# Patient Record
Sex: Female | Born: 1950 | Race: Black or African American | Hispanic: No | Marital: Married | Smoking: Never smoker
Health system: Southern US, Community
[De-identification: ages and names within clinical notes are randomized; demographics above are authoritative.]

## PROBLEM LIST (undated history)

## (undated) DIAGNOSIS — R5383 Other fatigue: Secondary | ICD-10-CM

## (undated) DIAGNOSIS — Z634 Disappearance and death of family member: Secondary | ICD-10-CM

## (undated) DIAGNOSIS — M2550 Pain in unspecified joint: Secondary | ICD-10-CM

## (undated) DIAGNOSIS — M549 Dorsalgia, unspecified: Secondary | ICD-10-CM

## (undated) DIAGNOSIS — E785 Hyperlipidemia, unspecified: Secondary | ICD-10-CM

## (undated) DIAGNOSIS — I10 Essential (primary) hypertension: Secondary | ICD-10-CM

## (undated) DIAGNOSIS — I2510 Atherosclerotic heart disease of native coronary artery without angina pectoris: Secondary | ICD-10-CM

## (undated) DIAGNOSIS — E119 Type 2 diabetes mellitus without complications: Secondary | ICD-10-CM

## (undated) DIAGNOSIS — M1990 Unspecified osteoarthritis, unspecified site: Secondary | ICD-10-CM

## (undated) DIAGNOSIS — E079 Disorder of thyroid, unspecified: Secondary | ICD-10-CM

## (undated) DIAGNOSIS — R0602 Shortness of breath: Secondary | ICD-10-CM

## (undated) DIAGNOSIS — E039 Hypothyroidism, unspecified: Secondary | ICD-10-CM

## (undated) HISTORY — DX: Atherosclerotic heart disease of native coronary artery without angina pectoris: I25.10

## (undated) HISTORY — DX: Disorder of thyroid, unspecified: E07.9

## (undated) HISTORY — DX: Pain in unspecified joint: M25.50

## (undated) HISTORY — DX: Hyperlipidemia, unspecified: E78.5

## (undated) HISTORY — DX: Shortness of breath: R06.02

## (undated) HISTORY — DX: Other fatigue: R53.83

## (undated) HISTORY — DX: Essential (primary) hypertension: I10

## (undated) HISTORY — DX: Dorsalgia, unspecified: M54.9

## (undated) HISTORY — DX: Disappearance and death of family member: Z63.4

## (undated) HISTORY — DX: Unspecified osteoarthritis, unspecified site: M19.90

## (undated) HISTORY — DX: Hypothyroidism, unspecified: E03.9

## (undated) HISTORY — PX: CARDIAC CATHETERIZATION: SHX172

## (undated) HISTORY — PX: THYROIDECTOMY: SHX17

## (undated) HISTORY — PX: CHOLECYSTECTOMY: SHX55

---

## 1972-12-20 HISTORY — PX: APPENDECTOMY: SHX54

## 1981-12-20 DIAGNOSIS — Z634 Disappearance and death of family member: Secondary | ICD-10-CM

## 1981-12-20 HISTORY — DX: Disappearance and death of family member: Z63.4

## 1985-12-20 HISTORY — PX: LAPAROSCOPIC TUBAL LIGATION: SUR803

## 1999-11-19 ENCOUNTER — Other Ambulatory Visit: Admission: RE | Admit: 1999-11-19 | Discharge: 1999-11-19 | Payer: Self-pay | Admitting: Internal Medicine

## 2001-09-14 ENCOUNTER — Encounter: Payer: Self-pay | Admitting: Emergency Medicine

## 2001-09-14 ENCOUNTER — Encounter: Payer: Self-pay | Admitting: Cardiology

## 2001-09-14 ENCOUNTER — Inpatient Hospital Stay (HOSPITAL_COMMUNITY): Admission: EM | Admit: 2001-09-14 | Discharge: 2001-09-15 | Payer: Self-pay | Admitting: Emergency Medicine

## 2001-09-18 ENCOUNTER — Encounter: Payer: Self-pay | Admitting: Cardiology

## 2001-09-18 ENCOUNTER — Ambulatory Visit (HOSPITAL_COMMUNITY): Admission: RE | Admit: 2001-09-18 | Discharge: 2001-09-18 | Payer: Self-pay | Admitting: Cardiology

## 2002-11-10 ENCOUNTER — Encounter: Payer: Self-pay | Admitting: Emergency Medicine

## 2002-11-10 ENCOUNTER — Inpatient Hospital Stay (HOSPITAL_COMMUNITY): Admission: EM | Admit: 2002-11-10 | Discharge: 2002-11-12 | Payer: Self-pay | Admitting: Emergency Medicine

## 2002-11-11 ENCOUNTER — Encounter (INDEPENDENT_AMBULATORY_CARE_PROVIDER_SITE_OTHER): Payer: Self-pay | Admitting: *Deleted

## 2002-11-11 ENCOUNTER — Encounter: Payer: Self-pay | Admitting: General Surgery

## 2003-07-11 ENCOUNTER — Encounter: Payer: Self-pay | Admitting: Emergency Medicine

## 2003-07-11 ENCOUNTER — Emergency Department (HOSPITAL_COMMUNITY): Admission: AD | Admit: 2003-07-11 | Discharge: 2003-07-11 | Payer: Self-pay | Admitting: Emergency Medicine

## 2004-12-09 ENCOUNTER — Encounter: Admission: RE | Admit: 2004-12-09 | Discharge: 2004-12-09 | Payer: Self-pay | Admitting: Internal Medicine

## 2006-12-07 ENCOUNTER — Ambulatory Visit (HOSPITAL_COMMUNITY): Admission: RE | Admit: 2006-12-07 | Discharge: 2006-12-07 | Payer: Self-pay | Admitting: Emergency Medicine

## 2007-03-18 ENCOUNTER — Emergency Department (HOSPITAL_COMMUNITY): Admission: EM | Admit: 2007-03-18 | Discharge: 2007-03-18 | Payer: Self-pay | Admitting: Emergency Medicine

## 2007-03-18 ENCOUNTER — Ambulatory Visit (HOSPITAL_COMMUNITY): Admission: RE | Admit: 2007-03-18 | Discharge: 2007-03-18 | Payer: Self-pay | Admitting: Emergency Medicine

## 2007-06-24 ENCOUNTER — Inpatient Hospital Stay (HOSPITAL_COMMUNITY): Admission: EM | Admit: 2007-06-24 | Discharge: 2007-06-25 | Payer: Self-pay | Admitting: Emergency Medicine

## 2010-02-02 ENCOUNTER — Emergency Department (HOSPITAL_COMMUNITY): Admission: EM | Admit: 2010-02-02 | Discharge: 2010-02-02 | Payer: Self-pay | Admitting: Emergency Medicine

## 2010-03-25 ENCOUNTER — Ambulatory Visit (HOSPITAL_COMMUNITY): Admission: RE | Admit: 2010-03-25 | Discharge: 2010-03-25 | Payer: Self-pay | Admitting: Gastroenterology

## 2010-10-22 ENCOUNTER — Emergency Department (HOSPITAL_COMMUNITY): Admission: EM | Admit: 2010-10-22 | Discharge: 2010-10-22 | Payer: Self-pay | Admitting: Emergency Medicine

## 2010-12-24 LAB — COMPREHENSIVE METABOLIC PANEL
ALT: 22 U/L (ref 0–35)
AST: 19 U/L (ref 0–37)
Albumin: 4.2 g/dL (ref 3.5–5.2)
Alkaline Phosphatase: 88 U/L (ref 39–117)
BUN: 9 mg/dL (ref 6–23)
CO2: 30 mEq/L (ref 19–32)
Calcium: 9.7 mg/dL (ref 8.4–10.5)
Chloride: 99 mEq/L (ref 96–112)
Creatinine, Ser: 0.69 mg/dL (ref 0.4–1.2)
GFR calc Af Amer: >60 mL/min (ref >60)
GFR calc non Af Amer: >60 mL/min (ref >60)
Glucose, Bld: 101 mg/dL — ABNORMAL HIGH (ref 70–99)
Potassium: 3.6 mEq/L (ref 3.5–5.1)
Sodium: 137 mEq/L (ref 135–145)
Total Bilirubin: 0.8 mg/dL (ref 0.3–1.2)
Total Protein: 7.3 g/dL (ref 6.0–8.3)

## 2010-12-24 LAB — DIFFERENTIAL
Basophils Absolute: 0 10*3/uL (ref 0.0–0.1)
Basophils Relative: 0 % (ref 0–1)
Eosinophils Absolute: 0 10*3/uL (ref 0.0–0.7)
Eosinophils Relative: 1 % (ref 0–5)
Lymphocytes Relative: 54 % — ABNORMAL HIGH (ref 12–46)
Lymphs Abs: 2.7 10*3/uL (ref 0.7–4.0)
Monocytes Absolute: 0.4 10*3/uL (ref 0.1–1.0)
Monocytes Relative: 9 % (ref 3–12)
Neutro Abs: 1.8 10*3/uL (ref 1.7–7.7)
Neutrophils Relative %: 36 % — ABNORMAL LOW (ref 43–77)

## 2010-12-24 LAB — URINE MICROSCOPIC-ADD ON

## 2010-12-24 LAB — CBC
HCT: 36.8 % (ref 36.0–46.0)
Hemoglobin: 12.1 g/dL (ref 12.0–15.0)
MCH: 29.4 pg (ref 26.0–34.0)
MCHC: 32.9 g/dL (ref 30.0–36.0)
MCV: 89.5 fL (ref 78.0–100.0)
Platelets: 362 10*3/uL (ref 150–400)
RBC: 4.11 MIL/uL (ref 3.87–5.11)
RDW: 13.6 % (ref 11.5–15.5)
WBC: 4.9 10*3/uL (ref 4.0–10.5)

## 2010-12-24 LAB — PROTIME-INR
INR: 1.05 (ref 0.00–1.49)
Prothrombin Time: 13.9 seconds (ref 11.6–15.2)

## 2010-12-24 LAB — URINALYSIS, ROUTINE W REFLEX MICROSCOPIC
Bilirubin Urine: NEGATIVE
Ketones, ur: NEGATIVE mg/dL
Leukocytes, UA: NEGATIVE
Nitrite: NEGATIVE
Protein, ur: NEGATIVE mg/dL
Specific Gravity, Urine: 1.014 (ref 1.005–1.030)
Urine Glucose, Fasting: NEGATIVE mg/dL
Urobilinogen, UA: 1 mg/dL (ref 0.0–1.0)
pH: 6 (ref 5.0–8.0)

## 2010-12-28 ENCOUNTER — Ambulatory Visit (HOSPITAL_COMMUNITY)
Admission: RE | Admit: 2010-12-28 | Discharge: 2010-12-29 | Payer: Self-pay | Source: Home / Self Care | Attending: Surgery | Admitting: Surgery

## 2010-12-28 ENCOUNTER — Encounter (INDEPENDENT_AMBULATORY_CARE_PROVIDER_SITE_OTHER): Payer: Self-pay | Admitting: Surgery

## 2011-01-04 LAB — CALCIUM
Calcium: 9 mg/dL (ref 8.4–10.5)
Calcium: 9 mg/dL (ref 8.4–10.5)

## 2011-01-10 NOTE — Op Note (Signed)
NAMEMarland Kitchen  Deborah [name], Deborah [name] NO.:  [id number]  MEDICAL RECORD NO.:  [id number]          PATIENT TYPE:  OIB  LOCATION:  5121                         FACILITY:  [facility]  PHYSICIAN:  Velora Heckler, MD      DATE OF BIRTH:  1951-07-07  DATE OF PROCEDURE:  12/28/2010 DATE OF DISCHARGE:                              OPERATIVE REPORT   PREOPERATIVE DIAGNOSIS:  Substernal thyroid goiter with compressive symptoms.  POSTOPERATIVE DIAGNOSIS:  Substernal thyroid goiter with compressive symptoms.  PROCEDURE:  Total thyroidectomy.  SURGEON:  Velora Heckler, MD  ASSISTANT:  Anselm Pancoast. Zachery Dakins, MD  ANESTHESIA:  General endotracheal per Dr. Claybon Jabs.  ESTIMATED BLOOD LOSS:  Minimal.  PREPARATION:  ChloraPrep.  COMPLICATIONS:  None.  INDICATIONS:  The patient is a 60 year old black female from [place], [place].  She was diagnosed by Dr. Andi Devon with substernal thyroid goiter with compressive symptoms.  The patient has had exertional dyspnea.  CT scan of the neck and chest showed a large substernal goiter in the left thyroid lobe extending approximately 9 cm and displacing the trachea to the right.  The patient was evaluated by General Surgery and by Anesthesia preoperatively.  She now comes to surgery for thyroidectomy.  BODY OF REPORT:  Procedure was done in OR #11 at the [facility].  The patient was brought to the operating room, placed in a supine position on the operating room table.  Following administration of general anesthesia, the patient was positioned and then prepped and draped in usual strict aseptic fashion.  After ascertaining that an adequate level of anesthesia had been achieved, a Kocher incision was made with a #10 blade.  Dissection was carried through subcutaneous tissues and platysma.  Hemostasis was obtained with electrocautery.  External jugular vein was divided between hemostats and ligated with 2-0 silk  ties.  Subplatysmal flaps were elevated cephalad and caudad from the thyroid notch to the sternal notch.  A Mahorner self- retaining retractor was placed for exposure.  Strap muscles were incised in the midline and dissection was begun on the left side.  Strap muscles were reflected laterally exposing a markedly enlarged left thyroid lobe. Lobe was gently mobilized with blunt dissection.  Venous tributaries were divided between medium Ligaclips with a harmonic scalpel.  Gland was rolled anteriorly.  Superior pole was dissected out and superior pole vessels were divided individually between medium and small Ligaclips with the harmonic scalpel.  Gland is was further anteriorly and inferior venous tributaries were divided between medium Ligaclips with the harmonic scalpel.  Care was taken to identify and preserve the parathyroid glands on the left.  Branches of the inferior thyroid artery were divided between small and medium Ligaclips with the harmonic scalpel.  Recurrent laryngeal nerve was identified and preserved.  Gland was mobilized down to the trachea.  The ligament of Allyson Sabal was transected with electrocautery and the gland was mobilized up and onto the anterior trachea.  The trachea was deformed by the enlarged thyroid gland. However, the cartilaginous rings appeared to be present and structurally sound.  There was no significant pyramidal lobe.  The  isthmus was mobilized across the midline with electrocautery.  A dry pack was placed in the left neck.  Next, we turned our attention to the right side.  Right strap muscles were reflected off the right thyroid lobe, which was in the upper limits of normal size.  No dominant nor discrete nodules.  Right lobe was fully exposed.  Venous tributaries were divided between medium Ligaclips with the harmonic scalpel. Superior pole was dissected out and superior pole vessels were divided between small and medium Ligaclips with the harmonic  scalpel.  Superior parathyroid gland was identified and preserved.  Inferior venous tributaries were divided between Ligaclips with harmonic scalpel.  The inferior parathyroid gland on the right was identified and preserved. Branches of the inferior thyroid artery were divided between small Ligaclips.  Recurrent nerve was identified and preserved.  Ligament of Allyson Sabal was released with the electrocautery taking care to avoid the recurrent nerve.  Gland was then resected from the anterior trachea with the electrocautery.  Suture was used to mark the right superior pole. The entire thyroid gland was submitted to Pathology for review.  Neck was irrigated with warm saline.  Good hemostasis was achieved bilaterally.  Surgicel was placed in the operative field bilaterally. Strap muscles were reapproximated in the midline with interrupted 3-0 Vicryl sutures.  Platysma muscle was closed with interrupted 3-0 Vicryl sutures.  Skin was closed with a running 4-0 Monocryl subcuticular suture.  Wound was washed and dried and Benzoin and Steri-Strips were applied.  Sterile dressings were applied.  The patient was awakened from anesthesia and brought to the recovery room.  The patient tolerated the procedure well.     Velora Heckler, MD     TMG/MEDQ  D:  12/28/2010  T:  12/29/2010  Job:  [id number]  cc:   Merlene Laughter. Renae Gloss, M.D.  Electronically Signed by Darnell Level MD on 01/10/2011 09:57:47 AM

## 2011-01-10 NOTE — Discharge Summary (Signed)
  NAMEMarland [name]  ROYLENE, HEATON NO.:  [id number]  MEDICAL RECORD NO.:  [id number]          PATIENT TYPE:  OIB  LOCATION:  5121                         FACILITY:  [facility]  PHYSICIAN:  Velora Heckler, MD      DATE OF BIRTH:  04-08-1951  DATE OF ADMISSION:  12/28/2010 DATE OF DISCHARGE:  12/29/2010                              DISCHARGE SUMMARY   REASON FOR ADMISSION:  Thyroid goiter with compressive symptoms.  BRIEF HISTORY:  The patient is a 60 year old black female from [place], [place].  She presents at the request of Dr. Mikeal Hawthorne with substernal thyroid goiter and shortness of breath. Evaluation showed a large thyroid goiter on the left, extending into the anterior mediastinum with displacement of the trachea to the right.  The patient now comes to surgery for resection.  HOSPITAL COURSE:  The patient was admitted on the surgical service and taken directly to the operating room.  She underwent total thyroidectomy.  Postoperative course was straightforward.  Serum calcium levels remained stable.  Voice quality was good.  The patient was observed overnight.  She tolerated a diet.  She was prepared for discharge home on the first postoperative day.  DISCHARGE PLANNING:  The patient is discharged home on December 29, 2010, in good condition, tolerating a regular diet, and ambulating independently.  Discharge medications include Vicodin as needed for pain.  The patient will start on Synthroid 100 mcg daily.  She will also take two Tums three times daily for 2 weeks.  The patient will be seen back in my office at [facility] in 2 weeks.  We will check a calcium level prior to her office visit.  FINAL DIAGNOSIS:  Substernal thyroid goiter with compressive symptoms, final pathology pending at the time of discharge.  CONDITION AT DISCHARGE:  Good.     Velora Heckler, MD     TMG/MEDQ  D:  12/29/2010  T:  12/29/2010  Job:  [id number]  cc:    Merlene Laughter. Renae Gloss, M.D.  Electronically Signed by Darnell Level MD on 01/10/2011 09:57:42 AM

## 2011-01-14 LAB — SURGICAL PCR SCREEN
MRSA, PCR: NEGATIVE
Staphylococcus aureus: NEGATIVE

## 2011-03-03 LAB — POCT I-STAT, CHEM 8
BUN: 11 mg/dL (ref 6–23)
Calcium, Ion: 1.16 mmol/L (ref 1.12–1.32)
Chloride: 103 mEq/L (ref 96–112)
Creatinine, Ser: 0.8 mg/dL (ref 0.4–1.2)
Glucose, Bld: 111 mg/dL — ABNORMAL HIGH (ref 70–99)
HCT: 38 % (ref 36.0–46.0)
Hemoglobin: 12.9 g/dL (ref 12.0–15.0)
Potassium: 3.4 mEq/L — ABNORMAL LOW (ref 3.5–5.1)
Sodium: 140 mEq/L (ref 135–145)
TCO2: 28 mmol/L (ref 0–100)

## 2011-03-03 LAB — GLUCOSE, CAPILLARY: Glucose-Capillary: 115 mg/dL — ABNORMAL HIGH (ref 70–99)

## 2011-03-12 LAB — CBC
HCT: 33.4 % — ABNORMAL LOW (ref 36.0–46.0)
Hemoglobin: 11.4 g/dL — ABNORMAL LOW (ref 12.0–15.0)
RBC: 3.77 MIL/uL — ABNORMAL LOW (ref 3.87–5.11)
RDW: 14.2 % (ref 11.5–15.5)

## 2011-03-12 LAB — DIFFERENTIAL
Eosinophils Relative: 0 % (ref 0–5)
Lymphocytes Relative: 25 % (ref 12–46)
Lymphs Abs: 1.8 10*3/uL (ref 0.7–4.0)
Neutro Abs: 4.8 10*3/uL (ref 1.7–7.7)

## 2011-03-12 LAB — PROTIME-INR
INR: 1.05 (ref 0.00–1.49)
Prothrombin Time: 13.6 seconds (ref 11.6–15.2)

## 2011-03-12 LAB — CK TOTAL AND CKMB (NOT AT ARMC): Relative Index: 1.1 (ref 0.0–2.5)

## 2011-03-12 LAB — BASIC METABOLIC PANEL
CO2: 27 mEq/L (ref 19–32)
GFR calc non Af Amer: >60 mL/min (ref >60)
Glucose, Bld: 124 mg/dL — ABNORMAL HIGH (ref 70–99)
Potassium: 3.2 mEq/L — ABNORMAL LOW (ref 3.5–5.1)
Sodium: 132 mEq/L — ABNORMAL LOW (ref 135–145)

## 2011-03-12 LAB — TROPONIN I: Troponin I: 0.02 ng/mL (ref 0.00–0.06)

## 2011-03-12 LAB — GLUCOSE, CAPILLARY: Glucose-Capillary: 125 mg/dL — ABNORMAL HIGH (ref 70–99)

## 2011-03-12 LAB — APTT: aPTT: 25 seconds (ref 24–37)

## 2011-03-12 LAB — D-DIMER, QUANTITATIVE: D-Dimer, Quant: <0.22 ug/mL-FEU (ref 0.00–0.48)

## 2011-05-04 NOTE — Discharge Summary (Signed)
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  She came in to the emergency  room at about 2:30 p.m. with complaints of sharp pain in the left breast  through to her back, left trapezius and neck.  She had no numbness or  tingling in her fingers.  Most of her discomfort was in her upper arm.  She had no relief of pain after sublingual nitroglycerin.  Also gave her  morphine without any relief.  She recently was in the ER trying to help  and she was pushing several stretchers.  She has never had this  discomfort before.  She does have a family history of early coronary  disease with her mother having a MI in her early 25s, and her father  having an active MI.  She was seen by Dr. Elsie Lincoln who decided to admit  her and rule her out.  The following day, on June 25, 2007, her CK MB and  troponin were all negative.  Her EKG was unremarkable except for non-  specific T wave abnormalities.  We did order cervical spine films,  however, they had not been done as of June 25, 2007.  These are pending.  She should have them the morning of her discharge.  Her blood pressure  is 125/74, heart rate was 76.  She was sinus rhythm 83.  We had given  her some Toradol.  She states that is the medication that helped her  discomfort.  It was felt that her discomfort was musculoskeletal.  Her  discharge is pending results of the cervical spine film.  Dr. Elsie Lincoln  feels she can return to work after being pain free for at least 24 hours  and she is to do no pushing or lifting for 5 days after  she has been  pain free.   DISCHARGE MEDICATIONS:  1. Celebrex 200 mg a day for five days.  2. Diovan 320/25 a day.  3. Tekturna 150 mg a day.  4. Enteric-coated aspirin 81 mg a day.  5. Crestor 10 mg a day.   She can return to work after being pain free at least 24 hours.  No  pushing or pulling for 5 days after pain is gone.  She will need fasting  lipids and LFTs checked in 2 months after starting her Crestor.   LABORATORY DATA:  Sodium 138, potassium 3.6, BUN 10.  Her total  cholesterol is 176, LDL is 131, HDL is 33, her triglycerides are 61.  D-  dimer was 0.26.  TSH is 1.990.   DISCHARGE DIAGNOSES:  1. Musculoskeletal pain.  2. Dyslipidemia with low HDL and high LDL.  3. Premature family  history for heart disease.  4. Hypertension.   She should follow up with her primary care doctor.  Follow up with Dr.  Elsie Lincoln on a p.r.n. basis.      [name], N.P.    ______________________________  [name], M.D.    BB/MEDQ  D:  06/25/2007  T:  06/25/2007  Job:  [id number]

## 2011-05-07 NOTE — Cardiovascular Report (Signed)
[place]  Patient:    Deborah [name], Deborah [name] Visit Number: [id number] MRN: [id number]          Service Type: MED Location: [phone] Attending Physician:  Ophelia Shoulder Dictated by:   [name], M.D. Proc. Date: 09/15/01 Admit Date:  09/14/2001                          Cardiac Catheterization  PROCEDURES PERFORMED: 1. Selective coronary angiography by Judkins technique. 2. Retrograde left heart catheterization. 3. Left ventricular angiography. 4. Abdominal aortography.  COMPLICATIONS: None.  ENTRY SITE: Right femoral.  DYE USED: Omnipaque.  PATIENT PROFILE: The patient is a 60 year old, African-American, married, female, who is a Armed forces technical officer at [employer].  She has a family history of coronary artery disease and has had previous chest pain with a normal catheterization.  Recently, she had a throat and neck discomfort that was alarming to her and presented on September 14, 2001, for admission. Cardiac enzymes and ECGs have been negative, however. Actually, her ECG did show some nonspecific ST-T abnormalities.  Todays inpatient cardiac catheterization is done electively.  RESULTS:  PRESSURES: The left ventricular pressure was 120/10, central aortic pressure 120/70, mean of 90.  No aortic valve gradient by pullback technique.  ANGIOGRAPHIC RESULTS: The left main coronary artery is a short vessel that is virtually a common ostium to the circumflex and LAD.  No lesions are seen. The LAD is a normal vessel giving rise to one medium sized diagonal branch arising before the first septal perforator branch and a second tiny diagonal branch also arising near the first septal perforator branch.  No lesions are seen in the two diagonals over the LAD.  The circumflex coronary artery gives rise to a very large obtuse marginal branch.  The circumflex itself coursed though the atrial ventricular  groove and terminates as a posterior descending branch.  No lesions are seen.  There is an intermediate ramus branch, which is fairly small which is also normal.  The right coronary artery is intubated by using a No Torque right diagnostic catheter.  It has a shepherds Office manager. It is a small nondominant vessel that is normal.  LEFT VENTRICULAR ANGIOGRAM:  Left ventricular angiogram shows normal contractility of all wall segments. No LV thrombus and no evidence of prolapse or mitral regurgitation.  The abdominal aortogram shows that her renal arteries are normal (the patient has hypertension).  The abdominal aorta is smooth and normal.  FINAL DIAGNOSES: 1. Angiographically patent and normal coronary arteries. 2. Normal left ventricular systolic function. 3. No evidence of pathology in the abdominal aorta or the renal arteries.  PLAN: Reassurance. The patient should have GI work-up if the patients symptoms return. Dictated by:   [name], M.D. Attending Physician:  Ophelia Shoulder DD:  09/15/01 TD:  09/15/01 Job: [id number]

## 2011-05-07 NOTE — Op Note (Signed)
NAME:  PEDRO, OLDENBURG                      ACCOUNT NO.:  [id number]   MEDICAL RECORD NO.:  [id number]                   PATIENT TYPE:  INP   LOCATION:  5702                                 FACILITY:  [facility]   PHYSICIAN:  Ollen Gross. Vernell Morgans, M.D.              DATE OF BIRTH:  [birth year]   DATE OF PROCEDURE:  11/11/2002  DATE OF DISCHARGE:  11/12/2002                                 OPERATIVE REPORT   PREOPERATIVE DIAGNOSIS:  Cholelithiasis.   POSTOPERATIVE DIAGNOSIS:  Cholelithiasis.   PROCEDURE:  Laparoscopic cholecystectomy with intraoperative cholangiogram.   SURGEON:  Ollen Gross. Carolynne Edouard, M.D.   ASSISTANT:  Jimmye Norman, M.D.   ANESTHESIA:  General endotracheal.   DESCRIPTION OF PROCEDURE:  After informed consent was obtained, the patient  was brought to the operating room and placed in the supine position upon the  operating table.  After adequate induction of general anesthesia, the  patient's abdomen was prepped with Betadine and draped in the usual sterile  manner.  The area below the umbilicus was infiltrated with 0.25% Marcaine  and a small incision was made with a 15 blade knife.  This incision was  carried down through the subcutaneous tissue bluntly using a Kelly clamp and  Army-Navy retractors until the linea alba was identified.  The linea alba  was then incised with a 15 blade knife and each side was grasped with Kocher  clamps and elevated anteriorly.  The preperitoneal space was then probed  bluntly with a hemostat until the peritoneum was opened and access was  gained to the abdominal cavity.  A 0 Vicryl pursestring stitch was then  placed in the fascia surrounding this opening.  A Hasson cannula was placed  through this opening and anchored in place with the previously-placed Vicryl  pursestring stitch.  The abdomen was the insufflated with carbon dioxide  without difficulty.  The patient was placed in a head-up position and the  laparoscope was placed through the  Hasson cannula.  The dome of the  gallbladder and liver edge were readily identifiable.  Next, after  infiltrating the area with 0.25% Marcaine, a small upper midline transverse  incision was made with the 15 blade knife.  A 10 mm port was placed bluntly  through this incision into the abdominal cavity under direct vision.  Next  sites were chosen laterally on the right side of the abdomen for placement  of 5 mm ports.  Each of these areas was infiltrated with 0.25% Marcaine and  small stab incisions were made with the 15 blade knife.  Five millimeter  ports were then placed bluntly through these incisions into the abdominal  cavity under direct vision.  A blunt grasper was placed through the  lateralmost 5 mm port and used to grasp the dome of the gallbladder and  elevate it anteriorly and superiorly.  Another blunt grasper was placed  through the other 5 mm port  and used to retract on the body and neck of the  gallbladder.  The dissector was placed through the upper midline port and  using electrocautery, the peritoneal reflection at the gallbladder neck was  opened.  Blunt dissection was then carried out in this area until the  gallbladder neck-cystic duct junction was readily identified and a good  window was created.  A clip was placed on the gallbladder neck.  A 12-gauge  Angiocath was then placed percutaneously through the anterior abdominal  wall, and a Reddick cholangiogram catheter was placed through the Angiocath  and flushed.  A small ductotomy was made on the cystic duct just proximal to  the clip, and the catheter was placed within the cystic duct and anchored in  place with the clip.  The cholangiogram was obtained, and it showed no  filling defects and good emptying into the duodenum.  The anchoring clip and  catheters were then removed from the patient.  Three clips were placed  proximally on the cystic duct and the duct was divided between the two sets  of clips.   Posterior to this the cystic artery was identified and again  dissected in a circumferential manner bluntly until a good window was  created.  Two clips were placed proximally and one distally on the artery,  and the artery was divided between the two.  Next a hook cautery device was  used to separate the gallbladder from the liver bed.  Prior to completely  detaching the gallbladder from the liver bed, the liver bed was inspected  and several bleeding points were coagulated with the electrocautery until  the liver bed was completely hemostatic.  The gallbladder was then detached  the rest of the way from the liver bed.  The laparoscope was then moved to  the upper midline port and a gallbladder grasper was placed through the  Hasson cannula and used to grasp the neck of the gallbladder.  The  gallbladder was then removed with the Hasson cannula through the  infraumbilical port.  The fascial defect was closed with the previously-  placed Vicryl pursestring stitch.  The abdomen was then irrigated with  copious amounts of saline until the effluent was clear.  The ports were then  all removed under direct vision and were found to be hemostatic.  The gas  was allowed to escape, and the skin incisions were closed with interrupted 4-  0 Monocryl subcuticular stitches, Benzoin and Steri-Strips were applied, and  the patient tolerated the procedure well.  At the end of the case all  needle, sponge, and instrument counts were correct.  The patient was  awakened and taken to the recovery room in stable condition.                                               Ollen Gross. Vernell Morgans, M.D.    PST/MEDQ  D:  11/12/2002  T:  11/13/2002  Job:  [id number]

## 2011-05-07 NOTE — Discharge Summary (Signed)
[place]. [facility]  Patient:    Deborah [name], Deborah [name] Visit Number: [id number] MRN: [id number]          Service Type: MED Location: [id number] Attending Physician:  Ophelia Shoulder Dictated by:   [facility]. Admit Date:  09/14/2001 Discharge Date: 09/15/2001                             Discharge Summary  ADMISSION DIAGNOSES: 1. Chest pain, questionable etiology, possible unstable angina. 2. History of cardiac catheterization 1994 which was normal. 3. Hypertension. 4. Chronic bronchitis. 5. Remote history of appendectomy. 6. History of tubal ligation.  DISCHARGE DIAGNOSES: 1. Chest pain, questionable etiology, noncardiac in origin. 2. History of cardiac catheterization 1994 which was normal. 3. Hypertension. 4. Chronic bronchitis. 5. Remote history of appendectomy. 6. History of tubal ligation. 7. Status post cardiac catheterization 09/15/01, by Dr. Chanda Busing which    revealed normal coronary arteries, normal left ventricular function.  HISTORY OF PRESENT ILLNESS:  Ms. Deborah [name] is a 60 year old African-American female, who presented to [facility] ER on 09/14/01, with complaints of right-sided chest pressure, radiating through her chest to the back.  As well, she has a history of complaints of headache.  She states that she was awakened at 4:00 that morning with chest pain, shortness of breath, slight nausea, and a feeling as if her heart was racing.  She did not take any medications.  She rested and thought that it would get better but eventually decided to come to the emergency room.  In the ER, she was given 3 sublingual nitroglycerins, four injections of IV morphine which have not relieved her pain.  On exam she was afebrile with a pulse of 98 to 120, blood pressure 154/83.  There were no significant findings on exam.  EKG showed sinus rhythm, nonspecific changes, with very mild inferolateral ST changes that may  represent ischemia.  First set of cardiac enzymes negative.  LFTs were normal.  Other labs were stable.  HOSPITAL COURSE:  On 09/14/01, she underwent spiral CT which showed no pulmonary embolus.  Later on that night, she continued to complain of nausea, feeling faint and diaphoretic.  Had complaints of "feeling hot inside" and "feeling like something is stuck in my throat."  Later on the morning of 09/15/01, at the time of our interview, she stated that she had been awakened earlier that morning with increased shortness of breath, clamminess, and pressure in her chest that moved up towards her throat.  However, at the time of our rounds at 7:30 a.m., she was much improved.  She was stable and was afebrile at 98.9, pulse 72, blood pressure 115/60.  EKG today continued to show sinus rhythm and is actually improved from yesterday and shows nonspecific changes.  Her cardiac enzymes are negative x 3.  She was planned for cardiac cath later in the day.  On 09/15/01, Ms. Deborah [name] underwent cardiac catheterization by Dr. Lavonne Chick.  Her coronaries were normal.  LV function was normal. Abdominal aorta normal.  At that time, he felt that from a cardiac standpoint, she was stable for discharge home later in the day after bed rest complete. He felt that she may need GI evaluation which could be performed as an outpatient if she wished or we could keep her in the hospital for consultation.  Later in the day, Ms. Deborah [name] is improved and wishes to go home and  have outpatient GI evaluation.  We spoke with Dr. Elsie Lincoln regarding this and decided to schedule her for a follow-up abdominal ultrasound and abdominal series on this upcoming Monday.  Prior to discharge home, she is stable with blood pressure 108/64, heart rate in the 70s-80s.  Her right groin is well-healed with no bleed, no hematoma, and she has 2+ peripheral pulse.  She is now deemed stable for discharge home.  HOSPITAL CONSULTS:   None.  HOSPITAL PROCEDURES:  Cardiac catheterization on 09/15/01 by Dr. Lavonne Chick. Please see his dictated cath report for further details which were unavailable at this time.  Cardiac cath revealed normal coronary arteries, normal LV function, and normal abdominal aorta.  LABORATORY DATA:  Cardiac enzymes negative x 3 with CKs of 128, 98, and 86. MBs 0.8, 0.4, and less than .3.  Troponin .01, less than .01, and less than 0.1.  On 09/14/01. WBC 6/5, hemoglobin 12.1, hematocrit 36.5, platelets 381. Sodium 133, potassium 3.1 which was repleted, BUN 12, creatinine 0.7, glucose 158.  PT 13.0, INR 1.0, PTT 46.  LFTs were all normal.  On 09/15/01, WBC 6.3, hemoglobin 10.9, hematocrit 32.5, platelets 343, sodium 139, potassium 3.4 (she was given K-Dur 40 mEq after this value prior to going to cath), BUN 8, creatinine 0.6, glucose 145.  RADIOLOGY:  Chest CT 09/14/01 shows no evidence of acute pulmonary embolus.  EKG shows normal sinus rhythm, 70 beats per minute, nonspecific ST-T changes with very slight inferolateral ST depression on EKG 09/14/01, which was improved on EKG of 09/15/01.  DISCHARGE MEDICATIONS: 1. Hyzaar 100 mg once a day. 2. Vitamin E 400 IU a day.  ACTIVITY:  No strenuous activity, lifting greater than 5 pounds, driving, or sexual activity for three days.  DIET:  Low salt, low fat, low cholesterol diet.  WOUND CARE:  May gently wash her groin with warm water and soap.  Call the office at [phone] if you have any bleeding or increased size, or pain in the groin.  She has been made an appointment to follow up in the radiology department here at [facility] on Monday, September 30, at 10:30 a.m.  She is to be in admitting at 10:15 a.m.  She has been scheduled for gallbladder ultrasound and an abdominal series.  She has been informed of nothing to eat after midnight night before test.  She has an appointment follow-up with [name] with Dr. Elsie Lincoln in the office  on October 8, at 11:20 a.m. Dictated by:   [facility]. Attending Physician:  Ophelia Shoulder  DD:  09/15/01 TD:  09/15/01 Job: [phone] [id number]

## 2011-06-08 ENCOUNTER — Encounter (INDEPENDENT_AMBULATORY_CARE_PROVIDER_SITE_OTHER): Payer: Self-pay | Admitting: Surgery

## 2011-06-08 DIAGNOSIS — I10 Essential (primary) hypertension: Secondary | ICD-10-CM | POA: Insufficient documentation

## 2011-06-08 DIAGNOSIS — R0602 Shortness of breath: Secondary | ICD-10-CM | POA: Insufficient documentation

## 2011-06-08 DIAGNOSIS — Z780 Asymptomatic menopausal state: Secondary | ICD-10-CM | POA: Insufficient documentation

## 2011-06-08 DIAGNOSIS — R634 Abnormal weight loss: Secondary | ICD-10-CM | POA: Insufficient documentation

## 2011-06-08 DIAGNOSIS — R519 Headache, unspecified: Secondary | ICD-10-CM | POA: Insufficient documentation

## 2011-10-05 LAB — I-STAT 8, (EC8 V) (CONVERTED LAB)
Bicarbonate: 28.4 — ABNORMAL HIGH
HCT: 39
Hemoglobin: 13.3
Operator id: 151321
Sodium: 138
TCO2: 30
pCO2, Ven: 39.8 — ABNORMAL LOW

## 2011-10-05 LAB — TROPONIN I
Troponin I: 0.01
Troponin I: 0.01

## 2011-10-05 LAB — LIPID PANEL
Cholesterol: 176
HDL: 33 — ABNORMAL LOW

## 2011-10-05 LAB — APTT: aPTT: 27

## 2011-10-05 LAB — MAGNESIUM: Magnesium: 2

## 2011-10-05 LAB — POCT CARDIAC MARKERS
CKMB, poc: 1.5
CKMB, poc: 1.7
Myoglobin, poc: 53.1
Myoglobin, poc: 58.1
Operator id: 151321
Operator id: 291361
Troponin i, poc: <0.05

## 2011-10-05 LAB — URINALYSIS, ROUTINE W REFLEX MICROSCOPIC
Bilirubin Urine: NEGATIVE
Nitrite: NEGATIVE
Specific Gravity, Urine: 1.014
Urobilinogen, UA: 2 — ABNORMAL HIGH
pH: 7

## 2011-10-05 LAB — CK TOTAL AND CKMB (NOT AT ARMC)
CK, MB: 1.1
Relative Index: 1
Total CK: 106

## 2011-10-05 LAB — URINE MICROSCOPIC-ADD ON

## 2011-10-05 LAB — TSH: TSH: 1.99

## 2011-10-05 LAB — D-DIMER, QUANTITATIVE: D-Dimer, Quant: 0.26

## 2012-12-14 ENCOUNTER — Other Ambulatory Visit (HOSPITAL_COMMUNITY): Payer: Self-pay | Admitting: Internal Medicine

## 2012-12-14 DIAGNOSIS — R1011 Right upper quadrant pain: Secondary | ICD-10-CM

## 2012-12-18 ENCOUNTER — Ambulatory Visit (HOSPITAL_COMMUNITY)
Admission: RE | Admit: 2012-12-18 | Discharge: 2012-12-18 | Disposition: A | Discharge status: Home / Self Care | Payer: 59 | Source: Ambulatory Visit | Attending: Internal Medicine | Admitting: Internal Medicine

## 2012-12-18 ENCOUNTER — Encounter (HOSPITAL_COMMUNITY): Payer: Self-pay

## 2012-12-18 DIAGNOSIS — K439 Ventral hernia without obstruction or gangrene: Secondary | ICD-10-CM | POA: Insufficient documentation

## 2012-12-18 DIAGNOSIS — K5730 Diverticulosis of large intestine without perforation or abscess without bleeding: Secondary | ICD-10-CM | POA: Insufficient documentation

## 2012-12-18 DIAGNOSIS — K449 Diaphragmatic hernia without obstruction or gangrene: Secondary | ICD-10-CM | POA: Insufficient documentation

## 2012-12-18 DIAGNOSIS — R1011 Right upper quadrant pain: Secondary | ICD-10-CM | POA: Insufficient documentation

## 2012-12-18 MED ORDER — IOHEXOL 300 MG/ML  SOLN
100.0000 mL | Freq: Once | INTRAMUSCULAR | Status: AC | PRN
  Administered 2012-12-18: 100 mL via INTRAVENOUS

## 2014-09-06 ENCOUNTER — Encounter (HOSPITAL_COMMUNITY): Payer: Self-pay | Admitting: Emergency Medicine

## 2014-09-06 ENCOUNTER — Emergency Department (HOSPITAL_COMMUNITY)
Admission: EM | Admit: 2014-09-06 | Discharge: 2014-09-06 | Disposition: A | Discharge status: Home / Self Care | Payer: 59 | Source: Home / Self Care

## 2014-09-06 DIAGNOSIS — J45909 Unspecified asthma, uncomplicated: Secondary | ICD-10-CM

## 2014-09-06 DIAGNOSIS — J4520 Mild intermittent asthma, uncomplicated: Secondary | ICD-10-CM

## 2014-09-06 DIAGNOSIS — R058 Other specified cough: Secondary | ICD-10-CM

## 2014-09-06 DIAGNOSIS — J3089 Other allergic rhinitis: Secondary | ICD-10-CM

## 2014-09-06 DIAGNOSIS — R05 Cough: Secondary | ICD-10-CM

## 2014-09-06 DIAGNOSIS — R059 Cough, unspecified: Secondary | ICD-10-CM

## 2014-09-06 MED ORDER — TRIAMCINOLONE ACETONIDE 40 MG/ML IJ SUSP
60.0000 mg | Freq: Once | INTRAMUSCULAR | Status: AC
  Administered 2014-09-06: 60 mg via INTRAMUSCULAR

## 2014-09-06 MED ORDER — CHLORPHEN-PE-ACETAMINOPHEN 4-10-325 MG PO TABS: ORAL_TABLET | ORAL | Status: DC

## 2014-09-06 MED ORDER — ALBUTEROL SULFATE (2.5 MG/3ML) 0.083% IN NEBU
2.5000 mg | INHALATION_SOLUTION | Freq: Once | RESPIRATORY_TRACT | Status: AC
  Administered 2014-09-06: 2.5 mg via RESPIRATORY_TRACT

## 2014-09-06 MED ORDER — ALBUTEROL SULFATE HFA 108 (90 BASE) MCG/ACT IN AERS: 2.0000 | INHALATION_SPRAY | RESPIRATORY_TRACT | Status: DC | PRN

## 2014-09-06 MED ORDER — ALBUTEROL SULFATE (2.5 MG/3ML) 0.083% IN NEBU
INHALATION_SOLUTION | RESPIRATORY_TRACT | Status: AC
  Filled 2014-09-06: qty 3

## 2014-09-06 MED ORDER — TRIAMCINOLONE ACETONIDE 40 MG/ML IJ SUSP
INTRAMUSCULAR | Status: AC
  Filled 2014-09-06: qty 2

## 2014-09-06 MED ORDER — PREDNISONE 20 MG PO TABS: ORAL_TABLET | ORAL | Status: DC

## 2014-09-06 MED ORDER — IPRATROPIUM-ALBUTEROL 0.5-2.5 (3) MG/3ML IN SOLN
3.0000 mL | Freq: Once | RESPIRATORY_TRACT | Status: AC
  Administered 2014-09-06: 3 mL via RESPIRATORY_TRACT

## 2014-09-06 MED ORDER — IPRATROPIUM-ALBUTEROL 0.5-2.5 (3) MG/3ML IN SOLN
RESPIRATORY_TRACT | Status: AC
  Filled 2014-09-06: qty 3

## 2014-09-06 NOTE — Discharge Instructions (Signed)
Allergic Rhinitis Flonase nasal spray. Lots of saline nasal spray Ibuprofen for aches and pain Allergic rhinitis is when the mucous membranes in the nose respond to allergens. Allergens are particles in the air that cause your body to have an allergic reaction. This causes you to release allergic antibodies. Through a chain of events, these eventually cause you to release histamine into the blood stream. Although meant to protect the body, it is this release of histamine that causes your discomfort, such as frequent sneezing, congestion, and an itchy, runny nose.  CAUSES  Seasonal allergic rhinitis (hay fever) is caused by pollen allergens that may come from grasses, trees, and weeds. Year-round allergic rhinitis (perennial allergic rhinitis) is caused by allergens such as house dust mites, pet dander, and mold spores.  SYMPTOMS   Nasal stuffiness (congestion).  Itchy, runny nose with sneezing and tearing of the eyes. DIAGNOSIS  Your health care provider can help you determine the allergen or allergens that trigger your symptoms. If you and your health care provider are unable to determine the allergen, skin or blood testing may be used. TREATMENT  Allergic rhinitis does not have a cure, but it can be controlled by:  Medicines and allergy shots (immunotherapy).  Avoiding the allergen. Hay fever may often be treated with antihistamines in pill or nasal spray forms. Antihistamines block the effects of histamine. There are over-the-counter medicines that may help with nasal congestion and swelling around the eyes. Check with your health care provider before taking or giving this medicine.  If avoiding the allergen or the medicine prescribed do not work, there are many new medicines your health care provider can prescribe. Stronger medicine may be used if initial measures are ineffective. Desensitizing injections can be used if medicine and avoidance does not work. Desensitization is when a patient  is given ongoing shots until the body becomes less sensitive to the allergen. Make sure you follow up with your health care provider if problems continue. HOME CARE INSTRUCTIONS It is not possible to completely avoid allergens, but you can reduce your symptoms by taking steps to limit your exposure to them. It helps to know exactly what you are allergic to so that you can avoid your specific triggers. SEEK MEDICAL CARE IF:   You have a fever.  You develop a cough that does not stop easily (persistent).  You have shortness of breath.  You start wheezing.  Symptoms interfere with normal daily activities. Document Released: 08/31/2001 Document Revised: 12/11/2013 Document Reviewed: 08/13/2013 [facility] Patient Information 2015 [place], [place]. This information is not intended to replace advice given to you by your health care provider. Make sure you discuss any questions you have with your health care provider.  Bronchospasm A bronchospasm is when the tubes that carry air in and out of your lungs (airways) spasm or tighten. During a bronchospasm it is hard to breathe. This is because the airways get smaller. A bronchospasm can be triggered by:  Allergies. These may be to animals, pollen, food, or mold.  Infection. This is a common cause of bronchospasm.  Exercise.  Irritants. These include pollution, cigarette smoke, strong odors, aerosol sprays, and paint fumes.  Weather changes.  Stress.  Being emotional. HOME CARE   Always have a plan for getting help. Know when to call your doctor and local emergency services (911 in the [place] Know where you can get emergency care.  Only take medicines as told by your doctor.  If you were prescribed an inhaler or nebulizer  machine, ask your doctor how to use it correctly. Always use a spacer with your inhaler if you were given one.  Stay calm during an attack. Try to relax and breathe more slowly.  Control your home environment:  Change  your heating and air conditioning filter at least once a month.  Limit your use of fireplaces and wood stoves.  Do not  smoke. Do not  allow smoking in your home.  Avoid perfumes and fragrances.  Get rid of pests (such as roaches and mice) and their droppings.  Throw away plants if you see mold on them.  Keep your house clean and dust free.  Replace carpet with wood, tile, or vinyl flooring. Carpet can trap dander and dust.  Use allergy-proof pillows, mattress covers, and box spring covers.  Wash bed sheets and blankets every week in hot water. Dry them in a dryer.  Use blankets that are made of polyester or cotton.  Wash hands frequently. GET HELP IF:  You have muscle aches.  You have chest pain.  The thick spit you spit or cough up (sputum) changes from clear or white to yellow, green, gray, or bloody.  The thick spit you spit or cough up gets thicker.  There are problems that may be related to the medicine you are given such as:  A rash.  Itching.  Swelling.  Trouble breathing. GET HELP RIGHT AWAY IF:  You feel you cannot breathe or catch your breath.  You cannot stop coughing.  Your treatment is not helping you breathe better.  You have very bad chest pain. MAKE SURE YOU:   Understand these instructions.  Will watch your condition.  Will get help right away if you are not doing well or get worse. Document Released: 10/03/2009 Document Revised: 12/11/2013 Document Reviewed: 05/29/2013 [facility] Patient Information 2015 [place], [place]. This information is not intended to replace advice given to you by your health care provider. Make sure you discuss any questions you have with your health care provider.  Cough, Adult  A cough is a reflex. It helps you clear your throat and airways. A cough can help heal your body. A cough can last 2 or 3 weeks (acute) or may last more than 8 weeks (chronic). Some common causes of a cough can include an infection,  allergy, or a cold. HOME CARE  Only take medicine as told by your doctor.  If given, take your medicines (antibiotics) as told. Finish them even if you start to feel better.  Use a cold steam vaporizer or humidifier in your home. This can help loosen thick spit (secretions).  Sleep so you are almost sitting up (semi-upright). Use pillows to do this. This helps reduce coughing.  Rest as needed.  Stop smoking if you smoke. GET HELP RIGHT AWAY IF:  You have yellowish-white fluid (pus) in your thick spit.  Your cough gets worse.  Your medicine does not reduce coughing, and you are losing sleep.  You cough up blood.  You have trouble breathing.  Your pain gets worse and medicine does not help.  You have a fever. MAKE SURE YOU:   Understand these instructions.  Will watch your condition.  Will get help right away if you are not doing well or get worse. Document Released: 08/19/2011 Document Revised: 04/22/2014 Document Reviewed: 08/19/2011 [facility] Patient Information 2015 [place], [place]. This information is not intended to replace advice given to you by your health care provider. Make sure you discuss any questions you have  with your health care provider.  How to Use an Inhaler Using your inhaler correctly is very important. Good technique will make sure that the medicine reaches your lungs.  HOW TO USE AN INHALER: 1. Take the cap off the inhaler. 2. If this is the first time using your inhaler, you need to prime it. Shake the inhaler for 5 seconds. Release four puffs into the air, away from your face. Ask your doctor for help if you have questions. 3. Shake the inhaler for 5 seconds. 4. Turn the inhaler so the bottle is above the mouthpiece. 5. Put your pointer finger on top of the bottle. Your thumb holds the bottom of the inhaler. 6. Open your mouth. 7. Either hold the inhaler away from your mouth (the width of 2 fingers) or place your lips tightly around the  mouthpiece. Ask your doctor which way to use your inhaler. 8. Breathe out as much air as possible. 9. Breathe in and push down on the bottle 1 time to release the medicine. You will feel the medicine go in your mouth and throat. 10. Continue to take a deep breath in very slowly. Try to fill your lungs. 11. After you have breathed in completely, hold your breath for 10 seconds. This will help the medicine to settle in your lungs. If you cannot hold your breath for 10 seconds, hold it for as long as you can before you breathe out. 12. Breathe out slowly, through pursed lips. Whistling is an example of pursed lips. 13. If your doctor has told you to take more than 1 puff, wait at least 15-30 seconds between puffs. This will help you get the best results from your medicine. Do not use the inhaler more than your doctor tells you to. 14. Put the cap back on the inhaler. 15. Follow the directions from your doctor or from the inhaler package about cleaning the inhaler. If you use more than one inhaler, ask your doctor which inhalers to use and what order to use them in. Ask your doctor to help you figure out when you will need to refill your inhaler.  If you use a steroid inhaler, always rinse your mouth with water after your last puff, gargle and spit out the water. Do not swallow the water. GET HELP IF:  The inhaler medicine only partially helps to stop wheezing or shortness of breath.  You are having trouble using your inhaler.  You have some increase in thick spit (phlegm). GET HELP RIGHT AWAY IF:  The inhaler medicine does not help your wheezing or shortness of breath or you have tightness in your chest.  You have dizziness, headaches, or fast heart rate.  You have chills, fever, or night sweats.  You have a large increase of thick spit, or your thick spit is bloody. MAKE SURE YOU:   Understand these instructions.  Will watch your condition.  Will get help right away if you are not  doing well or get worse. Document Released: 09/14/2008 Document Revised: 09/26/2013 Document Reviewed: 07/05/2013 [facility] Patient Information 2015 [place], [place]. This information is not intended to replace advice given to you by your health care provider. Make sure you discuss any questions you have with your health care provider.

## 2014-09-06 NOTE — ED Notes (Signed)
C/o cold sx onset 1 week Sx include: chest tightness, SOB, wheezing, vomiting due to cough, decreased appetite/sleep Denies fevers, diarrhea Taking OTC cold meds w/no relief Alert, no signs of acute distress.

## 2014-09-06 NOTE — ED Provider Notes (Signed)
CSN: [id number]     Arrival date & time 09/06/14  9833 History   First MD Initiated Contact with Patient 09/06/14 [phone]     Chief Complaint  Patient presents with  . URI   (Consider location/radiation/quality/duration/timing/severity/associated sxs/prior Treatment) HPI Comments: 63 y o f with cough, loose and hacky, Upper resp congestion, PND, post tussive emesis, right earache, chest pain heaviness, esp right lateral chest. This is worse with deep breathing and movement. Taking Zyrtec   Past Medical History  Diagnosis Date  . Hypertension   . Death of child 64    Due to cord strangulation.   Past Surgical History  Procedure Laterality Date  . Appendectomy  1974  . Cardiac catheterization      two, dates not provided.  . Laparoscopic tubal ligation  1987  . Cholecystectomy      confirm date with patient, was it 87?   Family History  Problem Relation Age of Onset  . COPD Mother   . Diabetes Mother   . Hypertension Mother   . Heart disease Father   . Hypertension Father    History  Substance Use Topics  . Smoking status: Never Smoker   . Smokeless tobacco: Not on file  . Alcohol Use: Yes     Comment: glass of wine or mixed drink on occasion.   OB History   Grav Para Term Preterm Abortions TAB SAB Ect Mult Living                 Review of Systems  Constitutional: Positive for activity change. Negative for fever, chills, appetite change and fatigue.  HENT: Positive for congestion, ear pain, postnasal drip, rhinorrhea, sore throat and voice change. Negative for facial swelling and trouble swallowing.   Eyes: Negative.   Respiratory: Positive for cough, chest tightness, shortness of breath and wheezing.   Cardiovascular: Positive for chest pain. Negative for palpitations and leg swelling.  Gastrointestinal:       Post tussive emesis  Genitourinary: Negative.   Musculoskeletal: Negative for neck pain and neck stiffness.  Skin: Negative for pallor and rash.   Neurological: Negative.     Allergies  Erythromycin and Ibuprofen  Home Medications   Prior to Admission medications   Medication Sig Start Date End Date Taking? Authorizing Provider  albuterol (PROVENTIL HFA;VENTOLIN HFA) 108 (90 BASE) MCG/ACT inhaler Inhale 2 puffs into the lungs every 4 (four) hours as needed for wheezing or shortness of breath. 09/06/14   Janne Napoleon, NP  aliskiren (TEKTURNA) 150 MG tablet Take 150 mg by mouth daily.      Historical Provider, MD  Chlorphen-PE-Acetaminophen 4-10-325 MG TABS 1 tab po q 6 h prn drainage and congestion 09/06/14   Janne Napoleon, NP  predniSONE (DELTASONE) 20 MG tablet Take 3 tabs po on first day, 2 tabs second day, 2 tabs third day, 1 tab fourth day, 1 tab 5th day. Take with food. 09/06/14   Janne Napoleon, NP  valsartan-hydrochlorothiazide (DIOVAN-HCT) 320-25 MG per tablet Take 1 tablet by mouth daily.      Historical Provider, MD   BP 136/62  Pulse 83  Temp(Src) 97.9 F (36.6 C) (Oral)  Resp 16  SpO2 96% Physical Exam  Nursing note and vitals reviewed. Constitutional: She is oriented to person, place, and time. She appears well-developed and well-nourished. No distress.  HENT:  Mouth/Throat: No oropharyngeal exudate.  Bilat TM's retracted. No erythema or effusion. OP with minor erythema and cobblestoning.  Eyes: Conjunctivae and EOM are normal.  Neck: Normal range of motion. Neck supple.  Cardiovascular: Normal rate, regular rhythm and normal heart sounds.   Pulmonary/Chest: Effort normal. No respiratory distress. She has wheezes.  Coarseness upper chest bilat. End expiratory wheeze.  Generalized chest wall tenderness worse with bilateral palpation as well as anterior chest wall palpation.  Musculoskeletal: Normal range of motion. She exhibits no edema.  Lymphadenopathy:    She has no cervical adenopathy.  Neurological: She is alert and oriented to person, place, and time. She exhibits normal muscle tone.  Skin: Skin is warm and dry.  No rash noted.  Psychiatric: She has a normal mood and affect.    ED Course  Procedures (including critical care time) Labs Review Labs Reviewed - No data to display  Imaging Review No results found.   MDM   1. Other allergic rhinitis   2. RAD (reactive airway disease) with wheezing, mild intermittent, uncomplicated   3. Allergic cough     Duoneb. Post duoneb st she continues to feel bad. Lungs much clearer, no wheezes, diminished coarseness, improved air movement and decreased observed coughing.  Kenalog 6o mg IM Prednisone taper Norel AD Lots of fluids' Albuterol HFA for cough and wheeze. For worsening, trouble breathing, fever or other may return or go to your doctor or the ED  Allergic Rhinitis Flonase nasal spray. Lots of saline nasal spray Ibuprofen for aches and pain   Janne Napoleon, NP 09/06/14 [phone]

## 2014-09-06 NOTE — ED Provider Notes (Signed)
Medical screening examination/treatment/procedure(s) were performed by resident physician or non-physician practitioner and as supervising physician I was immediately available for consultation/collaboration.   Pauline Good MD.   Billy Fischer, MD 09/06/14 [phone]

## 2014-10-04 ENCOUNTER — Ambulatory Visit
Admission: RE | Admit: 2014-10-04 | Discharge: 2014-10-04 | Disposition: A | Discharge status: Home / Self Care | Payer: 59 | Source: Ambulatory Visit | Attending: Nurse Practitioner | Admitting: Nurse Practitioner

## 2014-10-04 ENCOUNTER — Other Ambulatory Visit: Payer: Self-pay | Admitting: Nurse Practitioner

## 2014-10-04 DIAGNOSIS — R053 Chronic cough: Secondary | ICD-10-CM

## 2014-10-04 DIAGNOSIS — R05 Cough: Secondary | ICD-10-CM

## 2015-07-17 ENCOUNTER — Encounter (HOSPITAL_COMMUNITY): Payer: Self-pay | Admitting: *Deleted

## 2015-07-17 ENCOUNTER — Emergency Department (HOSPITAL_COMMUNITY): Payer: 59

## 2015-07-17 ENCOUNTER — Emergency Department (HOSPITAL_COMMUNITY)
Admission: EM | Admit: 2015-07-17 | Discharge: 2015-07-17 | Disposition: A | Discharge status: Home / Self Care | Payer: 59 | Attending: Emergency Medicine | Admitting: Emergency Medicine

## 2015-07-17 DIAGNOSIS — Z7952 Long term (current) use of systemic steroids: Secondary | ICD-10-CM | POA: Insufficient documentation

## 2015-07-17 DIAGNOSIS — H538 Other visual disturbances: Secondary | ICD-10-CM | POA: Diagnosis not present

## 2015-07-17 DIAGNOSIS — R05 Cough: Secondary | ICD-10-CM | POA: Insufficient documentation

## 2015-07-17 DIAGNOSIS — R55 Syncope and collapse: Secondary | ICD-10-CM | POA: Insufficient documentation

## 2015-07-17 DIAGNOSIS — R51 Headache: Secondary | ICD-10-CM | POA: Diagnosis not present

## 2015-07-17 DIAGNOSIS — R112 Nausea with vomiting, unspecified: Secondary | ICD-10-CM | POA: Diagnosis not present

## 2015-07-17 DIAGNOSIS — R531 Weakness: Secondary | ICD-10-CM | POA: Insufficient documentation

## 2015-07-17 DIAGNOSIS — I10 Essential (primary) hypertension: Secondary | ICD-10-CM | POA: Insufficient documentation

## 2015-07-17 DIAGNOSIS — Z79899 Other long term (current) drug therapy: Secondary | ICD-10-CM | POA: Insufficient documentation

## 2015-07-17 LAB — CBC WITH DIFFERENTIAL/PLATELET
Basophils Absolute: 0 10*3/uL (ref 0.0–0.1)
Basophils Relative: 0 % (ref 0–1)
EOS ABS: 0 10*3/uL (ref 0.0–0.7)
Eosinophils Relative: 0 % (ref 0–5)
HCT: 41.8 % (ref 36.0–46.0)
HEMOGLOBIN: 13.6 g/dL (ref 12.0–15.0)
LYMPHS PCT: 17 % (ref 12–46)
Lymphs Abs: 1.4 10*3/uL (ref 0.7–4.0)
MCH: 29 pg (ref 26.0–34.0)
MCHC: 32.5 g/dL (ref 30.0–36.0)
MCV: 89.1 fL (ref 78.0–100.0)
MONO ABS: 0.5 10*3/uL (ref 0.1–1.0)
Monocytes Relative: 6 % (ref 3–12)
NEUTROS ABS: 6.2 10*3/uL (ref 1.7–7.7)
Neutrophils Relative %: 77 % (ref 43–77)
Platelets: 370 10*3/uL (ref 150–400)
RBC: 4.69 MIL/uL (ref 3.87–5.11)
RDW: 14.7 % (ref 11.5–15.5)
WBC: 8.2 10*3/uL (ref 4.0–10.5)

## 2015-07-17 LAB — COMPREHENSIVE METABOLIC PANEL
ALBUMIN: 3.7 g/dL (ref 3.5–5.0)
ALK PHOS: 95 U/L (ref 38–126)
ALT: 21 U/L (ref 14–54)
ANION GAP: 12 (ref 5–15)
AST: 22 U/L (ref 15–41)
BUN: 13 mg/dL (ref 6–20)
CALCIUM: 9 mg/dL (ref 8.9–10.3)
CHLORIDE: 98 mmol/L — AB (ref 101–111)
CO2: 26 mmol/L (ref 22–32)
CREATININE: 0.64 mg/dL (ref 0.44–1.00)
GFR calc non Af Amer: >60 mL/min (ref >60)
GLUCOSE: 163 mg/dL — AB (ref 65–99)
Potassium: 3.4 mmol/L — ABNORMAL LOW (ref 3.5–5.1)
Sodium: 136 mmol/L (ref 135–145)
Total Bilirubin: 0.8 mg/dL (ref 0.3–1.2)
Total Protein: 7.4 g/dL (ref 6.5–8.1)

## 2015-07-17 LAB — I-STAT TROPONIN, ED
Troponin i, poc: 0 ng/mL (ref 0.00–0.08)
Troponin i, poc: 0 ng/mL (ref 0.00–0.08)

## 2015-07-17 MED ORDER — ONDANSETRON HCL 4 MG/2ML IJ SOLN
4.0000 mg | Freq: Once | INTRAMUSCULAR | Status: AC
  Administered 2015-07-17: 4 mg via INTRAVENOUS

## 2015-07-17 MED ORDER — DIPHENHYDRAMINE HCL 50 MG/ML IJ SOLN: 25.0000 mg | Freq: Once | INTRAMUSCULAR | Status: DC

## 2015-07-17 MED ORDER — ONDANSETRON HCL 4 MG/2ML IJ SOLN
INTRAMUSCULAR | Status: AC
  Administered 2015-07-17: 4 mg via INTRAVENOUS
  Filled 2015-07-17: qty 2

## 2015-07-17 MED ORDER — ACETAMINOPHEN 325 MG PO TABS
650.0000 mg | ORAL_TABLET | Freq: Once | ORAL | Status: AC
  Administered 2015-07-17: 650 mg via ORAL
  Filled 2015-07-17: qty 2

## 2015-07-17 MED ORDER — SODIUM CHLORIDE 0.9 % IV BOLUS (SEPSIS)
1000.0000 mL | Freq: Once | INTRAVENOUS | Status: AC
  Administered 2015-07-17: 1000 mL via INTRAVENOUS

## 2015-07-17 MED ORDER — MAGNESIUM SULFATE 2 GM/50ML IV SOLN
2.0000 g | Freq: Once | INTRAVENOUS | Status: AC
  Administered 2015-07-17: 2 g via INTRAVENOUS
  Filled 2015-07-17: qty 50

## 2015-07-17 MED ORDER — PROCHLORPERAZINE EDISYLATE 5 MG/ML IJ SOLN: 10.0000 mg | Freq: Once | INTRAMUSCULAR | Status: DC

## 2015-07-17 MED ORDER — DEXAMETHASONE SODIUM PHOSPHATE 10 MG/ML IJ SOLN
10.0000 mg | Freq: Once | INTRAMUSCULAR | Status: AC
  Administered 2015-07-17: 10 mg via INTRAVENOUS
  Filled 2015-07-17: qty 1

## 2015-07-17 NOTE — ED Notes (Signed)
Pt is eating and drinking well, no c/o nausea.

## 2015-07-17 NOTE — Discharge Instructions (Signed)
Please refer to hand out for medications to avoid

## 2015-07-17 NOTE — ED Notes (Signed)
Pt arrives from [facility], pt states she has had a chronic cough for "a while now" and has been taking cough medicine with codeine. Pt states she felt like she had a hangover when she woke up, but had some coffee and came into work. Pt states she was sitting in her office and became diaphoretic, nauseated and dizzy. Pt began throwing up and was very weak. Nursing staff from [facility] states the pt was weak and it took 2 of them to get the pt up and into a wheelchair in order to bring her downstairs. Pt was easily aroused once in the ED.

## 2015-07-17 NOTE — ED Notes (Signed)
Pt ambulates with no assistance.

## 2015-07-17 NOTE — ED Provider Notes (Signed)
CSN: [id number]     Arrival date & time 07/17/15  4270 History   First MD Initiated Contact with Patient 07/17/15 [phone]     Chief Complaint  Patient presents with  . Near Syncope  . Emesis     (Consider location/radiation/quality/duration/timing/severity/associated sxs/prior Treatment) HPI  This is a 64 year old female without any significant past medical history who presents to the emergency department with acute onset of vomiting and near syncope. She was doing well until she went to work today at which time she had acute onset of vision and feeling she didn't pass out. Afterwards she had a headache as well. Checked her blood pressure which was normal for her at 628 systolic. Was transferred here for further evaluation. The way here she did have 1 episode of nonbloody nonbilious vomiting. No history of the same. She has had headaches in the past. But no near-syncopal episodes and not this constellation. No suspicious food intake and no ill contacts, however she does work in the hospital.  Past Medical History  Diagnosis Date  . Hypertension   . Death of child 77    Due to cord strangulation.   Past Surgical History  Procedure Laterality Date  . Appendectomy  1974  . Cardiac catheterization      two, dates not provided.  . Laparoscopic tubal ligation  1987  . Cholecystectomy      confirm date with patient, was it 26?   Family History  Problem Relation Age of Onset  . COPD Mother   . Diabetes Mother   . Hypertension Mother   . Heart disease Father   . Hypertension Father    History  Substance Use Topics  . Smoking status: Never Smoker   . Smokeless tobacco: Not on file  . Alcohol Use: Yes     Comment: glass of wine or mixed drink on occasion.   OB History    No data available     Review of Systems  Constitutional: Negative for fever and chills.  HENT: Negative for congestion and ear pain.   Eyes: Negative for photophobia and pain.  Respiratory: Positive for  cough (constant). Negative for shortness of breath.   Cardiovascular: Negative for chest pain and palpitations.  Gastrointestinal: Positive for nausea and vomiting. Negative for abdominal pain.  Endocrine: Negative for polydipsia.  Genitourinary: Negative for dysuria and pelvic pain.  Neurological: Positive for weakness and headaches.      Allergies  Erythromycin and Ibuprofen  Home Medications   Prior to Admission medications   Medication Sig Start Date End Date Taking? Authorizing Provider  albuterol (PROVENTIL HFA;VENTOLIN HFA) 108 (90 BASE) MCG/ACT inhaler Inhale 2 puffs into the lungs every 4 (four) hours as needed for wheezing or shortness of breath. 09/06/14   Janne Napoleon, NP  aliskiren (TEKTURNA) 150 MG tablet Take 150 mg by mouth daily.      Historical Provider, MD  Chlorphen-PE-Acetaminophen 4-10-325 MG TABS 1 tab po q 6 h prn drainage and congestion 09/06/14   Janne Napoleon, NP  predniSONE (DELTASONE) 20 MG tablet Take 3 tabs po on first day, 2 tabs second day, 2 tabs third day, 1 tab fourth day, 1 tab 5th day. Take with food. 09/06/14   Janne Napoleon, NP  valsartan-hydrochlorothiazide (DIOVAN-HCT) 320-25 MG per tablet Take 1 tablet by mouth daily.      Historical Provider, MD   BP 153/93 mmHg  Pulse 94  Temp(Src) 98.1 F (36.7 C) (Oral)  Resp 18  SpO2 96% Physical Exam  Constitutional: She is oriented to person, place, and time. She appears well-developed and well-nourished.  HENT:  Head: Normocephalic and atraumatic.  Eyes: Pupils are equal, round, and reactive to light.  Cardiovascular: Normal rate and regular rhythm.   Slightly hypertensive  Abdominal: Soft. She exhibits no distension. There is no tenderness.  Musculoskeletal: Normal range of motion. She exhibits no edema or tenderness.  Neurological: She is alert and oriented to person, place, and time. No cranial nerve deficit.  Moves all extremities, sensation intact to soft touch, slightly hypereflexic in LE's however  is symmetric.  Nursing note and vitals reviewed.   ED Course  Procedures (including critical care time) Labs Review Labs Reviewed  COMPREHENSIVE METABOLIC PANEL - Abnormal; Notable for the following:    Potassium 3.4 (*)    Chloride 98 (*)    Glucose, Bld 163 (*)    All other components within normal limits  CBC WITH DIFFERENTIAL/PLATELET  CBC WITH DIFFERENTIAL/PLATELET  I-STAT TROPOININ, ED  Randolm Idol, ED    Imaging Review Dg Chest 2 View  07/17/2015   CLINICAL DATA:  Diaphoresis and near syncope with nausea and vomiting  EXAM: CHEST  2 VIEW  COMPARISON:  October 04, 2014 and October 22, 2010  FINDINGS: There is a 5 mm nodular opacity in the left lower lobe region. Lungs elsewhere clear. Heart size and pulmonary vascularity are normal. No adenopathy. There is mild degenerative change in the thoracic spine. There is postoperative change in the neck consistent with previous thyroidectomy.  IMPRESSION: 5 mm nodular opacity left lower lobe, not seen previously. Noncontrast enhanced chest CT to further assess advised given apparent change.  No edema or consolidation. Postoperative change in the lower neck region.   Electronically Signed   By: Lowella Grip III M.D.   On: 07/17/2015 10:15   Ct Head Wo Contrast  07/17/2015   CLINICAL DATA:  Acute onset frontal headache with dizziness and nausea  EXAM: CT HEAD WITHOUT CONTRAST  TECHNIQUE: Contiguous axial images were obtained from the base of the skull through the vertex without intravenous contrast.  COMPARISON:  None.  FINDINGS: The ventricles are normal in size and configuration. There is no intracranial mass, hemorrhage, extra-axial fluid collection, or midline shift. The gray-white compartments are normal. There is no demonstrable acute infarct. The bony calvarium appears intact. The mastoid air cells are clear. There is a retention cyst in the sphenoid sinus region. Other paranasal sinuses are clear. Orbits appear symmetric  bilaterally.  IMPRESSION: Retention cyst in sphenoid sinus region. No intracranial mass, hemorrhage, or focal gray -white compartment lesions/acute appearing infarct.   Electronically Signed   By: Lowella Grip III M.D.   On: 07/17/2015 10:18   Ct Chest Wo Contrast  07/17/2015   CLINICAL DATA:  Chronic cough with diaphoresis and dizziness  EXAM: CT CHEST WITHOUT CONTRAST  TECHNIQUE: Multidetector CT imaging of the chest was performed following the standard protocol without IV contrast.  COMPARISON:  Chest CT October 22, 2010; chest radiograph July 17, 2015  FINDINGS: There is no appreciable pulmonary nodular lesion in the left lower lobe to correspond with the small nodular appearing area seen on chest radiograph earlier in the day. There is mild scarring in the medial segment of the right lower lobe as well as in each each posterior segment lower lobe. There is no edema or consolidation. There is slight dependent atelectasis in the left base.  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 There is no  appreciable thoracic adenopathy. There is no thoracic aortic aneurysm. There is a small amount of coronary artery calcification. Pericardium is not thickened.  In the visualized upper abdomen, gallbladder is surgically absent. Visualized upper abdominal structures otherwise appear unremarkable.  There are no blastic or lytic bone lesions. There is evidence of minimal posterior left ninth rib fracture, healed.  IMPRESSION: Slight bibasilar scarring. No edema or consolidation. No nodular opacity is seen by CT to correspond with the nodular appearing area in the left lower lobe on chest radiography obtained earlier in the day. This nodular appearing area on the radiographic examination probably represents a relatively peripheral vessel on end.  Small focus of coronary artery calcification.  No adenopathy.  Thyroid absent.  Gallbladder absent.   Electronically Signed   By: Lowella Grip III  M.D.   On: 07/17/2015 12:44     EKG Interpretation   Date/Time:  Thursday July 17 2015 10:33:33 EDT Ventricular Rate:  78 PR Interval:  154 QRS Duration: 85 QT Interval:  494 QTC Calculation: 563 R Axis:   33 Text Interpretation:  Sinus rhythm Minimal ST depression, inferior leads  Prolonged QT interval Confirmed by [facility] MD, Corene Cornea [phone]) on 07/17/2015  10:53:00 AM      MDM   Final diagnoses:  Near syncope  Prolonged QT syndrome   Near-syncopal episode associated with vomiting headache blurry vision. Acute onset approximately 2 hours ago has not improved as I got worse during that time. Exam as above. With head CT to evaluate for possible head bleed, EKG to evaluate for arrhythmias, but the vomiting associated with it will check a troponin to make sure this isn't related to her heart, electrolytes and CBC as well as she has had a history of having a low hemoglobin.  CXR with concern for possible nodule so CT scan done as requested by radiology read and negative. Head CT negative for bleed. Started approx two hours prior to arrival, so I am confident that it wouldve shown up if present. Was found to have prolonged QT on ECG and on further reading dextromethorphan (which she took late last night for cough) can cause worsening. Magnesium given to help with headache and also to help with QT. Fluids given and patient reevaluted, found to have decreased headache, normal neuro exam, able to ambulate without difficulty. Tolerating PO. Repeat ECG with improved QT. Doubt prolonged QT syndrome, not on any other meds that can cause prolonged QT, hand out given that lists meds associated with it. Will follow up with PCP in a few days for repeat ECG to evaluate for continued prolonged QT.   I have personally and contemperaneously reviewed labs and imaging and used in my decision making as above.   A medical screening exam was performed and I feel the patient has had an appropriate workup for their  chief complaint at this time and likelihood of emergent condition existing is low. They have been counseled on decision, discharge, follow up and which symptoms necessitate immediate return to the emergency department. They or their family verbally stated understanding and agreement with plan and discharged in stable condition.      Merrily Pew, MD 07/17/15 2034

## 2015-07-17 NOTE — ED Notes (Signed)
Pt is in stable condition upon d/c and is escorted via wheelchair from ED.

## 2015-07-17 NOTE — ED Notes (Signed)
Phlebotomy at bedside.

## 2015-07-17 NOTE — ED Notes (Signed)
Patient transported to X-ray 

## 2015-07-17 NOTE — ED Notes (Signed)
Phlebotomy called to draw cbc rt 1st specimen clotted.

## 2015-12-23 MED FILL — ONGLYZA 2.5 MG TABLET: 2.5 | 30 days supply | Qty: 30 | Fill #1

## 2015-12-23 MED FILL — SYNTHROID 100 MCG TABLET: 100 | 30 days supply | Qty: 30 | Fill #3

## 2015-12-24 DIAGNOSIS — E1165 Type 2 diabetes mellitus with hyperglycemia: Secondary | ICD-10-CM | POA: Diagnosis not present

## 2015-12-24 DIAGNOSIS — E039 Hypothyroidism, unspecified: Secondary | ICD-10-CM | POA: Diagnosis not present

## 2015-12-24 DIAGNOSIS — I10 Essential (primary) hypertension: Secondary | ICD-10-CM | POA: Diagnosis not present

## 2015-12-24 MED FILL — hydrALAZINE HCL 25 MG TABS: 25 | 30 days supply | Qty: 60 | Fill #0

## 2016-01-26 MED FILL — GLIMEPIRIDE 1 MG TABLET: 1 | 30 days supply | Qty: 30 | Fill #0

## 2016-01-26 MED FILL — SYNTHROID 100 MCG TABLET: 100 | 30 days supply | Qty: 30 | Fill #0

## 2016-02-09 MED FILL — hydrALAZINE HCL 25 MG TABS: 25 | 30 days supply | Qty: 60 | Fill #1

## 2016-02-25 MED FILL — SYNTHROID 100 MCG TABLET: 100 | 30 days supply | Qty: 30 | Fill #1

## 2016-02-25 MED FILL — GLIMEPIRIDE 1 MG TABLET: 1 | 30 days supply | Qty: 30 | Fill #1

## 2016-03-03 MED FILL — hydrALAZINE HCL 50 MG TABS: 50 | 30 days supply | Qty: 30 | Fill #0

## 2016-03-03 MED FILL — GLIMEPIRIDE 2 MG TABLET: 2 | 30 days supply | Qty: 30 | Fill #0

## 2016-03-03 MED FILL — HYDROCHLOROTHIAZIDE 25 MG T: 25 | 30 days supply | Qty: 30 | Fill #0

## 2016-03-20 DIAGNOSIS — H52223 Regular astigmatism, bilateral: Secondary | ICD-10-CM | POA: Diagnosis not present

## 2016-03-20 DIAGNOSIS — H524 Presbyopia: Secondary | ICD-10-CM | POA: Diagnosis not present

## 2016-03-20 DIAGNOSIS — H2513 Age-related nuclear cataract, bilateral: Secondary | ICD-10-CM | POA: Diagnosis not present

## 2016-03-20 DIAGNOSIS — H5203 Hypermetropia, bilateral: Secondary | ICD-10-CM | POA: Diagnosis not present

## 2016-03-20 DIAGNOSIS — H1045 Other chronic allergic conjunctivitis: Secondary | ICD-10-CM | POA: Diagnosis not present

## 2016-03-20 DIAGNOSIS — H401134 Primary open-angle glaucoma, bilateral, indeterminate stage: Secondary | ICD-10-CM | POA: Diagnosis not present

## 2016-03-20 DIAGNOSIS — H25013 Cortical age-related cataract, bilateral: Secondary | ICD-10-CM | POA: Diagnosis not present

## 2016-03-22 DIAGNOSIS — E039 Hypothyroidism, unspecified: Secondary | ICD-10-CM | POA: Diagnosis not present

## 2016-03-22 DIAGNOSIS — I10 Essential (primary) hypertension: Secondary | ICD-10-CM | POA: Diagnosis not present

## 2016-03-22 DIAGNOSIS — R7309 Other abnormal glucose: Secondary | ICD-10-CM | POA: Diagnosis not present

## 2016-03-22 MED FILL — SYNTHROID 100 MCG TABLET: 100 | 30 days supply | Qty: 30 | Fill #2

## 2016-03-23 MED FILL — ONETOUCH ULTRAMINI METER: W/DEVICE | 30 days supply | Qty: 1 | Fill #0

## 2016-03-25 DIAGNOSIS — S0500XA Injury of conjunctiva and corneal abrasion without foreign body, unspecified eye, initial encounter: Secondary | ICD-10-CM | POA: Diagnosis not present

## 2016-03-25 DIAGNOSIS — H18413 Arcus senilis, bilateral: Secondary | ICD-10-CM | POA: Diagnosis not present

## 2016-03-25 DIAGNOSIS — H401134 Primary open-angle glaucoma, bilateral, indeterminate stage: Secondary | ICD-10-CM | POA: Diagnosis not present

## 2016-03-25 DIAGNOSIS — H25013 Cortical age-related cataract, bilateral: Secondary | ICD-10-CM | POA: Diagnosis not present

## 2016-03-25 DIAGNOSIS — H2513 Age-related nuclear cataract, bilateral: Secondary | ICD-10-CM | POA: Diagnosis not present

## 2016-03-25 DIAGNOSIS — H11153 Pinguecula, bilateral: Secondary | ICD-10-CM | POA: Diagnosis not present

## 2016-03-25 DIAGNOSIS — H04123 Dry eye syndrome of bilateral lacrimal glands: Secondary | ICD-10-CM | POA: Diagnosis not present

## 2016-03-30 DIAGNOSIS — H2513 Age-related nuclear cataract, bilateral: Secondary | ICD-10-CM | POA: Diagnosis not present

## 2016-03-30 DIAGNOSIS — H53423 Scotoma of blind spot area, bilateral: Secondary | ICD-10-CM | POA: Diagnosis not present

## 2016-03-30 DIAGNOSIS — H43813 Vitreous degeneration, bilateral: Secondary | ICD-10-CM | POA: Diagnosis not present

## 2016-03-30 DIAGNOSIS — H40053 Ocular hypertension, bilateral: Secondary | ICD-10-CM | POA: Diagnosis not present

## 2016-03-30 MED FILL — DORZOLAMIDE-TIMOLOL EYE DRP: 22.3-6.8 | 90 days supply | Qty: 20 | Fill #0

## 2016-03-30 MED FILL — HYDROCHLOROTHIAZIDE 25 MG T: 25 | 30 days supply | Qty: 30 | Fill #1

## 2016-03-31 MED FILL — LUMIGAN 0.01% EYE DROPS: 0.01 | 25 days supply | Qty: 3 | Fill #0

## 2016-04-01 ENCOUNTER — Other Ambulatory Visit (HOSPITAL_COMMUNITY): Payer: Self-pay | Admitting: Optometry

## 2016-04-01 DIAGNOSIS — H53423 Scotoma of blind spot area, bilateral: Secondary | ICD-10-CM

## 2016-04-09 ENCOUNTER — Ambulatory Visit (HOSPITAL_COMMUNITY): Admission: RE | Admit: 2016-04-09 | Payer: 59 | Source: Ambulatory Visit

## 2016-04-12 MED FILL — GLIMEPIRIDE 2 MG TABLET: 2 | 30 days supply | Qty: 30 | Fill #1

## 2016-04-12 MED FILL — hydrALAZINE HCL 50 MG TABS: 50 | 30 days supply | Qty: 30 | Fill #1

## 2016-04-26 MED FILL — SYNTHROID 100 MCG TABLET: 100 | 30 days supply | Qty: 30 | Fill #0

## 2016-05-03 MED FILL — HYDROCHLOROTHIAZIDE 25 MG T: 25 | 30 days supply | Qty: 30 | Fill #2

## 2016-05-11 DIAGNOSIS — H43813 Vitreous degeneration, bilateral: Secondary | ICD-10-CM | POA: Diagnosis not present

## 2016-05-11 DIAGNOSIS — H53423 Scotoma of blind spot area, bilateral: Secondary | ICD-10-CM | POA: Diagnosis not present

## 2016-05-11 DIAGNOSIS — H2513 Age-related nuclear cataract, bilateral: Secondary | ICD-10-CM | POA: Diagnosis not present

## 2016-05-11 DIAGNOSIS — H40053 Ocular hypertension, bilateral: Secondary | ICD-10-CM | POA: Diagnosis not present

## 2016-05-11 MED FILL — hydrALAZINE HCL 50 MG TABS: 50 | 30 days supply | Qty: 30 | Fill #2

## 2016-05-11 MED FILL — GLIMEPIRIDE 2 MG TABLET: 2 | 30 days supply | Qty: 30 | Fill #2

## 2016-05-24 MED FILL — SYNTHROID 100 MCG TABLET: 100 | 30 days supply | Qty: 30 | Fill #1

## 2016-05-31 MED FILL — HYDROCHLOROTHIAZIDE 25 MG T: 25 | 30 days supply | Qty: 30 | Fill #0

## 2016-06-08 MED FILL — hydrALAZINE HCL 50 MG TABS: 50 | 30 days supply | Qty: 30 | Fill #3

## 2016-06-15 MED FILL — GLIMEPIRIDE 2 MG TABLET: 2 | 30 days supply | Qty: 30 | Fill #3

## 2016-06-24 MED FILL — SYNTHROID 100 MCG TABLET: 100 | 30 days supply | Qty: 30 | Fill #2

## 2016-06-25 DIAGNOSIS — E1165 Type 2 diabetes mellitus with hyperglycemia: Secondary | ICD-10-CM | POA: Diagnosis not present

## 2016-06-25 DIAGNOSIS — I10 Essential (primary) hypertension: Secondary | ICD-10-CM | POA: Diagnosis not present

## 2016-06-25 MED FILL — FLUCONAZOLE 150 MG TABLET: 150 | 5 days supply | Qty: 2 | Fill #0

## 2016-06-30 MED FILL — HYDROCHLOROTHIAZIDE 25 MG T: 25 | 30 days supply | Qty: 30 | Fill #1

## 2016-07-13 MED FILL — GLIMEPIRIDE 2 MG TABLET: 2 | 30 days supply | Qty: 30 | Fill #0

## 2016-07-13 MED FILL — hydrALAZINE HCL 50 MG TABS: 50 | 30 days supply | Qty: 30 | Fill #0

## 2016-07-26 MED FILL — SYNTHROID 100 MCG TABLET: 100 | 30 days supply | Qty: 30 | Fill #0

## 2016-08-03 MED FILL — HYDROCHLOROTHIAZIDE 25 MG T: 25 | 30 days supply | Qty: 30 | Fill #2

## 2016-08-11 MED FILL — hydrALAZINE HCL 50 MG TABS: 50 | 30 days supply | Qty: 30 | Fill #1

## 2016-08-17 MED FILL — GLIMEPIRIDE 2 MG TABLET: 2 | 30 days supply | Qty: 30 | Fill #1

## 2016-08-24 MED FILL — SYNTHROID 100 MCG TABLET: 100 | 30 days supply | Qty: 30 | Fill #1

## 2016-09-01 MED FILL — HYDROCHLOROTHIAZIDE 25 MG T: 25 | 30 days supply | Qty: 30 | Fill #0

## 2016-09-08 MED FILL — hydrALAZINE HCL 50 MG TABS: 50 | 30 days supply | Qty: 30 | Fill #2

## 2016-09-16 MED FILL — GLIMEPIRIDE 2 MG TABLET: 2 | 30 days supply | Qty: 30 | Fill #2

## 2016-09-29 MED FILL — SYNTHROID 100 MCG TABLET: 100 | 30 days supply | Qty: 30 | Fill #2

## 2016-10-01 DIAGNOSIS — Z1389 Encounter for screening for other disorder: Secondary | ICD-10-CM | POA: Diagnosis not present

## 2016-10-01 DIAGNOSIS — Z0000 Encounter for general adult medical examination without abnormal findings: Secondary | ICD-10-CM | POA: Diagnosis not present

## 2016-10-01 DIAGNOSIS — R7309 Other abnormal glucose: Secondary | ICD-10-CM | POA: Diagnosis not present

## 2016-10-01 DIAGNOSIS — I10 Essential (primary) hypertension: Secondary | ICD-10-CM | POA: Diagnosis not present

## 2016-10-01 DIAGNOSIS — Z1211 Encounter for screening for malignant neoplasm of colon: Secondary | ICD-10-CM | POA: Diagnosis not present

## 2016-10-01 DIAGNOSIS — Z1231 Encounter for screening mammogram for malignant neoplasm of breast: Secondary | ICD-10-CM | POA: Diagnosis not present

## 2016-10-06 MED FILL — HYDROCHLOROTHIAZIDE 25 MG T: 25 | 30 days supply | Qty: 30 | Fill #1

## 2016-10-12 MED FILL — hydrALAZINE HCL 50 MG TABS: 50 | 30 days supply | Qty: 30 | Fill #3

## 2016-10-19 MED FILL — GLIMEPIRIDE 2 MG TABLET: 2 | 30 days supply | Qty: 30 | Fill #3

## 2016-11-03 MED FILL — HYDROCHLOROTHIAZIDE 25 MG T: 25 | 30 days supply | Qty: 30 | Fill #2

## 2016-11-09 MED FILL — SYNTHROID 100 MCG TABLET: 100 | 30 days supply | Qty: 30 | Fill #0

## 2016-11-09 MED FILL — CYCLOBENZAPRINE 10 MG TAB: 10 | 30 days supply | Qty: 90 | Fill #0

## 2016-11-09 MED FILL — hydrALAZINE HCL 50 MG TABS: 50 | 30 days supply | Qty: 30 | Fill #0

## 2016-11-17 MED FILL — GLIMEPIRIDE 2 MG TABLET: 2 | 30 days supply | Qty: 30 | Fill #0

## 2016-12-08 MED FILL — SYNTHROID 100 MCG TABLET: 100 | 30 days supply | Qty: 30 | Fill #1

## 2016-12-09 MED FILL — HYDROCHLOROTHIAZIDE 25 MG T: 25 | 30 days supply | Qty: 30 | Fill #0

## 2016-12-14 MED FILL — GLIMEPIRIDE 2 MG TABLET: 2 | 30 days supply | Qty: 30 | Fill #1

## 2016-12-14 MED FILL — hydrALAZINE HCL 50 MG TABS: 50 | 30 days supply | Qty: 30 | Fill #1

## 2016-12-16 DIAGNOSIS — J00 Acute nasopharyngitis [common cold]: Secondary | ICD-10-CM | POA: Diagnosis not present

## 2016-12-16 DIAGNOSIS — J209 Acute bronchitis, unspecified: Secondary | ICD-10-CM | POA: Diagnosis not present

## 2016-12-16 MED FILL — HYDROCODONE-CHLORPHENIRAM S: 10-8 | 18 days supply | Qty: 180 | Fill #0

## 2017-01-10 MED FILL — SYNTHROID 100 MCG TABLET: 100 | 30 days supply | Qty: 30 | Fill #2

## 2017-01-10 MED FILL — hydrALAZINE HCL 50 MG TABS: 50 | 30 days supply | Qty: 30 | Fill #2

## 2017-01-10 MED FILL — HYDROCHLOROTHIAZIDE 25 MG T: 25 | 30 days supply | Qty: 30 | Fill #1

## 2017-01-14 MED FILL — HYDROCODONE-CHLORPHENIRAM S: 10-8 | 18 days supply | Qty: 180 | Fill #0

## 2017-01-19 MED FILL — GLIMEPIRIDE 2 MG TABLET: 2 | 30 days supply | Qty: 30 | Fill #2

## 2017-02-10 MED FILL — SYNTHROID 100 MCG TABLET: 100 | 30 days supply | Qty: 30 | Fill #0

## 2017-02-10 MED FILL — HYDROCHLOROTHIAZIDE 25 MG T: 25 | 30 days supply | Qty: 30 | Fill #2

## 2017-02-14 ENCOUNTER — Ambulatory Visit
Admission: RE | Admit: 2017-02-14 | Discharge: 2017-02-14 | Disposition: A | Discharge status: Home / Self Care | Payer: 59 | Source: Ambulatory Visit | Attending: Nurse Practitioner | Admitting: Nurse Practitioner

## 2017-02-14 ENCOUNTER — Other Ambulatory Visit: Payer: Self-pay | Admitting: Nurse Practitioner

## 2017-02-14 DIAGNOSIS — R05 Cough: Secondary | ICD-10-CM

## 2017-02-14 DIAGNOSIS — R053 Chronic cough: Secondary | ICD-10-CM

## 2017-02-14 DIAGNOSIS — I10 Essential (primary) hypertension: Secondary | ICD-10-CM | POA: Diagnosis not present

## 2017-02-14 DIAGNOSIS — E1165 Type 2 diabetes mellitus with hyperglycemia: Secondary | ICD-10-CM | POA: Diagnosis not present

## 2017-02-14 DIAGNOSIS — R079 Chest pain, unspecified: Secondary | ICD-10-CM | POA: Diagnosis not present

## 2017-02-15 MED FILL — GLIMEPIRIDE 2 MG TABLET: 2 | 30 days supply | Qty: 30 | Fill #3

## 2017-02-15 MED FILL — hydrALAZINE HCL 50 MG TABS: 50 | 30 days supply | Qty: 30 | Fill #3

## 2017-03-15 MED FILL — SYNTHROID 100 MCG TABLET: 100 | 30 days supply | Qty: 30 | Fill #1

## 2017-03-15 MED FILL — HYDROCHLOROTHIAZIDE 25 MG T: 25 | 30 days supply | Qty: 30 | Fill #0

## 2017-03-21 MED FILL — hydrALAZINE HCL 50 MG TABS: 50 | 30 days supply | Qty: 30 | Fill #0

## 2017-03-22 MED FILL — GLIMEPIRIDE 2 MG TABLET: 2 | 90 days supply | Qty: 90 | Fill #0

## 2017-04-12 MED FILL — SYNTHROID 100 MCG TABLET: 100 | 30 days supply | Qty: 30 | Fill #2

## 2017-04-12 MED FILL — HYDROCHLOROTHIAZIDE 25 MG T: 25 | 30 days supply | Qty: 30 | Fill #1

## 2017-04-14 MED FILL — hydrALAZINE HCL 50 MG TABS: 50 | 30 days supply | Qty: 30 | Fill #1

## 2017-05-12 MED FILL — HYDROCHLOROTHIAZIDE 25 MG T: 25 | 30 days supply | Qty: 30 | Fill #2

## 2017-05-17 MED FILL — hydrALAZINE HCL 50 MG TABS: 50 | 30 days supply | Qty: 30 | Fill #2

## 2017-05-17 MED FILL — SYNTHROID 100 MCG TABLET: 100 | 30 days supply | Qty: 30 | Fill #0

## 2017-06-15 MED FILL — hydrALAZINE HCL 50 MG TABS: 50 | 30 days supply | Qty: 30 | Fill #3

## 2017-06-15 MED FILL — SYNTHROID 100 MCG TABLET: 100 | 30 days supply | Qty: 30 | Fill #1

## 2017-06-21 MED FILL — HYDROCHLOROTHIAZIDE 25 MG T: 25 | 30 days supply | Qty: 30 | Fill #0

## 2017-06-23 MED FILL — GLIMEPIRIDE 2 MG TABLET: 2 | 30 days supply | Qty: 30 | Fill #0

## 2017-07-05 ENCOUNTER — Encounter (HOSPITAL_COMMUNITY): Payer: Self-pay

## 2017-07-05 ENCOUNTER — Emergency Department (HOSPITAL_COMMUNITY): Payer: 59

## 2017-07-05 ENCOUNTER — Emergency Department (HOSPITAL_COMMUNITY)
Admission: EM | Admit: 2017-07-05 | Discharge: 2017-07-05 | Disposition: A | Discharge status: Home / Self Care | Payer: 59 | Attending: Emergency Medicine | Admitting: Emergency Medicine

## 2017-07-05 DIAGNOSIS — Z9049 Acquired absence of other specified parts of digestive tract: Secondary | ICD-10-CM | POA: Diagnosis not present

## 2017-07-05 DIAGNOSIS — I10 Essential (primary) hypertension: Secondary | ICD-10-CM | POA: Diagnosis not present

## 2017-07-05 DIAGNOSIS — M19071 Primary osteoarthritis, right ankle and foot: Secondary | ICD-10-CM | POA: Insufficient documentation

## 2017-07-05 DIAGNOSIS — Z79899 Other long term (current) drug therapy: Secondary | ICD-10-CM | POA: Diagnosis not present

## 2017-07-05 DIAGNOSIS — E119 Type 2 diabetes mellitus without complications: Secondary | ICD-10-CM | POA: Insufficient documentation

## 2017-07-05 DIAGNOSIS — M25571 Pain in right ankle and joints of right foot: Secondary | ICD-10-CM | POA: Diagnosis not present

## 2017-07-05 HISTORY — DX: Type 2 diabetes mellitus without complications: E11.9

## 2017-07-05 MED ORDER — CAPSAICIN 0.025 % EX CREA: TOPICAL_CREAM | Freq: Two times a day (BID) | CUTANEOUS | 0 refills | Status: DC

## 2017-07-05 MED ORDER — TRAMADOL HCL 50 MG PO TABS
50.0000 mg | ORAL_TABLET | Freq: Once | ORAL | Status: AC
  Administered 2017-07-05: 50 mg via ORAL
  Filled 2017-07-05: qty 1

## 2017-07-05 MED ORDER — NAPROXEN 500 MG PO TABS
500.0000 mg | ORAL_TABLET | Freq: Two times a day (BID) | ORAL | 0 refills | Status: DC
Start: 2017-07-05 — End: 2019-04-12

## 2017-07-05 NOTE — ED Triage Notes (Signed)
Per Pt, Pt is coming from working upstairs where she reports worsening right ankle and foot pain. Pt reports change in ambulation amount with right foot hurting worse with ambulation. Taking home medications with no relief.

## 2017-07-05 NOTE — ED Provider Notes (Signed)
[facility] DEPT Provider Note   CSN: [id number] Arrival date & time: 07/05/17  0945   By signing my name below, I, Soijett Blue, attest that this documentation has been prepared under the direction and in the presence of Pearlie Oyster, PA-C Electronically Signed: [place], ED Scribe. 07/05/17. 11:24 AM.  History   Chief Complaint Chief Complaint  Patient presents with  . Ankle Pain    HPI Deborah [name] is a 66 y.o. female with a PMHx of HTN, DM, who presents to the Emergency Department complaining of throbbing, constant, right ankle/foot pain onset 1 week ago. Pt reports associated numbness/tingling isolated to right foot. Pt has tried aleve and tylenol with relief of her symptoms. Pt right ankle pain is worse with standing after prolonged sitting. 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 She denies color change, wound, swelling, and any other symptoms. Denies history of prior injuries or known diagnosis.    The history is provided by the patient. No language interpreter was used.    Past Medical History:  Diagnosis Date  . Death of child 40   Due to cord strangulation.  . Diabetes mellitus without complication ([place])   . Hypertension     Patient Active Problem List   Diagnosis Date Noted  . High blood pressure 06/08/2011  . SOB (shortness of breath) 06/08/2011  . Generalized headaches 06/08/2011  . Weight loss 06/08/2011  . Menopause 06/08/2011    Past Surgical History:  Procedure Laterality Date  . APPENDECTOMY  1974  . CARDIAC CATHETERIZATION     two, dates not provided.  . CHOLECYSTECTOMY     confirm date with patient, was it 14?  Marland Kitchen LAPAROSCOPIC TUBAL LIGATION  1987    OB History    No data available       Home Medications    Prior to Admission medications   Medication Sig Start Date End Date Taking? Authorizing Provider  albuterol (PROVENTIL HFA;VENTOLIN HFA) 108 (90 BASE) MCG/ACT inhaler Inhale 2 puffs into the lungs  every 4 (four) hours as needed for wheezing or shortness of breath. 09/06/14   Janne Napoleon, NP  aliskiren (TEKTURNA) 150 MG tablet Take 150 mg by mouth daily.      [provider]  capsaicin (ZOSTRIX) 0.025 % cream Apply topically 2 (two) times daily. 07/05/17   Tarina Volk, Ozella Almond, PA-C  Chlorphen-PE-Acetaminophen 4-10-325 MG TABS 1 tab po q 6 h prn drainage and congestion 09/06/14   Janne Napoleon, NP  naproxen (NAPROSYN) 500 MG tablet Take 1 tablet (500 mg total) by mouth 2 (two) times daily. 07/05/17   Damyiah Moxley, Ozella Almond, PA-C  predniSONE (DELTASONE) 20 MG tablet Take 3 tabs po on first day, 2 tabs second day, 2 tabs third day, 1 tab fourth day, 1 tab 5th day. Take with food. 09/06/14   Janne Napoleon, NP  valsartan-hydrochlorothiazide (DIOVAN-HCT) 320-25 MG per tablet Take 1 tablet by mouth daily.      [provider]    Family History Family History  Problem Relation Age of Onset  . COPD Mother   . Diabetes Mother   . Hypertension Mother   . Heart disease Father   . Hypertension Father     Social History Social History  Substance Use Topics  . Smoking status: Never Smoker  . Smokeless tobacco: Never Used  . Alcohol use Yes     Comment: glass of wine or mixed drink on occasion.     Allergies  Erythromycin; Ibuprofen; and Zantac [ranitidine hcl]   Review of Systems Review of Systems  Musculoskeletal: Positive for arthralgias (right ankle and right foot). Negative for joint swelling.  Skin: Negative for color change and wound.  Neurological: Positive for numbness (right foot).       +tingling to right foot     Physical Exam Updated Vital Signs BP (!) 161/99 (BP Location: Left Arm)   Pulse 88   Temp 97.8 F (36.6 C) (Oral)   Resp 14   Ht 5\' 6"  (1.676 m)   Wt 220 lb (99.8 kg)   SpO2 96%   BMI 35.51 kg/m   Physical Exam  Constitutional: She appears well-developed and well-nourished. No distress.  HENT:  Head: Normocephalic and atraumatic.  Neck:  Neck supple.  Cardiovascular: Normal rate, regular rhythm and normal heart sounds.   No murmur heard. Pulmonary/Chest: Effort normal and breath sounds normal. No respiratory distress. She has no wheezes. She has no rales.  Musculoskeletal: Normal range of motion.  Right foot with tenderness to palpation of the dorsum of the foot. No swelling. Full ROM. No erythema, ecchymosis, or deformity appreciated. No break in skin. Achilles intact. Good pedal pulse and cap refill of toes.Sensation grossly intact.  Neurological: She is alert.  Skin: Skin is warm and dry.  Nursing note and vitals reviewed.    ED Treatments / Results  DIAGNOSTIC STUDIES: Oxygen Saturation is 96% on RA, nl by my interpretation.    COORDINATION OF CARE: 11:22 AM Discussed treatment plan with pt at bedside and pt agreed to plan.   Labs (all labs ordered are listed, but only abnormal results are displayed) Labs Reviewed - No data to display  EKG  EKG Interpretation None       Radiology Dg Ankle Complete Right  Result Date: 07/05/2017 CLINICAL DATA:  Right foot and ankle pain for 1 week. No known injury. Initial encounter. EXAM: RIGHT ANKLE - COMPLETE 3+ VIEW COMPARISON:  None. FINDINGS: There is no evidence of fracture, dislocation, or joint effusion. There is no evidence of arthropathy or other focal bone abnormality. Soft tissues are unremarkable. IMPRESSION: Negative exam. Electronically Signed   By: Inge Rise M.D.   On: 07/05/2017 10:30   Dg Foot Complete Right  Result Date: 07/05/2017 CLINICAL DATA:  Right foot and ankle pain for 1 week. No known injury. Initial encounter. EXAM: RIGHT FOOT COMPLETE - 3+ VIEW COMPARISON:  None. FINDINGS: The patient has a severe hallux valgus. Advanced degenerative disease is seen at the first second and third tarsometatarsal joints. Moderate first MTP osteoarthritis is also seen. No acute bony or joint abnormality is seen. Soft tissues are unremarkable. IMPRESSION: No  acute abnormality. Severe hallux valgus. Advanced osteoarthritis second and third tarsometatarsal joints. Moderate first MTP osteoarthritis also seen. Electronically Signed   By: Inge Rise M.D.   On: 07/05/2017 10:30    Procedures Procedures (including critical care time)  Medications Ordered in ED Medications  traMADol (ULTRAM) tablet 50 mg (50 mg Oral Given 07/05/17 1044)     Initial Impression / Assessment and Plan / ED Course  I have reviewed the triage vital signs and the nursing notes.  Pertinent labs & imaging results that were available during my care of the patient were reviewed by me and considered in my medical decision making (see chart for details).     Deborah [name] is a 66 y.o. female who presents to ED for persistent right foot pain. No injury. X-ray c/w arthritis. History  and exam c/w arthritis as well. Home care instructions and conservative measures discussed. Patient will follow up with PCP for further discussion of diagnosis. Patient given ankle sleeve for comfort. Returns precautions discussed. Pt appears safe for discharge.  Patient discussed with Dr. Billy Fischer who agrees with treatment plan.    Final Clinical Impressions(s) / ED Diagnoses   Final diagnoses:  Primary osteoarthritis of right ankle    New Prescriptions Discharge Medication List as of 07/05/2017 11:34 AM    START taking these medications   Details  capsaicin (ZOSTRIX) 0.025 % cream Apply topically 2 (two) times daily., Starting Tue 07/05/2017, Print    naproxen (NAPROSYN) 500 MG tablet Take 1 tablet (500 mg total) by mouth 2 (two) times daily., Starting Tue 07/05/2017, Print       I personally performed the services described in this documentation, which was scribed in my presence. The recorded information has been reviewed and is accurate.     Gram Siedlecki, Ozella Almond, PA-C 07/05/17 1250    Gareth Morgan, MD 07/06/17 1355

## 2017-07-05 NOTE — Discharge Instructions (Signed)
Naproxen twice daily. Tylenol every 8 hours as needed. Ice or heat for additional pain relief. Capsaicin cream to affected area for additional pain relief.   Please follow up with your primary care provider for further discussion of today's visit. They may alter your medication regimen to make it work better for you.   Return to ER for new or worsening symptoms, any additional concerns.

## 2017-07-05 NOTE — ED Notes (Signed)
Pt will be brought to room by xray.

## 2017-07-05 NOTE — ED Notes (Signed)
Patient currently in xray ?

## 2017-07-06 LAB — CBG MONITORING, ED: GLUCOSE-CAPILLARY: 125 mg/dL — AB (ref 65–99)

## 2017-07-06 MED FILL — NAPROXEN 500 MG TABLET: 500 | 15 days supply | Qty: 30 | Fill #0

## 2017-07-20 MED FILL — HYDROCHLOROTHIAZIDE 25 MG T: 25 | 30 days supply | Qty: 30 | Fill #1

## 2017-07-20 MED FILL — GLIMEPIRIDE 2 MG TABLET: 2 | 30 days supply | Qty: 30 | Fill #1

## 2017-07-20 MED FILL — SYNTHROID 100 MCG TABLET: 100 | 30 days supply | Qty: 30 | Fill #2

## 2017-07-21 MED FILL — hydrALAZINE HCL 50 MG TABS: 50 | 30 days supply | Qty: 30 | Fill #0

## 2017-08-17 MED FILL — hydrALAZINE HCL 50 MG TABS: 50 | 30 days supply | Qty: 30 | Fill #1

## 2017-08-17 MED FILL — GLIMEPIRIDE 2 MG TABLET: 2 | 30 days supply | Qty: 30 | Fill #2

## 2017-08-17 MED FILL — HYDROCHLOROTHIAZIDE 25 MG T: 25 | 30 days supply | Qty: 30 | Fill #2

## 2017-08-25 MED FILL — SYNTHROID 100 MCG TABLET: 100 | 30 days supply | Qty: 30 | Fill #0

## 2017-09-21 DIAGNOSIS — J209 Acute bronchitis, unspecified: Secondary | ICD-10-CM | POA: Diagnosis not present

## 2017-09-21 MED FILL — HYDROCODONE-CHLORPHEN ER SU: 10-8 | 18 days supply | Qty: 180 | Fill #0

## 2017-09-21 MED FILL — AZITHROMYCIN 250 MG TABLET: 250 | 5 days supply | Qty: 6 | Fill #0

## 2017-09-21 MED FILL — GLIMEPIRIDE 2 MG TABLET: 2 | 30 days supply | Qty: 30 | Fill #0

## 2017-09-21 MED FILL — IPRAT-ALBUT 0.5-3(2.5) MG/3: 0.5-2.5 (3) | 15 days supply | Qty: 270 | Fill #0

## 2017-09-21 MED FILL — HYDROCHLOROTHIAZIDE 25 MG T: 25 | 30 days supply | Qty: 30 | Fill #0

## 2017-09-22 DIAGNOSIS — J209 Acute bronchitis, unspecified: Secondary | ICD-10-CM | POA: Diagnosis not present

## 2017-09-22 MED FILL — hydrALAZINE HCL 50 MG TABS: 50 | 30 days supply | Qty: 30 | Fill #2

## 2017-09-26 MED FILL — SYNTHROID 100 MCG TABLET: 100 | 30 days supply | Qty: 30 | Fill #1

## 2017-10-05 ENCOUNTER — Other Ambulatory Visit: Payer: Self-pay | Admitting: Nurse Practitioner

## 2017-10-05 DIAGNOSIS — R05 Cough: Secondary | ICD-10-CM | POA: Diagnosis not present

## 2017-10-05 DIAGNOSIS — E1165 Type 2 diabetes mellitus with hyperglycemia: Secondary | ICD-10-CM | POA: Diagnosis not present

## 2017-10-05 DIAGNOSIS — Z1231 Encounter for screening mammogram for malignant neoplasm of breast: Secondary | ICD-10-CM | POA: Diagnosis not present

## 2017-10-05 DIAGNOSIS — E890 Postprocedural hypothyroidism: Secondary | ICD-10-CM | POA: Diagnosis not present

## 2017-10-05 DIAGNOSIS — Z0000 Encounter for general adult medical examination without abnormal findings: Secondary | ICD-10-CM | POA: Diagnosis not present

## 2017-10-05 MED FILL — OXYBUTYNIN 5 MG TABLET: 5 | 30 days supply | Qty: 30 | Fill #0

## 2017-10-20 MED FILL — HYDROCHLOROTHIAZIDE 25 MG T: 25 | 30 days supply | Qty: 30 | Fill #1

## 2017-10-20 MED FILL — GLIMEPIRIDE 2 MG TABLET: 2 | 30 days supply | Qty: 30 | Fill #1

## 2017-10-20 MED FILL — hydrALAZINE HCL 50 MG TABS: 50 | 30 days supply | Qty: 30 | Fill #0

## 2017-10-21 MED FILL — VIT D2 1.25 MG (50,000 UNIT: 1.25 MG | 28 days supply | Qty: 8 | Fill #0

## 2017-10-22 DIAGNOSIS — J209 Acute bronchitis, unspecified: Secondary | ICD-10-CM | POA: Diagnosis not present

## 2017-10-28 ENCOUNTER — Ambulatory Visit
Admission: RE | Admit: 2017-10-28 | Discharge: 2017-10-28 | Disposition: A | Discharge status: Home / Self Care | Payer: Medicare Other | Source: Ambulatory Visit | Attending: Nurse Practitioner | Admitting: Nurse Practitioner

## 2017-10-28 DIAGNOSIS — Z1231 Encounter for screening mammogram for malignant neoplasm of breast: Secondary | ICD-10-CM | POA: Diagnosis not present

## 2017-11-16 MED FILL — VIT D2 1.25 MG (50,000 UNIT: 1.25 MG | 28 days supply | Qty: 8 | Fill #1

## 2017-11-21 DIAGNOSIS — J209 Acute bronchitis, unspecified: Secondary | ICD-10-CM | POA: Diagnosis not present

## 2017-11-21 MED FILL — hydrALAZINE HCL 50 MG TABS: 50 | 30 days supply | Qty: 30 | Fill #1

## 2017-11-21 MED FILL — HYDROCHLOROTHIAZIDE 25 MG T: 25 | 30 days supply | Qty: 30 | Fill #2

## 2017-11-21 MED FILL — GLIMEPIRIDE 2 MG TABLET: 2 | 30 days supply | Qty: 30 | Fill #2

## 2017-12-07 MED FILL — SYNTHROID 100 MCG TABLET: 100 | 30 days supply | Qty: 30 | Fill #2

## 2017-12-15 MED FILL — VIT D2 1.25 MG (50,000 UNIT: 1.25 MG | 28 days supply | Qty: 8 | Fill #2

## 2017-12-22 DIAGNOSIS — J40 Bronchitis, not specified as acute or chronic: Secondary | ICD-10-CM | POA: Diagnosis not present

## 2017-12-22 DIAGNOSIS — J209 Acute bronchitis, unspecified: Secondary | ICD-10-CM | POA: Diagnosis not present

## 2017-12-22 MED FILL — hydrALAZINE HCL 50 MG TABS: 50 | 30 days supply | Qty: 30 | Fill #2

## 2017-12-22 MED FILL — HYDROCHLOROTHIAZIDE 25 MG T: 25 | 30 days supply | Qty: 30 | Fill #0

## 2017-12-22 MED FILL — GLIMEPIRIDE 2 MG TABLET: 2 | 30 days supply | Qty: 30 | Fill #0

## 2018-01-09 MED FILL — SYNTHROID 100 MCG TABLET: 100 | 30 days supply | Qty: 30 | Fill #3

## 2018-01-19 MED FILL — hydrALAZINE HCL 50 MG TABS: 50 | 30 days supply | Qty: 30 | Fill #0

## 2018-01-22 DIAGNOSIS — J40 Bronchitis, not specified as acute or chronic: Secondary | ICD-10-CM | POA: Diagnosis not present

## 2018-01-22 DIAGNOSIS — J209 Acute bronchitis, unspecified: Secondary | ICD-10-CM | POA: Diagnosis not present

## 2018-01-23 MED FILL — GLIMEPIRIDE 2 MG TABLET: 2 | 30 days supply | Qty: 30 | Fill #1

## 2018-01-30 MED FILL — HYDROCHLOROTHIAZIDE 25 MG T: 25 | 30 days supply | Qty: 30 | Fill #1

## 2018-02-09 MED FILL — SYNTHROID 100 MCG TABLET: 100 | 30 days supply | Qty: 30 | Fill #4

## 2018-02-09 MED FILL — VIT D2 1.25 MG (50,000 UNIT: 1.25 MG | 28 days supply | Qty: 8 | Fill #3

## 2018-02-19 DIAGNOSIS — J40 Bronchitis, not specified as acute or chronic: Secondary | ICD-10-CM | POA: Diagnosis not present

## 2018-02-19 DIAGNOSIS — J209 Acute bronchitis, unspecified: Secondary | ICD-10-CM | POA: Diagnosis not present

## 2018-02-20 MED FILL — hydrALAZINE HCL 50 MG TABS: 50 | 30 days supply | Qty: 30 | Fill #1

## 2018-02-20 MED FILL — GLIMEPIRIDE 2 MG TABLET: 2 | 30 days supply | Qty: 30 | Fill #2

## 2018-03-01 MED FILL — HYDROCHLOROTHIAZIDE 25 MG T: 25 | 30 days supply | Qty: 30 | Fill #2

## 2018-03-07 MED FILL — VIT D2 1.25 MG (50,000 UNIT: 1.25 MG | 28 days supply | Qty: 8 | Fill #0 | Status: TO

## 2018-03-22 DIAGNOSIS — J40 Bronchitis, not specified as acute or chronic: Secondary | ICD-10-CM | POA: Diagnosis not present

## 2018-03-22 DIAGNOSIS — J209 Acute bronchitis, unspecified: Secondary | ICD-10-CM | POA: Diagnosis not present

## 2018-03-22 MED FILL — hydrALAZINE HCL 50 MG TABS: 50 | 30 days supply | Qty: 30 | Fill #2

## 2018-03-22 MED FILL — SYNTHROID 100 MCG TABLET: 100 | 30 days supply | Qty: 30 | Fill #5

## 2018-03-22 MED FILL — GLIMEPIRIDE 2 MG TABLET: 2 | 30 days supply | Qty: 30 | Fill #0 | Status: TO

## 2018-04-04 MED FILL — HYDROCHLOROTHIAZIDE 25 MG T: 25 | 30 days supply | Qty: 30 | Fill #0 | Status: TO

## 2018-04-21 DIAGNOSIS — J40 Bronchitis, not specified as acute or chronic: Secondary | ICD-10-CM | POA: Diagnosis not present

## 2018-04-21 DIAGNOSIS — J209 Acute bronchitis, unspecified: Secondary | ICD-10-CM | POA: Diagnosis not present

## 2018-05-16 ENCOUNTER — Ambulatory Visit (HOSPITAL_COMMUNITY)
Admission: RE | Admit: 2018-05-16 | Discharge: 2018-05-16 | Disposition: A | Discharge status: Home / Self Care | Payer: Medicare Other | Source: Ambulatory Visit | Attending: Family | Admitting: Family

## 2018-05-16 ENCOUNTER — Ambulatory Visit (HOSPITAL_COMMUNITY)
Admission: RE | Admit: 2018-05-16 | Discharge: 2018-05-16 | Disposition: A | Discharge status: Home / Self Care | Payer: Medicare Other | Source: Ambulatory Visit | Attending: Vascular Surgery | Admitting: Vascular Surgery

## 2018-05-16 ENCOUNTER — Encounter (HOSPITAL_COMMUNITY): Payer: Self-pay

## 2018-05-16 ENCOUNTER — Other Ambulatory Visit (HOSPITAL_COMMUNITY): Payer: Self-pay | Admitting: Nurse Practitioner

## 2018-05-16 DIAGNOSIS — R252 Cramp and spasm: Secondary | ICD-10-CM

## 2018-05-17 DIAGNOSIS — R7309 Other abnormal glucose: Secondary | ICD-10-CM | POA: Diagnosis not present

## 2018-05-17 DIAGNOSIS — I10 Essential (primary) hypertension: Secondary | ICD-10-CM | POA: Diagnosis not present

## 2018-05-17 DIAGNOSIS — R252 Cramp and spasm: Secondary | ICD-10-CM | POA: Diagnosis not present

## 2018-05-17 DIAGNOSIS — E1165 Type 2 diabetes mellitus with hyperglycemia: Secondary | ICD-10-CM | POA: Diagnosis not present

## 2018-05-17 DIAGNOSIS — R05 Cough: Secondary | ICD-10-CM | POA: Diagnosis not present

## 2018-05-17 LAB — LIPID PANEL
Cholesterol: 226 — AB (ref 0–200)
HDL: 52 (ref 35–70)
LDL Cholesterol: 158
LDl/HDL Ratio: 3
Triglycerides: 78 (ref 40–160)

## 2018-05-17 LAB — HEMOGLOBIN A1C: HEMOGLOBIN A1C: 6.2

## 2018-05-17 LAB — VITAMIN D 25 HYDROXY (VIT D DEFICIENCY, FRACTURES): Vit D, 25-Hydroxy: 46.1

## 2018-05-17 LAB — BASIC METABOLIC PANEL WITH GFR
BUN: 14 (ref 4–21)
Creatinine: 0.7 (ref 0.5–1.1)
Glucose: 90
Potassium: 4 (ref 3.4–5.3)
Sodium: 140 (ref 137–147)

## 2018-05-17 LAB — HEPATIC FUNCTION PANEL
ALT: 23 (ref 7–35)
AST: 18 (ref 13–35)

## 2018-05-22 DIAGNOSIS — J40 Bronchitis, not specified as acute or chronic: Secondary | ICD-10-CM | POA: Diagnosis not present

## 2018-05-22 DIAGNOSIS — J209 Acute bronchitis, unspecified: Secondary | ICD-10-CM | POA: Diagnosis not present

## 2018-07-27 ENCOUNTER — Institutional Professional Consult (permissible substitution): Payer: Self-pay | Admitting: Neurology

## 2018-09-16 ENCOUNTER — Encounter: Payer: Self-pay | Admitting: Internal Medicine

## 2018-09-16 DIAGNOSIS — E119 Type 2 diabetes mellitus without complications: Secondary | ICD-10-CM | POA: Insufficient documentation

## 2018-09-20 ENCOUNTER — Other Ambulatory Visit: Payer: Self-pay | Admitting: Nurse Practitioner

## 2018-09-21 ENCOUNTER — Other Ambulatory Visit: Payer: Self-pay | Admitting: Nurse Practitioner

## 2018-09-28 ENCOUNTER — Other Ambulatory Visit: Payer: Self-pay | Admitting: Nurse Practitioner

## 2018-09-29 ENCOUNTER — Other Ambulatory Visit: Payer: Self-pay | Admitting: Nurse Practitioner

## 2018-09-29 DIAGNOSIS — E119 Type 2 diabetes mellitus without complications: Secondary | ICD-10-CM

## 2018-09-29 MED ORDER — PITAVASTATIN CALCIUM 2 MG PO TABS: 2.0000 mg | ORAL_TABLET | Freq: Every day | ORAL | 1 refills | Status: DC

## 2018-09-29 MED ORDER — ATORVASTATIN CALCIUM 10 MG PO TABS: 10.0000 mg | ORAL_TABLET | Freq: Every day | ORAL | 1 refills | Status: DC

## 2018-10-03 ENCOUNTER — Other Ambulatory Visit: Payer: Self-pay | Admitting: Nurse Practitioner

## 2018-10-05 ENCOUNTER — Other Ambulatory Visit: Payer: Self-pay

## 2018-10-05 DIAGNOSIS — E039 Hypothyroidism, unspecified: Secondary | ICD-10-CM

## 2018-10-05 MED ORDER — LEVOTHYROXINE SODIUM 100 MCG PO TABS: 100.0000 ug | ORAL_TABLET | Freq: Every day | ORAL | 1 refills | Status: DC

## 2018-10-06 ENCOUNTER — Other Ambulatory Visit: Payer: Self-pay

## 2018-10-06 ENCOUNTER — Telehealth: Payer: Self-pay

## 2018-10-06 DIAGNOSIS — E039 Hypothyroidism, unspecified: Secondary | ICD-10-CM

## 2018-10-06 NOTE — Telephone Encounter (Signed)
Returned pt call regarding the levothyroxine and advised her that I spoke with the pharmacy and they stated they switched her medication back to synthroid. YRL,RMA

## 2018-10-11 ENCOUNTER — Ambulatory Visit
Admission: RE | Admit: 2018-10-11 | Discharge: 2018-10-11 | Disposition: A | Discharge status: Home / Self Care | Payer: Medicare Other | Source: Ambulatory Visit | Attending: Internal Medicine | Admitting: Internal Medicine

## 2018-10-11 ENCOUNTER — Ambulatory Visit (INDEPENDENT_AMBULATORY_CARE_PROVIDER_SITE_OTHER): Payer: Medicare Other

## 2018-10-11 ENCOUNTER — Ambulatory Visit (INDEPENDENT_AMBULATORY_CARE_PROVIDER_SITE_OTHER): Payer: Medicare Other | Admitting: Internal Medicine

## 2018-10-11 VITALS — BP 142/82 | HR 86 | Temp 97.6°F | Ht 67.0 in | Wt 217.0 lb

## 2018-10-11 DIAGNOSIS — Z23 Encounter for immunization: Secondary | ICD-10-CM | POA: Diagnosis not present

## 2018-10-11 DIAGNOSIS — E039 Hypothyroidism, unspecified: Secondary | ICD-10-CM

## 2018-10-11 DIAGNOSIS — E1165 Type 2 diabetes mellitus with hyperglycemia: Secondary | ICD-10-CM | POA: Diagnosis not present

## 2018-10-11 DIAGNOSIS — M89311 Hypertrophy of bone, right shoulder: Secondary | ICD-10-CM

## 2018-10-11 DIAGNOSIS — E559 Vitamin D deficiency, unspecified: Secondary | ICD-10-CM | POA: Diagnosis not present

## 2018-10-11 DIAGNOSIS — E119 Type 2 diabetes mellitus without complications: Secondary | ICD-10-CM

## 2018-10-11 DIAGNOSIS — Z1159 Encounter for screening for other viral diseases: Secondary | ICD-10-CM

## 2018-10-11 DIAGNOSIS — M89319 Hypertrophy of bone, unspecified shoulder: Secondary | ICD-10-CM

## 2018-10-11 DIAGNOSIS — Z0000 Encounter for general adult medical examination without abnormal findings: Secondary | ICD-10-CM | POA: Diagnosis not present

## 2018-10-11 LAB — POCT URINALYSIS DIPSTICK
BILIRUBIN UA: NEGATIVE
GLUCOSE UA: NEGATIVE
Ketones, UA: NEGATIVE
LEUKOCYTES UA: NEGATIVE
NITRITE UA: NEGATIVE
PH UA: 7.5 (ref 5.0–8.0)
Protein, UA: NEGATIVE
RBC UA: NEGATIVE
Spec Grav, UA: 1.02 (ref 1.010–1.025)
UROBILINOGEN UA: 0.2 U/dL

## 2018-10-11 LAB — POCT UA - MICROALBUMIN
CREATININE, POC: 100 mg/dL
MICROALBUMIN (UR) POC: 10 mg/L

## 2018-10-11 NOTE — Progress Notes (Signed)
Subjective:   Deborah [name] is a 67 y.o. female who presents for an Initial Medicare Annual Wellness Visit.  Review of Systems    n/a  Cardiac Risk Factors include: advanced age (>31men, >46 women);diabetes mellitus;hypertension;obesity (BMI >30kg/m2)     Objective:    Today's Vitals   10/11/18 0959  BP: (!) 142/82  Pulse: 86  Temp: 97.6 F (36.4 C)  SpO2: 97%  Weight: 217 lb (98.4 kg)  Height: 5\' 7"  (1.702 m)  PainSc: 0-No pain   Body mass index is 33.99 kg/m.  Advanced Directives 10/11/2018 07/05/2017 07/17/2015  Does Patient Have a Medical Advance Directive? No No No  Would patient like information on creating a medical advance directive? Yes (MAU/Ambulatory/Procedural Areas - Information given) - No - patient declined information    Current Medications (verified) Outpatient Encounter Medications as of 10/11/2018  Medication Sig  . cholecalciferol (VITAMIN D) 1000 units tablet Take 2,000 Units by mouth daily.  Marland Kitchen docusate sodium (COLACE) 100 MG capsule Take 100 mg by mouth daily.  Marland Kitchen glimepiride (AMARYL) 2 MG tablet TAKE ONE TABLET (2MG ) BY MOUTH EVERY DAY  . hydrALAZINE (APRESOLINE) 50 MG tablet TAKE ONE TABLET (50MG ) BY MOUTH EVERY DAY WITH FOOD  . hydrochlorothiazide (HYDRODIURIL) 25 MG tablet TAKE ONE TABLET (25MG ) BY MOUTH EVERY DAY  . levothyroxine (SYNTHROID, LEVOTHROID) 100 MCG tablet Take 1 tablet (100 mcg total) by mouth daily before breakfast.  . magnesium 30 MG tablet Take 30 mg by mouth once.  Marland Kitchen omega-3 acid ethyl esters (LOVAZA) 1 g capsule Take by mouth 3 (three) times a week.  . vitamin B-12 (CYANOCOBALAMIN) 100 MCG tablet Take 100 mcg by mouth daily.  Marland Kitchen albuterol (PROVENTIL HFA;VENTOLIN HFA) 108 (90 BASE) MCG/ACT inhaler Inhale 2 puffs into the lungs every 4 (four) hours as needed for wheezing or shortness of breath. (Patient not taking: Reported on 10/11/2018)  . aliskiren (TEKTURNA) 150 MG tablet Take 150 mg by mouth daily.    Marland Kitchen atorvastatin  (LIPITOR) 10 MG tablet Take 1 tablet (10 mg total) by mouth daily. (Patient not taking: Reported on 10/11/2018)  . capsaicin (ZOSTRIX) 0.025 % cream Apply topically 2 (two) times daily. (Patient not taking: Reported on 10/11/2018)  . Chlorphen-PE-Acetaminophen 4-10-325 MG TABS 1 tab po q 6 h prn drainage and congestion (Patient not taking: Reported on 10/11/2018)  . cyclobenzaprine (FLEXERIL) 10 MG tablet Take 10 mg by mouth 3 (three) times daily as needed for muscle spasms.  . naproxen (NAPROSYN) 500 MG tablet Take 1 tablet (500 mg total) by mouth 2 (two) times daily. (Patient not taking: Reported on 10/11/2018)  . predniSONE (DELTASONE) 20 MG tablet Take 3 tabs po on first day, 2 tabs second day, 2 tabs third day, 1 tab fourth day, 1 tab 5th day. Take with food. (Patient not taking: Reported on 10/11/2018)  . valsartan-hydrochlorothiazide (DIOVAN-HCT) 320-25 MG per tablet Take 1 tablet by mouth daily.     No facility-administered encounter medications on file as of 10/11/2018.     Allergies (verified) Aspartame and phenylalanine; Erythromycin; Ibuprofen; Phenylalanine; Prilosec [omeprazole]; Tramadol; and Zantac [ranitidine hcl]   History: Past Medical History:  Diagnosis Date  . Death of child 60   Due to cord strangulation.  . Diabetes mellitus without complication ([place])   . Hypertension    Past Surgical History:  Procedure Laterality Date  . APPENDECTOMY  1974  . CARDIAC CATHETERIZATION     two, dates not provided.  . CHOLECYSTECTOMY  confirm date with patient, was it 32?  Marland Kitchen LAPAROSCOPIC TUBAL LIGATION  1987   Family History  Problem Relation Age of Onset  . COPD Mother   . Diabetes Mother   . Hypertension Mother   . Heart disease Father   . Hypertension Father    Social History   Socioeconomic History  . Marital status: Married    Spouse name: Not on file  . Number of children: Not on file  . Years of education: Not on file  . Highest education level: Not on  file  Occupational History  . Occupation: retired  Scientific laboratory technician  . Financial resource strain: Not hard at all  . Food insecurity:    Worry: Never true    Inability: Never true  . Transportation needs:    Medical: No    Non-medical: No  Tobacco Use  . Smoking status: Never Smoker  . Smokeless tobacco: Never Used  Substance and Sexual Activity  . Alcohol use: Yes    Comment: glass of wine or mixed drink on occasion.  . Drug use: No  . Sexual activity: Not Currently  Lifestyle  . Physical activity:    Days per week: 3 days    Minutes per session: 60 min  . Stress: Not at all  Relationships  . Social connections:    Talks on phone: Not on file    Gets together: Not on file    Attends religious service: Not on file    Active member of club or organization: Not on file    Attends meetings of clubs or organizations: Not on file    Relationship status: Not on file  Other Topics Concern  . Not on file  Social History Narrative  . Not on file    Tobacco Counseling Counseling given: Not Answered   Clinical Intake:  Pre-visit preparation completed: Yes  Pain : No/denies pain Pain Score: 0-No pain     Nutritional Status: BMI > 30  Obese Diabetes: Yes CBG done?: No Did pt. bring in CBG monitor from home?: No  How often do you need to have someone help you when you read instructions, pamphlets, or other written materials from your doctor or pharmacy?: 1 - Never What is the last grade level you completed in school?: BSN  Interpreter Needed?: No  Information entered by :: NAllen LPN   Activities of Daily Living In your present state of health, do you have any difficulty performing the following activities: 10/11/2018  Hearing? N  Vision? N  Difficulty concentrating or making decisions? N  Walking or climbing stairs? N  Dressing or bathing? N  Doing errands, shopping? N  Preparing Food and eating ? N  Using the Toilet? N  In the past six months, have you  accidently leaked urine? Y  Comment has leakage all the time  Do you have problems with loss of bowel control? N  Managing your Medications? N  Managing your Finances? N  Housekeeping or managing your Housekeeping? N  Some recent data might be hidden     Immunizations and Health Maintenance Immunization History  Administered Date(s) Administered  . Influenza, High Dose Seasonal PF 10/11/2018   Health Maintenance Due  Topic Date Due  . Hepatitis C Screening  1951-06-15  . FOOT EXAM  05/23/1962  . OPHTHALMOLOGY EXAM  05/23/1962  . TETANUS/TDAP  05/24/1971  . COLONOSCOPY  05/23/2002  . DEXA SCAN  05/23/2017  . PNA vac Low Risk Adult (1 of 2 -  PCV13) 05/23/2017    Patient Care Team: Glendale Chard, MD as PCP - General (Internal Medicine)  Indicate any recent Medical Services you may have received from other than [facility] providers in the past year (date may be approximate).     Assessment:   This is a routine wellness examination for Deborah [name].  Hearing/Vision screen  Visual Acuity Screening   Right eye Left eye Both eyes  Without correction: 20/30 20/30 20/30   With correction:     Comments: Does not have annual eye exams   Dietary issues and exercise activities discussed: Current Exercise Habits: Structured exercise class, Type of exercise: calisthenics, Time (Minutes): 60, Frequency (Times/Week): 3, Weekly Exercise (Minutes/Week): 180, Intensity: Intense, Exercise limited by: None identified  Goals    . Weight (lb) < 200 lb (90.7 kg) (pt-stated)     Going to silver sneakers 3 times a week      Depression Screen PHQ 2/9 Scores 10/11/2018  PHQ - 2 Score 0  PHQ- 9 Score 3    Fall Risk Fall Risk  10/11/2018  Risk for fall due to : Medication side effect    Is the patient's home free of loose throw rugs in walkways, pet beds, electrical cords, etc?   yes      Grab bars in the bathroom? yes      Handrails on the stairs?   n/a      Adequate lighting?   yes  Timed  Get Up and Go Performed n/a  Cognitive Function:     6CIT Screen 10/11/2018  What Year? 0 points  What month? 0 points  What time? 0 points  Count back from 20 0 points  Months in reverse 0 points  Repeat phrase 0 points  Total Score 0    Screening Tests Health Maintenance  Topic Date Due  . Hepatitis C Screening  September 27, 1951  . FOOT EXAM  05/23/1962  . OPHTHALMOLOGY EXAM  05/23/1962  . TETANUS/TDAP  05/24/1971  . COLONOSCOPY  05/23/2002  . DEXA SCAN  05/23/2017  . PNA vac Low Risk Adult (1 of 2 - PCV13) 05/23/2017  . HEMOGLOBIN A1C  11/17/2018  . MAMMOGRAM  10/29/2019  . INFLUENZA VACCINE  Completed    Qualifies for Shingles Vaccine? yes  Cancer Screenings: Lung: Low Dose CT Chest recommended if Age 29-80 years, 30 pack-year currently smoking OR have quit w/in 15years. Patient does not qualify. Breast: Up to date on Mammogram? Yes   Up to date of Bone Density/Dexa? Yes Colorectal: due  Additional Screenings:  Hepatitis C Screening:      Plan:    Patient goal is to lose weight. She has been going to silver sneakers 3 times a week.  I have personally reviewed and noted the following in the patient's chart:   . Medical and social history . Use of alcohol, tobacco or illicit drugs  . Current medications and supplements . Functional ability and status . Nutritional status . Physical activity . Advanced directives . List of other physicians . Hospitalizations, surgeries, and ER visits in previous 12 months . Vitals . Screenings to include cognitive, depression, and falls . Referrals and appointments  In addition, I have reviewed and discussed with patient certain preventive protocols, quality metrics, and best practice recommendations. A written personalized care plan for preventive services as well as general preventive health recommendations were provided to patient.     Kellie Simmering, LPN   [id number]

## 2018-10-11 NOTE — Patient Instructions (Addendum)
Ms. Deborah [name] , Thank you for taking time to come for your Medicare Wellness Visit. I appreciate your ongoing commitment to your health goals. Please review the following plan we discussed and let me know if I can assist you in the future.   Screening recommendations/referrals: Colonoscopy: due Mammogram: up to date Bone Density: to be ordered Recommended yearly ophthalmology/optometry visit for glaucoma screening and checkup Recommended yearly dental visit for hygiene and checkup  Vaccinations: Influenza vaccine: today Pneumococcal vaccine: prevnar 13 to be ordered Tdap vaccine: to be ordered Shingles vaccine:     Advanced directives: Advance directive discussed with you today. I have provided a copy for you to complete at home and have notarized. Once this is complete please bring a copy in to our office so we can scan it into your chart.   Conditions/risks identified: Obesity: patient goes to silver sneakers 3 times a week  Next appointment: 01/11/2019 at 8:30a   Preventive Care 65 Years and Older, Female Preventive care refers to lifestyle choices and visits with your health care provider that can promote health and wellness. What does preventive care include?  A yearly physical exam. This is also called an annual well check.  Dental exams once or twice a year.  Routine eye exams. Ask your health care provider how often you should have your eyes checked.  Personal lifestyle choices, including:  Daily care of your teeth and gums.  Regular physical activity.  Eating a healthy diet.  Avoiding tobacco and drug use.  Limiting alcohol use.  Practicing safe sex.  Taking low-dose aspirin every day.  Taking vitamin and mineral supplements as recommended by your health care provider. What happens during an annual well check? The services and screenings done by your health care provider during your annual well check will depend on your age, overall health, lifestyle risk  factors, and family history of disease. Counseling  Your health care provider may ask you questions about your:  Alcohol use.  Tobacco use.  Drug use.  Emotional well-being.  Home and relationship well-being.  Sexual activity.  Eating habits.  History of falls.  Memory and ability to understand (cognition).  Work and work Statistician.  Reproductive health. Screening  You may have the following tests or measurements:  Height, weight, and BMI.  Blood pressure.  Lipid and cholesterol levels. These may be checked every 5 years, or more frequently if you are over 4 years old.  Skin check.  Lung cancer screening. You may have this screening every year starting at age 51 if you have a 30-pack-year history of smoking and currently smoke or have quit within the past 15 years.  Fecal occult blood test (FOBT) of the stool. You may have this test every year starting at age 43.  Flexible sigmoidoscopy or colonoscopy. You may have a sigmoidoscopy every 5 years or a colonoscopy every 10 years starting at age 52.  Hepatitis C blood test.  Hepatitis B blood test.  Sexually transmitted disease (STD) testing.  Diabetes screening. This is done by checking your blood sugar (glucose) after you have not eaten for a while (fasting). You may have this done every 1-3 years.  Bone density scan. This is done to screen for osteoporosis. You may have this done starting at age 62.  Mammogram. This may be done every 1-2 years. Talk to your health care provider about how often you should have regular mammograms. Talk with your health care provider about your test results, treatment options, and if  necessary, the need for more tests. Vaccines  Your health care provider may recommend certain vaccines, such as:  Influenza vaccine. This is recommended every year.  Tetanus, diphtheria, and acellular pertussis (Tdap, Td) vaccine. You may need a Td booster every 10 years.  Zoster vaccine. You  may need this after age 77.  Pneumococcal 13-valent conjugate (PCV13) vaccine. One dose is recommended after age 6.  Pneumococcal polysaccharide (PPSV23) vaccine. One dose is recommended after age 58. Talk to your health care provider about which screenings and vaccines you need and how often you need them. This information is not intended to replace advice given to you by your health care provider. Make sure you discuss any questions you have with your health care provider. Document Released: 01/02/2016 Document Revised: 08/25/2016 Document Reviewed: 10/07/2015 Elsevier Interactive Patient Education  2017 [place] Prevention in the Home Falls can cause injuries. They can happen to people of all ages. There are many things you can do to make your home safe and to help prevent falls. What can I do on the outside of my home?  Regularly fix the edges of walkways and driveways and fix any cracks.  Remove anything that might make you trip as you walk through a door, such as a raised step or threshold.  Trim any bushes or trees on the path to your home.  Use bright outdoor lighting.  Clear any walking paths of anything that might make someone trip, such as rocks or tools.  Regularly check to see if handrails are loose or broken. Make sure that both sides of any steps have handrails.  Any raised decks and porches should have guardrails on the edges.  Have any leaves, snow, or ice cleared regularly.  Use sand or salt on walking paths during winter.  Clean up any spills in your garage right away. This includes oil or grease spills. What can I do in the bathroom?  Use night lights.  Install grab bars by the toilet and in the tub and shower. Do not use towel bars as grab bars.  Use non-skid mats or decals in the tub or shower.  If you need to sit down in the shower, use a plastic, non-slip stool.  Keep the floor dry. Clean up any water that spills on the floor as soon as  it happens.  Remove soap buildup in the tub or shower regularly.  Attach bath mats securely with double-sided non-slip rug tape.  Do not have throw rugs and other things on the floor that can make you trip. What can I do in the bedroom?  Use night lights.  Make sure that you have a light by your bed that is easy to reach.  Do not use any sheets or blankets that are too big for your bed. They should not hang down onto the floor.  Have a firm chair that has side arms. You can use this for support while you get dressed.  Do not have throw rugs and other things on the floor that can make you trip. What can I do in the kitchen?  Clean up any spills right away.  Avoid walking on wet floors.  Keep items that you use a lot in easy-to-reach places.  If you need to reach something above you, use a strong step stool that has a grab bar.  Keep electrical cords out of the way.  Do not use floor polish or wax that makes floors slippery. If you must  use wax, use non-skid floor wax.  Do not have throw rugs and other things on the floor that can make you trip. What can I do with my stairs?  Do not leave any items on the stairs.  Make sure that there are handrails on both sides of the stairs and use them. Fix handrails that are broken or loose. Make sure that handrails are as long as the stairways.  Check any carpeting to make sure that it is firmly attached to the stairs. Fix any carpet that is loose or worn.  Avoid having throw rugs at the top or bottom of the stairs. If you do have throw rugs, attach them to the floor with carpet tape.  Make sure that you have a light switch at the top of the stairs and the bottom of the stairs. If you do not have them, ask someone to add them for you. What else can I do to help prevent falls?  Wear shoes that:  Do not have high heels.  Have rubber bottoms.  Are comfortable and fit you well.  Are closed at the toe. Do not wear sandals.  If  you use a stepladder:  Make sure that it is fully opened. Do not climb a closed stepladder.  Make sure that both sides of the stepladder are locked into place.  Ask someone to hold it for you, if possible.  Clearly mark and make sure that you can see:  Any grab bars or handrails.  First and last steps.  Where the edge of each step is.  Use tools that help you move around (mobility aids) if they are needed. These include:  Canes.  Walkers.  Scooters.  Crutches.  Turn on the lights when you go into a dark area. Replace any light bulbs as soon as they burn out.  Set up your furniture so you have a clear path. Avoid moving your furniture around.  If any of your floors are uneven, fix them.  If there are any pets around you, be aware of where they are.  Review your medicines with your doctor. Some medicines can make you feel dizzy. This can increase your chance of falling. Ask your doctor what other things that you can do to help prevent falls. This information is not intended to replace advice given to you by your health care provider. Make sure you discuss any questions you have with your health care provider. Document Released: 10/02/2009 Document Revised: 05/13/2016 Document Reviewed: 01/10/2015 Elsevier Interactive Patient Education  2017 [employer].

## 2018-10-11 NOTE — Progress Notes (Addendum)
Subjective:     Patient ID: Deborah [name], female   DOB: 11/05/1951, 67 y.o.   MRN: [id number]  HPI Pt is here for FU DM Average glucose 115-129, unless she has eaten too much carbs the night before.  No vision changes. Denies trouble with slow healing of wounds. Denies hypoglycemia episodes.  Past Medical History:  Diagnosis Date  . Death of child 64   Due to cord strangulation.  . Diabetes mellitus without complication ([place])   . Hypertension     Aspartame and phenylalanine; Erythromycin; Ibuprofen; Phenylalanine; Prilosec [omeprazole]; Tramadol; and Zantac [ranitidine hcl]  Outpatient Medications Prior to Visit  Medication Sig Dispense Refill  . albuterol (PROVENTIL HFA;VENTOLIN HFA) 108 (90 BASE) MCG/ACT inhaler Inhale 2 puffs into the lungs every 4 (four) hours as needed for wheezing or shortness of breath. (Patient not taking: Reported on 10/11/2018) 1 Inhaler 0  . aliskiren (TEKTURNA) 150 MG tablet Take 150 mg by mouth daily.      Marland Kitchen atorvastatin (LIPITOR) 10 MG tablet Take 1 tablet (10 mg total) by mouth daily. (Patient not taking: Reported on 10/11/2018) 90 tablet 1  . capsaicin (ZOSTRIX) 0.025 % cream Apply topically 2 (two) times daily. (Patient not taking: Reported on 10/11/2018) 60 g 0  . Chlorphen-PE-Acetaminophen 4-10-325 MG TABS 1 tab po q 6 h prn drainage and congestion (Patient not taking: Reported on 10/11/2018) 20 tablet 0  . cyclobenzaprine (FLEXERIL) 10 MG tablet Take 10 mg by mouth 3 (three) times daily as needed for muscle spasms.    Marland Kitchen glimepiride (AMARYL) 2 MG tablet TAKE ONE TABLET (2MG ) BY MOUTH EVERY DAY 90 tablet 1  . hydrALAZINE (APRESOLINE) 50 MG tablet TAKE ONE TABLET (50MG ) BY MOUTH EVERY DAY WITH FOOD 90 tablet 1  . hydrochlorothiazide (HYDRODIURIL) 25 MG tablet TAKE ONE TABLET (25MG ) BY MOUTH EVERY DAY 90 tablet 1  . levothyroxine (SYNTHROID, LEVOTHROID) 100 MCG tablet Take 1 tablet (100 mcg total) by mouth daily before breakfast. 90 tablet 1  .  naproxen (NAPROSYN) 500 MG tablet Take 1 tablet (500 mg total) by mouth 2 (two) times daily. (Patient not taking: Reported on 10/11/2018) 30 tablet 0  . omega-3 acid ethyl esters (LOVAZA) 1 g capsule Take by mouth 3 (three) times a week.    . predniSONE (DELTASONE) 20 MG tablet Take 3 tabs po on first day, 2 tabs second day, 2 tabs third day, 1 tab fourth day, 1 tab 5th day. Take with food. (Patient not taking: Reported on 10/11/2018) 9 tablet 0  . valsartan-hydrochlorothiazide (DIOVAN-HCT) 320-25 MG per tablet Take 1 tablet by mouth daily.       No facility-administered medications prior to visit.      Review of Systems  Constitutional: Negative for appetite change, chills and fever.  Eyes: Negative for visual disturbance.  Respiratory: Negative for chest tightness and shortness of breath.   Cardiovascular: Negative for chest pain, palpitations and leg swelling.  Gastrointestinal: Negative for constipation, diarrhea, nausea and vomiting.  Endocrine: Negative for polydipsia, polyphagia and polyuria.  Genitourinary: Negative for dysuria.       Has mild stress incontinence and wears a pad  Skin: Negative for wound.  Neurological: Negative for numbness.       Objective:   Physical Exam BP: (!) 142/82  Pulse: 86  Temp: 97.6 F (36.4 C)  SpO2: 97%  Weight: 217 lb (98.4 kg)  Height: 5\' 7"  (1.702 m)  PainSc: 0-No pain    Constitutional: She is oriented to person,  place, and time. She appears well-developed and well-nourished. No distress.  HENT:  Head: Normocephalic and atraumatic.  Right Ear: External ear normal.  Left Ear: External ear normal.  Nose: Nose normal.  Eyes: Conjunctivae are normal. Right eye exhibits no discharge. Left eye exhibits no discharge. No scleral icterus.  Neck: Neck supple. No thyromegaly present.  No carotid bruits bilaterally  Cardiovascular: Normal rate and regular rhythm.  No murmur heard. Pulmonary/Chest: Effort normal and breath sounds normal. No  respiratory distress.  Musculoskeletal: Normal range of motion. She exhibits no edema. Has a firm prominence on central R clavicle.  Lymphadenopathy:    She has no cervical adenopathy.  Neurological: She is alert and oriented to person, place, and time.  Skin: Skin is warm and dry. Capillary refill takes less than 2 seconds. No rash noted. She is not diaphoretic. Feet with no wounds or ulcers.  Psychiatric: She has a normal mood and affect. Her behavior is normal. Judgment and thought content normal.  Nursing note reviewed.       Assessment:      1- DM type 2- HGBA1C ordered. Ref to eye MD since it has been a while since she saw one. Will continue same meds. Ref to eye MD made. Was seen for Medicare Wellness by LPN today  2- Vitamin D deficiency- Vit D levels ordered.  3- R clavicle enlargement- sent out for R clavicle xray. We will call her with results.  4- Hep C screen- Hep C antibody ordered.  5- hypothyroidism- thyroid studies ordered. We will call her with results.  6- Immunization due- High dose flu shot given       Plan:     FU in 3 months for DM

## 2018-10-12 ENCOUNTER — Encounter: Payer: Self-pay | Admitting: Internal Medicine

## 2018-10-12 LAB — CMP14 + ANION GAP
ALBUMIN: 4.6 g/dL (ref 3.6–4.8)
ALT: 18 IU/L (ref 0–32)
AST: 16 IU/L (ref 0–40)
Albumin/Globulin Ratio: 1.8 (ref 1.2–2.2)
Alkaline Phosphatase: 98 IU/L (ref 39–117)
Anion Gap: 16 mmol/L (ref 10.0–18.0)
BUN / CREAT RATIO: 18 (ref 12–28)
BUN: 11 mg/dL (ref 8–27)
Bilirubin Total: 0.6 mg/dL (ref 0.0–1.2)
CO2: 27 mmol/L (ref 20–29)
Calcium: 9.5 mg/dL (ref 8.7–10.3)
Chloride: 96 mmol/L (ref 96–106)
Creatinine, Ser: 0.62 mg/dL (ref 0.57–1.00)
GFR calc non Af Amer: 94 mL/min/{1.73_m2} (ref >59)
GFR, EST AFRICAN AMERICAN: 109 mL/min/{1.73_m2} (ref >59)
Globulin, Total: 2.6 g/dL (ref 1.5–4.5)
Glucose: 78 mg/dL (ref 65–99)
Potassium: 4 mmol/L (ref 3.5–5.2)
Sodium: 139 mmol/L (ref 134–144)
TOTAL PROTEIN: 7.2 g/dL (ref 6.0–8.5)

## 2018-10-12 LAB — VITAMIN D 25 HYDROXY (VIT D DEFICIENCY, FRACTURES): Vit D, 25-Hydroxy: 44.2 ng/mL (ref 30.0–100.0)

## 2018-10-12 LAB — TSH: TSH: 1.8 u[IU]/mL (ref 0.450–4.500)

## 2018-10-12 LAB — T4, FREE: FREE T4: 1.48 ng/dL (ref 0.82–1.77)

## 2018-10-12 LAB — HEMOGLOBIN A1C
Est. average glucose Bld gHb Est-mCnc: 126 mg/dL
HEMOGLOBIN A1C: 6 % — AB (ref 4.8–5.6)

## 2018-10-12 LAB — T3, FREE: T3, Free: 2.9 pg/mL (ref 2.0–4.4)

## 2018-10-12 LAB — HEPATITIS C ANTIBODY: Hep C Virus Ab: <0.1 s/co ratio (ref 0.0–0.9)

## 2018-10-25 ENCOUNTER — Encounter: Payer: Self-pay | Admitting: Internal Medicine

## 2018-10-25 DIAGNOSIS — E1165 Type 2 diabetes mellitus with hyperglycemia: Secondary | ICD-10-CM | POA: Insufficient documentation

## 2018-11-08 NOTE — Progress Notes (Addendum)
[facility] Note  11/09/2018     CHIEF COMPLAINT Patient presents for Diabetic Eye Exam   HISTORY OF PRESENT ILLNESS: Deborah [name] is a 67 y.o. female who presents to the clinic today for:   HPI    Diabetic Eye Exam    Vision is stable.  Associated Symptoms Negative for Flashes, Blind Spot, Photophobia, Scalp Tenderness, Fever, Floaters, Pain, Glare, Jaw Claudication, Weight Loss, Distortion, Redness, Shoulder/Hip pain, Fatigue and Trauma.  Diabetes characteristics include Type 2 and taking oral medications.  This started 2 years ago.  Blood sugar level is controlled.  Last Blood Glucose 121.  Last A1C 6.  I, the attending physician,  performed the HPI with the patient and updated documentation appropriately.          Comments    Patient here for diabetic retinal evaluation. Patient has been on meds for 2 years. Blood sugar is well controlled with an A1c of 6.0 on 10-11-2018. Patient feels vision is stable OU, uses glasses mostly for computer and reading. OD itches all the time. Tearing makes itching worse. History of pencil stuck in OD as a kid. Doesn't remember any effect on vision.        Last edited by Bernarda Caffey, MD on 11/09/2018  9:51 AM. (History)    pt states her PCP sent her here for a diabetic eye exam, pt states she is a previous pt of [facility], pt states she has not seen them in about 3 years, pt states last year she received an exam at home from [facility], pt states they do "mini exams" in home, pt states her A1C was much better at this check, pt states she saw Dr. Shirley Muscat a few years ago and he sent her to see Dr. Katy Fitch for glaucoma and said she may be able to get a laser procedure to help with the pressure in her eyes, pt states she went to see Dr. Katy Fitch and he said she might need an MRI bc there may be something else going on other than glaucoma, pt states she checked into how much an MRI would cost and she said it  would be $500 so she refused to get it, pt went back to [facility] and they said if she can't follow their recommendations then why is she being treated, so she did not go back  Referring physician: Shelby Mattocks, PA-C [address] [place], [place] [address]  HISTORICAL INFORMATION:   Selected notes from the MEDICAL RECORD NUMBER Referred by Audery Amel, PA-C for DM exam LEE:  Ocular Hx- PMH-DM (takes glimepiride), HTN    CURRENT MEDICATIONS: No current outpatient medications on file. (Ophthalmic Drugs)   No current facility-administered medications for this visit.  (Ophthalmic Drugs)   Current Outpatient Medications (Other)  Medication Sig  . Chlorphen-PE-Acetaminophen 4-10-325 MG TABS 1 tab po q 6 h prn drainage and congestion  . cholecalciferol (VITAMIN D) 1000 units tablet Take 2,000 Units by mouth daily.  . cyclobenzaprine (FLEXERIL) 10 MG tablet Take 10 mg by mouth 3 (three) times daily as needed for muscle spasms.  Marland Kitchen docusate sodium (COLACE) 100 MG capsule Take 100 mg by mouth daily.  . hydrALAZINE (APRESOLINE) 50 MG tablet TAKE ONE TABLET (50MG ) BY MOUTH EVERY DAY WITH FOOD  . hydrochlorothiazide (HYDRODIURIL) 25 MG tablet TAKE ONE TABLET (25MG ) BY MOUTH EVERY DAY  . levothyroxine (SYNTHROID, LEVOTHROID) 100 MCG tablet Take 1 tablet (100 mcg total)  by mouth daily before breakfast.  . magnesium 30 MG tablet Take 30 mg by mouth once.  . naproxen (NAPROSYN) 500 MG tablet Take 1 tablet (500 mg total) by mouth 2 (two) times daily.  Marland Kitchen omega-3 acid ethyl esters (LOVAZA) 1 g capsule Take by mouth 3 (three) times a week.  . vitamin B-12 (CYANOCOBALAMIN) 100 MCG tablet Take 100 mcg by mouth daily.  Marland Kitchen albuterol (PROVENTIL HFA;VENTOLIN HFA) 108 (90 BASE) MCG/ACT inhaler Inhale 2 puffs into the lungs every 4 (four) hours as needed for wheezing or shortness of breath. (Patient not taking: Reported on 10/11/2018)  . aliskiren (TEKTURNA) 150 MG tablet Take 150 mg by  mouth daily.    Marland Kitchen atorvastatin (LIPITOR) 10 MG tablet Take 1 tablet (10 mg total) by mouth daily. (Patient not taking: Reported on 10/11/2018)  . capsaicin (ZOSTRIX) 0.025 % cream Apply topically 2 (two) times daily. (Patient not taking: Reported on 10/11/2018)  . glimepiride (AMARYL) 2 MG tablet TAKE ONE TABLET (2MG ) BY MOUTH EVERY DAY (Patient taking differently: 2 mg daily. )  . predniSONE (DELTASONE) 20 MG tablet Take 3 tabs po on first day, 2 tabs second day, 2 tabs third day, 1 tab fourth day, 1 tab 5th day. Take with food. (Patient not taking: Reported on 10/11/2018)  . valsartan-hydrochlorothiazide (DIOVAN-HCT) 320-25 MG per tablet Take 1 tablet by mouth daily.     No current facility-administered medications for this visit.  (Other)      REVIEW OF SYSTEMS: ROS    Positive for: HENT, Endocrine, Eyes   Negative for: Constitutional, Gastrointestinal, Neurological, Skin, Genitourinary, Musculoskeletal, Cardiovascular, Respiratory, Psychiatric, Allergic/Imm, Heme/Lymph   Last edited by Roselee Nova D on 11/09/2018  8:58 AM. (History)       ALLERGIES Allergies  Allergen Reactions  . Aspartame And Phenylalanine Other (See Comments)    Causes migraines  . Erythromycin     Ask patient to specify type/severity/reaction. Not indicated on medical history form dated 11/04/10.  . Ibuprofen     Ask patient to specify type/severity/reaction. Not noted on medical history form dated 11/04/10.  Marland Kitchen Phenylalanine Other (See Comments)    Causes migraine  . Prilosec [Omeprazole] Nausea And Vomiting  . Tramadol Nausea And Vomiting  . Zantac [Ranitidine Hcl]     PAST MEDICAL HISTORY Past Medical History:  Diagnosis Date  . Death of child 36   Due to cord strangulation.  . Diabetes mellitus without complication ([place])   . Hypertension    Past Surgical History:  Procedure Laterality Date  . APPENDECTOMY  1974  . CARDIAC CATHETERIZATION     two, dates not provided.  . CHOLECYSTECTOMY      confirm date with patient, was it 2?  Marland Kitchen LAPAROSCOPIC TUBAL LIGATION  1987    FAMILY HISTORY Family History  Problem Relation Age of Onset  . COPD Mother   . Diabetes Mother   . Hypertension Mother   . Heart disease Father   . Hypertension Father   . Glaucoma Father     SOCIAL HISTORY Social History   Tobacco Use  . Smoking status: Never Smoker  . Smokeless tobacco: Never Used  Substance Use Topics  . Alcohol use: Yes    Comment: glass of wine or mixed drink on occasion.  . Drug use: No         OPHTHALMIC EXAM:  Base Eye Exam    Visual Acuity (Snellen - Linear)      Right Left   Dist cc 20/20 -1  20/25 -2   Dist ph cc  NI   Correction:  Glasses       Tonometry (Applanation, 9:17 AM)      Right Left   Pressure 52 59       Tonometry #2 (Applanation, 10:09 AM)      Right Left   Pressure 47 50       Gonioscopy ([place] four mirror)      Right Left   Temporal Grade 2 Grade 2   Nasal Grade 2 Grade 2   Superior Grade 2 Grade 2   Inferior Grade 2 Grade 2  OU: Scattered PAS, but TM visible in all 4 quads, 1+pigment       Pupils      Dark Light Shape React APD   Right 3 2 Round Slow None   Left 3 2 Round Slow None       Visual Fields (Counting fingers)      Left Right    Full Full       Extraocular Movement      Right Left    Full, Ortho Full, Ortho       Neuro/Psych    Oriented x3:  Yes   Mood/Affect:  Normal       Dilation    Both eyes:  1.0% Mydriacyl, 2.5% Phenylephrine @ 9:24 AM        Slit Lamp and Fundus Exam    Slit Lamp Exam      Right Left   Lids/Lashes Dermatochalasis - upper lid Dermatochalasis - upper lid   Conjunctiva/Sclera mild Melanosis, mild Conjunctivochalasis mild Melanosis, mild inferiorConjunctivochalasis   Cornea mild Arcus, 1+ Punctate epithelial erosions mild Arcus, 1+ Punctate epithelial erosions   Anterior Chamber Deep and quiet Deep and quiet   Iris Round and dilated to 55mm, No NVI Round and dilated to  51mm, No NVI   Lens 2+ Nuclear sclerosis, 2-3+ Cortical cataract 2+ Nuclear sclerosis, 2-3+ Cortical cataract   Vitreous Vitreous syneresis, Posterior vitreous detachment Vitreous syneresis       Fundus Exam      Right Left   Disc Cupped, mild, temporal Peripapillary atrophy +cupping, temporal PPA   C/D Ratio 0.8 0.8   Macula Flat, Good foveal reflex, mild Retinal pigment epithelial mottling, no edema Flat, Good foveal reflex, Retinal pigment epithelial mottling, No heme or edema   Vessels mild Copper wiring, Vascular attenuation, mild AV crossing changes Vascular attenuation, mild Tortuousity, AV crossing changes   Periphery Attached, No heme  Attached , No heme         Refraction    Wearing Rx      Sphere Cylinder Axis Add   Right -0.50 +0.50 177 +2.50   Left -0.50 +1.25 175 +2.50   Age:  6 yr   Type:  PAL       Manifest Refraction      Sphere Cylinder Axis Dist VA   Right -0.50 +0.50 177 20/20-2   Left -0.50 +1.25 175 20/25-1          IMAGING AND PROCEDURES  Imaging and Procedures for @TODAY @  OCT, Retina - OU - Both Eyes       Right Eye Quality was good. Central Foveal Thickness: 272. Progression has no prior data. Findings include normal foveal contour, no SRF, no IRF, myopic contour.   Left Eye Quality was good. Central Foveal Thickness: 260. Progression has no prior data. Findings include normal foveal contour, no SRF, no IRF, myopic contour.  Notes *Images captured and stored on drive  Diagnosis / Impression:  NFP, No IRF/SRF OU No DME OU Myopic Contour OU  Clinical management:  See below  Abbreviations: NFP - Normal foveal profile. CME - cystoid macular edema. PED - pigment epithelial detachment. IRF - intraretinal fluid. SRF - subretinal fluid. EZ - ellipsoid zone. ERM - epiretinal membrane. ORA - outer retinal atrophy. ORT - outer retinal tubulation. SRHM - subretinal hyper-reflective material                 ASSESSMENT/PLAN:     ICD-10-CM   1. Diabetes mellitus type 2 without retinopathy ([place]) E11.9   2. Retinal edema H35.81 OCT, Retina - OU - Both Eyes  3. Essential hypertension I10   4. Hypertensive retinopathy of both eyes H35.033   5. Combined forms of age-related cataract of both eyes H25.813   6. 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 Primary open angle glaucoma of both eyes, unspecified glaucoma stage H40.1130     1. Diabetes mellitus, type 2 without retinopathy - The incidence, risk factors for progression, natural history and treatment options for diabetic retinopathy  were discussed with patient.   - The need for close monitoring of blood glucose, blood pressure, and serum lipids, avoiding cigarette or any type of tobacco, and the need for long term follow up was also discussed with patient. - f/u in 1 year, sooner prn  2. No retinal edema on exam or OCT  3,4. Hypertensive retinopathy OU - discussed importance of tight BP control - monitor  5. Mixed cataract OU - The symptoms of cataract, surgical options, and treatments and risks were discussed with patient. - discussed diagnosis and progression - not yet visually significant - monitor for now  6,7. Ocular hypertension, likely POAG - IOP 50s OU - c/d 0.8 OU - Gonio shows TM visible 360 OU, Grade 2 - recommend urgent glaucoma eval w/ Dr. Wyatt Portela -- Dr. Katy Fitch to see pt today following appt here - pt reports previous visit at [facility] ~3 yrs ago following referral from Dr. Shirley Muscat   Ophthalmic Meds Ordered this visit:  No orders of the defined types were placed in this encounter.      Return in about 1 year (around 11/10/2019) for F/U DM exam.  There are no Patient Instructions on file for this visit.   Explained the diagnoses, plan, and follow up with the patient and they expressed understanding.  Patient expressed understanding of the importance of proper follow up care.   This document serves as a record of services  personally performed by Gardiner Sleeper, MD, PhD. It was created on their behalf by Ernest Mallick, OA, an ophthalmic assistant. The creation of this record is the provider's dictation and/or activities during the visit.    Electronically signed by: Ernest Mallick, OA  11.20.19 10:52 AM    Gardiner Sleeper, M.D., Ph.D. Diseases & Surgery of the Retina and Vitreous [facility]  I have reviewed the above documentation for accuracy and completeness, and I agree with the above. Gardiner Sleeper, M.D., Ph.D. 11/09/18 10:52 AM     Abbreviations: M myopia (nearsighted); A astigmatism; H hyperopia (farsighted); P presbyopia; Mrx spectacle prescription;  CTL contact lenses; OD right eye; OS left eye; OU both eyes  XT exotropia; ET esotropia; PEK punctate epithelial keratitis; PEE punctate epithelial erosions; DES dry eye syndrome; MGD meibomian gland dysfunction; ATs artificial tears; PFAT's preservative free artificial tears; [place]  nuclear sclerotic cataract; PSC posterior subcapsular cataract; ERM epi-retinal membrane; PVD posterior vitreous detachment; RD retinal detachment; DM diabetes mellitus; DR diabetic retinopathy; NPDR non-proliferative diabetic retinopathy; PDR proliferative diabetic retinopathy; CSME clinically significant macular edema; DME diabetic macular edema; dbh dot blot hemorrhages; CWS cotton wool spot; POAG primary open angle glaucoma; C/D cup-to-disc ratio; HVF humphrey visual field; GVF goldmann visual field; OCT optical coherence tomography; IOP intraocular pressure; BRVO Branch retinal vein occlusion; CRVO central retinal vein occlusion; CRAO central retinal artery occlusion; BRAO branch retinal artery occlusion; RT retinal tear; SB scleral buckle; PPV pars plana vitrectomy; VH Vitreous hemorrhage; PRP panretinal laser photocoagulation; IVK intravitreal kenalog; VMT vitreomacular traction; MH Macular hole;  NVD neovascularization of the disc; NVE neovascularization  elsewhere; AREDS age related eye disease study; ARMD age related macular degeneration; POAG primary open angle glaucoma; EBMD epithelial/anterior basement membrane dystrophy; ACIOL anterior chamber intraocular lens; IOL intraocular lens; PCIOL posterior chamber intraocular lens; Phaco/IOL phacoemulsification with intraocular lens placement; [place] photorefractive keratectomy; LASIK laser assisted in situ keratomileusis; HTN hypertension; DM diabetes mellitus; COPD chronic obstructive pulmonary disease

## 2018-11-09 ENCOUNTER — Encounter (INDEPENDENT_AMBULATORY_CARE_PROVIDER_SITE_OTHER): Payer: Self-pay | Admitting: Ophthalmology

## 2018-11-09 ENCOUNTER — Ambulatory Visit (INDEPENDENT_AMBULATORY_CARE_PROVIDER_SITE_OTHER): Payer: Medicare Other | Admitting: Ophthalmology

## 2018-11-09 DIAGNOSIS — H35033 Hypertensive retinopathy, bilateral: Secondary | ICD-10-CM

## 2018-11-09 DIAGNOSIS — I10 Essential (primary) hypertension: Secondary | ICD-10-CM

## 2018-11-09 DIAGNOSIS — E119 Type 2 diabetes mellitus without complications: Secondary | ICD-10-CM | POA: Diagnosis not present

## 2018-11-09 DIAGNOSIS — H3581 Retinal edema: Secondary | ICD-10-CM | POA: Diagnosis not present

## 2018-11-09 DIAGNOSIS — H25813 Combined forms of age-related cataract, bilateral: Secondary | ICD-10-CM

## 2018-11-09 DIAGNOSIS — H401130 Primary open-angle glaucoma, bilateral, stage unspecified: Secondary | ICD-10-CM

## 2018-11-09 DIAGNOSIS — H40053 Ocular hypertension, bilateral: Secondary | ICD-10-CM

## 2018-11-09 LAB — HM DIABETES EYE EXAM

## 2018-11-27 DIAGNOSIS — H43813 Vitreous degeneration, bilateral: Secondary | ICD-10-CM | POA: Diagnosis not present

## 2018-11-27 DIAGNOSIS — H2513 Age-related nuclear cataract, bilateral: Secondary | ICD-10-CM | POA: Diagnosis not present

## 2018-11-27 DIAGNOSIS — H53423 Scotoma of blind spot area, bilateral: Secondary | ICD-10-CM | POA: Diagnosis not present

## 2018-11-27 DIAGNOSIS — H40053 Ocular hypertension, bilateral: Secondary | ICD-10-CM | POA: Diagnosis not present

## 2018-11-27 NOTE — Progress Notes (Signed)
Done

## 2019-01-01 ENCOUNTER — Ambulatory Visit (INDEPENDENT_AMBULATORY_CARE_PROVIDER_SITE_OTHER): Payer: Medicare Other | Admitting: Nurse Practitioner

## 2019-01-01 ENCOUNTER — Encounter: Payer: Self-pay | Admitting: Nurse Practitioner

## 2019-01-01 VITALS — BP 142/90 | HR 81 | Temp 98.3°F | Ht 62.4 in | Wt 210.4 lb

## 2019-01-01 DIAGNOSIS — J0100 Acute maxillary sinusitis, unspecified: Secondary | ICD-10-CM | POA: Diagnosis not present

## 2019-01-01 DIAGNOSIS — R059 Cough, unspecified: Secondary | ICD-10-CM

## 2019-01-01 DIAGNOSIS — I10 Essential (primary) hypertension: Secondary | ICD-10-CM

## 2019-01-01 DIAGNOSIS — R05 Cough: Secondary | ICD-10-CM

## 2019-01-01 MED ORDER — TRIAMCINOLONE ACETONIDE 40 MG/ML IJ SUSP
60.0000 mg | Freq: Once | INTRAMUSCULAR | Status: AC
  Administered 2019-01-01: 60 mg via INTRAMUSCULAR

## 2019-01-01 MED ORDER — HYDROCODONE-HOMATROPINE 5-1.5 MG/5ML PO SYRP: 5.0000 mL | ORAL_SOLUTION | Freq: Four times a day (QID) | ORAL | 0 refills | Status: DC | PRN

## 2019-01-01 MED ORDER — CEFTRIAXONE SODIUM 1 G IJ SOLR
1.0000 g | Freq: Once | INTRAMUSCULAR | Status: AC
  Administered 2019-01-01: 1 g via INTRAMUSCULAR

## 2019-01-01 NOTE — Progress Notes (Signed)
Subjective:     Patient ID: Deborah [name] , female    DOB: 1951-03-04 , 68 y.o.   MRN: [id number]   Chief Complaint  Patient presents with  . URI    Patient states she has been feeling sick over the past week and has gotten worse. patient reports ear pain,productive cough,sinus pressure, headache     HPI  URI   This is a chronic problem. The current episode started in the past 7 days. There has been no fever. Associated symptoms include coughing (yellow phlegm), ear pain (unable to hear out of left ear) and sinus pain. Pertinent negatives include no abdominal pain, chest pain, headaches, sneezing or sore throat. Associated symptoms comments: Cough syrup ineffective. She has tried decongestant, acetaminophen and antihistamine for the symptoms. The treatment provided no relief.     Past Medical History:  Diagnosis Date  . Death of child 83   Due to cord strangulation.  . Diabetes mellitus without complication ([place])   . Hypertension      Family History  Problem Relation Age of Onset  . COPD Mother   . Diabetes Mother   . Hypertension Mother   . Heart disease Father   . Hypertension Father   . Glaucoma Father      Current Outpatient Medications:  .  cholecalciferol (VITAMIN D) 1000 units tablet, Take 2,000 Units by mouth daily., Disp: , Rfl:  .  cyclobenzaprine (FLEXERIL) 10 MG tablet, Take 10 mg by mouth 3 (three) times daily as needed for muscle spasms., Disp: , Rfl:  .  docusate sodium (COLACE) 100 MG capsule, Take 100 mg by mouth daily., Disp: , Rfl:  .  glimepiride (AMARYL) 2 MG tablet, TAKE ONE TABLET (2MG ) BY MOUTH EVERY DAY (Patient taking differently: 2 mg daily. ), Disp: 90 tablet, Rfl: 1 .  hydrALAZINE (APRESOLINE) 50 MG tablet, TAKE ONE TABLET (50MG ) BY MOUTH EVERY DAY WITH FOOD, Disp: 90 tablet, Rfl: 1 .  hydrochlorothiazide (HYDRODIURIL) 25 MG tablet, TAKE ONE TABLET (25MG ) BY MOUTH EVERY DAY, Disp: 90 tablet, Rfl: 1 .  levothyroxine (SYNTHROID, LEVOTHROID)  100 MCG tablet, Take 1 tablet (100 mcg total) by mouth daily before breakfast., Disp: 90 tablet, Rfl: 1 .  magnesium 30 MG tablet, Take 30 mg by mouth once., Disp: , Rfl:  .  naproxen (NAPROSYN) 500 MG tablet, Take 1 tablet (500 mg total) by mouth 2 (two) times daily., Disp: 30 tablet, Rfl: 0 .  vitamin B-12 (CYANOCOBALAMIN) 100 MCG tablet, Take 100 mcg by mouth daily., Disp: , Rfl:    Allergies  Allergen Reactions  . Aspartame And Phenylalanine Other (See Comments)    Causes migraines  . Erythromycin     Ask patient to specify type/severity/reaction. Not indicated on medical history form dated 11/04/10.  . Ibuprofen     Ask patient to specify type/severity/reaction. Not noted on medical history form dated 11/04/10.  Marland Kitchen Phenylalanine Other (See Comments)    Causes migraine  . Prilosec [Omeprazole] Nausea And Vomiting  . Tramadol Nausea And Vomiting  . Zantac [Ranitidine Hcl]      Review of Systems  Constitutional: Positive for fatigue.  HENT: Positive for ear pain (unable to hear out of left ear) and sinus pain. Negative for sneezing and sore throat.   Eyes: Negative.   Respiratory: Positive for cough (yellow phlegm).   Cardiovascular: Negative for chest pain, palpitations (after taking pseudoephedrine) and leg swelling.  Gastrointestinal: Negative for abdominal pain.  Neurological: Negative for dizziness and headaches.  Today's Vitals   01/01/19 1527  BP: (!) 142/90  Pulse: 81  Temp: 98.3 F (36.8 C)  TempSrc: Oral  SpO2: 97%  Weight: 210 lb 6.4 oz (95.4 kg)  Height: 5' 2.4" (1.585 m)  PainSc: 8   PainLoc: Ear   Body mass index is 37.99 kg/m.   Objective:  Physical Exam Vitals signs reviewed.  Constitutional:      Appearance: Normal appearance.  HENT:     Head: Normocephalic and atraumatic.     Nose: Congestion present.     Mouth/Throat:     Mouth: Mucous membranes are moist.  Eyes:     Extraocular Movements: Extraocular movements intact.      Conjunctiva/sclera: Conjunctivae normal.     Pupils: Pupils are equal, round, and reactive to light.  Neck:     Musculoskeletal: Normal range of motion and neck supple.  Cardiovascular:     Rate and Rhythm: Normal rate.         Assessment And Plan:    1. Acute non-recurrent maxillary sinusitis  She appears fatigued and does not feel well, tearful  Maxillary sinus pain on palpation  Will treat with kenalog and rocephin for quicker and broader coverage - triamcinolone acetonide (KENALOG-40) injection 60 mg - cefTRIAXone (ROCEPHIN) injection 1 g      Minette Brine, FNP

## 2019-01-02 ENCOUNTER — Other Ambulatory Visit: Payer: Self-pay | Admitting: Nurse Practitioner

## 2019-01-03 ENCOUNTER — Other Ambulatory Visit: Payer: Self-pay | Admitting: Nurse Practitioner

## 2019-01-10 ENCOUNTER — Other Ambulatory Visit: Payer: Self-pay | Admitting: Nurse Practitioner

## 2019-01-11 ENCOUNTER — Ambulatory Visit (INDEPENDENT_AMBULATORY_CARE_PROVIDER_SITE_OTHER): Payer: Medicare Other | Admitting: Nurse Practitioner

## 2019-01-11 ENCOUNTER — Encounter: Payer: Self-pay | Admitting: Nurse Practitioner

## 2019-01-11 VITALS — BP 142/90 | HR 78 | Temp 98.0°F | Ht 63.2 in | Wt 209.8 lb

## 2019-01-11 DIAGNOSIS — R7303 Prediabetes: Secondary | ICD-10-CM

## 2019-01-11 DIAGNOSIS — E039 Hypothyroidism, unspecified: Secondary | ICD-10-CM

## 2019-01-11 DIAGNOSIS — R058 Other specified cough: Secondary | ICD-10-CM | POA: Insufficient documentation

## 2019-01-11 DIAGNOSIS — R5383 Other fatigue: Secondary | ICD-10-CM | POA: Diagnosis not present

## 2019-01-11 DIAGNOSIS — I10 Essential (primary) hypertension: Secondary | ICD-10-CM

## 2019-01-11 DIAGNOSIS — E559 Vitamin D deficiency, unspecified: Secondary | ICD-10-CM | POA: Diagnosis not present

## 2019-01-11 DIAGNOSIS — R05 Cough: Secondary | ICD-10-CM

## 2019-01-11 DIAGNOSIS — R059 Cough, unspecified: Secondary | ICD-10-CM

## 2019-01-11 NOTE — Progress Notes (Signed)
Subjective:     Patient ID: Deborah [name] , female    DOB: 1951/08/30 , 68 y.o.   MRN: [id number]   Chief Complaint  Patient presents with  . Diabetes    HPI  Diabetes  She presents for her follow-up diabetic visit. Diabetes type: prediabetes currently. No MedicAlert identification noted. Her disease course has been stable. Pertinent negatives for hypoglycemia include no confusion, dizziness, headaches or nervousness/anxiousness. Pertinent negatives for diabetes include no blurred vision, no chest pain, no fatigue, no polydipsia, no polyphagia and no polyuria. There are no known risk factors for coronary artery disease. Current diabetic treatment includes oral agent (monotherapy). She is compliant with treatment most of the time. Her weight is stable. She is following a generally unhealthy diet. When asked about meal planning, she reported none. She has not had a previous visit with a dietitian. She participates in exercise intermittently. Her overall blood glucose range is 140-180 mg/dl. An ACE inhibitor/angiotensin II receptor blocker is being taken. She does not see a podiatrist.Eye exam is not current.  Hypertension  This is a chronic problem. The current episode started today. The problem has been gradually worsening since onset. Condition status: slightly elevated today. Pertinent negatives include no blurred vision, chest pain, headaches or palpitations. There are no associated agents to hypertension. There are no known risk factors for coronary artery disease. Past treatments include alpha 1 blockers. The current treatment provides no improvement. There are no compliance problems.  There is no history of angina. There is no history of chronic renal disease.     Past Medical History:  Diagnosis Date  . Death of child 71   Due to cord strangulation.  . Diabetes mellitus without complication ([place])   . Hypertension      Family History  Problem Relation Age of Onset  . COPD  Mother   . Diabetes Mother   . Hypertension Mother   . Heart disease Father   . Hypertension Father   . Glaucoma Father      Current Outpatient Medications:  .  cholecalciferol (VITAMIN D) 1000 units tablet, Take 2,000 Units by mouth daily., Disp: , Rfl:  .  cyclobenzaprine (FLEXERIL) 10 MG tablet, Take 10 mg by mouth 3 (three) times daily as needed for muscle spasms., Disp: , Rfl:  .  docusate sodium (COLACE) 100 MG capsule, Take 100 mg by mouth daily., Disp: , Rfl:  .  hydrALAZINE (APRESOLINE) 50 MG tablet, TAKE ONE TABLET (50MG ) BY MOUTH EVERY DAY WITH FOOD, Disp: 90 tablet, Rfl: 1 .  hydrochlorothiazide (HYDRODIURIL) 25 MG tablet, TAKE 1 TABLET BY MOUTH EVERY DAY, Disp: 90 tablet, Rfl: 1 .  HYDROcodone-homatropine (HYDROMET) 5-1.5 MG/5ML syrup, Take 5 mLs by mouth every 6 (six) hours as needed for cough., Disp: 120 mL, Rfl: 0 .  levothyroxine (SYNTHROID, LEVOTHROID) 100 MCG tablet, Take 1 tablet (100 mcg total) by mouth daily before breakfast., Disp: 90 tablet, Rfl: 1 .  magnesium 30 MG tablet, Take 30 mg by mouth once., Disp: , Rfl:  .  naproxen (NAPROSYN) 500 MG tablet, Take 1 tablet (500 mg total) by mouth 2 (two) times daily., Disp: 30 tablet, Rfl: 0 .  vitamin B-12 (CYANOCOBALAMIN) 100 MCG tablet, Take 100 mcg by mouth daily., Disp: , Rfl:  .  glimepiride (AMARYL) 2 MG tablet, TAKE ONE TABLET (2MG ) BY MOUTH EVERY DAY (Patient taking differently: 2 mg daily. ), Disp: 90 tablet, Rfl: 1   Allergies  Allergen Reactions  . Aspartame And Phenylalanine  Other (See Comments)    Causes migraines  . Erythromycin     Ask patient to specify type/severity/reaction. Not indicated on medical history form dated 11/04/10.  . Ibuprofen     Ask patient to specify type/severity/reaction. Not noted on medical history form dated 11/04/10.  Marland Kitchen Phenylalanine Other (See Comments)    Causes migraine  . Prilosec [Omeprazole] Nausea And Vomiting  . Tramadol Nausea And Vomiting  . Zantac [Ranitidine Hcl]       Review of Systems  Constitutional: Negative for chills and fatigue.  Eyes: Negative for blurred vision and photophobia.  Respiratory: Positive for cough. Negative for wheezing.   Cardiovascular: Negative.  Negative for chest pain, palpitations and leg swelling.  Endocrine: Negative for polydipsia, polyphagia and polyuria.  Neurological: Negative for dizziness and headaches.  Psychiatric/Behavioral: Negative for confusion. The patient is not nervous/anxious.      Today's Vitals   01/11/19 0843  BP: (!) 142/90  Pulse: 78  Temp: 98 F (36.7 C)  TempSrc: Oral  SpO2: 96%  Weight: 209 lb 12.8 oz (95.2 kg)  Height: 5' 3.2" (1.605 m)  PainSc: 0-No pain   Body mass index is 36.93 kg/m.   Objective:  Physical Exam Vitals signs reviewed.  Constitutional:      Appearance: She is well-developed.  HENT:     Head: Normocephalic and atraumatic.  Eyes:     Pupils: Pupils are equal, round, and reactive to light.  Neck:     Musculoskeletal: Normal range of motion and neck supple.  Cardiovascular:     Rate and Rhythm: Normal rate and regular rhythm.     Pulses: Normal pulses.     Heart sounds: Normal heart sounds. No murmur.  Pulmonary:     Effort: Pulmonary effort is normal. No respiratory distress.     Breath sounds: Normal breath sounds.  Chest:     Chest wall: No tenderness.  Musculoskeletal: Normal range of motion.        General: Tenderness: cervical and low back.  Skin:    General: Skin is warm and dry.     Capillary Refill: Capillary refill takes less than 2 seconds.  Neurological:     General: No focal deficit present.     Mental Status: She is alert and oriented to person, place, and time.     Cranial Nerves: No cranial nerve deficit.  Psychiatric:        Mood and Affect: Mood normal.        Behavior: Behavior normal.         Assessment And Plan:     1. Prediabetes  Chronic, controlled  Continue with current medications  Encouraged to limit intake of  sugary foods and drinks  Encouraged to increase physical activity to 150 minutes per week - Lipid panel - CMP14 + Anion Gap - Hemoglobin A1c  2. Hypothyroidism, unspecified type  Chronic, controlled  Continue with current medications - TSH - CBC no Diff  3. Vitamin D deficiency  Will check vitamin D level and supplement as needed.     Also encouraged to spend 15 minutes in the sun daily.   4. Cough  Improving since her last visit however she is not using her Symbicort she feels was not effective however is willing to try again  5. Fatigue, unspecified type  She has not been for her sleep study due to not having someone to care for her husband  I recommended respite to help with this she is going  to think about it.  I will also check vitamin B12, and TSH - Vitamin B12  6. Essential hypertension B/P is slightly elevated today however has been coughing alot CMP ordered to check renal function.  The importance of regular exercise and dietary modification was stressed to the patient.  Stressed importance of losing ten percent of her body weight to help with B/P control.  The weight loss would help with decreasing cardiac and cancer risk as well.     Minette Brine, FNP

## 2019-01-12 LAB — CMP14 + ANION GAP
A/G RATIO: 1.5 (ref 1.2–2.2)
ALT: 18 IU/L (ref 0–32)
AST: 10 IU/L (ref 0–40)
Albumin: 4.3 g/dL (ref 3.8–4.8)
Alkaline Phosphatase: 86 IU/L (ref 39–117)
Anion Gap: 15 mmol/L (ref 10.0–18.0)
BILIRUBIN TOTAL: 0.4 mg/dL (ref 0.0–1.2)
BUN/Creatinine Ratio: 31 — ABNORMAL HIGH (ref 12–28)
BUN: 20 mg/dL (ref 8–27)
CO2: 25 mmol/L (ref 20–29)
Calcium: 9.6 mg/dL (ref 8.7–10.3)
Chloride: 98 mmol/L (ref 96–106)
Creatinine, Ser: 0.64 mg/dL (ref 0.57–1.00)
GFR calc Af Amer: 108 mL/min/{1.73_m2} (ref >59)
GFR calc non Af Amer: 93 mL/min/{1.73_m2} (ref >59)
Globulin, Total: 2.8 g/dL (ref 1.5–4.5)
Glucose: 93 mg/dL (ref 65–99)
POTASSIUM: 4.4 mmol/L (ref 3.5–5.2)
Sodium: 138 mmol/L (ref 134–144)
Total Protein: 7.1 g/dL (ref 6.0–8.5)

## 2019-01-12 LAB — VITAMIN B12: Vitamin B-12: >2000 pg/mL — ABNORMAL HIGH (ref 232–1245)

## 2019-01-12 LAB — CBC
Hematocrit: 35.8 % (ref 34.0–46.6)
Hemoglobin: 12 g/dL (ref 11.1–15.9)
MCH: 28.6 pg (ref 26.6–33.0)
MCHC: 33.5 g/dL (ref 31.5–35.7)
MCV: 85 fL (ref 79–97)
Platelets: 457 10*3/uL — ABNORMAL HIGH (ref 150–450)
RBC: 4.2 x10E6/uL (ref 3.77–5.28)
RDW: 13.8 % (ref 11.7–15.4)
WBC: 5.6 10*3/uL (ref 3.4–10.8)

## 2019-01-12 LAB — LIPID PANEL
Chol/HDL Ratio: 3.7 ratio (ref 0.0–4.4)
Cholesterol, Total: 206 mg/dL — ABNORMAL HIGH (ref 100–199)
HDL: 55 mg/dL (ref >39)
LDL CALC: 138 mg/dL — AB (ref 0–99)
Triglycerides: 63 mg/dL (ref 0–149)
VLDL CHOLESTEROL CAL: 13 mg/dL (ref 5–40)

## 2019-01-12 LAB — HEMOGLOBIN A1C
Est. average glucose Bld gHb Est-mCnc: 123 mg/dL
Hgb A1c MFr Bld: 5.9 % — ABNORMAL HIGH (ref 4.8–5.6)

## 2019-01-12 LAB — TSH: TSH: 1.52 u[IU]/mL (ref 0.450–4.500)

## 2019-01-12 LAB — VITAMIN D 25 HYDROXY (VIT D DEFICIENCY, FRACTURES): Vit D, 25-Hydroxy: 42.5 ng/mL (ref 30.0–100.0)

## 2019-01-16 ENCOUNTER — Encounter: Payer: Self-pay | Admitting: Nurse Practitioner

## 2019-01-18 ENCOUNTER — Other Ambulatory Visit: Payer: Self-pay

## 2019-01-18 MED ORDER — ACURA BLOOD GLUCOSE METER W/DEVICE KIT: PACK | 0 refills | Status: DC

## 2019-01-26 ENCOUNTER — Encounter: Payer: Self-pay | Admitting: Nurse Practitioner

## 2019-03-21 ENCOUNTER — Encounter: Payer: Self-pay | Admitting: Nurse Practitioner

## 2019-03-26 ENCOUNTER — Encounter: Payer: Self-pay | Admitting: Nurse Practitioner

## 2019-03-26 ENCOUNTER — Other Ambulatory Visit: Payer: Self-pay | Admitting: Nurse Practitioner

## 2019-03-26 DIAGNOSIS — E039 Hypothyroidism, unspecified: Secondary | ICD-10-CM

## 2019-03-27 ENCOUNTER — Ambulatory Visit (INDEPENDENT_AMBULATORY_CARE_PROVIDER_SITE_OTHER): Payer: Medicare Other | Admitting: Nurse Practitioner

## 2019-03-27 ENCOUNTER — Other Ambulatory Visit: Payer: Self-pay

## 2019-03-27 ENCOUNTER — Encounter: Payer: Self-pay | Admitting: Nurse Practitioner

## 2019-03-27 DIAGNOSIS — R05 Cough: Secondary | ICD-10-CM | POA: Diagnosis not present

## 2019-03-27 DIAGNOSIS — J302 Other seasonal allergic rhinitis: Secondary | ICD-10-CM | POA: Diagnosis not present

## 2019-03-27 DIAGNOSIS — R059 Cough, unspecified: Secondary | ICD-10-CM

## 2019-03-27 DIAGNOSIS — Z7189 Other specified counseling: Secondary | ICD-10-CM | POA: Diagnosis not present

## 2019-03-27 MED ORDER — HYDROCODONE-HOMATROPINE 5-1.5 MG/5ML PO SYRP: 5.0000 mL | ORAL_SOLUTION | Freq: Four times a day (QID) | ORAL | 0 refills | Status: DC | PRN

## 2019-03-27 NOTE — Progress Notes (Addendum)
This visit type was conducted due to national recommendations for restrictions regarding the COVID-19 Pandemic (e.g. social distancing).  This format is felt to be most appropriate for this patient at this time.  All issues noted in this document were discussed and addressed.  No physical exam was performed (except for noted visual exam findings with Video Visits).  Please refer to the patient's chart (MyChart message for video visits and phone note for telephone visits) for the patient's consent to telehealth for [facility].   Subjective:     Patient ID: Deborah [name] , female    DOB: [birth year] , 68 y.o.   MRN: [id number]  Virtual Visit via Telephone Note  I connected with Deborah [name] at home on 03/27/19 at  3:45 PM EDT by telephone and verified that I am speaking with the correct person using two identifiers.   I discussed the limitations, risks, security and privacy concerns of performing an evaluation and management service by telephone and the availability of in person appointments. I also discussed with the patient that there may be a patient responsible charge related to this service. The patient expressed understanding and agreed to proceed.  Chief Complaint  Patient presents with  . Cough    History of Present Illness:   Worse in the winter and summer.  And when she is going outside.  She is not taking anything else normally with the cough being so bad. She does go to the grocery store twice a week.    Cough  This is a recurrent problem. The current episode started in the past 7 days (Saturday worsening). The problem has been gradually worsening. The problem occurs every few hours. The cough is productive of sputum (clear phlegm). Associated symptoms include headaches. Pertinent negatives include no chest pain, ear congestion, fever, rash, sore throat, shortness of breath or wheezing. Nothing aggravates the symptoms. She has tried nothing for the symptoms. There is no history  of asthma. chronic cough     Past Medical History:  Diagnosis Date  . Death of child 40   Due to cord strangulation.  . Diabetes mellitus without complication ([place])   . Hypertension      Family History  Problem Relation Age of Onset  . COPD Mother   . Diabetes Mother   . Hypertension Mother   . Heart disease Father   . Hypertension Father   . Glaucoma Father      Current Outpatient Medications:  .  Blood Glucose Monitoring Suppl (ACURA BLOOD GLUCOSE METER) w/Device KIT, Check blood sugars twice daily. Please provide patient with meter and supplies that is covered by pt ins., Disp: 1 kit, Rfl: 0 .  cholecalciferol (VITAMIN D) 1000 units tablet, Take 2,000 Units by mouth daily., Disp: , Rfl:  .  cyclobenzaprine (FLEXERIL) 10 MG tablet, Take 10 mg by mouth 3 (three) times daily as needed for muscle spasms., Disp: , Rfl:  .  docusate sodium (COLACE) 100 MG capsule, Take 100 mg by mouth daily., Disp: , Rfl:  .  glimepiride (AMARYL) 2 MG tablet, TAKE ONE TABLET (2MG) BY MOUTH EVERY DAY (Patient taking differently: 2 mg daily. ), Disp: 90 tablet, Rfl: 1 .  hydrALAZINE (APRESOLINE) 50 MG tablet, TAKE ONE TABLET (50MG) BY MOUTH EVERY DAY WITH FOOD, Disp: 90 tablet, Rfl: 1 .  hydrochlorothiazide (HYDRODIURIL) 25 MG tablet, TAKE 1 TABLET BY MOUTH EVERY DAY, Disp: 90 tablet, Rfl: 1 .  HYDROcodone-homatropine (HYDROMET) 5-1.5 MG/5ML syrup, Take 5 mLs by mouth  every 6 (six) hours as needed for cough., Disp: 120 mL, Rfl: 0 .  naproxen (NAPROSYN) 500 MG tablet, Take 1 tablet (500 mg total) by mouth 2 (two) times daily., Disp: 30 tablet, Rfl: 0 .  SYNTHROID 100 MCG tablet, TAKE 1 TABLET BY MOUTH DAILY BEFORE BREAKFAST., Disp: 90 tablet, Rfl: 1 .  magnesium 30 MG tablet, Take 30 mg by mouth once., Disp: , Rfl:  .  vitamin B-12 (CYANOCOBALAMIN) 100 MCG tablet, Take 100 mcg by mouth daily., Disp: , Rfl:    Allergies  Allergen Reactions  . Aspartame And Phenylalanine Other (See Comments)     Causes migraines  . Erythromycin     Ask patient to specify type/severity/reaction. Not indicated on medical history form dated 11/04/10.  . Ibuprofen     Ask patient to specify type/severity/reaction. Not noted on medical history form dated 11/04/10.  Marland Kitchen Phenylalanine Other (See Comments)    Causes migraine  . Prilosec [Omeprazole] Nausea And Vomiting  . Tramadol Nausea And Vomiting  . Zantac [Ranitidine Hcl]      Review of Systems  Constitutional: Negative for fatigue and fever.  HENT: Negative for congestion, sinus pressure and sore throat.   Respiratory: Positive for cough. Negative for choking, shortness of breath and wheezing.   Cardiovascular: Negative for chest pain, palpitations and leg swelling.  Skin: Negative for rash.  Neurological: Positive for headaches. Negative for dizziness.     There were no vitals filed for this visit.  Observations/Objective:        Assessment and Plan: 1. Cough  She has noticed an increase in her cough with the increase in pollen count  She has tried all other cough medications to include symbicort and albuterol inhalers. The only medication that works for her is hydromet, last given in January  - HYDROcodone-homatropine (HYDROMET) 5-1.5 MG/5ML syrup; Take 5 mLs by mouth every 6 (six) hours as needed for cough.  Dispense: 120 mL; Refill: 0  2. Seasonal allergies  Increasing in sneezing and coughing, notices her coughing is worse because of allergies.   Follow Up Instructions:  Return call to office if symptoms   I have stressed with her the importance to wear a mask when out and encouraged to go grocery shopping once per week to decrease her frequency in risk for exposure  I discussed the assessment and treatment plan with the patient. The patient was provided an opportunity to ask questions and all were answered. The patient agreed with the plan and demonstrated an understanding of the instructions.   The patient was advised to  call back or seek an in-person evaluation if the symptoms worsen or if the condition fails to improve as anticipated.  COVID-19 Education: The signs and symptoms of COVID-19 were discussed with the patient and how to seek care for testing (follow up with PCP or arrange E-visit).  The importance of social distancing was discussed today.   Patient Risk:   After full review of this patients clinical status, I feel that they are at least moderate risk at this time.   I provided 12 minutes of non-face-to-face time during this encounter.   Minette Brine, FNP

## 2019-04-09 ENCOUNTER — Encounter: Payer: Self-pay | Admitting: Nurse Practitioner

## 2019-04-11 ENCOUNTER — Other Ambulatory Visit: Payer: Self-pay | Admitting: Nurse Practitioner

## 2019-04-12 ENCOUNTER — Other Ambulatory Visit: Payer: Self-pay

## 2019-04-12 ENCOUNTER — Ambulatory Visit (INDEPENDENT_AMBULATORY_CARE_PROVIDER_SITE_OTHER): Payer: Medicare Other | Admitting: Nurse Practitioner

## 2019-04-12 ENCOUNTER — Encounter: Payer: Self-pay | Admitting: Nurse Practitioner

## 2019-04-12 VITALS — BP 160/100 | HR 85 | Temp 98.3°F | Ht 63.25 in | Wt 208.0 lb

## 2019-04-12 DIAGNOSIS — E039 Hypothyroidism, unspecified: Secondary | ICD-10-CM | POA: Diagnosis not present

## 2019-04-12 DIAGNOSIS — I10 Essential (primary) hypertension: Secondary | ICD-10-CM

## 2019-04-12 DIAGNOSIS — E119 Type 2 diabetes mellitus without complications: Secondary | ICD-10-CM | POA: Diagnosis not present

## 2019-04-12 DIAGNOSIS — E559 Vitamin D deficiency, unspecified: Secondary | ICD-10-CM | POA: Diagnosis not present

## 2019-04-12 MED ORDER — HYDRALAZINE HCL 50 MG PO TABS: 50.0000 mg | ORAL_TABLET | Freq: Two times a day (BID) | ORAL | 1 refills | Status: DC

## 2019-04-12 MED ORDER — HYDROCHLOROTHIAZIDE 50 MG PO TABS: 25.0000 mg | ORAL_TABLET | Freq: Every day | ORAL | 1 refills | Status: DC

## 2019-04-12 NOTE — Progress Notes (Signed)
Subjective:     Patient ID: Deborah [name] , female    DOB: 03-26-1951 , 68 y.o.   MRN: [id number]   Chief Complaint  Patient presents with  . Diabetes  . Hypertension    HPI  She did not tolerate Ozempic well.  No longer taking.    She is planning to go back to work part time (temporary) went to [facility] [employer] and her blood pressure was elevated at 175/116 - was as high as 205/105.  She is taking Hydralazine 80m and 243mHCTZ.  Blood pressure ranging 159-205/91-116.  She reports the first blood pressure she had drank 2 cups of coffee.    Needs a copy of her influenza vaccine on letterhead.    Sleeps poorly and did not go for her sleep study due not having anyone at home with her husband.    Diabetes  She presents for her follow-up diabetic visit. She has type 2 diabetes mellitus. No MedicAlert identification noted. Her disease course has been stable. There are no hypoglycemic associated symptoms. Pertinent negatives for hypoglycemia include no confusion, dizziness, headaches or nervousness/anxiousness. There are no diabetic associated symptoms. Pertinent negatives for diabetes include no chest pain, no fatigue, no polydipsia, no polyphagia and no polyuria. There are no hypoglycemic complications. Symptoms are stable. There are no diabetic complications. Risk factors for coronary artery disease include sedentary lifestyle and obesity. Current diabetic treatment includes oral agent (monotherapy). She is compliant with treatment all of the time. Diabetic current diet: due to Pandemic has been eating more.  When asked about meal planning, she reported none. (99-125) Eye exam is current.  Hypertension  This is a chronic problem. The current episode started more than 1 year ago. The problem has been gradually worsening since onset. The problem is uncontrolled. Pertinent negatives include no anxiety, chest pain, headaches or palpitations. There are no associated agents to hypertension. There  are no known risk factors for coronary artery disease. Past treatments include diuretics. The current treatment provides no improvement. There are no compliance problems.  There is no history of chronic renal disease, sleep apnea or a thyroid problem.     Past Medical History:  Diagnosis Date  . Death of child [age] Due to cord strangulation.  . [place]  . Hypertension      Family History  Problem Relation Age of Onset  . COPD Mother   . Diabetes Mother   . Hypertension Mother   . Heart disease Father   . Hypertension Father   . Glaucoma Father      Current Outpatient Medications:  .  Blood Glucose Monitoring Suppl (ACURA BLOOD GLUCOSE METER) w/Device KIT, Check blood sugars twice daily. Please provide patient with meter and supplies that is covered by pt ins., Disp: 1 kit, Rfl: 0 .  cholecalciferol (VITAMIN D) 1000 units tablet, Take 2,000 Units by mouth daily., Disp: , Rfl:  .  cyclobenzaprine (FLEXERIL) 10 MG tablet, Take 10 mg by mouth 3 (three) times daily as needed for muscle spasms., Disp: , Rfl:  .  docusate sodium (COLACE) 100 MG capsule, Take 100 mg by mouth daily., Disp: , Rfl:  .  glimepiride (AMARYL) 2 MG tablet, TAKE ONE TABLET (2MG) BY MOUTH EVERY DAY (Patient taking differently: 2 mg daily. ), Disp: 90 tablet, Rfl: 1 .  hydrALAZINE (APRESOLINE) 50 MG tablet, TAKE ONE TABLET (50MG) BY MOUTH EVERY DAY WITH FOOD, Disp: 90 tablet, Rfl: 1 .  hydrochlorothiazide (  HYDRODIURIL) 25 MG tablet, TAKE 1 TABLET BY MOUTH EVERY DAY, Disp: 90 tablet, Rfl: 1 .  HYDROcodone-homatropine (HYDROMET) 5-1.5 MG/5ML syrup, Take 5 mLs by mouth every 6 (six) hours as needed for cough., Disp: 120 mL, Rfl: 0 .  Magnesium 250 MG TABS, Take 1 tablet by mouth daily., Disp: , Rfl:  .  SYNTHROID 100 MCG tablet, TAKE 1 TABLET BY MOUTH DAILY BEFORE BREAKFAST., Disp: 90 tablet, Rfl: 1   Allergies  Allergen Reactions  . Aspartame And Phenylalanine Other (See  Comments)    Causes migraines  . Erythromycin     Ask patient to specify type/severity/reaction. Not indicated on medical history form dated 11/04/10.  . Ibuprofen     Ask patient to specify type/severity/reaction. Not noted on medical history form dated 11/04/10.  Marland Kitchen Phenylalanine Other (See Comments)    Causes migraine  . Prilosec [Omeprazole] Nausea And Vomiting  . Tramadol Nausea And Vomiting  . Zantac [Ranitidine Hcl]      Review of Systems  Constitutional: Negative for chills and fatigue.  Eyes: Negative for photophobia.  Respiratory: Negative for cough and wheezing.   Cardiovascular: Negative.  Negative for chest pain, palpitations and leg swelling.  Endocrine: Negative for polydipsia, polyphagia and polyuria.  Musculoskeletal: Negative for arthralgias.  Neurological: Negative for dizziness and headaches.  Psychiatric/Behavioral: Negative for confusion. The patient is not nervous/anxious.      Today's Vitals   04/12/19 0837  BP: (!) 160/100  Pulse: 85  Temp: 98.3 F (36.8 C)  TempSrc: Oral  Weight: 208 lb (94.3 kg)  Height: 5' 3.25" (1.607 m)  PainSc: 0-No pain   Body mass index is 36.55 kg/m.   Objective:  Physical Exam Vitals signs reviewed.  Constitutional:      Appearance: Normal appearance.  HENT:     Nose: No congestion.     Mouth/Throat:     Mouth: Mucous membranes are moist.  Neck:     Musculoskeletal: Normal range of motion and neck supple.  Cardiovascular:     Rate and Rhythm: Normal rate and regular rhythm.     Pulses: Normal pulses.     Heart sounds: Normal heart sounds. No murmur.  Pulmonary:     Effort: Pulmonary effort is normal.     Breath sounds: Normal breath sounds.  Skin:    General: Skin is warm and dry.     Capillary Refill: Capillary refill takes less than 2 seconds.  Neurological:     General: No focal deficit present.  Psychiatric:        Mood and Affect: Mood normal.        Behavior: Behavior normal.        Thought  Content: Thought content normal.        Judgment: Judgment normal.         Assessment And Plan:     1. Essential hypertension  Elevated blood pressure today   Will increase her hydralazine and HCTZ  She is following up at [employer] for employment and given an action plan  She will return to office in 2 weeks for follow up - BMP8+eGFR - hydrALAZINE (APRESOLINE) 50 MG tablet; Take 1 tablet (50 mg total) by mouth 2 (two) times a day.  Dispense: 180 tablet; Refill: 1 - hydrochlorothiazide (HYDRODIURIL) 50 MG tablet; Take 0.5 tablets (25 mg total) by mouth daily.  Dispense: 90 tablet; Refill: 1  2. Type 2 diabetes mellitus without complication, without long-term current use of insulin ([facility])  Chronic,  stable,  She did not tolerate the Ozempic  Advised to continue with the metformin  Encouraged to limit intake of sugary foods and drinks  Encouraged to increase physical activity to 150 minutes per week  - Hemoglobin A1c  3. Hypothyroidism, unspecified type  Chronic, stable  Continue with current medications - TSH - T3 - T4, Free  4. Vitamin D deficiency  Will check vitamin D level and supplement as needed.     Also encouraged to spend 15 minutes in the sun daily.   Will check vitamin d level    Minette Brine, FNP    THE PATIENT IS ENCOURAGED TO PRACTICE SOCIAL DISTANCING DUE TO THE COVID-19 PANDEMIC.

## 2019-04-12 NOTE — Patient Instructions (Signed)

## 2019-04-15 LAB — HEMOGLOBIN A1C
Est. average glucose Bld gHb Est-mCnc: 123 mg/dL
Hgb A1c MFr Bld: 5.9 % — ABNORMAL HIGH (ref 4.8–5.6)

## 2019-04-15 LAB — TSH: TSH: 2.52 u[IU]/mL (ref 0.450–4.500)

## 2019-04-15 LAB — BMP8+EGFR
BUN/Creatinine Ratio: 24 (ref 12–28)
BUN: 15 mg/dL (ref 8–27)
CO2: 26 mmol/L (ref 20–29)
Calcium: 9.5 mg/dL (ref 8.7–10.3)
Chloride: 99 mmol/L (ref 96–106)
Creatinine, Ser: 0.63 mg/dL (ref 0.57–1.00)
GFR calc Af Amer: 108 mL/min/{1.73_m2} (ref >59)
GFR calc non Af Amer: 94 mL/min/{1.73_m2} (ref >59)
Glucose: 86 mg/dL (ref 65–99)
Potassium: 4.3 mmol/L (ref 3.5–5.2)
Sodium: 139 mmol/L (ref 134–144)

## 2019-04-15 LAB — VITAMIN D 1,25 DIHYDROXY
Vitamin D 1, 25 (OH)2 Total: 55 pg/mL
Vitamin D2 1, 25 (OH)2: 12 pg/mL
Vitamin D3 1, 25 (OH)2: 43 pg/mL

## 2019-04-15 LAB — T3: T3, Total: 111 ng/dL (ref 71–180)

## 2019-04-15 LAB — T4, FREE: Free T4: 1.5 ng/dL (ref 0.82–1.77)

## 2019-04-16 ENCOUNTER — Encounter: Payer: Self-pay | Admitting: Nurse Practitioner

## 2019-04-16 ENCOUNTER — Other Ambulatory Visit: Payer: Self-pay | Admitting: Nurse Practitioner

## 2019-04-30 ENCOUNTER — Encounter: Payer: Self-pay | Admitting: Nurse Practitioner

## 2019-04-30 ENCOUNTER — Other Ambulatory Visit: Payer: Self-pay

## 2019-04-30 ENCOUNTER — Ambulatory Visit (INDEPENDENT_AMBULATORY_CARE_PROVIDER_SITE_OTHER): Payer: Medicare Other | Admitting: Nurse Practitioner

## 2019-04-30 VITALS — BP 160/94 | HR 75 | Temp 98.6°F | Ht 62.8 in | Wt 212.8 lb

## 2019-04-30 DIAGNOSIS — I10 Essential (primary) hypertension: Secondary | ICD-10-CM

## 2019-04-30 DIAGNOSIS — G4700 Insomnia, unspecified: Secondary | ICD-10-CM

## 2019-04-30 MED ORDER — HYDROCHLOROTHIAZIDE 50 MG PO TABS: ORAL_TABLET | ORAL | 1 refills | Status: DC

## 2019-04-30 MED ORDER — TRAZODONE HCL 50 MG PO TABS: ORAL_TABLET | ORAL | 2 refills | Status: DC

## 2019-04-30 NOTE — Progress Notes (Signed)
Subjective:     Patient ID: Deborah [name] , female    DOB: 11/04/1951 , 68 y.o.   MRN: [id number]   Chief Complaint  Patient presents with  . Hypertension    HPI  Blood pressure is ranging 148-187/80-100.  She was going to an exercise class 4 times a week.  She was seen at [facility] on April 27th.  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  She has taken lopressor and metoprolol not effective.  She also took amlodipine which she says has caused her more coughing.    Hypertension  This is a chronic problem. The current episode started more than 1 year ago. The problem is uncontrolled. Pertinent negatives include no anxiety, blurred vision, chest pain, headaches or palpitations. There are no associated agents to hypertension. Risk factors for coronary artery disease include obesity (she is walking about 1-2 times a week.  ). Past treatments include diuretics and angiotensin blockers. There are no compliance problems.  There is no history of angina or kidney disease. There is no history of chronic renal disease.     Past Medical History:  Diagnosis Date  . 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  . Diabetes mellitus without complication ([place])   . Hypertension      Family History  Problem Relation Age of Onset  . COPD Mother   . Diabetes Mother   . Hypertension Mother   . Heart disease Father   . Hypertension Father   . Glaucoma Father      Current Outpatient Medications:  .  Blood Glucose Monitoring Suppl (ACURA BLOOD GLUCOSE METER) w/Device KIT, Check blood sugars twice daily. Please provide patient with meter and supplies that is covered by pt ins., Disp: 1 kit, Rfl: 0 .  cholecalciferol (VITAMIN D) 1000 units tablet, Take 2,000 Units by mouth daily., Disp: , Rfl:  .  cyclobenzaprine (FLEXERIL) 10 MG tablet, Take 10 mg by mouth 3 (three) times daily as needed for muscle spasms., Disp: , Rfl:  .  docusate sodium (COLACE) 100 MG capsule, Take 100  mg by mouth daily., Disp: , Rfl:  .  glimepiride (AMARYL) 2 MG tablet, TAKE ONE TABLET ('2MG'$ ) BY MOUTH EVERY DAY, Disp: 90 tablet, Rfl: 1 .  hydrALAZINE (APRESOLINE) 50 MG tablet, Take 1 tablet (50 mg total) by mouth 2 (two) times a day., Disp: 180 tablet, Rfl: 1 .  hydrochlorothiazide (HYDRODIURIL) 50 MG tablet, Take 0.5 tablets (25 mg total) by mouth daily., Disp: 90 tablet, Rfl: 1 .  HYDROcodone-homatropine (HYDROMET) 5-1.5 MG/5ML syrup, Take 5 mLs by mouth every 6 (six) hours as needed for cough., Disp: 120 mL, Rfl: 0 .  Magnesium 250 MG TABS, Take 1 tablet by mouth daily., Disp: , Rfl:  .  SYNTHROID 100 MCG tablet, TAKE 1 TABLET BY MOUTH DAILY BEFORE BREAKFAST., Disp: 90 tablet, Rfl: 1   Allergies  Allergen Reactions  . Aspartame And Phenylalanine Other (See Comments)    Causes migraines  . Erythromycin     Ask patient to specify type/severity/reaction. Not indicated on medical history form dated 11/04/10.  . Ibuprofen     Ask patient to specify type/severity/reaction. Not noted on medical history form dated 11/04/10.  Marland Kitchen Phenylalanine Other (See Comments)    Causes migraine  . Prilosec [Omeprazole] Nausea And Vomiting  . Tramadol Nausea And Vomiting  . Zantac [Ranitidine Hcl]      Review of Systems  Constitutional: Negative.  Eyes: Negative for blurred vision.  Respiratory: Negative.   Cardiovascular: Negative.  Negative for chest pain, palpitations and leg swelling.  Neurological: Negative for dizziness and headaches.     Today's Vitals   04/30/19 0843  BP: (!) 160/94  Pulse: 75  Temp: 98.6 F (37 C)  TempSrc: Oral  Weight: 212 lb 12.8 oz (96.5 kg)  Height: 5' 2.8" (1.595 m)  PainSc: 0-No pain   Body mass index is 37.94 kg/m.   Objective:  Physical Exam Vitals signs reviewed.  Constitutional:      Appearance: Normal appearance. She is obese. She is not ill-appearing.  Cardiovascular:     Rate and Rhythm: Normal rate and regular rhythm.     Pulses: Normal  pulses.     Heart sounds: Normal heart sounds. No murmur.  Pulmonary:     Effort: Pulmonary effort is normal. No respiratory distress.     Breath sounds: Normal breath sounds.  Skin:    General: Skin is warm and dry.     Capillary Refill: Capillary refill takes less than 2 seconds.  Neurological:     General: No focal deficit present.     Mental Status: She is alert and oriented to person, place, and time.  Psychiatric:        Mood and Affect: Mood normal.        Behavior: Behavior normal.        Thought Content: Thought content normal.        Judgment: Judgment normal.         Assessment And Plan:     1. Essential hypertension . B/P is improving since her last visit.   . Encouraged to continue taking her blood pressure and to send me the readings next Monday to see if we need to make any additional changes.  . She has tried several antihypertensive medications in the past unsure of all the names. She is really sensitive to medications so I am cautious with adding another drug at this time since the blood pressure is improving.  . The importance of regular exercise and dietary modification was stressed to the patient.  - hydrochlorothiazide (HYDRODIURIL) 50 MG tablet; Take 1 tablet by mouth daily  Dispense: 90 tablet; Refill: 1  2. Insomnia, unspecified type  I have discussed with her to try ear plugs and continue wearing eye covers to help with distractions  We will try a low dose trazodone as needed this may in turn improve her blood pressure since she does not get good sleep at night.   Minette Brine, FNP    THE PATIENT IS ENCOURAGED TO PRACTICE SOCIAL DISTANCING DUE TO THE COVID-19 PANDEMIC.

## 2019-05-07 ENCOUNTER — Encounter: Payer: Self-pay | Admitting: Nurse Practitioner

## 2019-05-08 ENCOUNTER — Other Ambulatory Visit: Payer: Self-pay | Admitting: Nurse Practitioner

## 2019-05-15 ENCOUNTER — Emergency Department (HOSPITAL_COMMUNITY)
Admission: EM | Admit: 2019-05-15 | Discharge: 2019-05-15 | Disposition: A | Discharge status: Home / Self Care | Payer: Medicare Other | Attending: Emergency Medicine | Admitting: Emergency Medicine

## 2019-05-15 ENCOUNTER — Other Ambulatory Visit: Payer: Self-pay

## 2019-05-15 ENCOUNTER — Emergency Department (HOSPITAL_COMMUNITY): Payer: Medicare Other

## 2019-05-15 ENCOUNTER — Encounter (HOSPITAL_COMMUNITY): Payer: Self-pay

## 2019-05-15 DIAGNOSIS — E876 Hypokalemia: Secondary | ICD-10-CM | POA: Diagnosis not present

## 2019-05-15 DIAGNOSIS — E039 Hypothyroidism, unspecified: Secondary | ICD-10-CM | POA: Insufficient documentation

## 2019-05-15 DIAGNOSIS — I10 Essential (primary) hypertension: Secondary | ICD-10-CM | POA: Insufficient documentation

## 2019-05-15 DIAGNOSIS — R112 Nausea with vomiting, unspecified: Secondary | ICD-10-CM | POA: Diagnosis not present

## 2019-05-15 DIAGNOSIS — E119 Type 2 diabetes mellitus without complications: Secondary | ICD-10-CM | POA: Diagnosis not present

## 2019-05-15 DIAGNOSIS — R748 Abnormal levels of other serum enzymes: Secondary | ICD-10-CM | POA: Diagnosis not present

## 2019-05-15 DIAGNOSIS — R42 Dizziness and giddiness: Secondary | ICD-10-CM | POA: Diagnosis not present

## 2019-05-15 DIAGNOSIS — Z79899 Other long term (current) drug therapy: Secondary | ICD-10-CM | POA: Diagnosis not present

## 2019-05-15 DIAGNOSIS — R110 Nausea: Secondary | ICD-10-CM | POA: Diagnosis not present

## 2019-05-15 DIAGNOSIS — R109 Unspecified abdominal pain: Secondary | ICD-10-CM

## 2019-05-15 DIAGNOSIS — R1030 Lower abdominal pain, unspecified: Secondary | ICD-10-CM | POA: Diagnosis not present

## 2019-05-15 DIAGNOSIS — R197 Diarrhea, unspecified: Secondary | ICD-10-CM | POA: Diagnosis not present

## 2019-05-15 DIAGNOSIS — R1110 Vomiting, unspecified: Secondary | ICD-10-CM | POA: Diagnosis not present

## 2019-05-15 DIAGNOSIS — R52 Pain, unspecified: Secondary | ICD-10-CM | POA: Diagnosis not present

## 2019-05-15 LAB — COMPREHENSIVE METABOLIC PANEL
ALT: 23 U/L (ref 0–44)
AST: 21 U/L (ref 15–41)
Albumin: 4.2 g/dL (ref 3.5–5.0)
Alkaline Phosphatase: 89 U/L (ref 38–126)
Anion gap: 12 (ref 5–15)
BUN: 25 mg/dL — ABNORMAL HIGH (ref 8–23)
CO2: 24 mmol/L (ref 22–32)
Calcium: 8.6 mg/dL — ABNORMAL LOW (ref 8.9–10.3)
Chloride: 100 mmol/L (ref 98–111)
Creatinine, Ser: 0.68 mg/dL (ref 0.44–1.00)
GFR calc Af Amer: >60 mL/min (ref >60)
GFR calc non Af Amer: >60 mL/min (ref >60)
Glucose, Bld: 151 mg/dL — ABNORMAL HIGH (ref 70–99)
Potassium: 2.9 mmol/L — ABNORMAL LOW (ref 3.5–5.1)
Sodium: 136 mmol/L (ref 135–145)
Total Bilirubin: 0.5 mg/dL (ref 0.3–1.2)
Total Protein: 7.9 g/dL (ref 6.5–8.1)

## 2019-05-15 LAB — CBC WITH DIFFERENTIAL/PLATELET
Abs Immature Granulocytes: 0.06 10*3/uL (ref 0.00–0.07)
Basophils Absolute: 0 10*3/uL (ref 0.0–0.1)
Basophils Relative: 0 %
Eosinophils Absolute: 0 10*3/uL (ref 0.0–0.5)
Eosinophils Relative: 0 %
HCT: 40.4 % (ref 36.0–46.0)
Hemoglobin: 12.6 g/dL (ref 12.0–15.0)
Immature Granulocytes: 0 %
Lymphocytes Relative: 3 %
Lymphs Abs: 0.5 10*3/uL — ABNORMAL LOW (ref 0.7–4.0)
MCH: 28.6 pg (ref 26.0–34.0)
MCHC: 31.2 g/dL (ref 30.0–36.0)
MCV: 91.6 fL (ref 80.0–100.0)
Monocytes Absolute: 1 10*3/uL (ref 0.1–1.0)
Monocytes Relative: 6 %
Neutro Abs: 14.2 10*3/uL — ABNORMAL HIGH (ref 1.7–7.7)
Neutrophils Relative %: 91 %
Platelets: 398 10*3/uL (ref 150–400)
RBC: 4.41 MIL/uL (ref 3.87–5.11)
RDW: 14.7 % (ref 11.5–15.5)
WBC: 15.8 10*3/uL — ABNORMAL HIGH (ref 4.0–10.5)
nRBC: 0 % (ref 0.0–0.2)

## 2019-05-15 LAB — LIPASE, BLOOD: Lipase: 178 U/L — ABNORMAL HIGH (ref 11–51)

## 2019-05-15 MED ORDER — ONDANSETRON HCL 4 MG PO TABS: 4.0000 mg | ORAL_TABLET | Freq: Three times a day (TID) | ORAL | 0 refills | Status: DC | PRN

## 2019-05-15 MED ORDER — SODIUM CHLORIDE 0.9 % IV SOLN
1000.0000 mL | Freq: Once | INTRAVENOUS | Status: AC
  Administered 2019-05-15: 1000 mL via INTRAVENOUS

## 2019-05-15 MED ORDER — IOHEXOL 300 MG/ML  SOLN
100.0000 mL | Freq: Once | INTRAMUSCULAR | Status: AC | PRN
  Administered 2019-05-15: 100 mL via INTRAVENOUS

## 2019-05-15 MED ORDER — DICYCLOMINE HCL 10 MG/ML IM SOLN
10.0000 mg | Freq: Once | INTRAMUSCULAR | Status: AC
  Administered 2019-05-15: 10 mg via INTRAMUSCULAR
  Filled 2019-05-15: qty 2

## 2019-05-15 MED ORDER — DIPHENOXYLATE-ATROPINE 2.5-0.025 MG PO TABS: 1.0000 | ORAL_TABLET | Freq: Four times a day (QID) | ORAL | 0 refills | Status: DC | PRN

## 2019-05-15 MED ORDER — HYDROMORPHONE HCL 1 MG/ML IJ SOLN
0.5000 mg | Freq: Once | INTRAMUSCULAR | Status: DC
  Filled 2019-05-15: qty 1

## 2019-05-15 MED ORDER — DICYCLOMINE HCL 20 MG PO TABS: 20.0000 mg | ORAL_TABLET | Freq: Two times a day (BID) | ORAL | 0 refills | Status: DC

## 2019-05-15 MED ORDER — POTASSIUM CHLORIDE CRYS ER 20 MEQ PO TBCR: 20.0000 meq | EXTENDED_RELEASE_TABLET | Freq: Every day | ORAL | 0 refills | Status: DC

## 2019-05-15 MED ORDER — SODIUM CHLORIDE (PF) 0.9 % IJ SOLN
INTRAMUSCULAR | Status: AC
  Filled 2019-05-15: qty 50

## 2019-05-15 MED ORDER — ONDANSETRON HCL 4 MG/2ML IJ SOLN
4.0000 mg | Freq: Once | INTRAMUSCULAR | Status: AC
  Administered 2019-05-15: 21:00:00 4 mg via INTRAVENOUS
  Filled 2019-05-15: qty 2

## 2019-05-15 NOTE — ED Triage Notes (Signed)
Pt states that she ate [place] food and then started to have abdominal pain with vomiting and now diarrhea in triage

## 2019-05-15 NOTE — Discharge Instructions (Addendum)
Get help right away if: You have chest pain. You feel extremely weak or you faint. You have bloody or black stools or stools that look like tar. You have severe pain, cramping, or bloating in your abdomen. You have trouble breathing or you are breathing very quickly. Your heart is beating very quickly. Your skin feels cold and clammy. You feel confused. You have signs of dehydration, such as: Dark urine, very little urine, or no urine. Cracked lips. Dry mouth. Sunken eyes. Sleepiness. Weakness. 

## 2019-05-15 NOTE — ED Notes (Signed)
Patient transported to CT 

## 2019-05-15 NOTE — ED Notes (Signed)
ED Provider at bedside. 

## 2019-05-15 NOTE — ED Triage Notes (Addendum)
Per EMS- patient c/o bilateral lower abdominal pain and emesis x 1 hour. Patient given Zofran 4 mg IV prior to arrival to the ED

## 2019-05-15 NOTE — ED Provider Notes (Signed)
[facility] DEPT Provider Note   CSN: [id number] Arrival date & time: 05/15/19  [place]    History   Chief Complaint Chief Complaint  Patient presents with  . Abdominal Pain  . Emesis    HPI Deborah [name] is a 68 y.o. female who presents emergency department with chief complaint of abdominal cramping and diarrhea.  Patient states that around 5:30 PM she was up eating dinner when she suddenly became diaphoretic, weak and had severe lower abdominal cramping followed by brown watery stool.  Patient thinks that this is related to the [place] food she ate yesterday.  She denies vomiting but has had some associated nausea.  She has had several episodes of watery stool.  She denies fevers or chills.  She has a history of previous appendectomy and cholecystectomy.  She denies urinary symptoms, vaginal symptoms.  He denies any recent antibiotic use and denies history of C. difficile colitis.  No recent foreign travel     HPI  Past Medical History:  Diagnosis Date  . Death of child 1   Due to cord strangulation.  . Diabetes mellitus without complication ([place])   . Hypertension     Patient Active Problem List   Diagnosis Date Noted  . Insomnia 04/30/2019  . Seasonal allergies 03/27/2019  . Prediabetes 01/11/2019  . Hypothyroidism 01/11/2019  . Vitamin D deficiency 01/11/2019  . Cough 01/11/2019  . Fatigue 01/11/2019  . Uncontrolled type 2 diabetes mellitus with hyperglycemia ([place]) 10/25/2018  . Type 2 diabetes mellitus ([place]) 09/16/2018  . High blood pressure 06/08/2011  . SOB (shortness of breath) 06/08/2011  . Generalized headaches 06/08/2011  . Weight loss 06/08/2011  . Menopause 06/08/2011    Past Surgical History:  Procedure Laterality Date  . APPENDECTOMY  1974  . CARDIAC CATHETERIZATION     two, dates not provided.  . CHOLECYSTECTOMY     confirm date with patient, was it 29?  Marland Kitchen LAPAROSCOPIC TUBAL LIGATION  1987     OB History    No obstetric history on file.      Home Medications    Prior to Admission medications   Medication Sig Start Date End Date Taking? Authorizing Provider  cholecalciferol (VITAMIN D) 1000 units tablet Take 2,000 Units by mouth daily.   Yes [provider]  cyclobenzaprine (FLEXERIL) 10 MG tablet Take 10 mg by mouth 3 (three) times daily as needed for muscle spasms.   Yes [provider]  docusate sodium (COLACE) 100 MG capsule Take 100 mg by mouth daily.   Yes [provider]  dorzolamide-timolol (COSOPT) 22.3-6.8 MG/ML ophthalmic solution Place 1 drop into both eyes 2 (two) times daily. 12/30/18  Yes [provider]  glimepiride (AMARYL) 2 MG tablet TAKE ONE TABLET (2MG) BY MOUTH EVERY DAY Patient taking differently: Take 2 mg by mouth daily.  04/17/19 05/17/19 Yes Minette Brine, FNP  hydrALAZINE (APRESOLINE) 50 MG tablet Take 1 tablet (50 mg total) by mouth 2 (two) times a day. 04/12/19  Yes Minette Brine, FNP  hydrochlorothiazide (HYDRODIURIL) 50 MG tablet Take 1 tablet by mouth daily 04/30/19  Yes Minette Brine, FNP  HYDROcodone-homatropine (HYDROMET) 5-1.5 MG/5ML syrup Take 5 mLs by mouth every 6 (six) hours as needed for cough. 03/27/19  Yes Minette Brine, FNP  Magnesium 250 MG TABS Take 1 tablet by mouth daily.   Yes [provider]  SYNTHROID 100 MCG tablet TAKE 1 TABLET BY MOUTH DAILY BEFORE BREAKFAST. Patient taking differently: Take 100 mcg  by mouth daily before breakfast.  03/26/19  Yes Minette Brine, FNP  traZODone (DESYREL) 50 MG tablet Take 1/2 tablet by mouth at bedtime 04/30/19  Yes Minette Brine, FNP  Blood Glucose Monitoring Suppl [facility] BLOOD GLUCOSE METER) w/Device KIT Check blood sugars twice daily. Please provide patient with meter and supplies that is covered by pt ins. 01/18/19   Minette Brine, FNP  dicyclomine (BENTYL) 20 MG tablet Take 1 tablet (20 mg total) by mouth 2 (two) times daily. 05/15/19   Margarita Mail, PA-C   diphenoxylate-atropine (LOMOTIL) 2.5-0.025 MG tablet Take 1 tablet by mouth 4 (four) times daily as needed for diarrhea or loose stools. 05/15/19   Desaray Marschner, Vernie Shanks, PA-C  ondansetron (ZOFRAN) 4 MG tablet Take 1 tablet (4 mg total) by mouth every 8 (eight) hours as needed for nausea or vomiting. 05/15/19   Margarita Mail, PA-C  ONE TOUCH ULTRA TEST test strip USE WITH METER TO CHECK  BLOOD SUGAR TWO TIMES DAILY , BEFORE BREAKFAST AND  BEFORE DINNER 05/09/19   Minette Brine, FNP  potassium chloride SA (K-DUR) 20 MEQ tablet Take 1 tablet (20 mEq total) by mouth daily. 05/15/19   Margarita Mail, PA-C    Family History Family History  Problem Relation Age of Onset  . COPD Mother   . Diabetes Mother   . Hypertension Mother   . Heart disease Father   . Hypertension Father   . Glaucoma Father     Social History Social History   Tobacco Use  . Smoking status: Never Smoker  . Smokeless tobacco: Never Used  Substance Use Topics  . Alcohol use: Yes    Comment: glass of wine or mixed drink on occasion.  . Drug use: No     Allergies   Aspartame and phenylalanine; Erythromycin; Ibuprofen; Phenylalanine; Prilosec [omeprazole]; Tramadol; and Zantac [ranitidine hcl]   Review of Systems Review of Systems  Ten systems reviewed and are negative for acute change, except as noted in the HPI.   Physical Exam Updated Vital Signs BP (!) 148/86 (BP Location: Left Arm)   Pulse 100   Temp 97.8 F (36.6 C) (Oral)   Resp 18   SpO2 100%   Physical Exam Vitals signs and nursing note reviewed.  Constitutional:      General: She is not in acute distress.    Appearance: She is well-developed. She is not diaphoretic.  HENT:     Head: Normocephalic and atraumatic.  Eyes:     General: No scleral icterus.    Conjunctiva/sclera: Conjunctivae normal.  Neck:     Musculoskeletal: Normal range of motion.  Cardiovascular:     Rate and Rhythm: Normal rate and regular rhythm.     Heart sounds: Normal  heart sounds. No murmur. No friction rub. No gallop.   Pulmonary:     Effort: Pulmonary effort is normal. No respiratory distress.     Breath sounds: Normal breath sounds.  Abdominal:     General: Bowel sounds are increased. There is no distension.     Palpations: Abdomen is soft. There is no mass.     Tenderness: There is no abdominal tenderness. There is no guarding.  Skin:    General: Skin is warm and dry.  Neurological:     Mental Status: She is alert and oriented to person, place, and time.  Psychiatric:        Behavior: Behavior normal.      ED Treatments / Results  Labs (all labs ordered are listed, but  only abnormal results are displayed) Labs Reviewed  CBC WITH DIFFERENTIAL/PLATELET - Abnormal; Notable for the following components:      Result Value   WBC 15.8 (*)    Neutro Abs 14.2 (*)    Lymphs Abs 0.5 (*)    All other components within normal limits  COMPREHENSIVE METABOLIC PANEL - Abnormal; Notable for the following components:   Potassium 2.9 (*)    Glucose, Bld 151 (*)    BUN 25 (*)    Calcium 8.6 (*)    All other components within normal limits  LIPASE, BLOOD - Abnormal; Notable for the following components:   Lipase 178 (*)    All other components within normal limits  GASTROINTESTINAL PANEL BY PCR, STOOL (REPLACES STOOL CULTURE)  URINALYSIS, ROUTINE W REFLEX MICROSCOPIC  URINALYSIS, COMPLETE (UACMP) WITH MICROSCOPIC    EKG None  Radiology Ct Abdomen Pelvis W Contrast  Result Date: 05/15/2019 CLINICAL DATA:  Postprandial abdominal pain with vomiting and diarrhea EXAM: CT ABDOMEN AND PELVIS WITH CONTRAST TECHNIQUE: Multidetector CT imaging of the abdomen and pelvis was performed using the standard protocol following bolus administration of intravenous contrast. CONTRAST:  137m OMNIPAQUE IOHEXOL 300 MG/ML  SOLN COMPARISON:  12/18/2012 FINDINGS: Lower chest: No acute abnormality. Previously seen right lung base nodule is not visualized on today's exam.  Hepatobiliary: No focal liver abnormality is seen. Status post cholecystectomy. No biliary dilatation. Pancreas: Unremarkable. No pancreatic ductal dilatation or surrounding inflammatory changes. Spleen: Normal in size without focal abnormality. Adrenals/Urinary Tract: Adrenal glands are within normal limits. Kidneys demonstrate no renal calculi or obstructive changes. Normal excretion of contrast is noted bilaterally. The bladder is decompressed. Stomach/Bowel: Fluid-filled loops of colon and small bowel are noted without obstructive changes. The appendix is not visualized consistent with a prior surgical history. Stomach is within normal limits. Vascular/Lymphatic: Aortic atherosclerosis. No enlarged abdominal or pelvic lymph nodes. Reproductive: Uterus and bilateral adnexa are unremarkable. Other: Fat containing ventral hernia slightly larger than that seen on the prior exam. No free fluid is noted. Musculoskeletal: Mild degenerative changes of lumbar spine are seen. No acute bony abnormality is noted. IMPRESSION: No acute abnormality noted. Slight enlargement of fat containing ventral hernia. Electronically Signed   By: MInez CatalinaM.D.   On: 05/15/2019 22:20    Procedures Procedures (including critical care time)  Medications Ordered in ED Medications  HYDROmorphone (DILAUDID) injection 0.5 mg (0.5 mg Intravenous Not Given 05/15/19 2037)  sodium chloride (PF) 0.9 % injection (has no administration in time range)  0.9 %  sodium chloride infusion (0 mLs Intravenous Stopped 05/15/19 2128)  ondansetron (ZOFRAN) injection 4 mg (4 mg Intravenous Given 05/15/19 2037)  dicyclomine (BENTYL) injection 10 mg (10 mg Intramuscular Given 05/15/19 2125)  iohexol (OMNIPAQUE) 300 MG/ML solution 100 mL (100 mLs Intravenous Contrast Given 05/15/19 2152)     Initial Impression / Assessment and Plan / ED Course  I have reviewed the triage vital signs and the nursing notes.  Pertinent labs & imaging results that were  available during my care of the patient were reviewed by me and considered in my medical decision making (see chart for details).       CMW:[id number]: HUU:VOZDGUYis gathered by patient  and review of EMR. DDX:.The differential diagnosis of diarrhea includes but is not limited to Viral- norovirus/rotavirus; Bacterial-Campylobacter,Shigella, Salmonella, Escherichia coli, E. coli 0157:H7, Yersinia enterocolitica, Vibrio cholerae, Clostridium difficile. Parasitic- Giardia lamblia, Cryptosporidium,Entamoeba histolytica,Cyclospora, Microsporidium. Toxin- Staphylococcus aureus, Bacillus cereus. Noninfectious causes include GI Bleed, Appendicitis,  Mesenteric Ischemia, Diverticulitis, Adrenal Crisis, Thyroid Storm, Toxicologic exposures, Antibiotic or drug-associated, inflammatory bowel disease. Labs: I reviewed the labs which show Hypokalemia, leukocytosis, elevated blood glucose, elevated lipase. Imaging: I per dated blood glucose sonally reviewed the images (CT abdomen and pelvis) which show(s) no acute abnormalities EKG:n/a MDM: With diarrhea.  She does not want to stay for CT scan however given her abnormal labs I was able to convince the patient that she should be evaluated.  Patient has sudden onset of diarrhea this evening at 5:30 PM.  She does not currently have risk factors for C. difficile.  I did order a stool culture which is pending.  Patient although her lipase is elevated CT scan shows no abnormalities.  She is nontender on her abdominal exam.  Patient given oral potassium repletion, Lomotil, Zofran.  Feel she is appropriate for discharge at this time.  She should return for worsening in her symptoms, inability to hold down foods or fluid. Patient disposition: Discharge Patient condition: Stable. The patient appears reasonably screened and/or stabilized for discharge and I doubt any other medical condition or other [facility] requiring further screening, evaluation, or treatment in the ED at this  time prior to discharge. I have discussed lab and/or imaging findings with the patient and answered all questions/concerns to the best of my ability. I have discussed return precautions and OP follow up.       Final Clinical Impressions(s) / ED Diagnoses   Final diagnoses:  Abdominal cramping  Diarrhea, unspecified type  Nausea  Hypokalemia  Elevated lipase    ED Discharge Orders         Ordered    diphenoxylate-atropine (LOMOTIL) 2.5-0.025 MG tablet  4 times daily PRN     05/15/19 2312    potassium chloride SA (K-DUR) 20 MEQ tablet  Daily     05/15/19 2312    ondansetron (ZOFRAN) 4 MG tablet  Every 8 hours PRN     05/15/19 2312    dicyclomine (BENTYL) 20 MG tablet  2 times daily     05/15/19 2312           Margarita Mail, PA-C 05/15/19 [place], [place], DO 05/18/19 1503

## 2019-05-16 ENCOUNTER — Encounter: Payer: Self-pay | Admitting: Nurse Practitioner

## 2019-05-16 LAB — GASTROINTESTINAL PANEL BY PCR, STOOL (REPLACES STOOL CULTURE)

## 2019-06-11 ENCOUNTER — Ambulatory Visit: Payer: Medicare Other | Admitting: Nurse Practitioner

## 2019-06-12 ENCOUNTER — Other Ambulatory Visit: Payer: Self-pay | Admitting: Nurse Practitioner

## 2019-06-12 DIAGNOSIS — E876 Hypokalemia: Secondary | ICD-10-CM

## 2019-06-13 ENCOUNTER — Other Ambulatory Visit: Payer: Self-pay

## 2019-06-13 ENCOUNTER — Other Ambulatory Visit: Payer: Medicare Other

## 2019-06-13 ENCOUNTER — Ambulatory Visit: Payer: Medicare Other | Admitting: Nurse Practitioner

## 2019-06-13 DIAGNOSIS — E876 Hypokalemia: Secondary | ICD-10-CM

## 2019-06-13 LAB — BMP8+EGFR
BUN/Creatinine Ratio: 25 (ref 12–28)
BUN: 16 mg/dL (ref 8–27)
CO2: 27 mmol/L (ref 20–29)
Calcium: 9.6 mg/dL (ref 8.7–10.3)
Chloride: 98 mmol/L (ref 96–106)
Creatinine, Ser: 0.64 mg/dL (ref 0.57–1.00)
GFR calc Af Amer: 107 mL/min/{1.73_m2} (ref >59)
GFR calc non Af Amer: 93 mL/min/{1.73_m2} (ref >59)
Glucose: 80 mg/dL (ref 65–99)
Potassium: 4.1 mmol/L (ref 3.5–5.2)
Sodium: 140 mmol/L (ref 134–144)

## 2019-06-19 ENCOUNTER — Encounter: Payer: Self-pay | Admitting: Nurse Practitioner

## 2019-06-28 ENCOUNTER — Telehealth: Payer: Self-pay

## 2019-06-28 NOTE — Telephone Encounter (Signed)
Called patient to see if she was interested in changing AWV to an earlier date. Stated she will keep previously scheduled appointment.

## 2019-07-01 ENCOUNTER — Other Ambulatory Visit: Payer: Self-pay | Admitting: Nurse Practitioner

## 2019-07-01 DIAGNOSIS — G4700 Insomnia, unspecified: Secondary | ICD-10-CM

## 2019-07-02 NOTE — Telephone Encounter (Signed)
Patient requesting a 90 day supply

## 2019-07-13 ENCOUNTER — Telehealth: Payer: Self-pay

## 2019-07-13 NOTE — Telephone Encounter (Signed)
I called pt to see if she would be willing to start a statin medication to decrease her risk for cardiac disease she stated she has an appointment next week and would like to wait until then to talk about it. YRL,RMA

## 2019-07-18 ENCOUNTER — Encounter: Payer: Self-pay | Admitting: Nurse Practitioner

## 2019-07-18 ENCOUNTER — Other Ambulatory Visit: Payer: Self-pay

## 2019-07-18 ENCOUNTER — Ambulatory Visit
Admission: RE | Admit: 2019-07-18 | Discharge: 2019-07-18 | Disposition: A | Discharge status: Home / Self Care | Payer: Medicare Other | Source: Ambulatory Visit | Attending: Nurse Practitioner | Admitting: Nurse Practitioner

## 2019-07-18 ENCOUNTER — Ambulatory Visit (INDEPENDENT_AMBULATORY_CARE_PROVIDER_SITE_OTHER): Payer: Medicare Other | Admitting: Nurse Practitioner

## 2019-07-18 VITALS — BP 128/80 | HR 79 | Temp 98.7°F | Ht 62.8 in | Wt 207.0 lb

## 2019-07-18 DIAGNOSIS — M2550 Pain in unspecified joint: Secondary | ICD-10-CM | POA: Diagnosis not present

## 2019-07-18 DIAGNOSIS — G8929 Other chronic pain: Secondary | ICD-10-CM

## 2019-07-18 DIAGNOSIS — M11261 Other chondrocalcinosis, right knee: Secondary | ICD-10-CM | POA: Diagnosis not present

## 2019-07-18 DIAGNOSIS — R05 Cough: Secondary | ICD-10-CM

## 2019-07-18 DIAGNOSIS — R7303 Prediabetes: Secondary | ICD-10-CM

## 2019-07-18 DIAGNOSIS — E039 Hypothyroidism, unspecified: Secondary | ICD-10-CM | POA: Diagnosis not present

## 2019-07-18 DIAGNOSIS — I10 Essential (primary) hypertension: Secondary | ICD-10-CM

## 2019-07-18 DIAGNOSIS — M1711 Unilateral primary osteoarthritis, right knee: Secondary | ICD-10-CM | POA: Diagnosis not present

## 2019-07-18 DIAGNOSIS — M1712 Unilateral primary osteoarthritis, left knee: Secondary | ICD-10-CM | POA: Diagnosis not present

## 2019-07-18 DIAGNOSIS — M25562 Pain in left knee: Secondary | ICD-10-CM

## 2019-07-18 DIAGNOSIS — M25462 Effusion, left knee: Secondary | ICD-10-CM | POA: Diagnosis not present

## 2019-07-18 DIAGNOSIS — M25561 Pain in right knee: Secondary | ICD-10-CM | POA: Diagnosis not present

## 2019-07-18 DIAGNOSIS — R059 Cough, unspecified: Secondary | ICD-10-CM

## 2019-07-18 MED ORDER — HYDROCODONE-HOMATROPINE 5-1.5 MG/5ML PO SYRP: 5.0000 mL | ORAL_SOLUTION | Freq: Four times a day (QID) | ORAL | 0 refills | Status: DC | PRN

## 2019-07-18 MED ORDER — ROSUVASTATIN CALCIUM 5 MG PO TABS: 5.0000 mg | ORAL_TABLET | Freq: Every day | ORAL | 1 refills | Status: DC

## 2019-07-18 MED ORDER — LEVOTHYROXINE SODIUM 100 MCG PO TABS: ORAL_TABLET | ORAL | 1 refills | Status: DC

## 2019-07-18 NOTE — Progress Notes (Signed)
Subjective:     Patient ID: Deborah [name] , female    DOB: [birth year] , 68 y.o.   MRN: [id number]   Chief Complaint  Patient presents with  . Insomnia    patient presents today for a 6 week med check    HPI  She is no longer taking the Trazadone stopped after having a vomiting and diarrhea episode requiring a visit to the ER.  She had a low potassium of 2.9, repeat was done one month later was up to 4.1.    Insomnia Primary symptoms: no sleep disturbance.  The current episode started more than one month. The onset quality is gradual. The problem is unchanged. The symptoms are relieved by activity. PMH includes: associated symptoms present.  Diabetes She presents for her follow-up diabetic visit. She has type 2 diabetes mellitus. Her disease course has been stable. There are no hypoglycemic associated symptoms. There are no diabetic associated symptoms. Pertinent negatives for diabetes include no blurred vision and no chest pain. There are no hypoglycemic complications. Symptoms are stable. There are no diabetic complications. Risk factors for coronary artery disease include diabetes mellitus, obesity and sedentary lifestyle. She is compliant with treatment most of the time. She is following a generally unhealthy diet. When asked about meal planning, she reported none. There is no change in her home blood glucose trend. (Blood sugars are 130's since being back to work and eating more possibly.) An ACE inhibitor/angiotensin II receptor blocker is being taken. She does not see a podiatrist.Eye exam is not current.     Past Medical History:  Diagnosis Date  . Death of child 89   Due to cord strangulation.  . Diabetes mellitus without complication ([place])   . Hypertension      Family History  Problem Relation Age of Onset  . COPD Mother   . Diabetes Mother   . Hypertension Mother   . Heart disease Father   . Hypertension Father   . Glaucoma Father      Current Outpatient  Medications:  .  Blood Glucose Monitoring Suppl (ACURA BLOOD GLUCOSE METER) w/Device KIT, Check blood sugars twice daily. Please provide patient with meter and supplies that is covered by pt ins., Disp: 1 kit, Rfl: 0 .  cholecalciferol (VITAMIN D) 1000 units tablet, Take 2,000 Units by mouth daily., Disp: , Rfl:  .  cyclobenzaprine (FLEXERIL) 10 MG tablet, Take 10 mg by mouth 3 (three) times daily as needed for muscle spasms., Disp: , Rfl:  .  docusate sodium (COLACE) 100 MG capsule, Take 100 mg by mouth daily., Disp: , Rfl:  .  glimepiride (AMARYL) 2 MG tablet, TAKE ONE TABLET (2MG) BY MOUTH EVERY DAY (Patient taking differently: Take 2 mg by mouth daily. ), Disp: 90 tablet, Rfl: 1 .  hydrALAZINE (APRESOLINE) 50 MG tablet, Take 1 tablet (50 mg total) by mouth 2 (two) times a day., Disp: 180 tablet, Rfl: 1 .  hydrochlorothiazide (HYDRODIURIL) 50 MG tablet, Take 1 tablet by mouth daily, Disp: 90 tablet, Rfl: 1 .  HYDROcodone-homatropine (HYDROMET) 5-1.5 MG/5ML syrup, Take 5 mLs by mouth every 6 (six) hours as needed for cough., Disp: 120 mL, Rfl: 0 .  Magnesium 250 MG TABS, Take 1 tablet by mouth daily., Disp: , Rfl:  .  ONE TOUCH ULTRA TEST test strip, USE WITH METER TO CHECK  BLOOD SUGAR TWO TIMES DAILY , BEFORE BREAKFAST AND  BEFORE DINNER, Disp: 100 each, Rfl: 1 .  SYNTHROID 100 MCG tablet, TAKE  1 TABLET BY MOUTH DAILY BEFORE BREAKFAST. (Patient taking differently: Take 100 mcg by mouth daily before breakfast. ), Disp: 90 tablet, Rfl: 1 .  traZODone (DESYREL) 50 MG tablet, TAKE 1/2 TABLET BY MOUTH EVERY DAY AT BEDTIME (Patient not taking: Reported on 07/18/2019), Disp: 45 tablet, Rfl: 2   Allergies  Allergen Reactions  . Aspartame And Phenylalanine Other (See Comments)    Causes migraines  . Erythromycin     Ask patient to specify type/severity/reaction. Not indicated on medical history form dated 11/04/10.  . Ibuprofen     Ask patient to specify type/severity/reaction. Not noted on medical  history form dated 11/04/10.  Marland Kitchen Phenylalanine Other (See Comments)    Causes migraine  . Prilosec [Omeprazole] Nausea And Vomiting  . Tramadol Nausea And Vomiting  . Zantac [Ranitidine Hcl]      Review of Systems  Constitutional: Negative.   Eyes: Negative for blurred vision.  Respiratory: Positive for cough (chronic).   Cardiovascular: Negative.  Negative for chest pain, palpitations and leg swelling.  Musculoskeletal: Positive for arthralgias and back pain (upper back thoracic area).  Psychiatric/Behavioral: Negative for sleep disturbance. The patient has insomnia.      Today's Vitals   07/18/19 0846  BP: 128/80  Pulse: 79  Temp: 98.7 F (37.1 C)  TempSrc: Oral  Weight: 207 lb (93.9 kg)  Height: 5' 2.8" (1.595 m)  PainSc: 8   PainLoc: Back   Body mass index is 36.9 kg/m.   Objective:  Physical Exam Constitutional:      Appearance: Normal appearance.  Cardiovascular:     Rate and Rhythm: Normal rate and regular rhythm.     Pulses: Normal pulses.     Heart sounds: Normal heart sounds. No murmur.  Pulmonary:     Effort: Pulmonary effort is normal. No respiratory distress.     Breath sounds: Normal breath sounds.  Musculoskeletal:        General: Tenderness (bilateral posterior thoracic back area) present. No swelling.     Right lower leg: No edema.     Left lower leg: No edema.  Skin:    General: Skin is warm and dry.     Capillary Refill: Capillary refill takes less than 2 seconds.     Coloration: Skin is not jaundiced.  Neurological:     General: No focal deficit present.     Mental Status: She is alert and oriented to person, place, and time.  Psychiatric:        Mood and Affect: Mood normal.        Thought Content: Thought content normal.        Judgment: Judgment normal.         Assessment And Plan:     1. Essential hypertension B/P is controlled.  CMP ordered to check renal function.  The importance of regular exercise and dietary modification  was stressed to the patient.  Stressed importance of losing ten percent of her body weight to help with B/P control.  The weight loss would help with decreasing cardiac and cancer risk as well.   2. Hypothyroidism, unspecified type  Chronic, controlled  Continue with current medications  3. Prediabetes  Chronic, she is now in prediabetic range  She has had slightly higher blood sugars possibly related to being back at work and eating more bad foods  Continue with current medications  Encouraged to limit intake of sugary foods and drinks  Encouraged to increase physical activity to 150 minutes per week -  rosuvastatin (CRESTOR) 5 MG tablet; Take 1 tablet (5 mg total) by mouth at bedtime.  Dispense: 30 tablet; Refill: 1  4. Cough  Chronic, hydromet works the best for her  Check PDMP no refill since April for controlled substances - rosuvastatin (CRESTOR) 5 MG tablet; Take 1 tablet (5 mg total) by mouth at bedtime.  Dispense: 30 tablet; Refill: 1 - HYDROcodone-homatropine (HYDROMET) 5-1.5 MG/5ML syrup; Take 5 mLs by mouth every 6 (six) hours as needed for cough.  Dispense: 120 mL; Refill: 0  5. Chronic pain of both knees  She does not want to go to the orthopedic yet, would like to have xrays done, pending results will refer to orthopedic if necessary.  She is not interested in a pain cream or a toradol injection (due to medication sensitivies)  Negative drawer test - DG Knee Complete 4 Views Left; Future - DG Knee Complete 4 Views Right; Future - Rheumatoid (RA) Factor - ACE (Angiotensin 1 Converting Enzyme)  6. Arthralgia, unspecified joint Since pain all over will check autoimmune panel Encouraged to decrease intake of gluten - Autoimmune Profile   Minette Brine, FNP    THE PATIENT IS ENCOURAGED TO PRACTICE SOCIAL DISTANCING DUE TO THE COVID-19 PANDEMIC.

## 2019-07-19 LAB — AUTOIMMUNE PROFILE
Anti Nuclear Antibody (ANA): NEGATIVE
Complement C3, Serum: 164 mg/dL (ref 82–167)
dsDNA Ab: <1 IU/mL (ref 0–9)

## 2019-07-19 LAB — ANGIOTENSIN CONVERTING ENZYME: Angio Convert Enzyme: 39 U/L (ref 14–82)

## 2019-07-19 LAB — RHEUMATOID FACTOR: Rheumatoid fact SerPl-aCnc: <10 IU/mL (ref 0.0–13.9)

## 2019-07-24 ENCOUNTER — Encounter: Payer: Self-pay | Admitting: Nurse Practitioner

## 2019-07-24 DIAGNOSIS — H43813 Vitreous degeneration, bilateral: Secondary | ICD-10-CM | POA: Diagnosis not present

## 2019-07-24 DIAGNOSIS — H2513 Age-related nuclear cataract, bilateral: Secondary | ICD-10-CM | POA: Diagnosis not present

## 2019-07-24 DIAGNOSIS — H40053 Ocular hypertension, bilateral: Secondary | ICD-10-CM | POA: Diagnosis not present

## 2019-08-07 ENCOUNTER — Encounter: Payer: Self-pay | Admitting: Nurse Practitioner

## 2019-08-07 ENCOUNTER — Other Ambulatory Visit: Payer: Self-pay | Admitting: Nurse Practitioner

## 2019-08-09 ENCOUNTER — Other Ambulatory Visit: Payer: Self-pay | Admitting: Nurse Practitioner

## 2019-08-09 DIAGNOSIS — R05 Cough: Secondary | ICD-10-CM

## 2019-08-09 DIAGNOSIS — R7303 Prediabetes: Secondary | ICD-10-CM

## 2019-08-09 DIAGNOSIS — R059 Cough, unspecified: Secondary | ICD-10-CM

## 2019-08-15 ENCOUNTER — Other Ambulatory Visit: Payer: Self-pay | Admitting: Nurse Practitioner

## 2019-08-15 ENCOUNTER — Encounter: Payer: Self-pay | Admitting: Nurse Practitioner

## 2019-08-15 DIAGNOSIS — G8929 Other chronic pain: Secondary | ICD-10-CM

## 2019-08-15 DIAGNOSIS — M2550 Pain in unspecified joint: Secondary | ICD-10-CM

## 2019-08-15 DIAGNOSIS — M25562 Pain in left knee: Secondary | ICD-10-CM

## 2019-08-16 ENCOUNTER — Telehealth: Payer: Self-pay

## 2019-08-16 NOTE — Telephone Encounter (Signed)
I have left pt a v/m and Janece has also wrote her back on mychart. We just wanted to notify her that the referral is being worked on and they will contact her with an appointment date and time soon. YRL,RMA

## 2019-08-23 DIAGNOSIS — H40053 Ocular hypertension, bilateral: Secondary | ICD-10-CM | POA: Diagnosis not present

## 2019-08-23 DIAGNOSIS — H43813 Vitreous degeneration, bilateral: Secondary | ICD-10-CM | POA: Diagnosis not present

## 2019-08-23 DIAGNOSIS — H53423 Scotoma of blind spot area, bilateral: Secondary | ICD-10-CM | POA: Diagnosis not present

## 2019-08-23 DIAGNOSIS — H2513 Age-related nuclear cataract, bilateral: Secondary | ICD-10-CM | POA: Diagnosis not present

## 2019-08-23 LAB — HM DIABETES EYE EXAM

## 2019-08-24 ENCOUNTER — Telehealth: Payer: Self-pay

## 2019-08-24 ENCOUNTER — Encounter: Payer: Self-pay | Admitting: Orthopedic Surgery

## 2019-08-24 ENCOUNTER — Ambulatory Visit (INDEPENDENT_AMBULATORY_CARE_PROVIDER_SITE_OTHER): Payer: Medicare Other | Admitting: Orthopedic Surgery

## 2019-08-24 VITALS — Ht 62.8 in | Wt 207.0 lb

## 2019-08-24 DIAGNOSIS — M170 Bilateral primary osteoarthritis of knee: Secondary | ICD-10-CM

## 2019-08-24 MED ORDER — LIDOCAINE HCL 1 % IJ SOLN
5.0000 mL | INTRAMUSCULAR | Status: AC | PRN
  Administered 2019-08-24: 10:00:00 5 mL

## 2019-08-24 MED ORDER — LIDOCAINE HCL 1 % IJ SOLN
5.0000 mL | INTRAMUSCULAR | Status: AC | PRN
  Administered 2019-08-24: 5 mL

## 2019-08-24 MED ORDER — METHYLPREDNISOLONE ACETATE 40 MG/ML IJ SUSP
40.0000 mg | INTRAMUSCULAR | Status: AC | PRN
  Administered 2019-08-24: 40 mg via INTRA_ARTICULAR

## 2019-08-24 MED ORDER — BUPIVACAINE HCL 0.25 % IJ SOLN
4.0000 mL | INTRAMUSCULAR | Status: AC | PRN
  Administered 2019-08-24: 4 mL via INTRA_ARTICULAR

## 2019-08-24 NOTE — Telephone Encounter (Signed)
Bilateral knee gel injections 

## 2019-08-24 NOTE — Progress Notes (Signed)
Office Visit Note   Patient: Deborah [name]           Date of Birth: 1951/07/08           MRN: [id number] Visit Date: 08/24/2019 Requested by: Minette Brine, [address] [place],  [place] [address] PCP: Minette Brine, FNP  Subjective: Chief Complaint  Patient presents with  . Right Knee - Pain  . Left Knee - Pain    HPI: Barnetta Chapel is a patient with long history of bilateral knee pain.  She recently retired from her work on the floor as a Engineer, civil (consulting).  She reports pain which does limit her ability to ambulate.  Denies much in the way of mechanical symptoms.  Outside radiographs do demonstrate end-stage arthritis in both knees with spurring present worse in the medial compartment.  She wants to avoid any type of surgical intervention.  She does take over-the-counter medications which help some.  Does not have any pain that keeps her awake at night.  She does have some pain which limits her walking endurance              ROS: All systems reviewed are negative as they relate to the chief complaint within the history of present illness.  Patient denies  fevers or chills.   Assessment & Plan: Visit Diagnoses:  1. Primary osteoarthritis of both knees     Plan: Impression is bilateral knee arthritis in a patient who has end-stage arthritis in both knees.  Plan is non-loadbearing quad strengthening exercises along with bilateral cortisone injection today.  We will pre-approve her for Synvisc and gel injections also.  Will start that once some once her symptoms come back.  Follow-up with me has needed in about 3 to 4 weeks once those cortisone shots wear off.  Follow-Up Instructions: Return if symptoms worsen or fail to improve.   Orders:  No orders of the defined types were placed in this encounter.  No orders of the defined types were placed in this encounter.     Procedures: Large Joint Inj: bilateral knee on 08/24/2019 10:15 AM Indications: diagnostic  evaluation, joint swelling and pain Details: 18 G 1.5 in needle, superolateral approach  Arthrogram: No  Medications (Right): 5 mL lidocaine 1 %; 4 mL bupivacaine 0.25 %; 40 mg methylPREDNISolone acetate 40 MG/ML Medications (Left): 5 mL lidocaine 1 %; 4 mL bupivacaine 0.25 %; 40 mg methylPREDNISolone acetate 40 MG/ML Outcome: tolerated well, no immediate complications Procedure, treatment alternatives, risks and benefits explained, specific risks discussed. Consent was given by the patient. Immediately prior to procedure a time out was called to verify the correct patient, procedure, equipment, support staff and site/side marked as required. Patient was prepped and draped in the usual sterile fashion.       Clinical Data: No additional findings.  Objective: Vital Signs: Ht 5' 2.8" (1.595 m)   Wt 207 lb (93.9 kg)   BMI 36.90 kg/m   Physical Exam:   Constitutional: Patient appears well-developed HEENT:  Head: Normocephalic Eyes:EOM are normal Neck: Normal range of motion Cardiovascular: Normal rate Pulmonary/chest: Effort normal Neurologic: Patient is alert Skin: Skin is warm Psychiatric: Patient has normal mood and affect    Ortho Exam: Ortho exam demonstrates close to full extension bilaterally with mild effusion left knee no effusion right knee.  Pedal pulses palpable.  Extensor mechanism is intact.  Collateral cruciate ligaments are stable.  No groin pain with internal X rotation of the leg.  Gait  close to normal.  Specialty Comments:  No specialty comments available.  Imaging: No results found.   PMFS History: Patient Active Problem List   Diagnosis Date Noted  . Insomnia 04/30/2019  . Seasonal allergies 03/27/2019  . Prediabetes 01/11/2019  . Hypothyroidism 01/11/2019  . Vitamin D deficiency 01/11/2019  . Cough 01/11/2019  . Fatigue 01/11/2019  . Uncontrolled type 2 diabetes mellitus with hyperglycemia ([place]) 10/25/2018  . Type 2 diabetes mellitus ([place])  09/16/2018  . High blood pressure 06/08/2011  . SOB (shortness of breath) 06/08/2011  . Generalized headaches 06/08/2011  . Weight loss 06/08/2011  . Menopause 06/08/2011   Past Medical History:  Diagnosis Date  . Death of child 86   Due to cord strangulation.  . Diabetes mellitus without complication ([place])   . Hypertension     Family History  Problem Relation Age of Onset  . COPD Mother   . Diabetes Mother   . Hypertension Mother   . Heart disease Father   . Hypertension Father   . Glaucoma Father     Past Surgical History:  Procedure Laterality Date  . APPENDECTOMY  1974  . CARDIAC CATHETERIZATION     two, dates not provided.  . CHOLECYSTECTOMY     confirm date with patient, was it 39?  Marland Kitchen LAPAROSCOPIC TUBAL LIGATION  1987   Social History   Occupational History  . Occupation: retired  Tobacco Use  . Smoking status: Never Smoker  . Smokeless tobacco: Never Used  Substance and Sexual Activity  . Alcohol use: Yes    Comment: glass of wine or mixed drink on occasion.  . Drug use: No  . Sexual activity: Not Currently

## 2019-08-31 NOTE — Telephone Encounter (Signed)
Noted  

## 2019-09-03 ENCOUNTER — Telehealth: Payer: Self-pay

## 2019-09-03 NOTE — Telephone Encounter (Signed)
Submitted VOB for SynviscOne, bilateral knee. 

## 2019-09-04 ENCOUNTER — Telehealth: Payer: Self-pay

## 2019-09-04 NOTE — Telephone Encounter (Signed)
Called and left a VM for patient to CB to schedule appointment for gel injection with Dr. Marlou Sa.  Approved for SynviscOne, bilateral knee. [place] Patient will be responsible for 20% OOP. No Co-pay No PA required

## 2019-09-14 ENCOUNTER — Other Ambulatory Visit: Payer: Self-pay | Admitting: Nurse Practitioner

## 2019-09-14 DIAGNOSIS — R05 Cough: Secondary | ICD-10-CM

## 2019-09-14 DIAGNOSIS — R059 Cough, unspecified: Secondary | ICD-10-CM

## 2019-09-14 DIAGNOSIS — R7303 Prediabetes: Secondary | ICD-10-CM

## 2019-09-20 ENCOUNTER — Other Ambulatory Visit: Payer: Self-pay | Admitting: Nurse Practitioner

## 2019-09-20 DIAGNOSIS — E039 Hypothyroidism, unspecified: Secondary | ICD-10-CM

## 2019-10-03 ENCOUNTER — Other Ambulatory Visit: Payer: Self-pay | Admitting: Nurse Practitioner

## 2019-10-03 DIAGNOSIS — R05 Cough: Secondary | ICD-10-CM

## 2019-10-03 DIAGNOSIS — R7303 Prediabetes: Secondary | ICD-10-CM

## 2019-10-03 DIAGNOSIS — R059 Cough, unspecified: Secondary | ICD-10-CM

## 2019-10-09 ENCOUNTER — Other Ambulatory Visit: Payer: Self-pay | Admitting: Nurse Practitioner

## 2019-10-10 ENCOUNTER — Other Ambulatory Visit: Payer: Self-pay | Admitting: Nurse Practitioner

## 2019-10-10 DIAGNOSIS — I10 Essential (primary) hypertension: Secondary | ICD-10-CM

## 2019-10-15 ENCOUNTER — Other Ambulatory Visit: Payer: Self-pay

## 2019-10-15 NOTE — Patient Outreach (Signed)
[facility]) Care Management  10/15/2019  Deborah [name] Apr 30, 1951 [id number]   Medication Adherence call to Deborah [name] [facility] Identifiers Verify spoke with patient she is past due on Rosuvastatin 5 mg she explain she takes 1 tablet once a week not 1 tablet daily,patient explain she  takes it this way per doctors instructions.Deborah [name] is showing past due under [place].   [facility] Direct Dial [phone]  Fax [phone] [email or web address]

## 2019-10-17 ENCOUNTER — Ambulatory Visit: Payer: Medicare Other

## 2019-10-17 ENCOUNTER — Encounter: Payer: Self-pay | Admitting: Nurse Practitioner

## 2019-10-17 ENCOUNTER — Ambulatory Visit (INDEPENDENT_AMBULATORY_CARE_PROVIDER_SITE_OTHER): Payer: Medicare Other

## 2019-10-17 ENCOUNTER — Other Ambulatory Visit: Payer: Self-pay

## 2019-10-17 ENCOUNTER — Ambulatory Visit (INDEPENDENT_AMBULATORY_CARE_PROVIDER_SITE_OTHER): Payer: Medicare Other | Admitting: Nurse Practitioner

## 2019-10-17 ENCOUNTER — Other Ambulatory Visit (HOSPITAL_COMMUNITY)
Admission: RE | Admit: 2019-10-17 | Discharge: 2019-10-17 | Disposition: A | Discharge status: Home / Self Care | Payer: Medicare Other | Source: Ambulatory Visit | Attending: Nurse Practitioner | Admitting: Nurse Practitioner

## 2019-10-17 VITALS — BP 170/98 | HR 68 | Temp 98.3°F | Ht 63.4 in | Wt 206.0 lb

## 2019-10-17 VITALS — BP 160/90 | HR 68 | Temp 98.3°F | Ht 63.4 in | Wt 206.4 lb

## 2019-10-17 DIAGNOSIS — Z1231 Encounter for screening mammogram for malignant neoplasm of breast: Secondary | ICD-10-CM | POA: Diagnosis not present

## 2019-10-17 DIAGNOSIS — Z0000 Encounter for general adult medical examination without abnormal findings: Secondary | ICD-10-CM

## 2019-10-17 DIAGNOSIS — E559 Vitamin D deficiency, unspecified: Secondary | ICD-10-CM

## 2019-10-17 DIAGNOSIS — R7303 Prediabetes: Secondary | ICD-10-CM

## 2019-10-17 DIAGNOSIS — E039 Hypothyroidism, unspecified: Secondary | ICD-10-CM | POA: Diagnosis not present

## 2019-10-17 DIAGNOSIS — Z124 Encounter for screening for malignant neoplasm of cervix: Secondary | ICD-10-CM | POA: Diagnosis not present

## 2019-10-17 DIAGNOSIS — I10 Essential (primary) hypertension: Secondary | ICD-10-CM | POA: Diagnosis not present

## 2019-10-17 DIAGNOSIS — Z139 Encounter for screening, unspecified: Secondary | ICD-10-CM

## 2019-10-17 LAB — POCT URINALYSIS DIPSTICK
Bilirubin, UA: NEGATIVE
Blood, UA: NEGATIVE
Glucose, UA: NEGATIVE
Ketones, UA: NEGATIVE
Leukocytes, UA: NEGATIVE
Nitrite, UA: NEGATIVE
Protein, UA: NEGATIVE
Spec Grav, UA: 1.02 (ref 1.010–1.025)
Urobilinogen, UA: 0.2 E.U./dL
pH, UA: 7 (ref 5.0–8.0)

## 2019-10-17 LAB — POCT UA - MICROALBUMIN
Albumin/Creatinine Ratio, Urine, POC: <30
Creatinine, POC: 100 mg/dL
Microalbumin Ur, POC: 10 mg/L

## 2019-10-17 LAB — HM PAP SMEAR: HM Pap smear: NORMAL

## 2019-10-17 MED ORDER — TETANUS-DIPHTH-ACELL PERTUSSIS 5-2.5-18.5 LF-MCG/0.5 IM SUSP: 0.5000 mL | Freq: Once | INTRAMUSCULAR | 0 refills | Status: AC

## 2019-10-17 NOTE — Patient Instructions (Signed)
Health Maintenance After Age 67 After age 67, you are at a higher risk for certain long-term diseases and infections as well as injuries from falls. Falls are a major cause of broken bones and head injuries in people who are older than age 67. Getting regular preventive care can help to keep you healthy and well. Preventive care includes getting regular testing and making lifestyle changes as recommended by your health care provider. Talk with your health care provider about:  Which screenings and tests you should have. A screening is a test that checks for a disease when you have no symptoms.  A diet and exercise plan that is right for you. What should I know about screenings and tests to prevent falls? Screening and testing are the best ways to find a health problem early. Early diagnosis and treatment give you the best chance of managing medical conditions that are common after age 67. Certain conditions and lifestyle choices may make you more likely to have a fall. Your health care provider may recommend:  Regular vision checks. Poor vision and conditions such as cataracts can make you more likely to have a fall. If you wear glasses, make sure to get your prescription updated if your vision changes.  Medicine review. Work with your health care provider to regularly review all of the medicines you are taking, including over-the-counter medicines. Ask your health care provider about any side effects that may make you more likely to have a fall. Tell your health care provider if any medicines that you take make you feel dizzy or sleepy.  Osteoporosis screening. Osteoporosis is a condition that causes the bones to get weaker. This can make the bones weak and cause them to break more easily.  Blood pressure screening. Blood pressure changes and medicines to control blood pressure can make you feel dizzy.  Strength and balance checks. Your health care provider may recommend certain tests to check your  strength and balance while standing, walking, or changing positions.  Foot health exam. Foot pain and numbness, as well as not wearing proper footwear, can make you more likely to have a fall.  Depression screening. You may be more likely to have a fall if you have a fear of falling, feel emotionally low, or feel unable to do activities that you used to do.  Alcohol use screening. Using too much alcohol can affect your balance and may make you more likely to have a fall. What actions can I take to lower my risk of falls? General instructions  Talk with your health care provider about your risks for falling. Tell your health care provider if: ? You fall. Be sure to tell your health care provider about all falls, even ones that seem minor. ? You feel dizzy, sleepy, or off-balance.  Take over-the-counter and prescription medicines only as told by your health care provider. These include any supplements.  Eat a healthy diet and maintain a healthy weight. A healthy diet includes low-fat dairy products, low-fat (lean) meats, and fiber from whole grains, beans, and lots of fruits and vegetables. Home safety  Remove any tripping hazards, such as rugs, cords, and clutter.  Install safety equipment such as grab bars in bathrooms and safety rails on stairs.  Keep rooms and walkways well-lit. Activity   Follow a regular exercise program to stay fit. This will help you maintain your balance. Ask your health care provider what types of exercise are appropriate for you.  If you need a cane or   walker, use it as recommended by your health care provider.  Wear supportive shoes that have nonskid soles. Lifestyle  Do not drink alcohol if your health care provider tells you not to drink.  If you drink alcohol, limit how much you have: ? 0-1 drink a day for women. ? 0-2 drinks a day for men.  Be aware of how much alcohol is in your drink. In the [place] one drink equals one typical bottle of beer (12  oz), one-half glass of wine (5 oz), or one shot of hard liquor (1 oz).  Do not use any products that contain nicotine or tobacco, such as cigarettes and e-cigarettes. If you need help quitting, ask your health care provider. Summary  Having a healthy lifestyle and getting preventive care can help to protect your health and wellness after age 67.  Screening and testing are the best way to find a health problem early and help you avoid having a fall. Early diagnosis and treatment give you the best chance for managing medical conditions that are more common for people who are older than age 67.  Falls are a major cause of broken bones and head injuries in people who are older than age 67. Take precautions to prevent a fall at home.  Work with your health care provider to learn what changes you can make to improve your health and wellness and to prevent falls. This information is not intended to replace advice given to you by your health care provider. Make sure you discuss any questions you have with your health care provider. Document Released: 10/19/2017 Document Revised: 03/29/2019 Document Reviewed: 10/19/2017 Elsevier Patient Education  2020 [employer]  

## 2019-10-17 NOTE — Progress Notes (Signed)
Subjective:   Deborah [name] is a 68 y.o. female who presents for Medicare Annual (Subsequent) preventive examination.  Review of Systems:  n/a Cardiac Risk Factors include: advanced age (>55mn, >>69women);hypertension;obesity (BMI >30kg/m2);sedentary lifestyle     Objective:     Vitals: BP (!) 160/90 (BP Location: Left Arm, Patient Position: Sitting, Cuff Size: Large)   Pulse 68   Temp 98.3 F (36.8 C) (Oral)   Ht 5' 3.4" (1.61 m)   Wt 206 lb 6.4 oz (93.6 kg)   SpO2 97%   BMI 36.10 kg/m   Body mass index is 36.1 kg/m.  Advanced Directives 10/17/2019 10/11/2018 07/05/2017 07/17/2015  Does Patient Have a Medical Advance Directive? Yes No No No  Type of [place] - - -  Copy of [place] [employer] Chart? No - copy requested - - -  Would patient like information on creating a medical advance directive? - Yes (MAU/Ambulatory/Procedural Areas - Information given) - No - patient declined information    Tobacco Social History   Tobacco Use  Smoking Status Never Smoker  Smokeless Tobacco Never Used     Counseling given: Not Answered   Clinical Intake:  Pre-visit preparation completed: Yes  Pain : No/denies pain     Nutritional Status: BMI > 30  Obese Nutritional Risks: None Diabetes: Yes CBG done?: No Did pt. bring in CBG monitor from home?: No  How often do you need to have someone help you when you read instructions, pamphlets, or other written materials from your doctor or pharmacy?: 1 - Never What is the last grade level you completed in school?: BSN  Interpreter Needed?: No  Information entered by :: NAllen LPN  Past Medical History:  Diagnosis Date  . Death of child 89  Due to cord strangulation.  . Diabetes mellitus without complication ([place]   . Hypertension    Past Surgical History:  Procedure Laterality Date  . APPENDECTOMY  1974  . CARDIAC CATHETERIZATION     two, dates not  provided.  . CHOLECYSTECTOMY     confirm date with patient, was it 89  .Marland KitchenLAPAROSCOPIC TUBAL LIGATION  1987   Family History  Problem Relation Age of Onset  . COPD Mother   . Diabetes Mother   . Hypertension Mother   . Heart disease Father   . Hypertension Father   . Glaucoma Father    Social History   Socioeconomic History  . Marital status: Married    Spouse name: Not on file  . Number of children: Not on file  . Years of education: Not on file  . Highest education level: Not on file  Occupational History  . Occupation: retired  SScientific laboratory technician . Financial resource strain: Not hard at all  . Food insecurity    Worry: Never true    Inability: Never true  . Transportation needs    Medical: No    Non-medical: No  Tobacco Use  . Smoking status: Never Smoker  . Smokeless tobacco: Never Used  Substance and Sexual Activity  . Alcohol use: Not Currently  . Drug use: No  . Sexual activity: Not Currently  Lifestyle  . Physical activity    Days per week: 0 days    Minutes per session: 0 min  . Stress: Not at all  Relationships  . Social [place] phone: Not on file    Gets together: Not on file  Attends religious service: Not on file    Active member of club or organization: Not on file    Attends meetings of clubs or organizations: Not on file    Relationship status: Not on file  Other Topics Concern  . Not on file  Social History Narrative  . Not on file    Outpatient Encounter Medications as of 10/17/2019  Medication Sig  . Blood Glucose Monitoring Suppl (ACURA BLOOD GLUCOSE METER) w/Device KIT Check blood sugars twice daily. Please provide patient with meter and supplies that is covered by pt ins.  . cholecalciferol (VITAMIN D) 1000 units tablet Take 2,000 Units by mouth daily.  . cyclobenzaprine (FLEXERIL) 10 MG tablet Take 10 mg by mouth 3 (three) times daily as needed for muscle spasms.  Marland Kitchen docusate sodium (COLACE) 100 MG capsule Take 100 mg  by mouth daily.  Marland Kitchen glimepiride (AMARYL) 2 MG tablet TAKE ONE TABLET (2MG) BY MOUTH EVERY DAY  . hydrALAZINE (APRESOLINE) 50 MG tablet TAKE 1 TABLET BY MOUTH TWICE A DAY  . hydrochlorothiazide (HYDRODIURIL) 50 MG tablet Take 1 tablet by mouth daily  . HYDROcodone-homatropine (HYDROMET) 5-1.5 MG/5ML syrup Take 5 mLs by mouth every 6 (six) hours as needed for cough.  . levothyroxine (SYNTHROID) 100 MCG tablet TAKE 1 TABLET BY MOUTH DAILY BEFORE BREAKFAST.  . Magnesium 250 MG TABS Take 1 tablet by mouth daily.  Glory Rosebush ULTRA test strip USE WITH METER TO CHECK  BLOOD SUGAR TWO TIMES  DAILY, BEFORE BREAKFAST AND BEFORE DINNER  . rosuvastatin (CRESTOR) 5 MG tablet TAKE 1 TABLET BY MOUTH EVERYDAY AT BEDTIME   No facility-administered encounter medications on file as of 10/17/2019.     Activities of Daily Living In your present state of health, do you have any difficulty performing the following activities: 10/17/2019  Hearing? N  Vision? N  Difficulty concentrating or making decisions? N  Walking or climbing stairs? Y  Comment sometimes, knee problems  Dressing or bathing? N  Doing errands, shopping? N  Preparing Food and eating ? N  Using the Toilet? N  In the past six months, have you accidently leaked urine? Y  Comment all the time  Do you have problems with loss of bowel control? N  Managing your Medications? N  Managing your Finances? N  Housekeeping or managing your Housekeeping? N  Some recent data might be hidden    Patient Care Team: Minette Brine, FNP as PCP - General (General Practice)    Assessment:   This is a routine wellness examination for [name].  Exercise Activities and Dietary recommendations Current Exercise Habits: The patient does not participate in regular exercise at present  Goals    . Patient Stated     10/17/2019, no goals set at this time    . Weight (lb) < 200 lb (90.7 kg) (pt-stated)     Going to silver sneakers 3 times a week       Fall  Risk Fall Risk  10/17/2019 07/18/2019 04/30/2019 04/12/2019 03/27/2019  Falls in the past year? 0 0 0 0 0  Number falls in past yr: 0 - - - -  Risk for fall due to : Medication side effect - - - -  Follow up Falls evaluation completed;Education provided;Falls prevention discussed - - - -   Is the patient's home free of loose throw rugs in walkways, pet beds, electrical cords, etc?   yes      Grab bars in the bathroom? yes  Handrails on the stairs?   n/a      Adequate lighting?   yes  Timed Get Up and Go performed: n/a  Depression Screen PHQ 2/9 Scores 10/17/2019 07/18/2019 04/30/2019 04/12/2019  PHQ - 2 Score 0 0 0 0  PHQ- 9 Score 2 - - -     Cognitive Function     6CIT Screen 10/17/2019 10/11/2018  What Year? 0 points 0 points  What month? 0 points 0 points  What time? 0 points 0 points  Count back from 20 0 points 0 points  Months in reverse 0 points 0 points  Repeat phrase 2 points 0 points  Total Score 2 0    Immunization History  Administered Date(s) Administered  . Influenza, High Dose Seasonal PF 10/11/2018  . Pneumococcal Conjugate-13 10/17/2018    Qualifies for Shingles Vaccine? yes  Screening Tests Health Maintenance  Topic Date Due  . FOOT EXAM  05/23/1962  . TETANUS/TDAP  05/24/1971  . COLONOSCOPY  05/23/2002  . HEMOGLOBIN A1C  10/12/2019  . DEXA SCAN  10/16/2020 (Originally 05/23/2017)  . MAMMOGRAM  10/29/2019  . OPHTHALMOLOGY EXAM  11/10/2019  . INFLUENZA VACCINE  Completed  . Hepatitis C Screening  Completed  . PNA vac Low Risk Adult  Completed    Cancer Screenings: Lung: Low Dose CT Chest recommended if Age 72-80 years, 30 pack-year currently smoking OR have quit w/in 15years. Patient does not qualify. Breast:  Up to date on Mammogram? No   Up to date of Bone Density/Dexa? No Colorectal: will do mail in  Additional Screenings: : Hepatitis C Screening: 09/2018     Plan:    Patient does not have any goals set at this time.   I have  personally reviewed and noted the following in the patient's chart:   . Medical and social history . Use of alcohol, tobacco or illicit drugs  . Current medications and supplements . Functional ability and status . Nutritional status . Physical activity . Advanced directives . List of other physicians . Hospitalizations, surgeries, and ER visits in previous 12 months . Vitals . Screenings to include cognitive, depression, and falls . Referrals and appointments  In addition, I have reviewed and discussed with patient certain preventive protocols, quality metrics, and best practice recommendations. A written personalized care plan for preventive services as well as general preventive health recommendations were provided to patient.     Kellie Simmering, LPN  70/14/1030

## 2019-10-17 NOTE — Patient Instructions (Signed)
Deborah [name] , Thank you for taking time to come for your Medicare Wellness Visit. I appreciate your ongoing commitment to your health goals. Please review the following plan we discussed and let me know if I can assist you in the future.   Screening recommendations/referrals: Colonoscopy: going to do a mail in screening Mammogram: 10/2017 Bone Density: decline Recommended yearly ophthalmology/optometry visit for glaucoma screening and checkup Recommended yearly dental visit for hygiene and checkup  Vaccinations: Influenza vaccine: 09/2019 Pneumococcal vaccine: 09/2018 Tdap vaccine: believes had within 10 years Shingles vaccine: discussed    Advanced directives: Advance directive discussed with you today. I have provided a copy for you to complete at home and have notarized. Once this is complete please bring a copy in to our office so we can scan it into your chart.   Conditions/risks identified: obesity  Next appointment: 10/17/2019   Preventive Care 32 Years and Older, Female Preventive care refers to lifestyle choices and visits with your health care provider that can promote health and wellness. What does preventive care include?  A yearly physical exam. This is also called an annual well check.  Dental exams once or twice a year.  Routine eye exams. Ask your health care provider how often you should have your eyes checked.  Personal lifestyle choices, including:  Daily care of your teeth and gums.  Regular physical activity.  Eating a healthy diet.  Avoiding tobacco and drug use.  Limiting alcohol use.  Practicing safe sex.  Taking low-dose aspirin every day.  Taking vitamin and mineral supplements as recommended by your health care provider. What happens during an annual well check? The services and screenings done by your health care provider during your annual well check will depend on your age, overall health, lifestyle risk factors, and family history of  disease. Counseling  Your health care provider may ask you questions about your:  Alcohol use.  Tobacco use.  Drug use.  Emotional well-being.  Home and relationship well-being.  Sexual activity.  Eating habits.  History of falls.  Memory and ability to understand (cognition).  Work and work Statistician.  Reproductive health. Screening  You may have the following tests or measurements:  Height, weight, and BMI.  Blood pressure.  Lipid and cholesterol levels. These may be checked every 5 years, or more frequently if you are over 22 years old.  Skin check.  Lung cancer screening. You may have this screening every year starting at age 76 if you have a 30-pack-year history of smoking and currently smoke or have quit within the past 15 years.  Fecal occult blood test (FOBT) of the stool. You may have this test every year starting at age 66.  Flexible sigmoidoscopy or colonoscopy. You may have a sigmoidoscopy every 5 years or a colonoscopy every 10 years starting at age 66.  Hepatitis C blood test.  Hepatitis B blood test.  Sexually transmitted disease (STD) testing.  Diabetes screening. This is done by checking your blood sugar (glucose) after you have not eaten for a while (fasting). You may have this done every 1-3 years.  Bone density scan. This is done to screen for osteoporosis. You may have this done starting at age 82.  Mammogram. This may be done every 1-2 years. Talk to your health care provider about how often you should have regular mammograms. Talk with your health care provider about your test results, treatment options, and if necessary, the need for more tests. Vaccines  Your health care  provider may recommend certain vaccines, such as:  Influenza vaccine. This is recommended every year.  Tetanus, diphtheria, and acellular pertussis (Tdap, Td) vaccine. You may need a Td booster every 10 years.  Zoster vaccine. You may need this after age 14.   Pneumococcal 13-valent conjugate (PCV13) vaccine. One dose is recommended after age 59.  Pneumococcal polysaccharide (PPSV23) vaccine. One dose is recommended after age 63. Talk to your health care provider about which screenings and vaccines you need and how often you need them. This information is not intended to replace advice given to you by your health care provider. Make sure you discuss any questions you have with your health care provider. Document Released: 01/02/2016 Document Revised: 08/25/2016 Document Reviewed: 10/07/2015 Elsevier Interactive Patient Education  2017 [place] Prevention in the Home Falls can cause injuries. They can happen to people of all ages. There are many things you can do to make your home safe and to help prevent falls. What can I do on the outside of my home?  Regularly fix the edges of walkways and driveways and fix any cracks.  Remove anything that might make you trip as you walk through a door, such as a raised step or threshold.  Trim any bushes or trees on the path to your home.  Use bright outdoor lighting.  Clear any walking paths of anything that might make someone trip, such as rocks or tools.  Regularly check to see if handrails are loose or broken. Make sure that both sides of any steps have handrails.  Any raised decks and porches should have guardrails on the edges.  Have any leaves, snow, or ice cleared regularly.  Use sand or salt on walking paths during winter.  Clean up any spills in your garage right away. This includes oil or grease spills. What can I do in the bathroom?  Use night lights.  Install grab bars by the toilet and in the tub and shower. Do not use towel bars as grab bars.  Use non-skid mats or decals in the tub or shower.  If you need to sit down in the shower, use a plastic, non-slip stool.  Keep the floor dry. Clean up any water that spills on the floor as soon as it happens.  Remove soap  buildup in the tub or shower regularly.  Attach bath mats securely with double-sided non-slip rug tape.  Do not have throw rugs and other things on the floor that can make you trip. What can I do in the bedroom?  Use night lights.  Make sure that you have a light by your bed that is easy to reach.  Do not use any sheets or blankets that are too big for your bed. They should not hang down onto the floor.  Have a firm chair that has side arms. You can use this for support while you get dressed.  Do not have throw rugs and other things on the floor that can make you trip. What can I do in the kitchen?  Clean up any spills right away.  Avoid walking on wet floors.  Keep items that you use a lot in easy-to-reach places.  If you need to reach something above you, use a strong step stool that has a grab bar.  Keep electrical cords out of the way.  Do not use floor polish or wax that makes floors slippery. If you must use wax, use non-skid floor wax.  Do not have throw  rugs and other things on the floor that can make you trip. What can I do with my stairs?  Do not leave any items on the stairs.  Make sure that there are handrails on both sides of the stairs and use them. Fix handrails that are broken or loose. Make sure that handrails are as long as the stairways.  Check any carpeting to make sure that it is firmly attached to the stairs. Fix any carpet that is loose or worn.  Avoid having throw rugs at the top or bottom of the stairs. If you do have throw rugs, attach them to the floor with carpet tape.  Make sure that you have a light switch at the top of the stairs and the bottom of the stairs. If you do not have them, ask someone to add them for you. What else can I do to help prevent falls?  Wear shoes that:  Do not have high heels.  Have rubber bottoms.  Are comfortable and fit you well.  Are closed at the toe. Do not wear sandals.  If you use a stepladder:  Make  sure that it is fully opened. Do not climb a closed stepladder.  Make sure that both sides of the stepladder are locked into place.  Ask someone to hold it for you, if possible.  Clearly mark and make sure that you can see:  Any grab bars or handrails.  First and last steps.  Where the edge of each step is.  Use tools that help you move around (mobility aids) if they are needed. These include:  Canes.  Walkers.  Scooters.  Crutches.  Turn on the lights when you go into a dark area. Replace any light bulbs as soon as they burn out.  Set up your furniture so you have a clear path. Avoid moving your furniture around.  If any of your floors are uneven, fix them.  If there are any pets around you, be aware of where they are.  Review your medicines with your doctor. Some medicines can make you feel dizzy. This can increase your chance of falling. Ask your doctor what other things that you can do to help prevent falls. This information is not intended to replace advice given to you by your health care provider. Make sure you discuss any questions you have with your health care provider. Document Released: 10/02/2009 Document Revised: 05/13/2016 Document Reviewed: 01/10/2015 Elsevier Interactive Patient Education  2017 [employer].

## 2019-10-17 NOTE — Progress Notes (Addendum)
Subjective:     Patient ID: Deborah [name] , female    DOB: 05/01/1951 , 68 y.o.   MRN: [id number]   Chief Complaint  Patient presents with  . Annual Exam    HPI  Here for HM   The patient states she uses post menopausal status for birth control. Last LMP was No LMP recorded. Patient is postmenopausal..  Mammogram last done 10/31/2017.  Negative for: breast discharge, breast lump(s), breast pain and breast self exam.  Pertinent negatives include abnormal bleeding (hematology), anxiety, decreased libido, depression, difficulty falling sleep, dyspareunia, history of infertility, nocturia, sexual dysfunction, sleep disturbances, urinary incontinence, urinary urgency, vaginal discharge and vaginal itching. Diet regular, she does try to limit the amount of sweets. The patient states her exercise level is  none since the gym closed due to the coronovirus pandemic.    The patient's tobacco use is:  Social History   Tobacco Use  Smoking Status Never Smoker  Smokeless Tobacco Never Used   She has been exposed to passive smoke. The patient's alcohol use is:  Social History   Substance and Sexual Activity  Alcohol Use Not Currently   Additional information: Last pap 2015, next one scheduled for.   Past Medical History:  Diagnosis Date  . Death of child 80   Due to cord strangulation.  . Diabetes mellitus without complication ([place])   . Hypertension      Family History  Problem Relation Age of Onset  . COPD Mother   . Diabetes Mother   . Hypertension Mother   . Heart disease Father   . Hypertension Father   . Glaucoma Father      Current Outpatient Medications:  .  Blood Glucose Monitoring Suppl (ACURA BLOOD GLUCOSE METER) w/Device KIT, Check blood sugars twice daily. Please provide patient with meter and supplies that is covered by pt ins., Disp: 1 kit, Rfl: 0 .  cholecalciferol (VITAMIN D) 1000 units tablet, Take 2,000 Units by mouth daily., Disp: , Rfl:  .   cyclobenzaprine (FLEXERIL) 10 MG tablet, Take 10 mg by mouth 3 (three) times daily as needed for muscle spasms., Disp: , Rfl:  .  docusate sodium (COLACE) 100 MG capsule, Take 100 mg by mouth daily., Disp: , Rfl:  .  glimepiride (AMARYL) 2 MG tablet, TAKE ONE TABLET (2MG) BY MOUTH EVERY DAY, Disp: 90 tablet, Rfl: 1 .  hydrALAZINE (APRESOLINE) 50 MG tablet, TAKE 1 TABLET BY MOUTH TWICE A DAY, Disp: 180 tablet, Rfl: 1 .  hydrochlorothiazide (HYDRODIURIL) 50 MG tablet, Take 1 tablet by mouth daily, Disp: 90 tablet, Rfl: 1 .  HYDROcodone-homatropine (HYDROMET) 5-1.5 MG/5ML syrup, Take 5 mLs by mouth every 6 (six) hours as needed for cough., Disp: 120 mL, Rfl: 0 .  levothyroxine (SYNTHROID) 100 MCG tablet, TAKE 1 TABLET BY MOUTH DAILY BEFORE BREAKFAST., Disp: 90 tablet, Rfl: 1 .  Magnesium 250 MG TABS, Take 1 tablet by mouth daily., Disp: , Rfl:  .  ONETOUCH ULTRA test strip, USE WITH METER TO CHECK  BLOOD SUGAR TWO TIMES  DAILY, BEFORE BREAKFAST AND BEFORE DINNER, Disp: 200 strip, Rfl: 3 .  rosuvastatin (CRESTOR) 5 MG tablet, TAKE 1 TABLET BY MOUTH EVERYDAY AT BEDTIME, Disp: 90 tablet, Rfl: 1   Allergies  Allergen Reactions  . Aspartame And Phenylalanine Other (See Comments)    Causes migraines  . Erythromycin     Ask patient to specify type/severity/reaction. Not indicated on medical history form dated 11/04/10.  . Ibuprofen  Ask patient to specify type/severity/reaction. Not noted on medical history form dated 11/04/10.  Marland Kitchen Phenylalanine Other (See Comments)    Causes migraine  . Prilosec [Omeprazole] Nausea And Vomiting  . Tramadol Nausea And Vomiting  . Zantac [Ranitidine Hcl]      Review of Systems  Constitutional: Negative.   HENT: Negative.   Eyes: Negative.   Respiratory: Negative.   Cardiovascular: Negative.   Gastrointestinal: Negative.   Endocrine: Negative.   Genitourinary: Negative.   Musculoskeletal: Negative.   Skin: Negative.   Allergic/Immunologic: Negative.    Neurological: Negative.   Hematological: Negative.   Psychiatric/Behavioral: Negative.      Today's Vitals   10/17/19 0846  BP: (!) 170/98  Pulse: 68  Temp: 98.3 F (36.8 C)  TempSrc: Oral  Weight: 206 lb (93.4 kg)  Height: 5' 3.4" (1.61 m)   Body mass index is 36.03 kg/m.   Objective:  Physical Exam Vitals signs reviewed.  Constitutional:      Appearance: Normal appearance. She is well-developed.  HENT:     Head: Normocephalic and atraumatic.     Right Ear: Hearing, tympanic membrane, ear canal and external ear normal.     Left Ear: Hearing, tympanic membrane, ear canal and external ear normal.  Eyes:     General: Lids are normal.     Conjunctiva/sclera: Conjunctivae normal.     Pupils: Pupils are equal, round, and reactive to light.     Funduscopic exam:    Right eye: No papilledema.        Left eye: No papilledema.  Neck:     Musculoskeletal: Full passive range of motion without pain, normal range of motion and neck supple.     Thyroid: No thyroid mass.     Vascular: No carotid bruit.  Cardiovascular:     Rate and Rhythm: Normal rate and regular rhythm.     Pulses: Normal pulses.     Heart sounds: Normal heart sounds. No murmur.  Pulmonary:     Effort: Pulmonary effort is normal.     Breath sounds: Normal breath sounds.  Abdominal:     General: Abdomen is flat. Bowel sounds are normal.     Palpations: Abdomen is soft.  Musculoskeletal: Normal range of motion.        General: No swelling.     Right lower leg: No edema.     Left lower leg: No edema.  Skin:    General: Skin is warm and dry.     Capillary Refill: Capillary refill takes less than 2 seconds.  Neurological:     General: No focal deficit present.     Mental Status: She is alert and oriented to person, place, and time.     Cranial Nerves: No cranial nerve deficit.     Sensory: No sensory deficit.  Psychiatric:        Mood and Affect: Mood normal.        Behavior: Behavior normal.         Thought Content: Thought content normal.        Judgment: Judgment normal.         Assessment And Plan:     1. Encounter for health maintenance examination . Behavior modifications discussed and diet history reviewed.   . Pt will continue to exercise regularly and modify diet with low GI, plant based foods and decrease intake of processed foods.  . Recommend intake of daily multivitamin, Vitamin D, and calcium.  . Recommend mammogram and cologuard  for preventive screenings, as well as recommend immunizations that include influenza, TDAP (she will get a copy of her immunizations from work)  2. Screening mammogram, encounter for  Pt instructed on Self Breast Exam.According to ACOG guidelines Women aged 29 and older are recommended to get an annual mammogram. Form completed and given to patient contact the [facility] for appointment scheduing.   Pt encouraged to get annual mammogram  3. Essential hypertension  Chronic, elevated today  I would like to add another medication however she is really sensitive to medications so I will have to see what other medications she has taken for her blood pressure  EKG NSR with HR 66 - POCT Urinalysis Dipstick (81002) - POCT UA - Microalbumin - EKG 12-Lead - MM DIGITAL SCREENING BILATERAL; Future - CMP14+EGFR - CBC  4. Encounter for Papanicolaou smear of cervix No abnormal findings on physical exam - Cytology -Pap Smear  5. Encounter for screening  - Cervicovaginal ancillary only  6. Prediabetes  Chronic, controlled  Continue with current medications  Encouraged to limit intake of sugary foods and drinks  Encouraged to increase physical activity to 150 minutes per week - Hemoglobin A1c - CMP14+EGFR - CBC  7. Hypothyroidism, unspecified type  Chronic, controlled  Continue with current medications - CBC - TSH - T3, free - T4  8. Vitamin D deficiency  Will check vitamin D level and supplement as needed.     Also  encouraged to spend 15 minutes in the sun daily.  - VITAMIN D 25 Hydroxy (Vit-D Deficiency, Fractures)   Minette Brine, FNP    THE PATIENT IS ENCOURAGED TO PRACTICE SOCIAL DISTANCING DUE TO THE COVID-19 PANDEMIC.

## 2019-10-18 LAB — HEMOGLOBIN A1C
Est. average glucose Bld gHb Est-mCnc: 117 mg/dL
Hgb A1c MFr Bld: 5.7 % — ABNORMAL HIGH (ref 4.8–5.6)

## 2019-10-18 LAB — CERVICOVAGINAL ANCILLARY ONLY
Bacterial Vaginitis (gardnerella): NEGATIVE
Candida Glabrata: NEGATIVE
Candida Vaginitis: NEGATIVE
Chlamydia: NEGATIVE
Comment: NEGATIVE
Comment: NEGATIVE
Comment: NEGATIVE
Comment: NEGATIVE
Comment: NEGATIVE
Comment: NORMAL
Neisseria Gonorrhea: NEGATIVE
Trichomonas: NEGATIVE

## 2019-10-18 LAB — CMP14+EGFR
ALT: 16 IU/L (ref 0–32)
AST: 16 IU/L (ref 0–40)
Albumin/Globulin Ratio: 1.6 (ref 1.2–2.2)
Albumin: 4.4 g/dL (ref 3.8–4.8)
Alkaline Phosphatase: 91 IU/L (ref 39–117)
BUN/Creatinine Ratio: 25 (ref 12–28)
BUN: 13 mg/dL (ref 8–27)
Bilirubin Total: 0.4 mg/dL (ref 0.0–1.2)
CO2: 26 mmol/L (ref 20–29)
Calcium: 9.4 mg/dL (ref 8.7–10.3)
Chloride: 97 mmol/L (ref 96–106)
Creatinine, Ser: 0.53 mg/dL — ABNORMAL LOW (ref 0.57–1.00)
GFR calc Af Amer: 114 mL/min/{1.73_m2} (ref >59)
GFR calc non Af Amer: 99 mL/min/{1.73_m2} (ref >59)
Globulin, Total: 2.8 g/dL (ref 1.5–4.5)
Glucose: 63 mg/dL — ABNORMAL LOW (ref 65–99)
Potassium: 4.1 mmol/L (ref 3.5–5.2)
Sodium: 139 mmol/L (ref 134–144)
Total Protein: 7.2 g/dL (ref 6.0–8.5)

## 2019-10-18 LAB — CBC
Hematocrit: 35.9 % (ref 34.0–46.6)
Hemoglobin: 11.9 g/dL (ref 11.1–15.9)
MCH: 28.3 pg (ref 26.6–33.0)
MCHC: 33.1 g/dL (ref 31.5–35.7)
MCV: 86 fL (ref 79–97)
Platelets: 395 10*3/uL (ref 150–450)
RBC: 4.2 x10E6/uL (ref 3.77–5.28)
RDW: 13.2 % (ref 11.7–15.4)
WBC: 5.1 10*3/uL (ref 3.4–10.8)

## 2019-10-18 LAB — VITAMIN D 25 HYDROXY (VIT D DEFICIENCY, FRACTURES): Vit D, 25-Hydroxy: 43.1 ng/mL (ref 30.0–100.0)

## 2019-10-18 LAB — CYTOLOGY - PAP: Diagnosis: NEGATIVE

## 2019-10-18 LAB — T4: T4, Total: 11 ug/dL (ref 4.5–12.0)

## 2019-10-18 LAB — TSH: TSH: 1.42 u[IU]/mL (ref 0.450–4.500)

## 2019-10-18 LAB — T3, FREE: T3, Free: 3.1 pg/mL (ref 2.0–4.4)

## 2019-10-22 ENCOUNTER — Other Ambulatory Visit: Payer: Self-pay | Admitting: Nurse Practitioner

## 2019-10-22 ENCOUNTER — Encounter: Payer: Self-pay | Admitting: Nurse Practitioner

## 2019-10-23 ENCOUNTER — Encounter: Payer: Self-pay | Admitting: Nurse Practitioner

## 2019-10-26 LAB — FECAL OCCULT BLOOD, IMMUNOCHEMICAL: IFOBT: POSITIVE

## 2019-10-29 ENCOUNTER — Encounter: Payer: Self-pay | Admitting: Nurse Practitioner

## 2019-10-29 ENCOUNTER — Other Ambulatory Visit: Payer: Self-pay | Admitting: Nurse Practitioner

## 2019-10-29 DIAGNOSIS — R05 Cough: Secondary | ICD-10-CM

## 2019-10-29 DIAGNOSIS — R059 Cough, unspecified: Secondary | ICD-10-CM

## 2019-10-30 ENCOUNTER — Encounter: Payer: Self-pay | Admitting: Nurse Practitioner

## 2019-10-30 MED ORDER — HYDROCODONE-HOMATROPINE 5-1.5 MG/5ML PO SYRP: 5.0000 mL | ORAL_SOLUTION | Freq: Four times a day (QID) | ORAL | 0 refills | Status: DC | PRN

## 2019-10-30 NOTE — Telephone Encounter (Signed)
hydromet refill

## 2019-10-31 ENCOUNTER — Encounter: Payer: Self-pay | Admitting: Nurse Practitioner

## 2019-10-31 ENCOUNTER — Other Ambulatory Visit: Payer: Self-pay | Admitting: Nurse Practitioner

## 2019-10-31 DIAGNOSIS — I10 Essential (primary) hypertension: Secondary | ICD-10-CM

## 2019-11-01 ENCOUNTER — Other Ambulatory Visit: Payer: Self-pay | Admitting: Nurse Practitioner

## 2019-11-02 ENCOUNTER — Other Ambulatory Visit: Payer: Self-pay

## 2019-11-02 ENCOUNTER — Encounter: Payer: Self-pay | Admitting: Orthopedic Surgery

## 2019-11-02 ENCOUNTER — Ambulatory Visit: Payer: Medicare Other | Admitting: Orthopedic Surgery

## 2019-11-02 DIAGNOSIS — M170 Bilateral primary osteoarthritis of knee: Secondary | ICD-10-CM

## 2019-11-02 MED ORDER — HYLAN G-F 20 48 MG/6ML IX SOSY
48.0000 mg | PREFILLED_SYRINGE | INTRA_ARTICULAR | Status: AC | PRN
  Administered 2019-11-02: 48 mg via INTRA_ARTICULAR

## 2019-11-02 MED ORDER — LIDOCAINE HCL 1 % IJ SOLN
5.0000 mL | INTRAMUSCULAR | Status: AC | PRN
  Administered 2019-11-02: 5 mL

## 2019-11-02 NOTE — Progress Notes (Signed)
   Procedure Note  Patient: Deborah [name]             Date of Birth: 1951/02/13           MRN: [id number]             Visit Date: 11/02/2019  Procedures: Visit Diagnoses:  1. Primary osteoarthritis of both knees     Large Joint Inj: bilateral knee on 11/02/2019 11:32 PM Indications: pain, joint swelling and diagnostic evaluation Details: 18 G 1.5 in needle, superolateral approach  Arthrogram: No  Medications (Right): 5 mL lidocaine 1 %; 48 mg Hylan 48 MG/6ML Medications (Left): 5 mL lidocaine 1 %; 48 mg Hylan 48 MG/6ML Outcome: tolerated well, no immediate complications Procedure, treatment alternatives, risks and benefits explained, specific risks discussed. Consent was given by the patient. Immediately prior to procedure a time out was called to verify the correct patient, procedure, equipment, support staff and site/side marked as required. Patient was prepped and draped in the usual sterile fashion.

## 2019-11-12 ENCOUNTER — Telehealth: Payer: Self-pay

## 2019-11-12 ENCOUNTER — Encounter: Payer: Self-pay | Admitting: Nurse Practitioner

## 2019-11-12 NOTE — Telephone Encounter (Signed)
Patient notified that her FIT test was positive and we are referring her to GI. YRL,RMA

## 2019-12-11 ENCOUNTER — Other Ambulatory Visit: Payer: Self-pay | Admitting: Nurse Practitioner

## 2019-12-11 ENCOUNTER — Other Ambulatory Visit: Payer: Self-pay

## 2019-12-11 ENCOUNTER — Ambulatory Visit
Admission: RE | Admit: 2019-12-11 | Discharge: 2019-12-11 | Disposition: A | Discharge status: Home / Self Care | Payer: Medicare Other | Source: Ambulatory Visit | Attending: Nurse Practitioner | Admitting: Nurse Practitioner

## 2019-12-11 DIAGNOSIS — I10 Essential (primary) hypertension: Secondary | ICD-10-CM

## 2019-12-11 DIAGNOSIS — Z1231 Encounter for screening mammogram for malignant neoplasm of breast: Secondary | ICD-10-CM | POA: Diagnosis not present

## 2019-12-17 ENCOUNTER — Encounter: Payer: Self-pay | Admitting: Nurse Practitioner

## 2019-12-18 ENCOUNTER — Other Ambulatory Visit: Payer: Self-pay

## 2019-12-18 ENCOUNTER — Other Ambulatory Visit: Payer: Self-pay | Admitting: Nurse Practitioner

## 2019-12-18 MED ORDER — CYCLOBENZAPRINE HCL 10 MG PO TABS: 10.0000 mg | ORAL_TABLET | Freq: Three times a day (TID) | ORAL | 0 refills | Status: DC | PRN

## 2019-12-24 IMAGING — CT CT ABDOMEN AND PELVIS WITH CONTRAST
2 of 5 series · 16 of 46 positions shown, 18 images · IV contrast (ISOVUE)
Comparison: 12/18/2012

CLINICAL DATA: Postprandial abdominal pain with vomiting and
diarrhea

EXAM:
CT ABDOMEN AND PELVIS WITH CONTRAST
TECHNIQUE: Multidetector CT imaging of the abdomen and pelvis was performed
using the standard protocol following bolus administration of
intravenous contrast.
CONTRAST:  100mL OMNIPAQUE IOHEXOL 300 MG/ML  SOLN

[Series 2: axial st · axial · 0.80mm/px · z∈[+1012,+1387]mm · 13 of 87 slices shown, 15 images]
[im 6/87  soft-tissue]
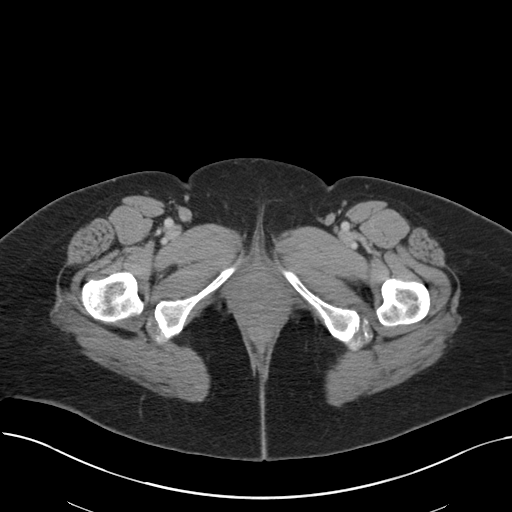
[im 6/87  bone]
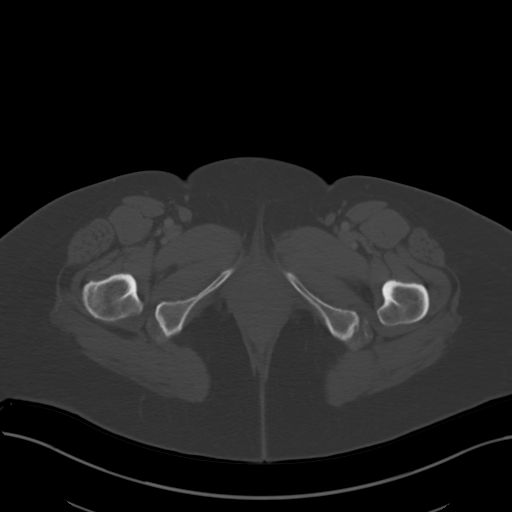
[im 12/87  soft-tissue]
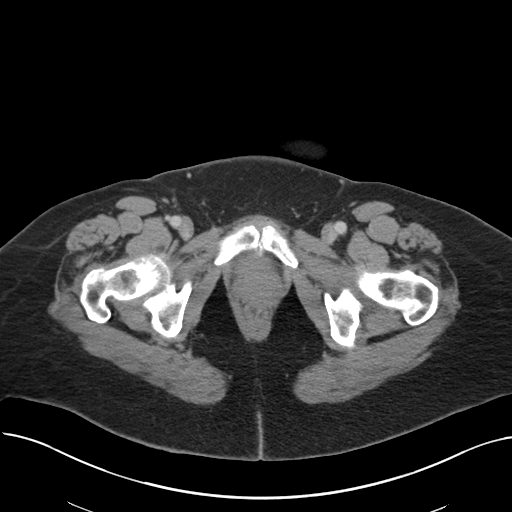
[im 18/87  soft-tissue]
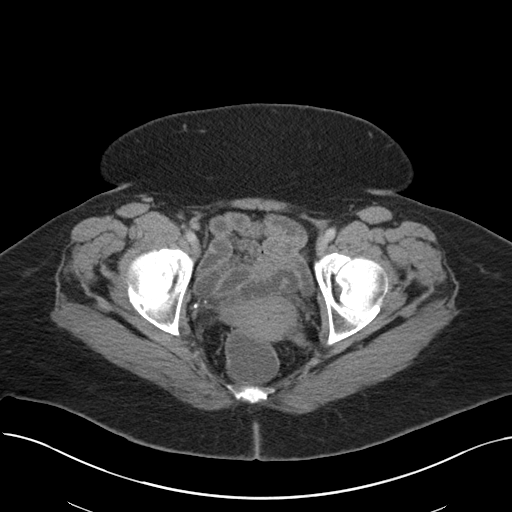
[im 23/87  soft-tissue]
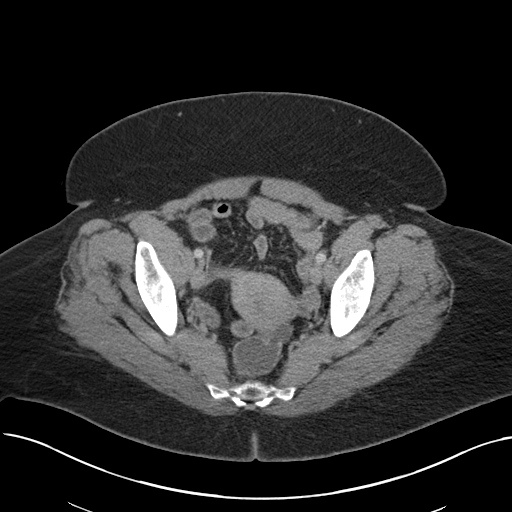
[im 29/87  soft-tissue]
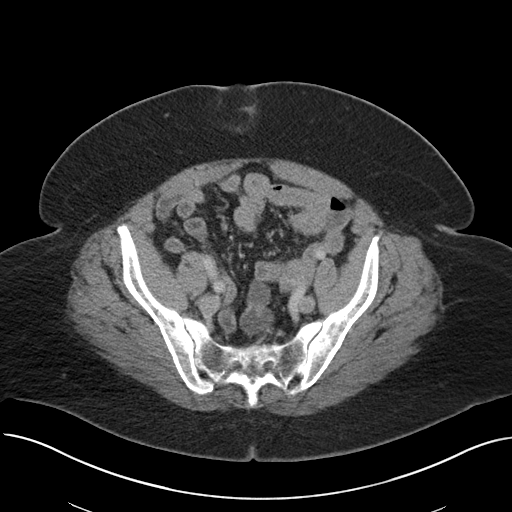
[im 35/87  soft-tissue]
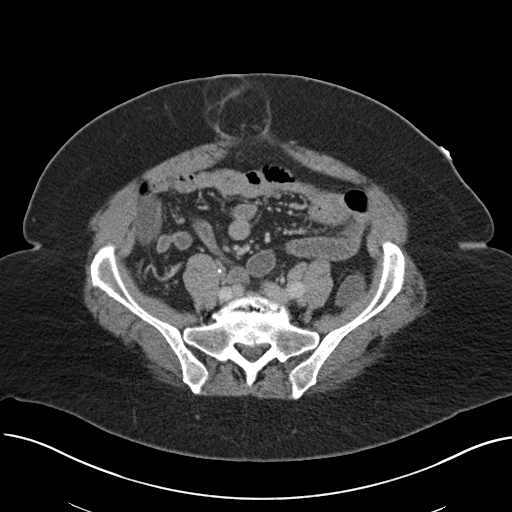
[im 46/87  soft-tissue]
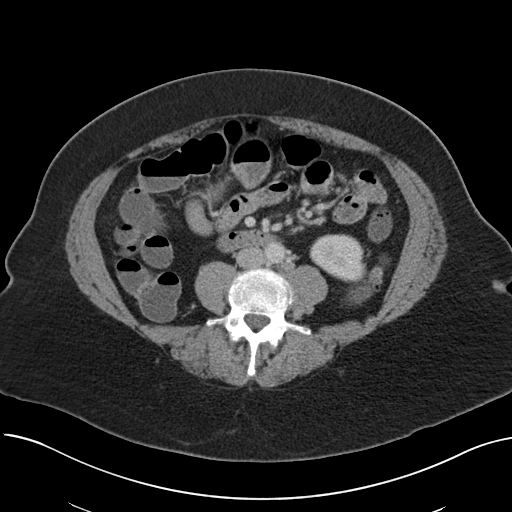
[im 52/87  soft-tissue]
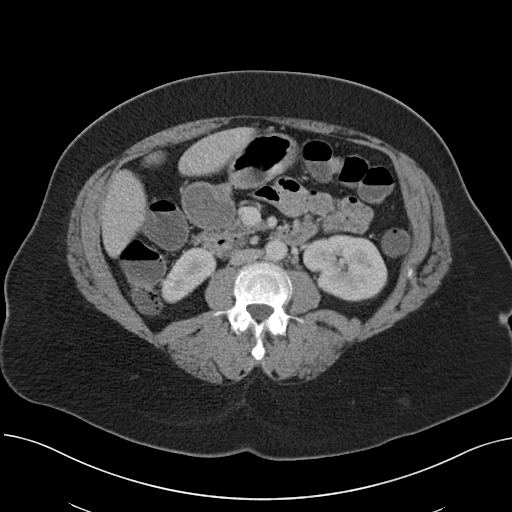
[im 58/87  soft-tissue]
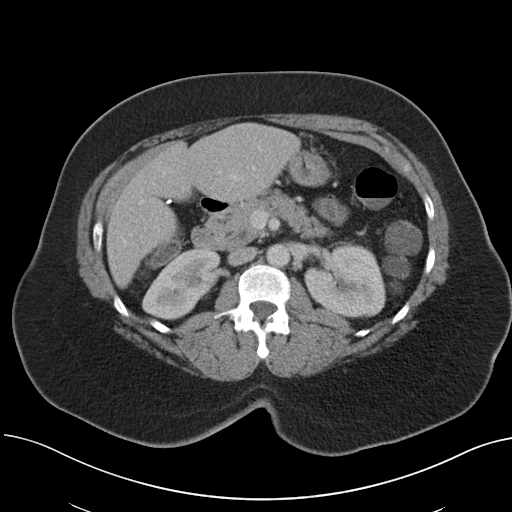
[im 58/87  bone]
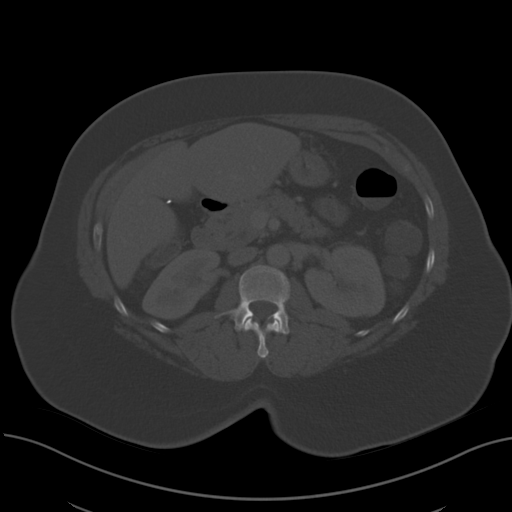
[im 64/87  soft-tissue]
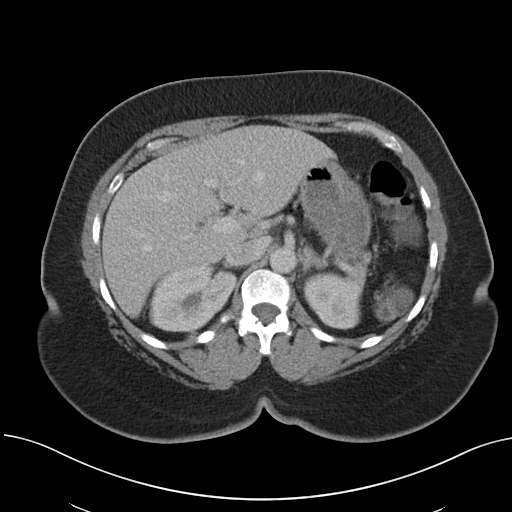
[im 69/87  soft-tissue]
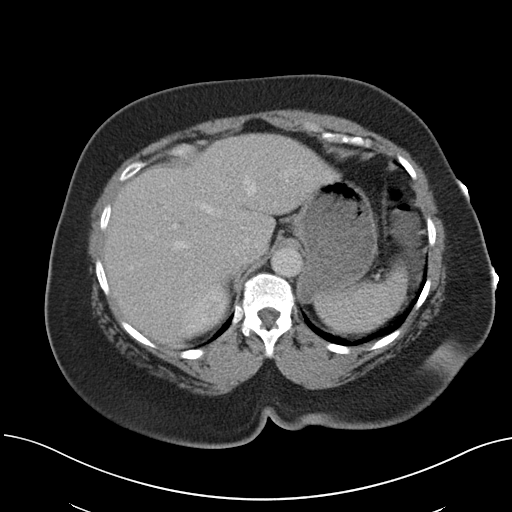
[im 75/87  soft-tissue]
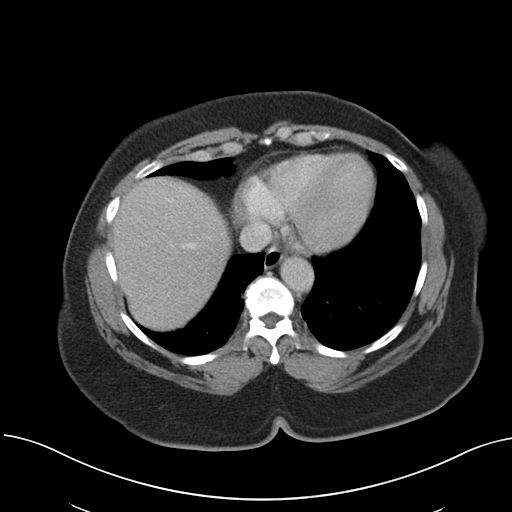
[im 81/87  soft-tissue]
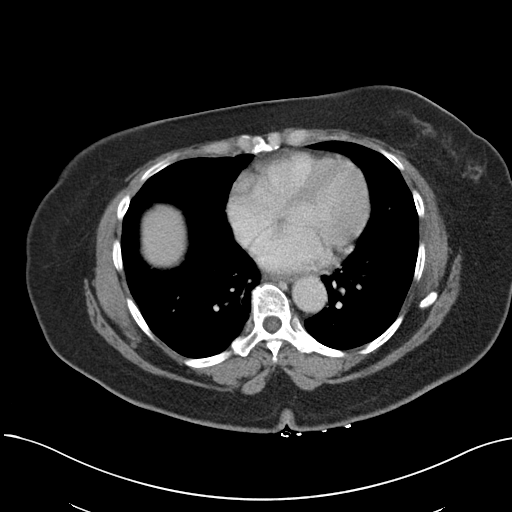

[Series 5: coronal st · coronal · 0.73mm/px · 3 of 151 slices shown]
[im 51/151  soft-tissue]
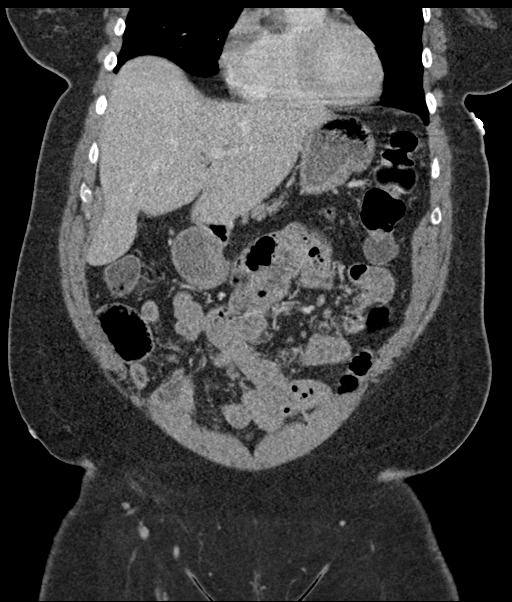
[im 67/151  soft-tissue]
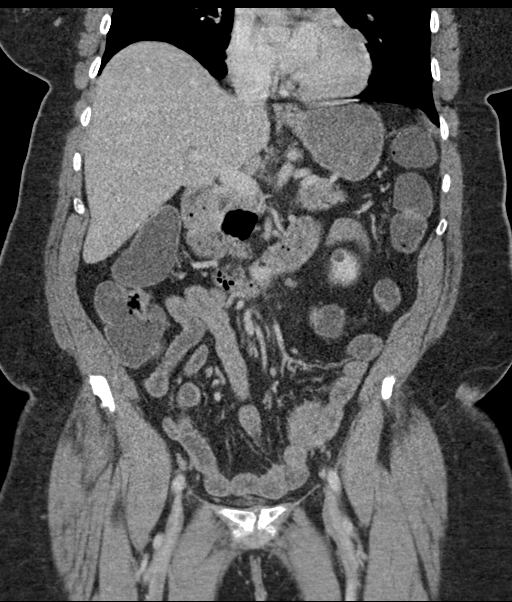
[im 84/151  soft-tissue]
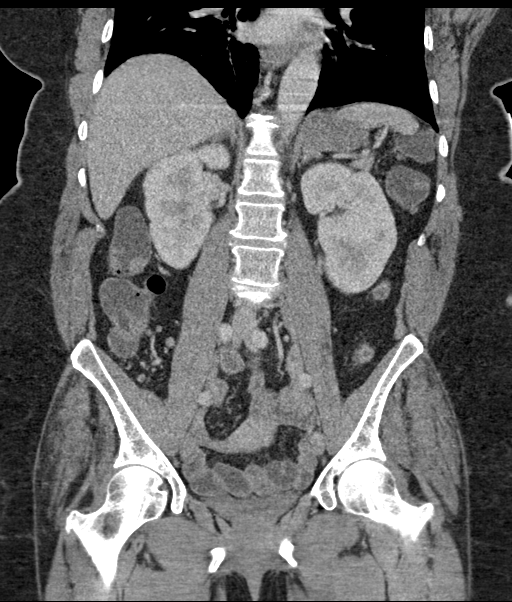

[16 of 46 positions shown; findings below may reference images not displayed]

FINDINGS: Lower chest: No acute abnormality. Previously seen right lung base
nodule is not visualized on today's exam.

Hepatobiliary: No focal liver abnormality is seen. Status post
cholecystectomy. No biliary dilatation.

Pancreas: Unremarkable. No pancreatic ductal dilatation or
surrounding inflammatory changes.

Spleen: Normal in size without focal abnormality.

Adrenals/Urinary Tract: Adrenal glands are within normal limits.
Kidneys demonstrate no renal calculi or obstructive changes. Normal
excretion of contrast is noted bilaterally. The bladder is
decompressed.

Stomach/Bowel: Fluid-filled loops of colon and small bowel are noted
without obstructive changes. The appendix is not visualized
consistent with a prior surgical history. Stomach is within normal
limits.

Vascular/Lymphatic: Aortic atherosclerosis. No enlarged abdominal or
pelvic lymph nodes.

Reproductive: Uterus and bilateral adnexa are unremarkable.

Other: Fat containing ventral hernia slightly larger than that seen
on the prior exam. No free fluid is noted.

Musculoskeletal: Mild degenerative changes of lumbar spine are seen.
No acute bony abnormality is noted.
IMPRESSION: No acute abnormality noted.

Slight enlargement of fat containing ventral hernia.

## 2019-12-31 ENCOUNTER — Other Ambulatory Visit: Payer: Self-pay

## 2019-12-31 ENCOUNTER — Encounter: Payer: Self-pay | Admitting: Cardiovascular Disease

## 2019-12-31 ENCOUNTER — Ambulatory Visit: Payer: Medicare Other | Admitting: Cardiovascular Disease

## 2019-12-31 VITALS — BP 144/90 | HR 85 | Ht 63.4 in | Wt 201.8 lb

## 2019-12-31 DIAGNOSIS — I10 Essential (primary) hypertension: Secondary | ICD-10-CM

## 2019-12-31 DIAGNOSIS — E785 Hyperlipidemia, unspecified: Secondary | ICD-10-CM | POA: Diagnosis not present

## 2019-12-31 DIAGNOSIS — Z5181 Encounter for therapeutic drug level monitoring: Secondary | ICD-10-CM

## 2019-12-31 MED ORDER — SPIRONOLACTONE 25 MG PO TABS: 25.0000 mg | ORAL_TABLET | Freq: Every day | ORAL | 3 refills | Status: DC

## 2019-12-31 MED ORDER — HYDROCHLOROTHIAZIDE 25 MG PO TABS: ORAL_TABLET | ORAL | 3 refills | Status: DC

## 2019-12-31 NOTE — Progress Notes (Signed)
Cardiology Office Note   Date:  01/08/2020   ID:  Deborah, [name] 1951/08/15, MRN [id number]  PCP:  Deborah Brine, FNP  Cardiologist:   Skeet Latch, MD   No chief complaint on file.     History of Present Illness: Deborah [name] is a 69 y.o. female with hypertension, hypothyroidism, and diabetes who is being seen today for the evaluation of hypertension at the request of Deborah [name], Deborah [name].  Deborah [name] reports having high blood pressure for years.  When her daughter was born she had a hypertensive crisis and struggled with BP since that time.  It has never been very well-controlled.    She saw Deborah Brine, FNP, on 10/28 and her BP was 170/98.  She has a history of intolerance to many medications so she was referred to cardiology for further management.  She reports having been on HCTZ for many years.   Lately her BP has been running in the 140s/80s.  She notices that it is worse when she is feeling stressed.  Prior to COVID-19 she was exercising regularly.  However the gym has been closed.  She doesn't think her BP was any better at that time.  She drinks two cups of coffee daily and limits pork to once per week.  She mostly cooks at home and limits salt intake.  She reports that her glucose has been well-controlled.  She has no chest pain, shortness of breath, orthopnea or PND.  She does note nocturia.  She was recently started on rosuvastatin once weekly.  She denies a history of myalgias.   Intolerance to antihypertensives: HCTZ for years BidIl- head hurt Valsartan- cough Norvasc- migraines Metoprolol Lisinopril   Past Medical History:  Diagnosis Date  . Death of child 4   Due to cord strangulation.  . Diabetes mellitus without complication ([place])   . Hypertension     Past Surgical History:  Procedure Laterality Date  . APPENDECTOMY  1974  . CARDIAC CATHETERIZATION     two, dates not provided.  . CHOLECYSTECTOMY     confirm date with  patient, was it 14?  Marland Kitchen LAPAROSCOPIC TUBAL LIGATION  1987     Current Outpatient Medications  Medication Sig Dispense Refill  . Blood Glucose Monitoring Suppl (ACURA BLOOD GLUCOSE METER) w/Device KIT Check blood sugars twice daily. Please provide patient with meter and supplies that is covered by pt ins. 1 kit 0  . cholecalciferol (VITAMIN D) 1000 units tablet Take 2,000 Units by mouth daily.    . cyclobenzaprine (FLEXERIL) 10 MG tablet Take 1 tablet (10 mg total) by mouth 3 (three) times daily as needed for muscle spasms. 20 tablet 0  . docusate sodium (COLACE) 100 MG capsule Take 100 mg by mouth daily.    Marland Kitchen glimepiride (AMARYL) 2 MG tablet TAKE ONE TABLET (2MG) BY MOUTH EVERY DAY 90 tablet 1  . hydrALAZINE (APRESOLINE) 50 MG tablet TAKE 1 TABLET BY MOUTH TWICE A DAY 180 tablet 1  . hydrochlorothiazide (HYDRODIURIL) 25 MG tablet Take 1 tablet by mouth daily 90 tablet 3  . HYDROcodone-homatropine (HYDROMET) 5-1.5 MG/5ML syrup Take 5 mLs by mouth every 6 (six) hours as needed for cough. 120 mL 0  . levothyroxine (SYNTHROID) 100 MCG tablet TAKE 1 TABLET BY MOUTH DAILY BEFORE BREAKFAST. 90 tablet 1  . Magnesium 250 MG TABS Take 1 tablet by mouth daily.    Deborah [name] ULTRA test strip USE WITH METER TO CHECK  BLOOD SUGAR TWO TIMES  DAILY, BEFORE BREAKFAST AND BEFORE DINNER 200 strip 3  . rosuvastatin (CRESTOR) 5 MG tablet TAKE 1 TABLET BY MOUTH EVERYDAY AT BEDTIME 90 tablet 1  . spironolactone (ALDACTONE) 25 MG tablet Take 1 tablet (25 mg total) by mouth daily. 90 tablet 3   No current facility-administered medications for this visit.    Allergies:   Amlodipine, Aspartame and phenylalanine, Bidil [isosorb dinitrate-hydralazine], Erythromycin, Ibuprofen, Phenylalanine, Prilosec [omeprazole], Tramadol, Valsartan, and Zantac [ranitidine hcl]    Social History:  The patient  reports that she has never smoked. She has never used smokeless tobacco. She reports previous alcohol use. She reports that  she does not use drugs.   Family History:  The patient's family history includes COPD in her mother; Diabetes in her mother; Glaucoma in her father; Heart disease in her father; Hypertension in her father and mother.    ROS:  Please see the history of present illness.   Otherwise, review of systems are positive for none.   All other systems are reviewed and negative.    PHYSICAL EXAM: VS:  BP (!) 144/90   Pulse 85   Ht 5' 3.4" (1.61 m)   Wt 201 lb 12.8 oz (91.5 kg)   BMI 35.30 kg/m  , BMI Body mass index is 35.3 kg/m. GENERAL:  Well appearing HEENT:  Pupils equal round and reactive, fundi not visualized, oral mucosa unremarkable NECK:  No jugular venous distention, waveform within normal limits, carotid upstroke brisk and symmetric, no bruits, no thyromegaly LYMPHATICS:  No cervical adenopathy LUNGS:  Clear to auscultation bilaterally HEART:  RRR.  PMI not displaced or sustained,S1 and S2 within normal limits, no S3, no S4, no clicks, no rubs, no murmurs ABD:  Flat, positive bowel sounds normal in frequency in pitch, no bruits, no rebound, no guarding, no midline pulsatile mass, no hepatomegaly, no splenomegaly EXT:  2 plus pulses throughout, no edema, no cyanosis no clubbing SKIN:  No rashes no nodules NEURO:  Cranial nerves II through XII grossly intact, motor grossly intact throughout PSYCH:  Cognitively intact, oriented to person place and time    EKG:  EKG is ordered today. The ekg ordered today demonstrates sinus rhythm.  Rate 84 bpm.     Recent Labs: 10/17/2019: ALT 16; BUN 13; Creatinine, Ser 0.53; Hemoglobin 11.9; Platelets 395; Potassium 4.1; Sodium 139; TSH 1.420    Lipid Panel    Component Value Date/Time   CHOL 206 (H) 01/11/2019 0930   TRIG 63 01/11/2019 0930   HDL 55 01/11/2019 0930   CHOLHDL 3.7 01/11/2019 0930   CHOLHDL 5.3 06/25/2007 0535   VLDL 12 06/25/2007 0535   LDLCALC 138 (H) 01/11/2019 0930      Wt Readings from Last 3 Encounters:  12/31/19  201 lb 12.8 oz (91.5 kg)  10/17/19 206 lb (93.4 kg)  10/17/19 206 lb 6.4 oz (93.6 kg)      ASSESSMENT AND PLAN:  # Hypertension:  Blood pressure is poorly controlled.  She has been intolerant of many antihypertensives.  We will reduced HCTZ to 10m and add spironolactone 221mdaily.  Continue hydralazine. Check CMP in a week.  We discussed increasing hydralazine to tid and she would prefer not to.    # Hyperliidemia: She recently started rosuvastatin once per week.  No prior statin intolerance.  I doubt this will get her LDL to <70.  Check lipids/CMP in 1 week.  Consider increasing based on those results.    Current medicines are reviewed at length with  the patient today.  The patient does not have concerns regarding medicines.  The following changes have been made:  Reduce HCTZ.  Start spironolactone.  Labs/ tests ordered today include:   Orders Placed This Encounter  Procedures  . Lipid Profile  . Comprehensive Metabolic Panel (CMET)  . EKG 12-Lead     Disposition:   FU with Deborah Quebedeaux C. Oval Linsey, MD, [facility] in 4-6 weeks.      Signed, Deborah Kunesh C. Oval Linsey, MD, [facility]  01/08/2020 10:09 AM    [place]

## 2019-12-31 NOTE — Patient Instructions (Signed)
Medication Instructions:  DECREASE YOUR HYDROCHLOROTHIAZIDE TO 25 MG DAILY   START SPIRONOLACTONE 25 MG DAILY   *If you need a refill on your cardiac medications before your next appointment, please call your pharmacy*  Lab Work: FASTING LP/CMET IN 1 WEEK   If you have labs (blood work) drawn today and your tests are completely normal, you will receive your results only by: Marland Kitchen MyChart Message (if you have MyChart) OR . A paper copy in the mail If you have any lab test that is abnormal or we need to change your treatment, we will call you to review the results.  Testing/Procedures: NONE  Follow-Up: At [facility], you and your health needs are our priority.  As part of our continuing mission to provide you with exceptional heart care, we have created designated Provider Care Teams.  These Care Teams include your primary Cardiologist (physician) and Advanced Practice Providers (APPs -  Physician Assistants and Nurse Practitioners) who all work together to provide you with the care you need, when you need it.  Your next appointment: 4-6 WEEKS   The format for your next appointment:   In Person  Provider:   You may see DR [facility] or one of the following Advanced Practice Providers on your designated Care Team:    Kerin Ransom, PA-C  [place], [place]  Coletta Memos, [place]

## 2020-01-08 ENCOUNTER — Encounter: Payer: Self-pay | Admitting: Cardiovascular Disease

## 2020-01-08 DIAGNOSIS — Z5181 Encounter for therapeutic drug level monitoring: Secondary | ICD-10-CM | POA: Diagnosis not present

## 2020-01-08 DIAGNOSIS — E785 Hyperlipidemia, unspecified: Secondary | ICD-10-CM | POA: Diagnosis not present

## 2020-01-08 DIAGNOSIS — I10 Essential (primary) hypertension: Secondary | ICD-10-CM | POA: Diagnosis not present

## 2020-01-09 LAB — LIPID PANEL
Chol/HDL Ratio: 3.3 ratio (ref 0.0–4.4)
Cholesterol, Total: 174 mg/dL (ref 100–199)
HDL: 53 mg/dL (ref >39)
LDL Chol Calc (NIH): 107 mg/dL — ABNORMAL HIGH (ref 0–99)
Triglycerides: 76 mg/dL (ref 0–149)
VLDL Cholesterol Cal: 14 mg/dL (ref 5–40)

## 2020-01-09 LAB — COMPREHENSIVE METABOLIC PANEL
ALT: 16 IU/L (ref 0–32)
AST: 16 IU/L (ref 0–40)
Albumin/Globulin Ratio: 1.7 (ref 1.2–2.2)
Albumin: 4.5 g/dL (ref 3.8–4.8)
Alkaline Phosphatase: 99 IU/L (ref 39–117)
BUN/Creatinine Ratio: 28 (ref 12–28)
BUN: 18 mg/dL (ref 8–27)
Bilirubin Total: 0.6 mg/dL (ref 0.0–1.2)
CO2: 26 mmol/L (ref 20–29)
Calcium: 9.8 mg/dL (ref 8.7–10.3)
Chloride: 100 mmol/L (ref 96–106)
Creatinine, Ser: 0.64 mg/dL (ref 0.57–1.00)
GFR calc Af Amer: 107 mL/min/{1.73_m2} (ref >59)
GFR calc non Af Amer: 93 mL/min/{1.73_m2} (ref >59)
Globulin, Total: 2.7 g/dL (ref 1.5–4.5)
Glucose: 81 mg/dL (ref 65–99)
Potassium: 4.6 mmol/L (ref 3.5–5.2)
Sodium: 139 mmol/L (ref 134–144)
Total Protein: 7.2 g/dL (ref 6.0–8.5)

## 2020-01-17 ENCOUNTER — Ambulatory Visit (INDEPENDENT_AMBULATORY_CARE_PROVIDER_SITE_OTHER): Payer: Medicare Other | Admitting: Nurse Practitioner

## 2020-01-17 ENCOUNTER — Encounter: Payer: Self-pay | Admitting: Nurse Practitioner

## 2020-01-17 ENCOUNTER — Other Ambulatory Visit: Payer: Self-pay

## 2020-01-17 VITALS — BP 118/80 | HR 85 | Temp 98.5°F | Ht 62.8 in | Wt 201.2 lb

## 2020-01-17 DIAGNOSIS — I10 Essential (primary) hypertension: Secondary | ICD-10-CM

## 2020-01-17 DIAGNOSIS — R059 Cough, unspecified: Secondary | ICD-10-CM

## 2020-01-17 DIAGNOSIS — R7303 Prediabetes: Secondary | ICD-10-CM

## 2020-01-17 DIAGNOSIS — R5383 Other fatigue: Secondary | ICD-10-CM

## 2020-01-17 DIAGNOSIS — M25561 Pain in right knee: Secondary | ICD-10-CM

## 2020-01-17 DIAGNOSIS — E039 Hypothyroidism, unspecified: Secondary | ICD-10-CM | POA: Diagnosis not present

## 2020-01-17 DIAGNOSIS — G4700 Insomnia, unspecified: Secondary | ICD-10-CM

## 2020-01-17 DIAGNOSIS — M25562 Pain in left knee: Secondary | ICD-10-CM

## 2020-01-17 DIAGNOSIS — G8929 Other chronic pain: Secondary | ICD-10-CM

## 2020-01-17 DIAGNOSIS — R05 Cough: Secondary | ICD-10-CM

## 2020-01-17 MED ORDER — HYDROCODONE-HOMATROPINE 5-1.5 MG/5ML PO SYRP: 5.0000 mL | ORAL_SOLUTION | Freq: Four times a day (QID) | ORAL | 0 refills | Status: DC | PRN

## 2020-01-17 MED ORDER — ROSUVASTATIN CALCIUM 5 MG PO TABS: ORAL_TABLET | ORAL | 1 refills | Status: DC

## 2020-01-17 NOTE — Progress Notes (Signed)
Subjective:     Patient ID: Deborah [name] , female    DOB: January 13, 1951 , 69 y.o.   MRN: [id number]   Chief Complaint  Patient presents with  . Hypertension    HPI  She has seen Dr. Oval Linsey who started her on spironolactone.  She suggested the hydrazaline three times a day but started the spironolactone.    Insomnia Primary symptoms: no sleep disturbance.  The current episode started more than one month. The onset quality is gradual. The problem is unchanged. The symptoms are relieved by activity. PMH includes: associated symptoms present.  Diabetes She presents for her follow-up diabetic visit. She has type 2 diabetes mellitus. Her disease course has been stable. There are no hypoglycemic associated symptoms. There are no diabetic associated symptoms. Pertinent negatives for diabetes include no blurred vision and no chest pain. There are no hypoglycemic complications. Symptoms are stable. There are no diabetic complications. Risk factors for coronary artery disease include diabetes mellitus, obesity and sedentary lifestyle. She is compliant with treatment most of the time. She is following a generally unhealthy diet. When asked about meal planning, she reported none. There is no change in her home blood glucose trend. (Blood sugars are 130's since being back to work and eating more possibly.) An ACE inhibitor/angiotensin II receptor blocker is being taken. She does not see a podiatrist.Eye exam is not current.  Hypertension This is a chronic problem. The current episode started more than 1 year ago. The problem is controlled. Pertinent negatives include no blurred vision, chest pain or palpitations. Risk factors for coronary artery disease include diabetes mellitus, obesity and sedentary lifestyle. Past treatments include diuretics. There are no compliance problems.  There is no history of angina. There is no history of chronic renal disease.     Past Medical History:  Diagnosis Date   . Death of child 65   Due to cord strangulation.  . Diabetes mellitus without complication ([place])   . Hypertension      Family History  Problem Relation Age of Onset  . COPD Mother   . Diabetes Mother   . Hypertension Mother   . Heart disease Father   . Hypertension Father   . Glaucoma Father      Current Outpatient Medications:  .  Blood Glucose Monitoring Suppl (ACURA BLOOD GLUCOSE METER) w/Device KIT, Check blood sugars twice daily. Please provide patient with meter and supplies that is covered by pt ins., Disp: 1 kit, Rfl: 0 .  cholecalciferol (VITAMIN D) 1000 units tablet, Take 2,000 Units by mouth daily., Disp: , Rfl:  .  cyclobenzaprine (FLEXERIL) 10 MG tablet, Take 1 tablet (10 mg total) by mouth 3 (three) times daily as needed for muscle spasms., Disp: 20 tablet, Rfl: 0 .  docusate sodium (COLACE) 100 MG capsule, Take 100 mg by mouth daily., Disp: , Rfl:  .  glimepiride (AMARYL) 2 MG tablet, TAKE ONE TABLET (2MG) BY MOUTH EVERY DAY, Disp: 90 tablet, Rfl: 1 .  hydrALAZINE (APRESOLINE) 50 MG tablet, TAKE 1 TABLET BY MOUTH TWICE A DAY, Disp: 180 tablet, Rfl: 1 .  hydrochlorothiazide (HYDRODIURIL) 25 MG tablet, Take 1 tablet by mouth daily, Disp: 90 tablet, Rfl: 3 .  HYDROcodone-homatropine (HYDROMET) 5-1.5 MG/5ML syrup, Take 5 mLs by mouth every 6 (six) hours as needed for cough., Disp: 120 mL, Rfl: 0 .  levothyroxine (SYNTHROID) 100 MCG tablet, TAKE 1 TABLET BY MOUTH DAILY BEFORE BREAKFAST., Disp: 90 tablet, Rfl: 1 .  Magnesium 250 MG TABS,  Take 1 tablet by mouth daily., Disp: , Rfl:  .  ONETOUCH ULTRA test strip, USE WITH METER TO CHECK  BLOOD SUGAR TWO TIMES  DAILY, BEFORE BREAKFAST AND BEFORE DINNER, Disp: 200 strip, Rfl: 3 .  rosuvastatin (CRESTOR) 5 MG tablet, TAKE 1 TABLET BY MOUTH EVERYDAY AT BEDTIME, Disp: 90 tablet, Rfl: 1 .  spironolactone (ALDACTONE) 25 MG tablet, Take 1 tablet (25 mg total) by mouth daily., Disp: 90 tablet, Rfl: 3   Allergies  Allergen Reactions   . Amlodipine     headache  . Aspartame And Phenylalanine Other (See Comments)    Causes migraines  . Bidil [Isosorb Dinitrate-Hydralazine]     headache  . Erythromycin     Ask patient to specify type/severity/reaction. Not indicated on medical history form dated 11/04/10.  . Ibuprofen     Ask patient to specify type/severity/reaction. Not noted on medical history form dated 11/04/10.  Marland Kitchen Phenylalanine Other (See Comments)    Causes migraine  . Prilosec [Omeprazole] Nausea And Vomiting  . Tramadol Nausea And Vomiting  . Valsartan Cough  . Zantac [Ranitidine Hcl]      Review of Systems  Constitutional: Negative.   Eyes: Negative for blurred vision.  Respiratory: Positive for cough (chronic).   Cardiovascular: Negative.  Negative for chest pain, palpitations and leg swelling.  Musculoskeletal: Positive for arthralgias and back pain (upper back thoracic area).  Psychiatric/Behavioral: Negative for sleep disturbance. The patient has insomnia.      Today's Vitals   01/17/20 0844  BP: 118/80  Pulse: 85  Temp: 98.5 F (36.9 C)  TempSrc: Oral  Weight: 201 lb 3.2 oz (91.3 kg)  Height: 5' 2.8" (1.595 m)  PainSc: 0-No pain   Body mass index is 35.87 kg/m.   Objective:  Physical Exam Constitutional:      General: She is not in acute distress.    Appearance: Normal appearance. She is obese.  Cardiovascular:     Rate and Rhythm: Normal rate and regular rhythm.     Pulses: Normal pulses.     Heart sounds: Normal heart sounds. No murmur.  Pulmonary:     Effort: Pulmonary effort is normal. No respiratory distress.     Breath sounds: Normal breath sounds.  Musculoskeletal:        General: Tenderness (bilateral posterior thoracic back area) present. No swelling.     Right lower leg: No edema.     Left lower leg: No edema.  Skin:    General: Skin is warm and dry.     Capillary Refill: Capillary refill takes less than 2 seconds.     Coloration: Skin is not jaundiced.   Neurological:     General: No focal deficit present.     Mental Status: She is alert and oriented to person, place, and time.  Psychiatric:        Mood and Affect: Mood normal.        Thought Content: Thought content normal.        Judgment: Judgment normal.         Assessment And Plan:     1. Essential hypertension Her blood pressure is well controlled with spironolactone given by Dr. Oval Linsey She had BMP done on 1/18 with normal results  2. Prediabetes  Chronic, better controlled and tolerating amaryl  3. Hypothyroidism, unspecified type  Chronic, normal results at last visit  Continue with current dose will check thyroid levels in 3 months  4. Insomnia, unspecified type  Chronic, no changes  today  5. Chronic pain of both knees  Continue with follow up with Dr. Marlou Sa  6. Cough  Intermittent cough that can become uncontrollable, the only medication that has been effective is hydromet  Reviewed PDMP, no new Rxs in last 2 months - HYDROcodone-homatropine (HYDROMET) 5-1.5 MG/5ML syrup; Take 5 mLs by mouth every 6 (six) hours as needed for cough.  Dispense: 120 mL; Refill: 0  7. Fatigue, unspecified type  This is ongoing I feel is related to poor sleep however she has not been for a sleep study due to her husband unable to be home alone at night     Minette Brine, [place] DISTANCING DUE TO THE COVID-19 PANDEMIC.

## 2020-01-24 ENCOUNTER — Encounter: Payer: Self-pay | Admitting: Nurse Practitioner

## 2020-01-31 ENCOUNTER — Encounter: Payer: Self-pay | Admitting: Orthopedic Surgery

## 2020-01-31 ENCOUNTER — Other Ambulatory Visit: Payer: Self-pay

## 2020-01-31 ENCOUNTER — Ambulatory Visit: Payer: Medicare Other | Admitting: Cardiovascular Disease

## 2020-01-31 ENCOUNTER — Encounter: Payer: Self-pay | Admitting: Cardiovascular Disease

## 2020-01-31 VITALS — BP 138/90 | HR 82 | Temp 96.7°F | Ht 64.0 in | Wt 203.4 lb

## 2020-01-31 DIAGNOSIS — I10 Essential (primary) hypertension: Secondary | ICD-10-CM | POA: Diagnosis not present

## 2020-01-31 DIAGNOSIS — R0602 Shortness of breath: Secondary | ICD-10-CM | POA: Diagnosis not present

## 2020-01-31 MED ORDER — HYDRALAZINE HCL 100 MG PO TABS: 100.0000 mg | ORAL_TABLET | Freq: Two times a day (BID) | ORAL | 1 refills | Status: DC

## 2020-01-31 NOTE — Telephone Encounter (Signed)
Okay for bilateral cortisone shots Pls call thx

## 2020-01-31 NOTE — Progress Notes (Signed)
Cardiology Office Note   Date:  01/31/2020   ID:  Deborah, [name] 1951/02/16, MRN [id number]  PCP:  Minette Brine, FNP  Cardiologist:   Skeet Latch, MD   No chief complaint on file.    History of Present Illness: Deborah [name] is a 69 y.o. female with hypertension, hypothyroidism, and diabetes here for follow-up.  She was initially seen 12/2019 for hypertension.  Deborah [name] reports having high blood pressure for years.  When her daughter was born she had a hypertensive crisis and struggled with BP since that time.  It has never been very well-controlled.    She saw Minette Brine, FNP, on 09/2019 and her BP was 170/98.  She has a history of intolerance to many medications so she was referred to cardiology for further management.  She reports having been on HCTZ for many years.   She notices that it is worse when she is feeling stressed.  Prior to COVID-19 she was exercising regularly.  At her last appointment Deborah [name] blood pressure was poorly controlled.  Hydrochlorothiazide was reduced and she was started on spironolactone.  Rosuvastatin was increased to daily.  Lately her BP has been running in Deborah 140s/90s.  She thinks it is more elevated today because she already had two cups of coffee.  She has been working at [employer] which is kept her busy.  She is not getting much exercise.  She has gotten 2 doses of Deborah Covid vaccine.  She has not had any chest pain or shortness of breath.  She denies edema, orthopnea, or PND.  She checks her blood pressure regularly.   Intolerance to antihypertensives: HCTZ for years BidIl- head hurt Valsartan- cough Norvasc- migraines Metoprolol Lisinopril   Past Medical History:  Diagnosis Date  . Death of child 25   Due to cord strangulation.  . Diabetes mellitus without complication ([place])   . Hypertension     Past Surgical History:  Procedure Laterality Date  . APPENDECTOMY  1974  . CARDIAC  CATHETERIZATION     two, dates not provided.  . CHOLECYSTECTOMY     confirm date with [name], was it 21?  [name] Kitchen LAPAROSCOPIC TUBAL LIGATION  1987     Current Outpatient Medications  Medication Sig Dispense Refill  . Blood Glucose Monitoring Suppl (ACURA BLOOD GLUCOSE METER) w/Device KIT Check blood sugars twice daily. Please provide [name] with meter and supplies that is covered by pt ins. 1 kit 0  . cholecalciferol (VITAMIN D) 1000 units tablet Take 2,000 Units by mouth daily.    . cyclobenzaprine (FLEXERIL) 10 MG tablet Take 1 tablet (10 mg total) by mouth 3 (three) times daily as needed for muscle spasms. 20 tablet 0  . docusate sodium (COLACE) 100 MG capsule Take 100 mg by mouth daily.    . hydrALAZINE (APRESOLINE) 100 MG tablet Take 1 tablet (100 mg total) by mouth 2 (two) times daily. 180 tablet 1  . hydrochlorothiazide (HYDRODIURIL) 25 MG tablet Take 1 tablet by mouth daily 90 tablet 3  . HYDROcodone-homatropine (HYDROMET) 5-1.5 MG/5ML syrup Take 5 mLs by mouth every 6 (six) hours as needed for cough. 120 mL 0  . levothyroxine (SYNTHROID) 100 MCG tablet TAKE 1 TABLET BY MOUTH DAILY BEFORE BREAKFAST. 90 tablet 1  . Magnesium 250 MG TABS Take 1 tablet by mouth daily.    Glory Rosebush ULTRA test strip USE WITH METER TO CHECK  BLOOD SUGAR TWO TIMES  DAILY, BEFORE BREAKFAST AND  BEFORE DINNER 200 strip 3  . rosuvastatin (CRESTOR) 5 MG tablet Take 1 tablet by mouth daily 30 tablet 1  . spironolactone (ALDACTONE) 25 MG tablet Take 1 tablet (25 mg total) by mouth daily. 90 tablet 3  . glimepiride (AMARYL) 2 MG tablet TAKE ONE TABLET (2MG) BY MOUTH EVERY DAY 90 tablet 1   No current facility-administered medications for this visit.    Allergies:   Amlodipine, Aspartame and phenylalanine, Bidil [isosorb dinitrate-hydralazine], Erythromycin, Ibuprofen, Phenylalanine, Prilosec [omeprazole], Tramadol, Valsartan, and Zantac [ranitidine hcl]    Social History:  Deborah [name]  reports that she has  never smoked. She has never used smokeless tobacco. She reports previous alcohol use. She reports that she does not use drugs.   Family History:  Deborah [name]'s family history includes COPD in her mother; Diabetes in her mother; Glaucoma in her father; Heart disease in her father; Hypertension in her father and mother.    ROS:  Please see Deborah history of present illness.   Otherwise, review of systems are positive for none.   All other systems are reviewed and negative.    PHYSICAL EXAM: VS:  BP 138/90   Pulse 82   Temp (!) 96.7 F (35.9 C)   Ht '5\' 4"'  (1.626 m)   Wt 203 lb 6.4 oz (92.3 kg)   SpO2 99%   BMI 34.91 kg/m  , BMI Body mass index is 34.91 kg/m. GENERAL:  Well appearing HEENT:  Pupils equal round and reactive, fundi not visualized, oral mucosa unremarkable NECK:  No jugular venous distention, waveform within normal limits, carotid upstroke brisk and symmetric, no bruits LUNGS:  Clear to auscultation bilaterally HEART:  RRR.  PMI not displaced or sustained,S1 and S2 within normal limits, no S3, no S4, no clicks, no rubs, no murmurs ABD:  Flat, positive bowel sounds normal in frequency in pitch, no bruits, no rebound, no guarding, no midline pulsatile mass, no hepatomegaly, no splenomegaly EXT:  2 plus pulses throughout, no edema, no cyanosis no clubbing SKIN:  No rashes no nodules NEURO:  Cranial nerves II through XII grossly intact, motor grossly intact throughout PSYCH:  Cognitively intact, oriented to person place and time   EKG:  EKG is not ordered today. Deborah ekg ordered today demonstrates sinus rhythm.  Rate 84 bpm.     Recent Labs: 10/17/2019: Hemoglobin 11.9; Platelets 395; TSH 1.420 01/08/2020: ALT 16; BUN 18; Creatinine, Ser 0.64; Potassium 4.6; Sodium 139    Lipid Panel    Component Value Date/Time   CHOL 174 01/08/2020 0926   TRIG 76 01/08/2020 0926   HDL 53 01/08/2020 0926   CHOLHDL 3.3 01/08/2020 0926   CHOLHDL 5.3 06/25/2007 0535   VLDL 12 06/25/2007  0535   LDLCALC 107 (H) 01/08/2020 0926      Wt Readings from Last 3 Encounters:  01/31/20 203 lb 6.4 oz (92.3 kg)  01/17/20 201 lb 3.2 oz (91.3 kg)  12/31/19 201 lb 12.8 oz (91.5 kg)      ASSESSMENT AND PLAN:  # Hypertension:  Blood pressure remains poorly controlled but is better.  She is going to increase hydralazine to 100 mg twice daily.  If it continues to be elevated in a week or 2 she can increase to 100 mg 3 times daily.  Continue hydrochlorothiazide and spironolactone.  # Hyperliidemia: Rosuvastatin was increased at her last visit.  We will plan to repeat lipids and CMP at follow-up.   Current medicines are reviewed at length with Deborah [name]  today.  Deborah [name] does not have concerns regarding medicines.  Deborah following changes have been made: Increase hydralazine  Labs/ tests ordered today include:   No orders of Deborah defined types were placed in this encounter.    Disposition:   FU with Deborah [name] C. Oval Linsey, MD, [facility] in 3 months    Signed, Donshay Lupinski C. Oval Linsey, MD, [facility]  01/31/2020 12:37 PM    [place]

## 2020-01-31 NOTE — Patient Instructions (Addendum)
Medication Instructions:  INCREASE YOUR HYDRALAZINE TO 100 MG TWICE A DAY.  IF YOUR BLOOD PRESSURE REMAINS ABOVE 130/80 AFTER 2 WEEKS INCREASE TO THREE TIMES A DAY   *If you need a refill on your cardiac medications before your next appointment, please call your pharmacy*  Lab Work: NONE   Testing/Procedures: NONE   Follow-Up: At [facility], you and your health needs are our priority.  As part of our continuing mission to provide you with exceptional heart care, we have created designated Provider Care Teams.  These Care Teams include your primary Cardiologist (physician) and Advanced Practice Providers (APPs -  Physician Assistants and Nurse Practitioners) who all work together to provide you with the care you need, when you need it.  Your next appointment:   3 month(s)  The format for your next appointment:   In Person  Provider:   You may see DR [facility] or one of the following Advanced Practice Providers on your designated Care Team:    Kerin Ransom, PA-C  [place], [place]  Coletta Memos, [place]

## 2020-02-06 ENCOUNTER — Ambulatory Visit: Payer: Medicare Other | Admitting: Orthopedic Surgery

## 2020-02-06 ENCOUNTER — Other Ambulatory Visit: Payer: Self-pay

## 2020-02-06 DIAGNOSIS — M170 Bilateral primary osteoarthritis of knee: Secondary | ICD-10-CM | POA: Diagnosis not present

## 2020-02-08 NOTE — Progress Notes (Signed)
Office Visit Note   Patient: Deborah [name]           Date of Birth: 21-Jul-1951           MRN: [id number] Visit Date: 02/06/2020 Requested by: Minette Brine, [place] [place],  [place] [address] PCP: Minette Brine, FNP  Subjective: Chief Complaint  Patient presents with  . Left Knee - Pain  . Right Knee - Pain    HPI: Deborah [name] is a 69 y.o. female who presents to the office complaining of bilateral knee pain.  She has known history of bilateral knee arthritis.  Her last office visit was on 11/02/2019 where she received bilateral knee gel injections that provided no relief.  Today she complains of bilateral knee pain that she rates a 9 out of 10 and reports that this pain is constant.  She notes diffuse pain throughout the bilateral knees and is tearful when discussing her symptoms.  She notes occasional locking of the left knee but nothing consistent or long-lasting.  She has difficulty weightbearing for long periods of time due to pain.  She has been using Aspercreme and blue emu with little relief.  She was taking Aleve but has recently stopped due to occult blood in her stool.  She states that she has made an appointment to see a gastroenterologist.  She does have a history of diabetes and her last A1c was between 5.9 and 6.1 according to her.  She notes occasional groin pain and low back pain "all the time".  She denies any radicular symptoms but does note occasional numbness and tingling of the legs about once a week.  She requests bilateral knee cortisone injections.  The last time she had bilateral cortisone injections was in September 2020 and notes that this provided relief for about 6 weeks.  She wants to avoid surgery at this time..                ROS:  All systems reviewed are negative as they relate to the chief complaint within the history of present illness.  Patient denies fevers or chills.  Assessment & Plan: Visit Diagnoses:  1.  Primary osteoarthritis of both knees     Plan: Patient is a 69 year old female who presents complaining of bilateral knee pain.  She has known history of bilateral knee osteoarthritis.  She has had gel injections previously that provided no relief but she has had cortisone injections that provided about 6 weeks of relief.  She has difficulty weightbearing due to pain and this pain is a major stress in her life evidently as she is quite upset when discussing her symptoms.  She wants to hold off on surgery for now and request bilateral knee cortisone injections today.  Patient tolerated the procedure well and will follow up with the office as needed.  We can do these injections every ~4 months.  Follow-Up Instructions: No follow-ups on file.   Orders:  No orders of the defined types were placed in this encounter.  No orders of the defined types were placed in this encounter.     Procedures: Large Joint Inj: bilateral knee on 02/14/2020 8:17 PM Indications: diagnostic evaluation, joint swelling and pain Details: 18 G 1.5 in needle, superolateral approach  Arthrogram: No  Medications (Right): 5 mL lidocaine 1 %; 4 mL bupivacaine 0.25 %; 30 mg methylPREDNISolone acetate 40 MG/ML Medications (Left): 5 mL lidocaine 1 %; 4 mL bupivacaine 0.25 %; 30 mg methylPREDNISolone  acetate 40 MG/ML Outcome: tolerated well, no immediate complications Procedure, treatment alternatives, risks and benefits explained, specific risks discussed. Consent was given by the patient. Immediately prior to procedure a time out was called to verify the correct patient, procedure, equipment, support staff and site/side marked as required. Patient was prepped and draped in the usual sterile fashion.       Clinical Data: No additional findings.  Objective: Vital Signs: There were no vitals taken for this visit.  Physical Exam:  Constitutional: Patient appears well-developed HEENT:  Head: Normocephalic Eyes:EOM are  normal Neck: Normal range of motion Cardiovascular: Normal rate Pulmonary/chest: Effort normal Neurologic: Patient is alert Skin: Skin is warm Psychiatric: Patient has normal mood and affect  Ortho Exam:  Bilateral knee Exam No effusion Extensor mechanism intact Diffuse tenderness to palpation throughout bilateral knees with most focal area of tenderness over the medial and lateral joint lines. Stable to varus/valgus stresses.  Stable to anterior/posterior drawer Extension to 0 degrees Flexion > 90 degrees  Specialty Comments:  No specialty comments available.  Imaging: No results found.   PMFS History: Patient Active Problem List   Diagnosis Date Noted  . Insomnia 04/30/2019  . Seasonal allergies 03/27/2019  . Prediabetes 01/11/2019  . Hypothyroidism 01/11/2019  . Vitamin D deficiency 01/11/2019  . Cough 01/11/2019  . Fatigue 01/11/2019  . Type 2 diabetes mellitus ([place]) 09/16/2018  . High blood pressure 06/08/2011  . SOB (shortness of breath) 06/08/2011  . Generalized headaches 06/08/2011  . Weight loss 06/08/2011  . Menopause 06/08/2011   Past Medical History:  Diagnosis Date  . Death of child 54   Due to cord strangulation.  . Diabetes mellitus without complication ([place])   . Hypertension     Family History  Problem Relation Age of Onset  . COPD Mother   . Diabetes Mother   . Hypertension Mother   . Heart disease Father   . Hypertension Father   . Glaucoma Father     Past Surgical History:  Procedure Laterality Date  . APPENDECTOMY  1974  . CARDIAC CATHETERIZATION     two, dates not provided.  . CHOLECYSTECTOMY     confirm date with patient, was it 23?  Marland Kitchen LAPAROSCOPIC TUBAL LIGATION  1987   Social History   Occupational History  . Occupation: retired  Tobacco Use  . Smoking status: Never Smoker  . Smokeless tobacco: Never Used  Substance and Sexual Activity  . Alcohol use: Not Currently  . Drug use: No  . Sexual activity: Not Currently

## 2020-02-14 ENCOUNTER — Encounter: Payer: Self-pay | Admitting: Orthopedic Surgery

## 2020-02-14 DIAGNOSIS — M170 Bilateral primary osteoarthritis of knee: Secondary | ICD-10-CM | POA: Diagnosis not present

## 2020-02-14 MED ORDER — BUPIVACAINE HCL 0.25 % IJ SOLN
4.0000 mL | INTRAMUSCULAR | Status: AC | PRN
  Administered 2020-02-14: 4 mL via INTRA_ARTICULAR

## 2020-02-14 MED ORDER — METHYLPREDNISOLONE ACETATE 40 MG/ML IJ SUSP
30.0000 mg | INTRAMUSCULAR | Status: AC | PRN
  Administered 2020-02-14: 30 mg via INTRA_ARTICULAR

## 2020-02-14 MED ORDER — LIDOCAINE HCL 1 % IJ SOLN
5.0000 mL | INTRAMUSCULAR | Status: AC | PRN
  Administered 2020-02-14: 20:00:00 5 mL

## 2020-02-14 MED ORDER — LIDOCAINE HCL 1 % IJ SOLN
5.0000 mL | INTRAMUSCULAR | Status: AC | PRN
  Administered 2020-02-14: 5 mL

## 2020-02-19 DIAGNOSIS — E119 Type 2 diabetes mellitus without complications: Secondary | ICD-10-CM | POA: Diagnosis not present

## 2020-02-19 DIAGNOSIS — H53423 Scotoma of blind spot area, bilateral: Secondary | ICD-10-CM | POA: Diagnosis not present

## 2020-02-19 DIAGNOSIS — H5211 Myopia, right eye: Secondary | ICD-10-CM | POA: Diagnosis not present

## 2020-02-19 DIAGNOSIS — H40053 Ocular hypertension, bilateral: Secondary | ICD-10-CM | POA: Diagnosis not present

## 2020-02-19 DIAGNOSIS — H2513 Age-related nuclear cataract, bilateral: Secondary | ICD-10-CM | POA: Diagnosis not present

## 2020-02-19 DIAGNOSIS — H43813 Vitreous degeneration, bilateral: Secondary | ICD-10-CM | POA: Diagnosis not present

## 2020-02-19 LAB — HM DIABETES EYE EXAM

## 2020-03-07 ENCOUNTER — Other Ambulatory Visit: Payer: Self-pay | Admitting: Nurse Practitioner

## 2020-03-10 NOTE — Telephone Encounter (Signed)
Cyclobenzaprine refill 

## 2020-03-24 ENCOUNTER — Other Ambulatory Visit: Payer: Self-pay | Admitting: Nurse Practitioner

## 2020-03-24 DIAGNOSIS — H2513 Age-related nuclear cataract, bilateral: Secondary | ICD-10-CM | POA: Diagnosis not present

## 2020-03-24 DIAGNOSIS — E119 Type 2 diabetes mellitus without complications: Secondary | ICD-10-CM | POA: Diagnosis not present

## 2020-03-24 DIAGNOSIS — H53423 Scotoma of blind spot area, bilateral: Secondary | ICD-10-CM | POA: Diagnosis not present

## 2020-03-24 DIAGNOSIS — H401131 Primary open-angle glaucoma, bilateral, mild stage: Secondary | ICD-10-CM | POA: Diagnosis not present

## 2020-03-24 DIAGNOSIS — H43813 Vitreous degeneration, bilateral: Secondary | ICD-10-CM | POA: Diagnosis not present

## 2020-03-26 ENCOUNTER — Other Ambulatory Visit: Payer: Self-pay | Admitting: Nurse Practitioner

## 2020-03-26 DIAGNOSIS — E039 Hypothyroidism, unspecified: Secondary | ICD-10-CM

## 2020-04-14 ENCOUNTER — Telehealth: Payer: Self-pay | Admitting: Nurse Practitioner

## 2020-04-14 NOTE — Chronic Care Management (AMB) (Signed)
  Chronic Care Management   Outreach Note  04/14/2020 Name: Michelee Ruffing MRN: [id number] DOB: [birth year]  Halo Cardinali is a 69 y.o. year old female who is a primary care patient of Minette Brine, [place]. I reached out to Gayla Doss by phone today in response to a referral sent by Ms. [employer].     An unsuccessful telephone outreach was attempted today. The patient was referred to the case management team for assistance with care management and care coordination.   Follow Up Plan: A HIPPA compliant phone message was left for the patient providing contact information and requesting a return call. The care management team will reach out to the patient again over the next 7 days.  If patient returns call to provider office, please advise to call [place] at [phone].  [place], [place] [address] Direct Dial: [phone] [email or web address] Website: [email or web address]

## 2020-04-15 DIAGNOSIS — E119 Type 2 diabetes mellitus without complications: Secondary | ICD-10-CM | POA: Diagnosis not present

## 2020-04-15 DIAGNOSIS — H2513 Age-related nuclear cataract, bilateral: Secondary | ICD-10-CM | POA: Diagnosis not present

## 2020-04-15 DIAGNOSIS — H53423 Scotoma of blind spot area, bilateral: Secondary | ICD-10-CM | POA: Diagnosis not present

## 2020-04-15 DIAGNOSIS — H43813 Vitreous degeneration, bilateral: Secondary | ICD-10-CM | POA: Diagnosis not present

## 2020-04-15 DIAGNOSIS — H401131 Primary open-angle glaucoma, bilateral, mild stage: Secondary | ICD-10-CM | POA: Diagnosis not present

## 2020-04-17 ENCOUNTER — Encounter: Payer: Self-pay | Admitting: Nurse Practitioner

## 2020-04-17 ENCOUNTER — Other Ambulatory Visit: Payer: Self-pay

## 2020-04-17 ENCOUNTER — Ambulatory Visit (INDEPENDENT_AMBULATORY_CARE_PROVIDER_SITE_OTHER): Payer: Medicare Other | Admitting: Nurse Practitioner

## 2020-04-17 VITALS — BP 132/90 | HR 80 | Temp 98.3°F | Ht 62.8 in | Wt 205.8 lb

## 2020-04-17 DIAGNOSIS — M25561 Pain in right knee: Secondary | ICD-10-CM

## 2020-04-17 DIAGNOSIS — G8929 Other chronic pain: Secondary | ICD-10-CM

## 2020-04-17 DIAGNOSIS — E039 Hypothyroidism, unspecified: Secondary | ICD-10-CM | POA: Diagnosis not present

## 2020-04-17 DIAGNOSIS — I10 Essential (primary) hypertension: Secondary | ICD-10-CM | POA: Diagnosis not present

## 2020-04-17 DIAGNOSIS — R7303 Prediabetes: Secondary | ICD-10-CM | POA: Diagnosis not present

## 2020-04-17 DIAGNOSIS — M62830 Muscle spasm of back: Secondary | ICD-10-CM | POA: Diagnosis not present

## 2020-04-17 DIAGNOSIS — M25562 Pain in left knee: Secondary | ICD-10-CM

## 2020-04-17 MED ORDER — CYCLOBENZAPRINE HCL 10 MG PO TABS: 10.0000 mg | ORAL_TABLET | Freq: Three times a day (TID) | ORAL | 1 refills | Status: DC | PRN

## 2020-04-17 MED ORDER — ROSUVASTATIN CALCIUM 5 MG PO TABS: ORAL_TABLET | ORAL | 1 refills | Status: DC

## 2020-04-17 NOTE — Progress Notes (Signed)
Subjective:     Patient ID: Deborah [name] , female    DOB: 01/13/1951 , 69 y.o.   MRN: [id number]   Chief Complaint  Patient presents with  . Hypothyroidism  . Prediabetes    HPI  Diabetes She presents for her follow-up diabetic visit. Diabetes type: prediabetes. Her disease course has been stable. There are no hypoglycemic associated symptoms. Pertinent negatives for hypoglycemia include no dizziness or headaches. There are no diabetic associated symptoms. Pertinent negatives for diabetes include no blurred vision and no chest pain. There are no hypoglycemic complications. Symptoms are stable. There are no diabetic complications. Risk factors for coronary artery disease include diabetes mellitus, obesity and sedentary lifestyle. She is compliant with treatment most of the time. Her weight is stable. She is following a generally unhealthy diet. When asked about meal planning, she reported none. She rarely participates in exercise. There is no change in her home blood glucose trend. (Blood sugars are 100- 130's s) An ACE inhibitor/angiotensin II receptor blocker is being taken. She does not see a podiatrist.Eye exam is current (08/23/2019).  Hypertension This is a chronic problem. The current episode started more than 1 year ago. The problem has been gradually improving since onset. The problem is controlled. Pertinent negatives include no blurred vision, chest pain, headaches or palpitations. There are no associated agents to hypertension. Risk factors for coronary artery disease include dyslipidemia, diabetes mellitus, obesity and sedentary lifestyle. Past treatments include diuretics. The current treatment provides moderate improvement. Compliance problems include exercise.  There is no history of angina. There is no history of chronic renal disease.     Past Medical History:  Diagnosis Date  . Death of child 88   Due to cord strangulation.  . Diabetes mellitus without complication  ([place])   . Hypertension      Family History  Problem Relation Age of Onset  . COPD Mother   . Diabetes Mother   . Hypertension Mother   . Heart disease Father   . Hypertension Father   . Glaucoma Father      Current Outpatient Medications:  .  Blood Glucose Monitoring Suppl (ACURA BLOOD GLUCOSE METER) w/Device KIT, Check blood sugars twice daily. Please provide patient with meter and supplies that is covered by pt ins., Disp: 1 kit, Rfl: 0 .  cholecalciferol (VITAMIN D) 1000 units tablet, Take 2,000 Units by mouth daily., Disp: , Rfl:  .  cyclobenzaprine (FLEXERIL) 10 MG tablet, Take 1 tablet (10 mg total) by mouth 3 (three) times daily as needed for muscle spasms., Disp: 30 tablet, Rfl: 1 .  docusate sodium (COLACE) 100 MG capsule, Take 100 mg by mouth daily., Disp: , Rfl:  .  glimepiride (AMARYL) 2 MG tablet, TAKE ONE TABLET (2MG) BY MOUTH EVERY DAY, Disp: 90 tablet, Rfl: 1 .  hydrALAZINE (APRESOLINE) 100 MG tablet, Take 1 tablet (100 mg total) by mouth 2 (two) times daily., Disp: 180 tablet, Rfl: 1 .  hydrochlorothiazide (HYDRODIURIL) 25 MG tablet, Take 1 tablet by mouth daily, Disp: 90 tablet, Rfl: 3 .  levothyroxine (SYNTHROID) 100 MCG tablet, TAKE 1 TABLET BY MOUTH DAILY BEFORE BREAKFAST., Disp: 90 tablet, Rfl: 1 .  Magnesium 250 MG TABS, Take 1 tablet by mouth daily., Disp: , Rfl:  .  ONETOUCH ULTRA test strip, USE WITH METER TO CHECK  BLOOD SUGAR TWO TIMES  DAILY, BEFORE BREAKFAST AND BEFORE DINNER, Disp: 200 strip, Rfl: 3 .  rosuvastatin (CRESTOR) 5 MG tablet, Take 1 tablet by mouth  daily, Disp: 45 tablet, Rfl: 1 .  spironolactone (ALDACTONE) 25 MG tablet, Take 1 tablet (25 mg total) by mouth daily., Disp: 90 tablet, Rfl: 3 .  HYDROcodone-homatropine (HYDROMET) 5-1.5 MG/5ML syrup, Take 5 mLs by mouth every 6 (six) hours as needed for cough. (Patient not taking: Reported on 04/17/2020), Disp: 120 mL, Rfl: 0   Allergies  Allergen Reactions  . Amlodipine     headache  .  Aspartame And Phenylalanine Other (See Comments)    Causes migraines  . Bidil [Isosorb Dinitrate-Hydralazine]     headache  . Erythromycin     Ask patient to specify type/severity/reaction. Not indicated on medical history form dated 11/04/10.  . Ibuprofen     Ask patient to specify type/severity/reaction. Not noted on medical history form dated 11/04/10.  . Phenylalanine Other (See Comments)    Causes migraine  . Prilosec [Omeprazole] Nausea And Vomiting  . Tramadol Nausea And Vomiting  . Valsartan Cough  . Zantac [Ranitidine Hcl]      Review of Systems  Constitutional: Negative.   Eyes: Negative for blurred vision.  Respiratory: Negative for cough (chronic).   Cardiovascular: Negative.  Negative for chest pain, palpitations and leg swelling.  Musculoskeletal: Positive for arthralgias and back pain.  Neurological: Negative for dizziness and headaches.     Today's Vitals   04/17/20 0839  BP: 132/90  Pulse: 80  Temp: 98.3 F (36.8 C)  TempSrc: Oral  Weight: 205 lb 12.8 oz (93.4 kg)  Height: 5' 2.8" (1.595 m)  PainSc: 9   PainLoc: Knee   Body mass index is 36.69 kg/m.   Objective:  Physical Exam Constitutional:      Appearance: Normal appearance.  Cardiovascular:     Rate and Rhythm: Normal rate and regular rhythm.     Pulses: Normal pulses.     Heart sounds: Normal heart sounds. No murmur.  Pulmonary:     Effort: Pulmonary effort is normal. No respiratory distress.     Breath sounds: Normal breath sounds.  Musculoskeletal:        General: Tenderness (bilateral posterior thoracic back area) present. No swelling.     Right lower leg: No edema.     Left lower leg: No edema.  Skin:    General: Skin is warm and dry.     Capillary Refill: Capillary refill takes less than 2 seconds.     Coloration: Skin is not jaundiced.  Neurological:     General: No focal deficit present.     Mental Status: She is alert and oriented to person, place, and time.  Psychiatric:         Mood and Affect: Mood normal.        Behavior: Behavior normal.        Thought Content: Thought content normal.        Judgment: Judgment normal.         Assessment And Plan:     1. Essential hypertension B/P is controlled.  CMP ordered to check renal function.  The importance of regular exercise and dietary modification was stressed to the patient.  Stressed importance of losing ten percent of her body weight to help with B/P control.  The weight loss would help with decreasing cardiac and cancer risk as well.  - CMP14+EGFR  2. Hypothyroidism, unspecified type  Chronic, controlled  Continue with current medications - TSH - T4 - T3, free  3. Prediabetes  Chronic, she is now in prediabetic range  Continue with current medications    Encouraged to limit intake of sugary foods and drinks  Encouraged to increase physical activity to 150 minutes per week   - CMP14+EGFR - Hemoglobin A1c - Lipid panel - rosuvastatin (CRESTOR) 5 MG tablet; Take 1 tablet by mouth daily  Dispense: 45 tablet; Refill: 1  4. Chronic pain of both knees  Continues to see the Orthopedic who has recommended her to have knee replacements but she is not ready yet   5. Back muscle spasm  Encouraged to stretch more regularly and to consider back massage.  Muscles are tens to low back more in the middle. - cyclobenzaprine (FLEXERIL) 10 MG tablet; Take 1 tablet (10 mg total) by mouth 3 (three) times daily as needed for muscle spasms.  Dispense: 30 tablet; Refill: 1     Minette Brine, FNP    THE PATIENT IS ENCOURAGED TO PRACTICE SOCIAL DISTANCING DUE TO THE COVID-19 PANDEMIC.

## 2020-04-17 NOTE — Patient Instructions (Signed)

## 2020-04-18 LAB — LIPID PANEL
Chol/HDL Ratio: 3.2 ratio (ref 0.0–4.4)
Cholesterol, Total: 158 mg/dL (ref 100–199)
HDL: 50 mg/dL (ref >39)
LDL Chol Calc (NIH): 95 mg/dL (ref 0–99)
Triglycerides: 68 mg/dL (ref 0–149)
VLDL Cholesterol Cal: 13 mg/dL (ref 5–40)

## 2020-04-18 LAB — CMP14+EGFR
ALT: 13 IU/L (ref 0–32)
AST: 9 IU/L (ref 0–40)
Albumin/Globulin Ratio: 1.8 (ref 1.2–2.2)
Albumin: 4.4 g/dL (ref 3.8–4.8)
Alkaline Phosphatase: 94 IU/L (ref 39–117)
BUN/Creatinine Ratio: 24 (ref 12–28)
BUN: 16 mg/dL (ref 8–27)
Bilirubin Total: 0.4 mg/dL (ref 0.0–1.2)
CO2: 24 mmol/L (ref 20–29)
Calcium: 9.5 mg/dL (ref 8.7–10.3)
Chloride: 100 mmol/L (ref 96–106)
Creatinine, Ser: 0.67 mg/dL (ref 0.57–1.00)
GFR calc Af Amer: 105 mL/min/{1.73_m2} (ref >59)
GFR calc non Af Amer: 91 mL/min/{1.73_m2} (ref >59)
Globulin, Total: 2.5 g/dL (ref 1.5–4.5)
Glucose: 71 mg/dL (ref 65–99)
Potassium: 4.4 mmol/L (ref 3.5–5.2)
Sodium: 138 mmol/L (ref 134–144)
Total Protein: 6.9 g/dL (ref 6.0–8.5)

## 2020-04-18 LAB — HEMOGLOBIN A1C
Est. average glucose Bld gHb Est-mCnc: 128 mg/dL
Hgb A1c MFr Bld: 6.1 % — ABNORMAL HIGH (ref 4.8–5.6)

## 2020-04-18 LAB — T3, FREE: T3, Free: 3 pg/mL (ref 2.0–4.4)

## 2020-04-18 LAB — TSH: TSH: 0.792 u[IU]/mL (ref 0.450–4.500)

## 2020-04-18 LAB — T4: T4, Total: 9.5 ug/dL (ref 4.5–12.0)

## 2020-04-18 NOTE — Chronic Care Management (AMB) (Signed)
  Chronic Care Management   Note  04/18/2020 Name: Deborah [name] MRN: [id number] DOB: 17-Oct-1951  Deborah [name] is a 69 y.o. year old female who is a primary care patient of Minette Brine, [place] and is actively engaged with the care management team. I reached out to Gayla Doss by phone today to assist with scheduling an initial visit with the Pharmacist in response to a referral sent by Gayla Doss health plan.   Follow up plan: A second unsuccessful telephone outreach attempt made. A HIPPA compliant phone message was left for the patient providing contact information and requesting a return call. The care management team will reach out to the patient again over the next 7 days. If patient returns call to provider office, please advise to call [place] at [phone].  [place], [place] [address] Direct Dial: [phone] [email or web address] Website: [email or web address]

## 2020-04-21 ENCOUNTER — Other Ambulatory Visit: Payer: Self-pay

## 2020-04-21 ENCOUNTER — Encounter: Payer: Self-pay | Admitting: Nurse Practitioner

## 2020-04-21 MED ORDER — ONETOUCH ULTRA MINI W/DEVICE KIT: PACK | 0 refills | Status: DC

## 2020-04-21 NOTE — Chronic Care Management (AMB) (Signed)
  Chronic Care Management   Outreach Note  04/21/2020 Name: Deborah [name] MRN: [id number] DOB: [birth year]  Deborah [name] is a 69 y.o. year old female who is a primary care patient of Minette Brine, [place]. I reached out to Deborah [name] by phone today in response to a referral sent by Ms. [facility] health plan.     Third unsuccessful telephone outreach was attempted today. The patient was referred to the case management team for assistance with care management and care coordination. The patient's primary care provider has been notified of our unsuccessful attempts to make or maintain contact with the patient. The care management team is pleased to engage with this patient at any time in the future should he/she be interested in assistance from the care management team.   Follow Up Plan: A HIPPA compliant phone message was left for the patient providing contact information and requesting a return call. The care management team is available to follow up with the patient after provider conversation with the patient regarding recommendation for care management engagement and subsequent re-referral to the care management team.  If patient returns call to provider office, please advise to call [facility] at [phone].  [place], [place] [address] Direct Dial: [phone] [email or web address]: [email or web address]

## 2020-04-23 ENCOUNTER — Encounter: Payer: Self-pay | Admitting: Nurse Practitioner

## 2020-04-29 ENCOUNTER — Ambulatory Visit: Payer: Medicare Other | Admitting: Cardiovascular Disease

## 2020-06-09 ENCOUNTER — Encounter: Payer: Self-pay | Admitting: Nurse Practitioner

## 2020-06-22 ENCOUNTER — Encounter: Payer: Self-pay | Admitting: Nurse Practitioner

## 2020-06-24 ENCOUNTER — Other Ambulatory Visit: Payer: Self-pay | Admitting: Nurse Practitioner

## 2020-06-24 DIAGNOSIS — R059 Cough, unspecified: Secondary | ICD-10-CM

## 2020-06-24 MED ORDER — HYDROCODONE-HOMATROPINE 5-1.5 MG/5ML PO SYRP: 5.0000 mL | ORAL_SOLUTION | Freq: Four times a day (QID) | ORAL | 0 refills | Status: DC | PRN

## 2020-06-27 ENCOUNTER — Other Ambulatory Visit: Payer: Self-pay | Admitting: Nurse Practitioner

## 2020-07-30 LAB — FECAL OCCULT BLOOD, GUAIAC: Fecal Occult Blood: NEGATIVE

## 2020-08-12 ENCOUNTER — Encounter: Payer: Self-pay | Admitting: Nurse Practitioner

## 2020-08-13 ENCOUNTER — Other Ambulatory Visit: Payer: Self-pay | Admitting: Cardiovascular Disease

## 2020-08-13 DIAGNOSIS — I10 Essential (primary) hypertension: Secondary | ICD-10-CM

## 2020-08-14 ENCOUNTER — Other Ambulatory Visit: Payer: Self-pay

## 2020-08-14 ENCOUNTER — Encounter: Payer: Self-pay | Admitting: Nurse Practitioner

## 2020-08-18 ENCOUNTER — Other Ambulatory Visit: Payer: Self-pay

## 2020-08-18 ENCOUNTER — Ambulatory Visit (INDEPENDENT_AMBULATORY_CARE_PROVIDER_SITE_OTHER): Payer: Medicare Other | Admitting: Nurse Practitioner

## 2020-08-18 ENCOUNTER — Encounter: Payer: Self-pay | Admitting: Nurse Practitioner

## 2020-08-18 VITALS — BP 132/90 | HR 72 | Temp 97.9°F | Ht 62.8 in | Wt 211.2 lb

## 2020-08-18 DIAGNOSIS — E119 Type 2 diabetes mellitus without complications: Secondary | ICD-10-CM | POA: Diagnosis not present

## 2020-08-18 DIAGNOSIS — I10 Essential (primary) hypertension: Secondary | ICD-10-CM

## 2020-08-18 DIAGNOSIS — E2839 Other primary ovarian failure: Secondary | ICD-10-CM

## 2020-08-18 DIAGNOSIS — Z1211 Encounter for screening for malignant neoplasm of colon: Secondary | ICD-10-CM

## 2020-08-18 DIAGNOSIS — E039 Hypothyroidism, unspecified: Secondary | ICD-10-CM | POA: Diagnosis not present

## 2020-08-18 DIAGNOSIS — G729 Myopathy, unspecified: Secondary | ICD-10-CM

## 2020-08-18 MED ORDER — ROSUVASTATIN CALCIUM 5 MG PO TABS: ORAL_TABLET | ORAL | 1 refills | Status: DC

## 2020-08-18 MED ORDER — LEVOTHYROXINE SODIUM 100 MCG PO TABS: ORAL_TABLET | ORAL | 1 refills | Status: DC

## 2020-08-18 MED ORDER — GLIMEPIRIDE 2 MG PO TABS: 2.0000 mg | ORAL_TABLET | Freq: Every day | ORAL | 1 refills | Status: DC

## 2020-08-18 MED ORDER — HYDRALAZINE HCL 100 MG PO TABS: 100.0000 mg | ORAL_TABLET | Freq: Two times a day (BID) | ORAL | 1 refills | Status: DC

## 2020-08-18 NOTE — Patient Instructions (Signed)
Diabetes Basics  Diabetes (diabetes mellitus) is a long-term (chronic) disease. It occurs when the body does not properly use sugar (glucose) that is released from food after you eat. Diabetes may be caused by one or both of these problems:  Your pancreas does not make enough of a hormone called insulin.  Your body does not react in a normal way to insulin that it makes. Insulin lets sugars (glucose) go into cells in your body. This gives you energy. If you have diabetes, sugars cannot get into cells. This causes high blood sugar (hyperglycemia). Follow these instructions at home: How is diabetes treated? You may need to take insulin or other diabetes medicines daily to keep your blood sugar in balance. Take your diabetes medicines every day as told by your doctor. List your diabetes medicines here: Diabetes medicines  Name of medicine: ______________________________ ? Amount (dose): _______________ Time (a.m./p.m.): _______________ Notes: ___________________________________  Name of medicine: ______________________________ ? Amount (dose): _______________ Time (a.m./p.m.): _______________ Notes: ___________________________________  Name of medicine: ______________________________ ? Amount (dose): _______________ Time (a.m./p.m.): _______________ Notes: ___________________________________ If you use insulin, you will learn how to give yourself insulin by injection. You may need to adjust the amount based on the food that you eat. List the types of insulin you use here: Insulin  Insulin type: ______________________________ ? Amount (dose): _______________ Time (a.m./p.m.): _______________ Notes: ___________________________________  Insulin type: ______________________________ ? Amount (dose): _______________ Time (a.m./p.m.): _______________ Notes: ___________________________________  Insulin type: ______________________________ ? Amount (dose): _______________ Time (a.m./p.m.):  _______________ Notes: ___________________________________  Insulin type: ______________________________ ? Amount (dose): _______________ Time (a.m./p.m.): _______________ Notes: ___________________________________  Insulin type: ______________________________ ? Amount (dose): _______________ Time (a.m./p.m.): _______________ Notes: ___________________________________ How do I manage my blood sugar?  Check your blood sugar levels using a blood glucose monitor as directed by your doctor. Your doctor will set treatment goals for you. Generally, you should have these blood sugar levels:  Before meals (preprandial): 80-130 mg/dL (4.4-7.2 mmol/L).  After meals (postprandial): below 180 mg/dL (10 mmol/L).  A1c level: less than 7%. Write down the times that you will check your blood sugar levels: Blood sugar checks  Time: _______________ Notes: ___________________________________  Time: _______________ Notes: ___________________________________  Time: _______________ Notes: ___________________________________  Time: _______________ Notes: ___________________________________  Time: _______________ Notes: ___________________________________  Time: _______________ Notes: ___________________________________  What do I need to know about low blood sugar? Low blood sugar is called hypoglycemia. This is when blood sugar is at or below 70 mg/dL (3.9 mmol/L). Symptoms may include:  Feeling: ? Hungry. ? Worried or nervous (anxious). ? Sweaty and clammy. ? Confused. ? Dizzy. ? Sleepy. ? Sick to your stomach (nauseous).  Having: ? A fast heartbeat. ? A headache. ? A change in your vision. ? Tingling or no feeling (numbness) around the mouth, lips, or tongue. ? Jerky movements that you cannot control (seizure).  Having trouble with: ? Moving (coordination). ? Sleeping. ? Passing out (fainting). ? Getting upset easily (irritability). Treating low blood sugar To treat low blood  sugar, eat or drink something sugary right away. If you can think clearly and swallow safely, follow the 15:15 rule:  Take 15 grams of a fast-acting carb (carbohydrate). Talk with your doctor about how much you should take.  Some fast-acting carbs are: ? Sugar tablets (glucose pills). Take 3-4 glucose pills. ? 6-8 pieces of hard candy. ? 4-6 oz (120-150 mL) of fruit juice. ? 4-6 oz (120-150 mL) of regular (not diet) soda. ? 1 Tbsp (15 mL) honey or sugar.    Check your blood sugar 15 minutes after you take the carb.  If your blood sugar is still at or below 70 mg/dL (3.9 mmol/L), take 15 grams of a carb again.  If your blood sugar does not go above 70 mg/dL (3.9 mmol/L) after 3 tries, get help right away.  After your blood sugar goes back to normal, eat a meal or a snack within 1 hour. Treating very low blood sugar If your blood sugar is at or below 54 mg/dL (3 mmol/L), you have very low blood sugar (severe hypoglycemia). This is an emergency. Do not wait to see if the symptoms will go away. Get medical help right away. Call your local emergency services (911 in the [place] Do not drive yourself to the hospital. Questions to ask your health care provider  Do I need to meet with a diabetes educator?  What equipment will I need to care for myself at home?  What diabetes medicines do I need? When should I take them?  How often do I need to check my blood sugar?  What number can I call if I have questions?  When is my next doctor's visit?  Where can I find a support group for people with diabetes? Where to find more information  American Diabetes Association: www.diabetes.org  American Association of Diabetes Educators: www.diabeteseducator.org/patient-resources Contact a doctor if:  Your blood sugar is at or above 240 mg/dL (13.3 mmol/L) for 2 days in a row.  You have been sick or have had a fever for 2 days or more, and you are not getting better.  You have any of these  problems for more than 6 hours: ? You cannot eat or drink. ? You feel sick to your stomach (nauseous). ? You throw up (vomit). ? You have watery poop (diarrhea). Get help right away if:  Your blood sugar is lower than 54 mg/dL (3 mmol/L).  You get confused.  You have trouble: ? Thinking clearly. ? Breathing. Summary  Diabetes (diabetes mellitus) is a long-term (chronic) disease. It occurs when the body does not properly use sugar (glucose) that is released from food after digestion.  Take insulin and diabetes medicines as told.  Check your blood sugar every day, as often as told.  Keep all follow-up visits as told by your doctor. This is important. This information is not intended to replace advice given to you by your health care provider. Make sure you discuss any questions you have with your health care provider. Document Revised: 08/29/2019 Document Reviewed: 03/10/2018 Elsevier Patient Education  2020 Elsevier Inc.   Hypertension, Adult Hypertension is another name for high blood pressure. High blood pressure forces your heart to work harder to pump blood. This can cause problems over time. There are two numbers in a blood pressure reading. There is a top number (systolic) over a bottom number (diastolic). It is best to have a blood pressure that is below 120/80. Healthy choices can help lower your blood pressure, or you may need medicine to help lower it. What are the causes? The cause of this condition is not known. Some conditions may be related to high blood pressure. What increases the risk?  Smoking.  Having type 2 diabetes mellitus, high cholesterol, or both.  Not getting enough exercise or physical activity.  Being overweight.  Having too much fat, sugar, calories, or salt (sodium) in your diet.  Drinking too much alcohol.  Having long-term (chronic) kidney disease.  Having a family history of high blood   pressure.  Age. Risk increases with  age.  Race. You may be at higher risk if you are African American.  Gender. Men are at higher risk than women before age 45. After age 65, women are at higher risk than men.  Having obstructive sleep apnea.  Stress. What are the signs or symptoms?  High blood pressure may not cause symptoms. Very high blood pressure (hypertensive crisis) may cause: ? Headache. ? Feelings of worry or nervousness (anxiety). ? Shortness of breath. ? Nosebleed. ? A feeling of being sick to your stomach (nausea). ? Throwing up (vomiting). ? Changes in how you see. ? Very bad chest pain. ? Seizures. How is this treated?  This condition is treated by making healthy lifestyle changes, such as: ? Eating healthy foods. ? Exercising more. ? Drinking less alcohol.  Your health care provider may prescribe medicine if lifestyle changes are not enough to get your blood pressure under control, and if: ? Your top number is above 130. ? Your bottom number is above 80.  Your personal target blood pressure may vary. Follow these instructions at home: Eating and drinking   If told, follow the DASH eating plan. To follow this plan: ? Fill one half of your plate at each meal with fruits and vegetables. ? Fill one fourth of your plate at each meal with whole grains. Whole grains include whole-wheat pasta, brown rice, and whole-grain bread. ? Eat or drink low-fat dairy products, such as skim milk or low-fat yogurt. ? Fill one fourth of your plate at each meal with low-fat (lean) proteins. Low-fat proteins include fish, chicken without skin, eggs, beans, and tofu. ? Avoid fatty meat, cured and processed meat, or chicken with skin. ? Avoid pre-made or processed food.  Eat less than 1,500 mg of salt each day.  Do not drink alcohol if: ? Your doctor tells you not to drink. ? You are pregnant, may be pregnant, or are planning to become pregnant.  If you drink alcohol: ? Limit how much you use to:  0-1 drink a  day for women.  0-2 drinks a day for men. ? Be aware of how much alcohol is in your drink. In the [place] one drink equals one 12 oz bottle of beer (355 mL), one 5 oz glass of wine (148 mL), or one 1 oz glass of hard liquor (44 mL). Lifestyle   Work with your doctor to stay at a healthy weight or to lose weight. Ask your doctor what the best weight is for you.  Get at least 30 minutes of exercise most days of the week. This may include walking, swimming, or biking.  Get at least 30 minutes of exercise that strengthens your muscles (resistance exercise) at least 3 days a week. This may include lifting weights or doing Pilates.  Do not use any products that contain nicotine or tobacco, such as cigarettes, e-cigarettes, and chewing tobacco. If you need help quitting, ask your doctor.  Check your blood pressure at home as told by your doctor.  Keep all follow-up visits as told by your doctor. This is important. Medicines  Take over-the-counter and prescription medicines only as told by your doctor. Follow directions carefully.  Do not skip doses of blood pressure medicine. The medicine does not work as well if you skip doses. Skipping doses also puts you at risk for problems.  Ask your doctor about side effects or reactions to medicines that you should watch for. Contact a doctor if you:    Think you are having a reaction to the medicine you are taking.  Have headaches that keep coming back (recurring).  Feel dizzy.  Have swelling in your ankles.  Have trouble with your vision. Get help right away if you:  Get a very bad headache.  Start to feel mixed up (confused).  Feel weak or numb.  Feel faint.  Have very bad pain in your: ? Chest. ? Belly (abdomen).  Throw up more than once.  Have trouble breathing. Summary  Hypertension is another name for high blood pressure.  High blood pressure forces your heart to work harder to pump blood.  For most people, a normal  blood pressure is less than 120/80.  Making healthy choices can help lower blood pressure. If your blood pressure does not get lower with healthy choices, you may need to take medicine. This information is not intended to replace advice given to you by your health care provider. Make sure you discuss any questions you have with your health care provider. Document Revised: 08/16/2018 Document Reviewed: 08/16/2018 Elsevier Patient Education  2020 Elsevier Inc.  

## 2020-08-18 NOTE — Progress Notes (Signed)
I,Yamilka Roman [employer] as a Education administrator for [employer], FNP.,have documented all relevant documentation on the behalf of Minette Brine, FNP,as directed by  Minette Brine, FNP while in the presence of Minette Brine, [place].  This visit occurred during the SARS-CoV-2 public health emergency.  Safety protocols were in place, including screening questions prior to the visit, additional usage of staff PPE, and extensive cleaning of exam room while observing appropriate contact time as indicated for disinfecting solutions.  Subjective:     Patient ID: Deborah [name] , female    DOB: July 11, 1951 , 69 y.o.   MRN: [id number]   Chief Complaint  Patient presents with  . Diabetes  . Hypothyroidism    HPI  Wt Readings from Last 3 Encounters: 08/18/20 : 211 lb 3.2 oz (95.8 kg) 04/17/20 : 205 lb 12.8 oz (93.4 kg) 01/31/20 : 203 lb 6.4 oz (92.3 kg)   Diabetes She presents for her follow-up diabetic visit. Diabetes type: prediabetes. Her disease course has been stable. There are no hypoglycemic associated symptoms. Pertinent negatives for hypoglycemia include no dizziness or headaches. There are no diabetic associated symptoms. Pertinent negatives for diabetes include no blurred vision and no chest pain. There are no hypoglycemic complications. Symptoms are stable. There are no diabetic complications. Risk factors for coronary artery disease include diabetes mellitus, obesity and sedentary lifestyle. She is compliant with treatment most of the time. Her weight is stable. She is following a generally unhealthy diet. When asked about meal planning, she reported none. She has not had a previous visit with a dietitian. She rarely participates in exercise. There is no change in her home blood glucose trend. 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 She does  not see a podiatrist.Eye exam is current (08/23/2019).  Hypertension This is a chronic problem. The current episode started more than 1 year ago. The problem has been gradually improving since onset. The problem is controlled. Pertinent negatives include no blurred vision, chest pain, headaches or palpitations. There are no associated agents to hypertension. Risk factors for coronary artery disease include dyslipidemia, diabetes mellitus, obesity and sedentary lifestyle. Past treatments include diuretics. The current treatment provides moderate improvement. Compliance problems include exercise.  There is no history of angina. There is no history of chronic renal disease.     Past Medical History:  Diagnosis Date  . Death of child 2   Due to cord strangulation.  . Diabetes mellitus without complication ([place])   . Hypertension      Family History  Problem Relation Age of Onset  . COPD Mother   . Diabetes Mother   . Hypertension Mother   . Heart disease Father   . Hypertension Father   . Glaucoma Father      Current Outpatient Medications:  .  Blood Glucose Monitoring Suppl (ACURA BLOOD GLUCOSE METER) w/Device KIT, Check blood sugars twice daily. Please provide patient with meter and supplies that is covered by pt ins., Disp: 1 kit, Rfl: 0 .  Blood Glucose Monitoring Suppl (ONE TOUCH ULTRA MINI) w/Device KIT, Use to check blood sugars 3 times a day. Dx code:e11.65, Disp: 1 kit, Rfl: 0 .  cholecalciferol (VITAMIN D) 1000 units tablet, Take 2,000 Units by mouth daily., Disp: , Rfl:  .  cyclobenzaprine (FLEXERIL) 10 MG tablet, Take 1 tablet (10 mg total) by mouth 3 (three) times  daily as needed for muscle spasms., Disp: 30 tablet, Rfl: 1 .  docusate sodium (COLACE) 100 MG capsule, Take 100 mg by mouth daily., Disp: , Rfl:  .  glimepiride (AMARYL) 2 MG tablet, Take 1 tablet (2 mg total) by mouth daily with breakfast., Disp: 90 tablet, Rfl: 1 .  hydrALAZINE (APRESOLINE) 100 MG tablet, Take 1  tablet (100 mg total) by mouth 2 (two) times daily., Disp: 180 tablet, Rfl: 1 .  hydrochlorothiazide (HYDRODIURIL) 25 MG tablet, Take 1 tablet by mouth daily, Disp: 90 tablet, Rfl: 3 .  HYDROcodone-homatropine (HYDROMET) 5-1.5 MG/5ML syrup, Take 5 mLs by mouth every 6 (six) hours as needed for cough., Disp: 120 mL, Rfl: 0 .  levothyroxine (SYNTHROID) 100 MCG tablet, TAKE 1 TABLET BY MOUTH DAILY BEFORE BREAKFAST., Disp: 90 tablet, Rfl: 1 .  Magnesium 250 MG TABS, Take 1 tablet by mouth daily., Disp: , Rfl:  .  ONETOUCH ULTRA test strip, USE WITH METER TO CHECK  BLOOD SUGAR TWO TIMES  DAILY, BEFORE BREAKFAST AND BEFORE DINNER, Disp: 200 strip, Rfl: 3 .  rosuvastatin (CRESTOR) 5 MG tablet, Take 1 tablet by mouth MWF, Disp: 45 tablet, Rfl: 1 .  spironolactone (ALDACTONE) 25 MG tablet, Take 1 tablet (25 mg total) by mouth daily., Disp: 90 tablet, Rfl: 3   Allergies  Allergen Reactions  . Amlodipine     headache  . Aspartame And Phenylalanine Other (See Comments)    Causes migraines  . Bidil [Isosorb Dinitrate-Hydralazine]     headache  . Erythromycin     Ask patient to specify type/severity/reaction. Not indicated on medical history form dated 11/04/10.  . Ibuprofen     Ask patient to specify type/severity/reaction. Not noted on medical history form dated 11/04/10.  Marland Kitchen Phenylalanine Other (See Comments)    Causes migraine  . Prilosec [Omeprazole] Nausea And Vomiting  . Tramadol Nausea And Vomiting  . Valsartan Cough  . Zantac [Ranitidine Hcl]      Review of Systems  Eyes: Negative for blurred vision.  Cardiovascular: Negative for chest pain and palpitations.  Neurological: Negative for dizziness and headaches.     Today's Vitals   08/18/20 0846  BP: 132/90  Pulse: 72  Temp: 97.9 F (36.6 C)  TempSrc: Oral  Weight: 211 lb 3.2 oz (95.8 kg)  Height: 5' 2.8" (1.595 m)  PainSc: 7   PainLoc: Knee   Body mass index is 37.65 kg/m.   Objective:  Physical Exam Vitals reviewed.   Constitutional:      General: She is not in acute distress.    Appearance: Normal appearance. She is obese.  Cardiovascular:     Rate and Rhythm: Normal rate and regular rhythm.     Pulses: Normal pulses.     Heart sounds: Normal heart sounds. No murmur heard.   Pulmonary:     Effort: Pulmonary effort is normal. No respiratory distress.     Breath sounds: Normal breath sounds.  Musculoskeletal:        General: No swelling or tenderness.     Right lower leg: No edema.     Left lower leg: No edema.     Comments: Walks with bent over position  Skin:    General: Skin is warm and dry.     Capillary Refill: Capillary refill takes less than 2 seconds.     Coloration: Skin is not jaundiced.  Neurological:     General: No focal deficit present.     Mental Status: She is alert  and oriented to person, place, and time.     Cranial Nerves: No cranial nerve deficit.  Psychiatric:        Mood and Affect: Mood normal.        Behavior: Behavior normal.        Thought Content: Thought content normal.        Judgment: Judgment normal.         Assessment And Plan:     1. Hypothyroidism, unspecified type  Chronic, controlled  Continue with current medications  I have sent a refill for levothyroxine to [facility] - T3, free - T4 - TSH - levothyroxine (SYNTHROID) 100 MCG tablet; TAKE 1 TABLET BY MOUTH DAILY BEFORE BREAKFAST.  Dispense: 90 tablet; Refill: 1  2. Type 2 diabetes mellitus without complication, without long-term current use of insulin (HCC)  Chronic, stable  Continue with current medications  Encouraged to limit intake of sugary foods and drinks  Encouraged to increase physical activity to 150 minutes per week with chair exercises - CMP14+EGFR - Hemoglobin A1c - Lipid panel - glimepiride (AMARYL) 2 MG tablet; Take 1 tablet (2 mg total) by mouth daily with breakfast.  Dispense: 90 tablet; Refill: 1 - rosuvastatin (CRESTOR) 5 MG tablet; Take 1 tablet by mouth MWF   Dispense: 45 tablet; Refill: 1  3. Myopathy  Will check CK to ensure muscles are not affected by rosuvastatin  Encouraged to drink adequate amounts of water  She is taking rosuvastatin MWF - CK, total  4. Essential hypertension  Chronic, continue follow up with Dr. Oval Linsey  - hydrALAZINE (APRESOLINE) 100 MG tablet; Take 1 tablet (100 mg total) by mouth 2 (two) times daily.  Dispense: 180 tablet; Refill: 1  5. Decreased estrogen level - DG Bone Density; Future  6. Encounter for screening colonoscopy  Ordered cologuard, denies family history of colon cancer and denies abnormal colonoscopy in the past - Cologuard     Patient was given opportunity to ask questions. Patient verbalized understanding of the plan and was able to repeat key elements of the plan. All questions were answered to their satisfaction.  Minette Brine, FNP   I, Minette Brine, FNP, have reviewed all documentation for this visit. The documentation on 08/18/20 for the exam, diagnosis, procedures, and orders are all accurate and complete.  THE PATIENT IS ENCOURAGED TO PRACTICE SOCIAL DISTANCING DUE TO THE COVID-19 PANDEMIC.

## 2020-08-19 LAB — CMP14+EGFR
ALT: 17 IU/L (ref 0–32)
AST: 13 IU/L (ref 0–40)
Albumin/Globulin Ratio: 1.6 (ref 1.2–2.2)
Albumin: 4.4 g/dL (ref 3.8–4.8)
Alkaline Phosphatase: 93 IU/L (ref 48–121)
BUN/Creatinine Ratio: 29 — ABNORMAL HIGH (ref 12–28)
BUN: 19 mg/dL (ref 8–27)
Bilirubin Total: 0.3 mg/dL (ref 0.0–1.2)
CO2: 24 mmol/L (ref 20–29)
Calcium: 10 mg/dL (ref 8.7–10.3)
Chloride: 98 mmol/L (ref 96–106)
Creatinine, Ser: 0.66 mg/dL (ref 0.57–1.00)
GFR calc Af Amer: 105 mL/min/{1.73_m2} (ref >59)
GFR calc non Af Amer: 91 mL/min/{1.73_m2} (ref >59)
Globulin, Total: 2.7 g/dL (ref 1.5–4.5)
Glucose: 80 mg/dL (ref 65–99)
Potassium: 4.4 mmol/L (ref 3.5–5.2)
Sodium: 136 mmol/L (ref 134–144)
Total Protein: 7.1 g/dL (ref 6.0–8.5)

## 2020-08-19 LAB — HEMOGLOBIN A1C
Est. average glucose Bld gHb Est-mCnc: 131 mg/dL
Hgb A1c MFr Bld: 6.2 % — ABNORMAL HIGH (ref 4.8–5.6)

## 2020-08-19 LAB — LIPID PANEL
Chol/HDL Ratio: 3.2 ratio (ref 0.0–4.4)
Cholesterol, Total: 169 mg/dL (ref 100–199)
HDL: 53 mg/dL (ref >39)
LDL Chol Calc (NIH): 103 mg/dL — ABNORMAL HIGH (ref 0–99)
Triglycerides: 65 mg/dL (ref 0–149)
VLDL Cholesterol Cal: 13 mg/dL (ref 5–40)

## 2020-08-19 LAB — CK: Total CK: 178 U/L (ref 32–182)

## 2020-08-19 LAB — T3, FREE: T3, Free: 3 pg/mL (ref 2.0–4.4)

## 2020-08-19 LAB — TSH: TSH: 1.26 u[IU]/mL (ref 0.450–4.500)

## 2020-08-19 LAB — T4: T4, Total: 9.1 ug/dL (ref 4.5–12.0)

## 2020-08-20 DIAGNOSIS — H2513 Age-related nuclear cataract, bilateral: Secondary | ICD-10-CM | POA: Diagnosis not present

## 2020-08-20 DIAGNOSIS — H43813 Vitreous degeneration, bilateral: Secondary | ICD-10-CM | POA: Diagnosis not present

## 2020-08-20 DIAGNOSIS — H53423 Scotoma of blind spot area, bilateral: Secondary | ICD-10-CM | POA: Diagnosis not present

## 2020-08-20 DIAGNOSIS — E119 Type 2 diabetes mellitus without complications: Secondary | ICD-10-CM | POA: Diagnosis not present

## 2020-08-20 DIAGNOSIS — H401131 Primary open-angle glaucoma, bilateral, mild stage: Secondary | ICD-10-CM | POA: Diagnosis not present

## 2020-08-22 ENCOUNTER — Other Ambulatory Visit: Payer: Self-pay

## 2020-08-22 DIAGNOSIS — I10 Essential (primary) hypertension: Secondary | ICD-10-CM

## 2020-08-22 MED ORDER — SPIRONOLACTONE 25 MG PO TABS: 25.0000 mg | ORAL_TABLET | Freq: Every day | ORAL | 2 refills | Status: DC

## 2020-08-22 MED ORDER — HYDROCHLOROTHIAZIDE 25 MG PO TABS: ORAL_TABLET | ORAL | 2 refills | Status: DC

## 2020-09-05 DIAGNOSIS — Z1211 Encounter for screening for malignant neoplasm of colon: Secondary | ICD-10-CM | POA: Diagnosis not present

## 2020-09-10 ENCOUNTER — Other Ambulatory Visit: Payer: Self-pay | Admitting: Nurse Practitioner

## 2020-09-10 DIAGNOSIS — R059 Cough, unspecified: Secondary | ICD-10-CM

## 2020-09-10 DIAGNOSIS — H401122 Primary open-angle glaucoma, left eye, moderate stage: Secondary | ICD-10-CM | POA: Diagnosis not present

## 2020-09-10 LAB — COLOGUARD: Cologuard: POSITIVE — AB

## 2020-09-11 ENCOUNTER — Other Ambulatory Visit: Payer: Self-pay | Admitting: Nurse Practitioner

## 2020-09-11 ENCOUNTER — Encounter: Payer: Self-pay | Admitting: Nurse Practitioner

## 2020-09-11 DIAGNOSIS — E119 Type 2 diabetes mellitus without complications: Secondary | ICD-10-CM

## 2020-09-11 MED ORDER — HYDROCODONE-HOMATROPINE 5-1.5 MG/5ML PO SYRP: 5.0000 mL | ORAL_SOLUTION | Freq: Four times a day (QID) | ORAL | 0 refills | Status: DC | PRN

## 2020-09-11 NOTE — Telephone Encounter (Signed)
Patient is requesting a refill

## 2020-09-16 ENCOUNTER — Ambulatory Visit: Payer: Medicare Other | Attending: Internal Medicine

## 2020-09-16 DIAGNOSIS — Z23 Encounter for immunization: Secondary | ICD-10-CM

## 2020-09-16 NOTE — Progress Notes (Signed)
   Covid-19 Vaccination Clinic  Name:  Nyeemah Jennette    MRN: [id number] DOB: 28-Feb-1951  09/16/2020  Ms. Valek was observed post Covid-19 immunization for 15 minutes without incident. She was provided with Vaccine Information Sheet and instruction to access the V-Safe system.   Ms. Kunz was instructed to call 911 with any severe reactions post vaccine: Marland Kitchen Difficulty breathing  . Swelling of face and throat  . A fast heartbeat  . A bad rash all over body  . Dizziness and weakness

## 2020-10-04 ENCOUNTER — Other Ambulatory Visit: Payer: Self-pay | Admitting: Nurse Practitioner

## 2020-10-04 DIAGNOSIS — E039 Hypothyroidism, unspecified: Secondary | ICD-10-CM

## 2020-10-07 ENCOUNTER — Telehealth: Payer: Self-pay

## 2020-10-07 NOTE — Telephone Encounter (Signed)
I left a message asking the pt to call and reschedule AWV with Nickeah.  I explained that she will still have the physical w/ Janece on 10/21/20.

## 2020-10-10 DIAGNOSIS — H401111 Primary open-angle glaucoma, right eye, mild stage: Secondary | ICD-10-CM | POA: Diagnosis not present

## 2020-10-15 ENCOUNTER — Other Ambulatory Visit: Payer: Self-pay

## 2020-10-15 ENCOUNTER — Ambulatory Visit (INDEPENDENT_AMBULATORY_CARE_PROVIDER_SITE_OTHER): Payer: Medicare Other

## 2020-10-15 VITALS — BP 148/90 | HR 72 | Temp 98.0°F | Ht 63.0 in | Wt 211.2 lb

## 2020-10-15 DIAGNOSIS — Z0000 Encounter for general adult medical examination without abnormal findings: Secondary | ICD-10-CM

## 2020-10-15 NOTE — Progress Notes (Signed)
This visit occurred during the SARS-CoV-2 public health emergency.  Safety protocols were in place, including screening questions prior to the visit, additional usage of staff PPE, and extensive cleaning of exam room while observing appropriate contact time as indicated for disinfecting solutions.  Subjective:   Deborah [name] is a 69 y.o. female who presents for Medicare Annual (Subsequent) preventive examination.  Review of Systems     Cardiac Risk Factors include: advanced age (>11mn, >>54women);diabetes mellitus;hypertension;obesity (BMI >30kg/m2);sedentary lifestyle     Objective:    Today's Vitals   10/15/20 0835 10/15/20 0836  BP: (!) 148/90   Pulse: 72   Temp: 98 F (36.7 C)   TempSrc: Oral   SpO2: 96%   Weight: 211 lb 3.2 oz (95.8 kg)   Height: '5\' 3"'  (1.6 m)   PainSc:  8    Body mass index is 37.41 kg/m.  Advanced Directives 10/15/2020 10/17/2019 10/11/2018 07/05/2017 07/17/2015  Does Patient Have a Medical Advance Directive? Yes Yes No No No  Type of AParamedicof [place] will [place] will - - -  Copy of HViennain Chart? No - copy requested No - copy requested - - -  Would patient like information on creating a medical advance directive? - - Yes (MAU/Ambulatory/Procedural Areas - Information given) - No - patient declined information    Current Medications (verified) Outpatient Encounter Medications as of 10/15/2020  Medication Sig  . Blood Glucose Monitoring Suppl (ACURA BLOOD GLUCOSE METER) w/Device KIT Check blood sugars twice daily. Please provide patient with meter and supplies that is covered by pt ins.  . Blood Glucose Monitoring Suppl (ONE TOUCH ULTRA MINI) w/Device KIT Use to check blood sugars 3 times a day. Dx code:e11.65  . cholecalciferol (VITAMIN D) 1000 units tablet Take 2,000 Units by mouth daily.  . cyclobenzaprine (FLEXERIL) 10 MG tablet Take 1 tablet (10 mg  total) by mouth 3 (three) times daily as needed for muscle spasms.  .Marland Kitchendocusate sodium (COLACE) 100 MG capsule Take 100 mg by mouth daily.  .Marland Kitchenglimepiride (AMARYL) 2 MG tablet TAKE ONE TABLET (2MG) BY MOUTH EVERY DAY  . hydrALAZINE (APRESOLINE) 100 MG tablet Take 1 tablet (100 mg total) by mouth 2 (two) times daily.  . hydrochlorothiazide (HYDRODIURIL) 25 MG tablet Take 1 tablet by mouth daily  . HYDROcodone-homatropine (HYDROMET) 5-1.5 MG/5ML syrup Take 5 mLs by mouth every 6 (six) hours as needed for cough.  . Magnesium 250 MG TABS Take 1 tablet by mouth daily.  .Glory RosebushULTRA test strip USE WITH METER TO CHECK  BLOOD SUGAR TWO TIMES  DAILY, BEFORE BREAKFAST AND BEFORE DINNER  . rosuvastatin (CRESTOR) 5 MG tablet Take 1 tablet by mouth MWF  . spironolactone (ALDACTONE) 25 MG tablet Take 1 tablet (25 mg total) by mouth daily.  .Marland KitchenSYNTHROID 100 MCG tablet TAKE 1 TABLET BY MOUTH DAILY BEFORE BREAKFAST.   No facility-administered encounter medications on file as of 10/15/2020.    Allergies (verified) Amlodipine, Aspartame and phenylalanine, Bidil [isosorb dinitrate-hydralazine], Erythromycin, Ibuprofen, Phenylalanine, Prilosec [omeprazole], Tramadol, Valsartan, and Zantac [ranitidine hcl]   History: Past Medical History:  Diagnosis Date  . Death of child 139  Due to cord strangulation.  . Diabetes mellitus without complication ([place]   . Hypertension    Past Surgical History:  Procedure Laterality Date  . APPENDECTOMY  1974  . CARDIAC CATHETERIZATION     two, dates not provided.  . CHOLECYSTECTOMY  confirm date with patient, was it 35?  Marland Kitchen LAPAROSCOPIC TUBAL LIGATION  1987   Family History  Problem Relation Age of Onset  . COPD Mother   . Diabetes Mother   . Hypertension Mother   . Heart disease Father   . Hypertension Father   . Glaucoma Father    Social History   Socioeconomic History  . Marital status: Married    Spouse name: Not on file  . Number of children:  Not on file  . Years of education: Not on file  . Highest education level: Not on file  Occupational History  . Occupation: retired  Tobacco Use  . Smoking status: Never Smoker  . Smokeless tobacco: Never Used  Vaping Use  . Vaping Use: Never used  Substance and Sexual Activity  . Alcohol use: Not Currently  . Drug use: No  . Sexual activity: Not Currently  Other Topics Concern  . Not on file  Social History Narrative  . Not on file   Social Determinants of Health   Financial Resource Strain: Low Risk   . Difficulty of Paying Living Expenses: Not hard at all  Food Insecurity: No Food Insecurity  . Worried About Charity fundraiser in the Last Year: Never true  . Ran Out of Food in the Last Year: Never true  Transportation Needs: No Transportation Needs  . Lack of Transportation (Medical): No  . Lack of Transportation (Non-Medical): No  Physical Activity: Inactive  . Days of Exercise per Week: 0 days  . Minutes of Exercise per Session: 0 min  Stress: No Stress Concern Present  . Feeling of Stress : Not at all  Social Connections:   . Frequency of Communication with Friends and Family: Not on file  . Frequency of Social Gatherings with Friends and Family: Not on file  . Attends Religious Services: Not on file  . Active Member of Clubs or Organizations: Not on file  . Attends Archivist Meetings: Not on file  . Marital Status: Not on file    Tobacco Counseling Counseling given: Not Answered   Clinical Intake:  Pre-visit preparation completed: Yes  Pain : 0-10 Pain Score: 8  Pain Type: Chronic pain Pain Location: Knee Pain Orientation: Left, Right Pain Descriptors / Indicators: Constant Pain Onset: More than a month ago Pain Frequency: Constant     Nutritional Status: BMI > 30  Obese Nutritional Risks: None Diabetes: Yes  How often do you need to have someone help you when you read instructions, pamphlets, or other written materials from your  doctor or pharmacy?: 1 - Never What is the last grade level you completed in school?: BSN  Diabetic? Yes Nutrition Risk Assessment:  Has the patient had any N/V/D within the last 2 months?  No  Does the patient have any non-healing wounds?  No  Has the patient had any unintentional weight loss or weight gain?  No   Diabetes:  Is the patient diabetic?  Yes  If diabetic, was a CBG obtained today?  No  Did the patient bring in their glucometer from home?  No  How often do you monitor your CBG's? daily.   Financial Strains and Diabetes Management:  Are you having any financial strains with the device, your supplies or your medication? No .  Does the patient want to be seen by Chronic Care Management for management of their diabetes?  No  Would the patient like to be referred to a Nutritionist or for  Diabetic Management?  No   Diabetic Exams:  Diabetic Eye Exam: Completed 02/19/2020 Diabetic Foot Exam: Completed 10/17/2019   Interpreter Needed?: No  Information entered by :: NAllen LPN   Activities of Daily Living In your present state of health, do you have any difficulty performing the following activities: 10/15/2020 10/17/2019  Hearing? N N  Vision? N N  Difficulty concentrating or making decisions? N N  Walking or climbing stairs? Y Y  Comment due to knees sometimes, knee problems  Dressing or bathing? N N  Doing errands, shopping? N N  Preparing Food and eating ? N N  Using the Toilet? N N  In the past six months, have you accidently leaked urine? Y Y  Comment all the time all the time  Do you have problems with loss of bowel control? N N  Managing your Medications? N N  Managing your Finances? N N  Housekeeping or managing your Housekeeping? N N  Some recent data might be hidden    Patient Care Team: Minette Brine, FNP as PCP - General (General Practice)  Indicate any recent Medical Services you may have received from other than [facility] providers in the past year  (date may be approximate).     Assessment:   This is a routine wellness examination for [place].  Hearing/Vision screen  Hearing Screening   '125Hz'  '250Hz'  '500Hz'  '1000Hz'  '2000Hz'  '3000Hz'  '4000Hz'  '6000Hz'  '8000Hz'   Right ear:           Left ear:           Vision Screening Comments: Regular eye exams, Dr. Katy Fitch  Dietary issues and exercise activities discussed: Current Exercise Habits: The patient does not participate in regular exercise at present  Goals    .  Patient Stated      10/17/2019, no goals set at this time    .  Patient Stated      10/15/2020, no goals    .  Weight (lb) < 200 lb (90.7 kg) (pt-stated)      Going to silver sneakers 3 times a week      Depression Screen PHQ 2/9 Scores 10/15/2020 01/17/2020 10/17/2019 07/18/2019 04/30/2019 04/12/2019 03/27/2019  PHQ - 2 Score 0 0 0 0 0 0 0  PHQ- 9 Score - - 2 - - - -    Fall Risk Fall Risk  10/15/2020 01/17/2020 10/17/2019 07/18/2019 04/30/2019  Falls in the past year? 0 0 0 0 0  Number falls in past yr: - - 0 - -  Risk for fall due to : Impaired balance/gait;Medication side effect - Medication side effect - -  Follow up Falls evaluation completed;Education provided;Falls prevention discussed - Falls evaluation completed;Education provided;Falls prevention discussed - -    Any stairs in or around the home? No  If so, are there any without handrails? n/a Home free of loose throw rugs in walkways, pet beds, electrical cords, etc? Yes  Adequate lighting in your home to reduce risk of falls? Yes   ASSISTIVE DEVICES UTILIZED TO PREVENT FALLS:  Life alert? No  Use of a cane, walker or w/c? No  Grab bars in the bathroom? Yes  Shower chair or bench in shower? No  Elevated toilet seat or a handicapped toilet? No   TIMED UP AND GO:  Was the test performed? No ..   Gait slow and steady without use of assistive device  Cognitive Function:     6CIT Screen 10/15/2020 10/17/2019 10/11/2018  What Year? 0 points 0  points 0 points    What month? 0 points 0 points 0 points  What time? 0 points 0 points 0 points  Count back from 20 0 points 0 points 0 points  Months in reverse 0 points 0 points 0 points  Repeat phrase 0 points 2 points 0 points  Total Score 0 2 0    Immunizations Immunization History  Administered Date(s) Administered  . Influenza, High Dose Seasonal PF 10/11/2018  . Influenza-Unspecified 10/12/2020  . PFIZER SARS-COV-2 Vaccination 12/22/2019, 01/09/2020, 09/16/2020  . Pneumococcal Conjugate-13 10/17/2018  . Tdap 10/19/2019    TDAP status: Up to date Flu Vaccine status: Up to date Pneumococcal vaccine status: Up to date Covid-19 vaccine status: Completed vaccines  Qualifies for Shingles Vaccine? Yes   Zostavax completed No   Shingrix Completed?: No.    Education has been provided regarding the importance of this vaccine. Patient has been advised to call insurance company to determine out of pocket expense if they have not yet received this vaccine. Advised may also receive vaccine at local pharmacy or Health Dept. Verbalized acceptance and understanding.  Screening Tests Health Maintenance  Topic Date Due  . COLONOSCOPY  03/27/2020  . DEXA SCAN  10/16/2020 (Originally 05/23/2017)  . FOOT EXAM  10/16/2020  . HEMOGLOBIN A1C  02/16/2021  . OPHTHALMOLOGY EXAM  02/18/2021  . COLON CANCER SCREENING ANNUAL FOBT  07/30/2021  . MAMMOGRAM  12/10/2021  . TETANUS/TDAP  10/18/2029  . INFLUENZA VACCINE  Completed  . COVID-19 Vaccine  Completed  . Hepatitis C Screening  Completed  . PNA vac Low Risk Adult  Completed    Health Maintenance  Health Maintenance Due  Topic Date Due  . COLONOSCOPY  03/27/2020    Colorectal cancer screening: Completed 07/30/2020. Repeat every 1 year FOBT Mammogram status: Completed 12/11/2019. Repeat every year Bone Density status: scheduled for 12/10/2020  Lung Cancer Screening: (Low Dose CT Chest recommended if Age 37-80 years, 30 pack-year currently smoking OR  have quit w/in 15years.) does not qualify.   Lung Cancer Screening Referral: no  Additional Screening:  Hepatitis C Screening: does qualify; Completed 10/11/2018  Vision Screening: Recommended annual ophthalmology exams for early detection of glaucoma and other disorders of the eye. Is the patient up to date with their annual eye exam?  Yes  Who is the provider or what is the name of the office in which the patient attends annual eye exams? Dr. Katy Fitch If pt is not established with a provider, would they like to be referred to a provider to establish care? No .   Dental Screening: Recommended annual dental exams for proper oral hygiene  Community Resource Referral / Chronic Care Management: CRR required this visit?  No   CCM required this visit?  No      Plan:     I have personally reviewed and noted the following in the patient's chart:   . Medical and social history . Use of alcohol, tobacco or illicit drugs  . Current medications and supplements . Functional ability and status . Nutritional status . Physical activity . Advanced directives . List of other physicians . Hospitalizations, surgeries, and ER visits in previous 12 months . Vitals . Screenings to include cognitive, depression, and falls . Referrals and appointments  In addition, I have reviewed and discussed with patient certain preventive protocols, quality metrics, and best practice recommendations. A written personalized care plan for preventive services as well as general preventive health recommendations were provided to patient.  Kellie Simmering, LPN   [id number]   Nurse Notes:

## 2020-10-15 NOTE — Patient Instructions (Signed)
Deborah [name] , Thank you for taking time to come for your Medicare Wellness Visit. I appreciate your ongoing commitment to your health goals. Please review the following plan we discussed and let me know if I can assist you in the future.   Screening recommendations/referrals: Colonoscopy: FOBT completed 07/30/2020, due 07/30/2021 Mammogram: completed 12/11/2019, due 12/10/2020 Bone Density: scheduled for 12/10/2020 Recommended yearly ophthalmology/optometry visit for glaucoma screening and checkup Recommended yearly dental visit for hygiene and checkup  Vaccinations: Influenza vaccine: completed 10/12/2020, due 07/20/2021 Pneumococcal vaccine: competed 10/17/2018 Tdap vaccine: completed 10/19/2019, due 10/18/2029 Shingles vaccine: discuseed   Covid-19: 09/16/2020, 01/09/2020, 12/22/2019  Advanced directives: Please bring a copy of your POA (Power of Attorney) and/or Living Will to your next appointment.   Conditions/risks identified: none  Next appointment: 10/21/2020 at 9:00 Follow up in one year for your annual wellness visit    Preventive Care 65 Years and Older, Female Preventive care refers to lifestyle choices and visits with your health care provider that can promote health and wellness. What does preventive care include?  A yearly physical exam. This is also called an annual well check.  Dental exams once or twice a year.  Routine eye exams. Ask your health care provider how often you should have your eyes checked.  Personal lifestyle choices, including:  Daily care of your teeth and gums.  Regular physical activity.  Eating a healthy diet.  Avoiding tobacco and drug use.  Limiting alcohol use.  Practicing safe sex.  Taking low-dose aspirin every day.  Taking vitamin and mineral supplements as recommended by your health care provider. What happens during an annual well check? The services and screenings done by your health care provider during your annual well  check will depend on your age, overall health, lifestyle risk factors, and family history of disease. Counseling  Your health care provider may ask you questions about your:  Alcohol use.  Tobacco use.  Drug use.  Emotional well-being.  Home and relationship well-being.  Sexual activity.  Eating habits.  History of falls.  Memory and ability to understand (cognition).  Work and work Statistician.  Reproductive health. Screening  You may have the following tests or measurements:  Height, weight, and BMI.  Blood pressure.  Lipid and cholesterol levels. These may be checked every 5 years, or more frequently if you are over 15 years old.  Skin check.  Lung cancer screening. You may have this screening every year starting at age 89 if you have a 30-pack-year history of smoking and currently smoke or have quit within the past 15 years.  Fecal occult blood test (FOBT) of the stool. You may have this test every year starting at age 16.  Flexible sigmoidoscopy or colonoscopy. You may have a sigmoidoscopy every 5 years or a colonoscopy every 10 years starting at age 88.  Hepatitis C blood test.  Hepatitis B blood test.  Sexually transmitted disease (STD) testing.  Diabetes screening. This is done by checking your blood sugar (glucose) after you have not eaten for a while (fasting). You may have this done every 1-3 years.  Bone density scan. This is done to screen for osteoporosis. You may have this done starting at age 16.  Mammogram. This may be done every 1-2 years. Talk to your health care provider about how often you should have regular mammograms. Talk with your health care provider about your test results, treatment options, and if necessary, the need for more tests. Vaccines  Your health care  provider may recommend certain vaccines, such as:  Influenza vaccine. This is recommended every year.  Tetanus, diphtheria, and acellular pertussis (Tdap, Td) vaccine. You  may need a Td booster every 10 years.  Zoster vaccine. You may need this after age 38.  Pneumococcal 13-valent conjugate (PCV13) vaccine. One dose is recommended after age 109.  Pneumococcal polysaccharide (PPSV23) vaccine. One dose is recommended after age 72. Talk to your health care provider about which screenings and vaccines you need and how often you need them. This information is not intended to replace advice given to you by your health care provider. Make sure you discuss any questions you have with your health care provider. Document Released: 01/02/2016 Document Revised: 08/25/2016 Document Reviewed: 10/07/2015 Elsevier Interactive Patient Education  2017 [employer] Prevention in the Home Falls can cause injuries. They can happen to people of all ages. There are many things you can do to make your home safe and to help prevent falls. What can I do on the outside of my home?  Regularly fix the edges of walkways and driveways and fix any cracks.  Remove anything that might make you trip as you walk through a door, such as a raised step or threshold.  Trim any bushes or trees on the path to your home.  Use bright outdoor lighting.  Clear any walking paths of anything that might make someone trip, such as rocks or tools.  Regularly check to see if handrails are loose or broken. Make sure that both sides of any steps have handrails.  Any raised decks and porches should have guardrails on the edges.  Have any leaves, snow, or ice cleared regularly.  Use sand or salt on walking paths during winter.  Clean up any spills in your garage right away. This includes oil or grease spills. What can I do in the bathroom?  Use night lights.  Install grab bars by the toilet and in the tub and shower. Do not use towel bars as grab bars.  Use non-skid mats or decals in the tub or shower.  If you need to sit down in the shower, use a plastic, non-slip stool.  Keep the floor  dry. Clean up any water that spills on the floor as soon as it happens.  Remove soap buildup in the tub or shower regularly.  Attach bath mats securely with double-sided non-slip rug tape.  Do not have throw rugs and other things on the floor that can make you trip. What can I do in the bedroom?  Use night lights.  Make sure that you have a light by your bed that is easy to reach.  Do not use any sheets or blankets that are too big for your bed. They should not hang down onto the floor.  Have a firm chair that has side arms. You can use this for support while you get dressed.  Do not have throw rugs and other things on the floor that can make you trip. What can I do in the kitchen?  Clean up any spills right away.  Avoid walking on wet floors.  Keep items that you use a lot in easy-to-reach places.  If you need to reach something above you, use a strong step stool that has a grab bar.  Keep electrical cords out of the way.  Do not use floor polish or wax that makes floors slippery. If you must use wax, use non-skid floor wax.  Do not have throw  rugs and other things on the floor that can make you trip. What can I do with my stairs?  Do not leave any items on the stairs.  Make sure that there are handrails on both sides of the stairs and use them. Fix handrails that are broken or loose. Make sure that handrails are as long as the stairways.  Check any carpeting to make sure that it is firmly attached to the stairs. Fix any carpet that is loose or worn.  Avoid having throw rugs at the top or bottom of the stairs. If you do have throw rugs, attach them to the floor with carpet tape.  Make sure that you have a light switch at the top of the stairs and the bottom of the stairs. If you do not have them, ask someone to add them for you. What else can I do to help prevent falls?  Wear shoes that:  Do not have high heels.  Have rubber bottoms.  Are comfortable and fit you  well.  Are closed at the toe. Do not wear sandals.  If you use a stepladder:  Make sure that it is fully opened. Do not climb a closed stepladder.  Make sure that both sides of the stepladder are locked into place.  Ask someone to hold it for you, if possible.  Clearly mark and make sure that you can see:  Any grab bars or handrails.  First and last steps.  Where the edge of each step is.  Use tools that help you move around (mobility aids) if they are needed. These include:  Canes.  Walkers.  Scooters.  Crutches.  Turn on the lights when you go into a dark area. Replace any light bulbs as soon as they burn out.  Set up your furniture so you have a clear path. Avoid moving your furniture around.  If any of your floors are uneven, fix them.  If there are any pets around you, be aware of where they are.  Review your medicines with your doctor. Some medicines can make you feel dizzy. This can increase your chance of falling. Ask your doctor what other things that you can do to help prevent falls. This information is not intended to replace advice given to you by your health care provider. Make sure you discuss any questions you have with your health care provider. Document Released: 10/02/2009 Document Revised: 05/13/2016 Document Reviewed: 01/10/2015 Elsevier Interactive Patient Education  2017 [employer].

## 2020-10-21 ENCOUNTER — Encounter: Payer: Self-pay | Admitting: Nurse Practitioner

## 2020-10-21 ENCOUNTER — Other Ambulatory Visit: Payer: Self-pay

## 2020-10-21 ENCOUNTER — Ambulatory Visit (INDEPENDENT_AMBULATORY_CARE_PROVIDER_SITE_OTHER): Payer: Medicare Other | Admitting: Nurse Practitioner

## 2020-10-21 ENCOUNTER — Ambulatory Visit: Payer: Medicare Other

## 2020-10-21 VITALS — BP 142/80 | HR 71 | Temp 98.1°F | Ht 63.0 in | Wt 211.2 lb

## 2020-10-21 DIAGNOSIS — Z0000 Encounter for general adult medical examination without abnormal findings: Secondary | ICD-10-CM | POA: Diagnosis not present

## 2020-10-21 DIAGNOSIS — Z1231 Encounter for screening mammogram for malignant neoplasm of breast: Secondary | ICD-10-CM

## 2020-10-21 DIAGNOSIS — E559 Vitamin D deficiency, unspecified: Secondary | ICD-10-CM

## 2020-10-21 DIAGNOSIS — Z1211 Encounter for screening for malignant neoplasm of colon: Secondary | ICD-10-CM

## 2020-10-21 DIAGNOSIS — E119 Type 2 diabetes mellitus without complications: Secondary | ICD-10-CM

## 2020-10-21 DIAGNOSIS — E039 Hypothyroidism, unspecified: Secondary | ICD-10-CM

## 2020-10-21 DIAGNOSIS — R7303 Prediabetes: Secondary | ICD-10-CM

## 2020-10-21 DIAGNOSIS — I10 Essential (primary) hypertension: Secondary | ICD-10-CM | POA: Diagnosis not present

## 2020-10-21 LAB — POCT URINALYSIS DIPSTICK
Bilirubin, UA: NEGATIVE
Blood, UA: NEGATIVE
Glucose, UA: NEGATIVE
Ketones, UA: NEGATIVE
Leukocytes, UA: NEGATIVE
Nitrite, UA: NEGATIVE
Protein, UA: NEGATIVE
Spec Grav, UA: 1.02 (ref 1.010–1.025)
Urobilinogen, UA: 0.2 E.U./dL
pH, UA: 7 (ref 5.0–8.0)

## 2020-10-21 LAB — POCT UA - MICROALBUMIN
Albumin/Creatinine Ratio, Urine, POC: 30
Creatinine, POC: 100 mg/dL
Microalbumin Ur, POC: 10 mg/L

## 2020-10-21 NOTE — Patient Instructions (Signed)
Health Maintenance After Age 68 After age 68, you are at a higher risk for certain long-term diseases and infections as well as injuries from falls. Falls are a major cause of broken bones and head injuries in people who are older than age 68. Getting regular preventive care can help to keep you healthy and well. Preventive care includes getting regular testing and making lifestyle changes as recommended by your health care provider. Talk with your health care provider about:  Which screenings and tests you should have. A screening is a test that checks for a disease when you have no symptoms.  A diet and exercise plan that is right for you. What should I know about screenings and tests to prevent falls? Screening and testing are the best ways to find a health problem early. Early diagnosis and treatment give you the best chance of managing medical conditions that are common after age 68. Certain conditions and lifestyle choices may make you more likely to have a fall. Your health care provider may recommend:  Regular vision checks. Poor vision and conditions such as cataracts can make you more likely to have a fall. If you wear glasses, make sure to get your prescription updated if your vision changes.  Medicine review. Work with your health care provider to regularly review all of the medicines you are taking, including over-the-counter medicines. Ask your health care provider about any side effects that may make you more likely to have a fall. Tell your health care provider if any medicines that you take make you feel dizzy or sleepy.  Osteoporosis screening. Osteoporosis is a condition that causes the bones to get weaker. This can make the bones weak and cause them to break more easily.  Blood pressure screening. Blood pressure changes and medicines to control blood pressure can make you feel dizzy.  Strength and balance checks. Your health care provider may recommend certain tests to check your  strength and balance while standing, walking, or changing positions.  Foot health exam. Foot pain and numbness, as well as not wearing proper footwear, can make you more likely to have a fall.  Depression screening. You may be more likely to have a fall if you have a fear of falling, feel emotionally low, or feel unable to do activities that you used to do.  Alcohol use screening. Using too much alcohol can affect your balance and may make you more likely to have a fall. What actions can I take to lower my risk of falls? General instructions  Talk with your health care provider about your risks for falling. Tell your health care provider if: ? You fall. Be sure to tell your health care provider about all falls, even ones that seem minor. ? You feel dizzy, sleepy, or off-balance.  Take over-the-counter and prescription medicines only as told by your health care provider. These include any supplements.  Eat a healthy diet and maintain a healthy weight. A healthy diet includes low-fat dairy products, low-fat (lean) meats, and fiber from whole grains, beans, and lots of fruits and vegetables. Home safety  Remove any tripping hazards, such as rugs, cords, and clutter.  Install safety equipment such as grab bars in bathrooms and safety rails on stairs.  Keep rooms and walkways well-lit. Activity   Follow a regular exercise program to stay fit. This will help you maintain your balance. Ask your health care provider what types of exercise are appropriate for you.  If you need a cane or   walker, use it as recommended by your health care provider.  Wear supportive shoes that have nonskid soles. Lifestyle  Do not drink alcohol if your health care provider tells you not to drink.  If you drink alcohol, limit how much you have: ? 0-1 drink a day for women. ? 0-2 drinks a day for men.  Be aware of how much alcohol is in your drink. In the [place] one drink equals one typical bottle of beer (12  oz), one-half glass of wine (5 oz), or one shot of hard liquor (1 oz).  Do not use any products that contain nicotine or tobacco, such as cigarettes and e-cigarettes. If you need help quitting, ask your health care provider. Summary  Having a healthy lifestyle and getting preventive care can help to protect your health and wellness after age 68.  Screening and testing are the best way to find a health problem early and help you avoid having a fall. Early diagnosis and treatment give you the best chance for managing medical conditions that are more common for people who are older than age 68.  Falls are a major cause of broken bones and head injuries in people who are older than age 68. Take precautions to prevent a fall at home.  Work with your health care provider to learn what changes you can make to improve your health and wellness and to prevent falls. This information is not intended to replace advice given to you by your health care provider. Make sure you discuss any questions you have with your health care provider. Document Revised: 03/29/2019 Document Reviewed: 10/19/2017 Elsevier Patient Education  2020 [employer]  

## 2020-10-21 NOTE — Progress Notes (Signed)
I,Yamilka Roman [employer] as a Education administrator for [employer], FNP.,have documented all relevant documentation on the behalf of Minette Brine, FNP,as directed by  Minette Brine, FNP while in the presence of Minette Brine, [place]. This visit occurred during the SARS-CoV-2 public health emergency.  Safety protocols were in place, including screening questions prior to the visit, additional usage of staff PPE, and extensive cleaning of exam room while observing appropriate contact time as indicated for disinfecting solutions.  Subjective:     Patient ID: Rajvi Armentor , female    DOB: 23-Dec-1950 , 69 y.o.   MRN: [id number]   Chief Complaint  Patient presents with  . Annual Exam    HPI  Here for HM   Wt Readings from Last 3 Encounters: 10/21/20 : 211 lb 3.2 oz (95.8 kg) 10/15/20 : 211 lb 3.2 oz (95.8 kg) 08/18/20 : 211 lb 3.2 oz (95.8 kg)   Diabetes She presents for her follow-up diabetic visit. Diabetes type: prediabetes. Her disease course has been stable. There are no hypoglycemic associated symptoms. Pertinent negatives for hypoglycemia include no dizziness or headaches. There are no diabetic associated symptoms. Pertinent negatives for diabetes include no blurred vision and no chest pain. There are no hypoglycemic complications. Symptoms are stable. There are no diabetic complications. Risk factors for coronary artery disease include diabetes mellitus, obesity and sedentary lifestyle. She is compliant with treatment most of the time. Her weight is stable. She is following a generally unhealthy diet. When asked about meal planning, she reported none. She has not had a previous visit with a dietitian. She rarely participates in exercise. There is no change in her home blood glucose trend. (Blood sugars are 89-130) An ACE inhibitor/angiotensin II receptor blocker is being taken. She does not see a podiatrist.Eye exam is current (03/10/2020).  Hypertension This is a chronic problem. The current  episode started more than 1 year ago. The problem has been gradually improving since onset. The problem is controlled. Pertinent negatives include no blurred vision, chest pain, headaches or palpitations. There are no associated agents to hypertension. Risk factors for coronary artery disease include dyslipidemia, diabetes mellitus, obesity and sedentary lifestyle. Past treatments include diuretics. The current treatment provides moderate improvement. Compliance problems include exercise.  There is no history of angina. There is no history of chronic renal disease.     Past Medical History:  Diagnosis Date  . Death of child 21   Due to cord strangulation.  . Diabetes mellitus without complication ([place])   . Hypertension      Family History  Problem Relation Age of Onset  . COPD Mother   . Diabetes Mother   . Hypertension Mother   . Heart disease Father   . Hypertension Father   . Glaucoma Father      Current Outpatient Medications:  .  Blood Glucose Monitoring Suppl (ACURA BLOOD GLUCOSE METER) w/Device KIT, Check blood sugars twice daily. Please provide patient with meter and supplies that is covered by pt ins., Disp: 1 kit, Rfl: 0 .  Blood Glucose Monitoring Suppl (ONE TOUCH ULTRA MINI) w/Device KIT, Use to check blood sugars 3 times a day. Dx code:e11.65, Disp: 1 kit, Rfl: 0 .  cholecalciferol (VITAMIN D) 1000 units tablet, Take 2,000 Units by mouth daily., Disp: , Rfl:  .  cyclobenzaprine (FLEXERIL) 10 MG tablet, Take 1 tablet (10 mg total) by mouth 3 (three) times daily as needed for muscle spasms., Disp: 30 tablet, Rfl: 1 .  docusate sodium (COLACE) 100  MG capsule, Take 100 mg by mouth daily., Disp: , Rfl:  .  glimepiride (AMARYL) 2 MG tablet, TAKE ONE TABLET (2MG) BY MOUTH EVERY DAY, Disp: 90 tablet, Rfl: 1 .  hydrALAZINE (APRESOLINE) 100 MG tablet, Take 1 tablet (100 mg total) by mouth 2 (two) times daily., Disp: 180 tablet, Rfl: 1 .  hydrochlorothiazide (HYDRODIURIL) 25 MG  tablet, Take 1 tablet by mouth daily, Disp: 90 tablet, Rfl: 2 .  HYDROcodone-homatropine (HYDROMET) 5-1.5 MG/5ML syrup, Take 5 mLs by mouth every 6 (six) hours as needed for cough., Disp: 120 mL, Rfl: 0 .  Magnesium 250 MG TABS, Take 1 tablet by mouth daily., Disp: , Rfl:  .  ONETOUCH ULTRA test strip, USE WITH METER TO CHECK  BLOOD SUGAR TWO TIMES  DAILY, BEFORE BREAKFAST AND BEFORE DINNER, Disp: 200 strip, Rfl: 3 .  rosuvastatin (CRESTOR) 5 MG tablet, Take 1 tablet by mouth MWF, Disp: 45 tablet, Rfl: 1 .  spironolactone (ALDACTONE) 25 MG tablet, Take 1 tablet (25 mg total) by mouth daily., Disp: 90 tablet, Rfl: 2 .  SYNTHROID 100 MCG tablet, TAKE 1 TABLET BY MOUTH DAILY BEFORE BREAKFAST., Disp: 90 tablet, Rfl: 1   Allergies  Allergen Reactions  . Amlodipine     headache  . Aspartame And Phenylalanine Other (See Comments)    Causes migraines  . Bidil [Isosorb Dinitrate-Hydralazine]     headache  . Erythromycin     Ask patient to specify type/severity/reaction. Not indicated on medical history form dated 11/04/10.  . Ibuprofen     Ask patient to specify type/severity/reaction. Not noted on medical history form dated 11/04/10.  Marland Kitchen Phenylalanine Other (See Comments)    Causes migraine  . Prilosec [Omeprazole] Nausea And Vomiting  . Tramadol Nausea And Vomiting  . Valsartan Cough  . Zantac [Ranitidine Hcl]       No LMP recorded. Patient is postmenopausal.. Negative for Dysmenorrhea and Negative for Menorrhagia. Negative for: breast discharge, breast lump(s), breast pain and breast self exam. Associated symptoms include abnormal vaginal bleeding. Pertinent negatives include abnormal bleeding (hematology), anxiety, decreased libido, depression, difficulty falling sleep, dyspareunia, history of infertility, nocturia, sexual dysfunction, sleep disturbances, urinary incontinence, urinary urgency, vaginal discharge and vaginal itching. Diet regular.  The patient states her exercise level is minimal  - she has been walking at work. She has not started back at the gym.   The patient's tobacco use is:  Social History   Tobacco Use  Smoking Status Never Smoker  Smokeless Tobacco Never Used   She has been exposed to passive smoke. The patient's alcohol use is:  Social History   Substance and Sexual Activity  Alcohol Use Not Currently   Additional information: Last pap 10/17/2019, next one scheduled for 10/16/2022  Review of Systems  Constitutional: Negative.   HENT: Negative.   Eyes: Negative.  Negative for blurred vision.  Respiratory: Positive for cough.   Cardiovascular: Negative.  Negative for chest pain, palpitations and leg swelling.  Gastrointestinal: Negative.   Endocrine: Negative.   Genitourinary: Negative.   Musculoskeletal: Negative.   Skin: Negative.   Allergic/Immunologic: Negative.   Neurological: Negative.  Negative for dizziness and headaches.  Hematological: Negative.   Psychiatric/Behavioral: Negative.      Today's Vitals   10/21/20 0901  BP: (!) 142/80  Pulse: 71  Temp: 98.1 F (36.7 C)  TempSrc: Oral  Weight: 211 lb 3.2 oz (95.8 kg)  Height: '5\' 3"'  (1.6 m)  PainSc: 0-No pain   Body mass index is  37.41 kg/m.   Objective:  Physical Exam Constitutional:      General: She is not in acute distress.    Appearance: Normal appearance. She is well-developed. She is obese.  HENT:     Head: Normocephalic and atraumatic.     Right Ear: Hearing, tympanic membrane, ear canal and external ear normal. There is no impacted cerumen.     Left Ear: Hearing, tympanic membrane, ear canal and external ear normal. There is no impacted cerumen.     Nose:     Comments: Deferred - masked    Mouth/Throat:     Comments: Deferred - masked Eyes:     General: Lids are normal.     Extraocular Movements: Extraocular movements intact.     Conjunctiva/sclera: Conjunctivae normal.     Pupils: Pupils are equal, round, and reactive to light.     Funduscopic exam:     Right eye: No papilledema.        Left eye: No papilledema.  Neck:     Thyroid: No thyroid mass.     Vascular: No carotid bruit.  Cardiovascular:     Rate and Rhythm: Normal rate and regular rhythm.     Pulses: Normal pulses.     Heart sounds: Normal heart sounds. No murmur heard.   Pulmonary:     Effort: Pulmonary effort is normal. No respiratory distress.     Breath sounds: Normal breath sounds. No wheezing.  Chest:     Chest wall: No mass.     Breasts: Tanner Score is 5.        Right: Normal. No mass or tenderness.        Left: Normal. No mass or tenderness.  Abdominal:     General: Abdomen is flat. Bowel sounds are normal. There is no distension.     Palpations: Abdomen is soft.     Tenderness: There is no abdominal tenderness.  Genitourinary:    Rectum: Guaiac result negative.  Musculoskeletal:        General: No swelling. Normal range of motion.     Cervical back: Full passive range of motion without pain, normal range of motion and neck supple.     Right lower leg: No edema.     Left lower leg: No edema.  Lymphadenopathy:     Upper Body:     Right upper body: No supraclavicular, axillary or pectoral adenopathy.     Left upper body: No supraclavicular, axillary or pectoral adenopathy.  Skin:    General: Skin is warm and dry.     Capillary Refill: Capillary refill takes less than 2 seconds.  Neurological:     General: No focal deficit present.     Mental Status: She is alert and oriented to person, place, and time.     Cranial Nerves: No cranial nerve deficit.     Sensory: No sensory deficit.  Psychiatric:        Mood and Affect: Mood normal.        Behavior: Behavior normal.        Thought Content: Thought content normal.        Judgment: Judgment normal.         Assessment And Plan:     1. Encounter for general adult medical examination w/o abnormal findings . Behavior modifications discussed and diet history reviewed.   . Pt will continue to exercise  regularly and modify diet with low GI, plant based foods and decrease intake of processed foods.  Marland Kitchen  Recommend intake of daily multivitamin, Vitamin D, and calcium.  . Recommend mammogram and colonoscopy (she had cologuard done which was positive and will refer to GI for further evaluation) for preventive screenings, as well as recommend immunizations that include influenza, TDAP  2. Type 2 diabetes mellitus without complication, without long-term current use of insulin (HCC)  Chronic, controlled  Continue with current medications  Encouraged to limit intake of sugary foods and drinks  Encouraged to increase physical activity to 150 minutes per week as tolerated - POCT Urinalysis Dipstick (81002) - POCT UA - Microalbumin  3. Essential hypertension . B/P is fairly controlled.  . CMP ordered to check renal function.  . The importance of regular exercise and dietary modification was stressed to the patient.  . Stressed importance of losing ten percent of her body weight to help with B/P control.  . The weight loss would help with decreasing cardiac and cancer risk as well.  . EKG done in January with Dr. Oval Linsey  4. Hypothyroidism, unspecified type  Chronic, controlled  Continue with current medications  5. Vitamin D deficiency  Will check vitamin D level and supplement as needed.     Also encouraged to spend 15 minutes in the sun daily.   6. Screening mammogram, encounter for  Pt instructed on Self Breast Exam.According to ACOG guidelines Women aged 44 and older are recommended to get an annual mammogram. Form completed and given to patient contact the [facility] for appointment scheduing.   Pt encouraged to get annual mammogram - MM DIGITAL SCREENING BILATERAL; Future  7. Encounter for screening colonoscopy  According to USPTF Colorectal cancer Screening guidelines. Colonoscopy is recommended every 10 years, starting at age 47years.  Will refer to GI for colon  cancer screening. - Ambulatory referral to Gastroenterology   Patient was given opportunity to ask questions. Patient verbalized understanding of the plan and was able to repeat key elements of the plan. All questions were answered to their satisfaction.    Teola Bradley, FNP, have reviewed all documentation for this visit. The documentation on 10/27/20 for the exam, diagnosis, procedures, and orders are all accurate and complete.   THE PATIENT IS ENCOURAGED TO PRACTICE SOCIAL DISTANCING DUE TO THE COVID-19 PANDEMIC.

## 2020-10-22 LAB — CBC
Hematocrit: 35.5 % (ref 34.0–46.6)
Hemoglobin: 11.8 g/dL (ref 11.1–15.9)
MCH: 28.2 pg (ref 26.6–33.0)
MCHC: 33.2 g/dL (ref 31.5–35.7)
MCV: 85 fL (ref 79–97)
Platelets: 438 10*3/uL (ref 150–450)
RBC: 4.18 x10E6/uL (ref 3.77–5.28)
RDW: 13 % (ref 11.7–15.4)
WBC: 6 10*3/uL (ref 3.4–10.8)

## 2020-10-22 LAB — CMP14+EGFR
ALT: 16 IU/L (ref 0–32)
AST: 13 IU/L (ref 0–40)
Albumin/Globulin Ratio: 1.6 (ref 1.2–2.2)
Albumin: 4.5 g/dL (ref 3.8–4.8)
Alkaline Phosphatase: 96 IU/L (ref 44–121)
BUN/Creatinine Ratio: 29 — ABNORMAL HIGH (ref 12–28)
BUN: 20 mg/dL (ref 8–27)
Bilirubin Total: 0.4 mg/dL (ref 0.0–1.2)
CO2: 28 mmol/L (ref 20–29)
Calcium: 9.5 mg/dL (ref 8.7–10.3)
Chloride: 98 mmol/L (ref 96–106)
Creatinine, Ser: 0.69 mg/dL (ref 0.57–1.00)
GFR calc Af Amer: 103 mL/min/{1.73_m2} (ref >59)
GFR calc non Af Amer: 90 mL/min/{1.73_m2} (ref >59)
Globulin, Total: 2.8 g/dL (ref 1.5–4.5)
Glucose: 74 mg/dL (ref 65–99)
Potassium: 4.3 mmol/L (ref 3.5–5.2)
Sodium: 137 mmol/L (ref 134–144)
Total Protein: 7.3 g/dL (ref 6.0–8.5)

## 2020-10-22 LAB — LIPID PANEL
Chol/HDL Ratio: 3.2 ratio (ref 0.0–4.4)
Cholesterol, Total: 171 mg/dL (ref 100–199)
HDL: 54 mg/dL (ref >39)
LDL Chol Calc (NIH): 104 mg/dL — ABNORMAL HIGH (ref 0–99)
Triglycerides: 70 mg/dL (ref 0–149)
VLDL Cholesterol Cal: 13 mg/dL (ref 5–40)

## 2020-10-22 LAB — TSH: TSH: 2.88 u[IU]/mL (ref 0.450–4.500)

## 2020-10-22 LAB — T4: T4, Total: 9.2 ug/dL (ref 4.5–12.0)

## 2020-10-22 LAB — HEMOGLOBIN A1C
Est. average glucose Bld gHb Est-mCnc: 134 mg/dL
Hgb A1c MFr Bld: 6.3 % — ABNORMAL HIGH (ref 4.8–5.6)

## 2020-10-22 LAB — VITAMIN D 25 HYDROXY (VIT D DEFICIENCY, FRACTURES): Vit D, 25-Hydroxy: 46.4 ng/mL (ref 30.0–100.0)

## 2020-10-22 LAB — HM DIABETES EYE EXAM

## 2020-10-22 LAB — T3, FREE: T3, Free: 2.7 pg/mL (ref 2.0–4.4)

## 2020-10-29 ENCOUNTER — Encounter: Payer: Self-pay | Admitting: Orthopedic Surgery

## 2020-10-30 NOTE — Telephone Encounter (Signed)
Can do pls have her come in soon thx

## 2020-11-19 ENCOUNTER — Ambulatory Visit: Payer: Self-pay

## 2020-11-19 ENCOUNTER — Other Ambulatory Visit: Payer: Self-pay

## 2020-11-19 ENCOUNTER — Ambulatory Visit (INDEPENDENT_AMBULATORY_CARE_PROVIDER_SITE_OTHER): Payer: Medicare Other | Admitting: Orthopedic Surgery

## 2020-11-19 DIAGNOSIS — M25561 Pain in right knee: Secondary | ICD-10-CM | POA: Diagnosis not present

## 2020-11-19 DIAGNOSIS — M25562 Pain in left knee: Secondary | ICD-10-CM

## 2020-11-19 DIAGNOSIS — M170 Bilateral primary osteoarthritis of knee: Secondary | ICD-10-CM

## 2020-11-23 ENCOUNTER — Encounter: Payer: Self-pay | Admitting: Orthopedic Surgery

## 2020-11-23 NOTE — Progress Notes (Signed)
Office Visit Note   Patient: Deborah [name]           Date of Birth: 1951-02-02           MRN: [id number] Visit Date: 11/19/2020 Requested by: Minette Brine, [place] [address] [place],  [place] [id number] PCP: Minette Brine, FNP  Subjective: Chief Complaint  Patient presents with  . Left Knee - Pain  . Right Knee - Pain    HPI: Deborah [name] is a 69 y.o. female who presents to the office complaining of bilateral knee pain.  Patient had bilateral knee cortisone injections on 02/06/2020 that lasted about 3 to 4 weeks.  She states that she has had the gel injections in the past and they have provided no relief.  She is unable to take any NSAIDs due to GI bleeding that she has had on Aleve.  She has been taking turmeric, CBD, collagen without much relief.  She does have a history of diabetes with last A1c 6.3.  She is somewhat interested in pursuing surgery with the reduced effectiveness of the injections but she states that she does not have any good support system for recovery after surgery..                ROS: All systems reviewed are negative as they relate to the chief complaint within the history of present illness.  Patient denies fevers or chills.  Assessment & Plan: Visit Diagnoses:  1. Pain in both knees, unspecified chronicity   2. Primary osteoarthritis of both knees     Plan: Patient is a 69 year old female presents complaint of bilateral knee pain.  She has a history of bilateral knee osteoarthritis.  She has had last injections on 02/06/2020.  He is provided about 1 month of relief.  Gel injections provide no relief.  She requests bilateral knee injections today.  Also discussed total knee arthroplasty in detail today including the risks and benefits of the procedure with nerve/vessel damage, knee stiffness, knee instability, component loosening, prosthetic joint infection.  Also discussed the recovery timeline and the intensive rehab process.  She  states that she is not ready for surgery at this time and she feels she does not have a good enough support system for recovering from surgery.  Administered bilateral knee injections today and patient tolerated the procedure well.  Follow-up as needed.  Follow-Up Instructions: No follow-ups on file.   Orders:  Orders Placed This Encounter  Procedures  . XR Knee 1-2 Views Right  . XR KNEE 3 VIEW LEFT   No orders of the defined types were placed in this encounter.     Procedures: Large Joint Inj: bilateral knee on 11/26/2020 12:19 PM Indications: diagnostic evaluation, joint swelling and pain Details: 18 G 1.5 in needle, superolateral approach  Arthrogram: No  Medications (Right): 5 mL lidocaine 1 %; 4 mL bupivacaine 0.25 %; 40 mg methylPREDNISolone acetate 40 MG/ML Medications (Left): 5 mL lidocaine 1 %; 4 mL bupivacaine 0.25 %; 40 mg methylPREDNISolone acetate 40 MG/ML Outcome: tolerated well, no immediate complications Procedure, treatment alternatives, risks and benefits explained, specific risks discussed. Consent was given by the patient. Immediately prior to procedure a time out was called to verify the correct patient, procedure, equipment, support staff and site/side marked as required. Patient was prepped and draped in the usual sterile fashion.       Clinical Data: No additional findings.  Objective: Vital Signs: There were no vitals taken for this visit.  Physical Exam:  Constitutional: Patient appears well-developed HEENT:  Head: Normocephalic Eyes:EOM are normal Neck: Normal range of motion Cardiovascular: Normal rate Pulmonary/chest: Effort normal Neurologic: Patient is alert Skin: Skin is warm Psychiatric: Patient has normal mood and affect  Ortho Exam: Ortho exam demonstrates bilateral knees with mild effusion and no flexion contracture.  No pain with passive range of motion of the bilateral knees.  Tenderness over the medial and lateral joint lines  bilaterally.  No calf tenderness bilaterally.  No pain with hip range of motion.  Specialty Comments:  No specialty comments available.  Imaging: No results found.   PMFS History: Patient Active Problem List   Diagnosis Date Noted  . Insomnia 04/30/2019  . Seasonal allergies 03/27/2019  . Prediabetes 01/11/2019  . Hypothyroidism 01/11/2019  . Vitamin D deficiency 01/11/2019  . Cough 01/11/2019  . Fatigue 01/11/2019  . Type 2 diabetes mellitus ([place]) 09/16/2018  . High blood pressure 06/08/2011  . SOB (shortness of breath) 06/08/2011  . Generalized headaches 06/08/2011  . Weight loss 06/08/2011  . Menopause 06/08/2011   Past Medical History:  Diagnosis Date  . Death of child 56   Due to cord strangulation.  . Diabetes mellitus without complication ([place])   . Hypertension     Family History  Problem Relation Age of Onset  . COPD Mother   . Diabetes Mother   . Hypertension Mother   . Heart disease Father   . Hypertension Father   . Glaucoma Father     Past Surgical History:  Procedure Laterality Date  . APPENDECTOMY  1974  . CARDIAC CATHETERIZATION     two, dates not provided.  . CHOLECYSTECTOMY     confirm date with patient, was it 36?  Marland Kitchen LAPAROSCOPIC TUBAL LIGATION  1987   Social History   Occupational History  . Occupation: retired  Tobacco Use  . Smoking status: Never Smoker  . Smokeless tobacco: Never Used  Vaping Use  . Vaping Use: Never used  Substance and Sexual Activity  . Alcohol use: Not Currently  . Drug use: No  . Sexual activity: Not Currently

## 2020-11-26 ENCOUNTER — Encounter: Payer: Self-pay | Admitting: Orthopedic Surgery

## 2020-11-26 DIAGNOSIS — M170 Bilateral primary osteoarthritis of knee: Secondary | ICD-10-CM | POA: Diagnosis not present

## 2020-11-26 DIAGNOSIS — H43813 Vitreous degeneration, bilateral: Secondary | ICD-10-CM | POA: Diagnosis not present

## 2020-11-26 DIAGNOSIS — M25561 Pain in right knee: Secondary | ICD-10-CM | POA: Diagnosis not present

## 2020-11-26 DIAGNOSIS — H401131 Primary open-angle glaucoma, bilateral, mild stage: Secondary | ICD-10-CM | POA: Diagnosis not present

## 2020-11-26 DIAGNOSIS — E119 Type 2 diabetes mellitus without complications: Secondary | ICD-10-CM | POA: Diagnosis not present

## 2020-11-26 MED ORDER — LIDOCAINE HCL 1 % IJ SOLN
5.0000 mL | INTRAMUSCULAR | Status: AC | PRN
  Administered 2020-11-26: 5 mL

## 2020-11-26 MED ORDER — METHYLPREDNISOLONE ACETATE 40 MG/ML IJ SUSP
40.0000 mg | INTRAMUSCULAR | Status: AC | PRN
  Administered 2020-11-26: 40 mg via INTRA_ARTICULAR

## 2020-11-26 MED ORDER — BUPIVACAINE HCL 0.25 % IJ SOLN
4.0000 mL | INTRAMUSCULAR | Status: AC | PRN
  Administered 2020-11-26: 4 mL via INTRA_ARTICULAR

## 2020-12-03 ENCOUNTER — Encounter: Payer: Self-pay | Admitting: Nurse Practitioner

## 2020-12-07 ENCOUNTER — Encounter: Payer: Self-pay | Admitting: Nurse Practitioner

## 2020-12-09 ENCOUNTER — Encounter: Payer: Self-pay | Admitting: Nurse Practitioner

## 2020-12-10 ENCOUNTER — Other Ambulatory Visit: Payer: Self-pay

## 2020-12-10 ENCOUNTER — Other Ambulatory Visit: Payer: Self-pay | Admitting: Nurse Practitioner

## 2020-12-10 ENCOUNTER — Ambulatory Visit
Admission: RE | Admit: 2020-12-10 | Discharge: 2020-12-10 | Disposition: A | Discharge status: Home / Self Care | Payer: Medicare Other | Source: Ambulatory Visit | Attending: Nurse Practitioner | Admitting: Nurse Practitioner

## 2020-12-10 DIAGNOSIS — Z780 Asymptomatic menopausal state: Secondary | ICD-10-CM | POA: Diagnosis not present

## 2020-12-10 DIAGNOSIS — E2839 Other primary ovarian failure: Secondary | ICD-10-CM

## 2020-12-10 DIAGNOSIS — R059 Cough, unspecified: Secondary | ICD-10-CM

## 2020-12-10 MED ORDER — GUAIFENESIN-CODEINE 200-10 MG/5ML PO LIQD: 5.0000 mL | Freq: Three times a day (TID) | ORAL | 0 refills | Status: DC | PRN

## 2020-12-10 MED ORDER — HYDROCODONE-HOMATROPINE 5-1.5 MG/5ML PO SYRP: 5.0000 mL | ORAL_SOLUTION | Freq: Four times a day (QID) | ORAL | 0 refills | Status: DC | PRN

## 2020-12-11 ENCOUNTER — Ambulatory Visit
Admission: RE | Admit: 2020-12-11 | Discharge: 2020-12-11 | Disposition: A | Discharge status: Home / Self Care | Payer: Medicare Other | Source: Ambulatory Visit | Attending: Nurse Practitioner | Admitting: Nurse Practitioner

## 2020-12-11 DIAGNOSIS — Z1231 Encounter for screening mammogram for malignant neoplasm of breast: Secondary | ICD-10-CM | POA: Diagnosis not present

## 2021-01-21 DIAGNOSIS — M25561 Pain in right knee: Secondary | ICD-10-CM | POA: Diagnosis not present

## 2021-01-21 DIAGNOSIS — M1712 Unilateral primary osteoarthritis, left knee: Secondary | ICD-10-CM | POA: Diagnosis not present

## 2021-01-21 DIAGNOSIS — M25562 Pain in left knee: Secondary | ICD-10-CM | POA: Diagnosis not present

## 2021-01-21 DIAGNOSIS — M170 Bilateral primary osteoarthritis of knee: Secondary | ICD-10-CM | POA: Diagnosis not present

## 2021-01-23 ENCOUNTER — Other Ambulatory Visit: Payer: Self-pay | Admitting: Nurse Practitioner

## 2021-01-23 DIAGNOSIS — E119 Type 2 diabetes mellitus without complications: Secondary | ICD-10-CM

## 2021-01-27 ENCOUNTER — Encounter (INDEPENDENT_AMBULATORY_CARE_PROVIDER_SITE_OTHER): Payer: Self-pay | Admitting: Family Medicine

## 2021-01-27 ENCOUNTER — Other Ambulatory Visit: Payer: Self-pay

## 2021-01-27 ENCOUNTER — Ambulatory Visit (INDEPENDENT_AMBULATORY_CARE_PROVIDER_SITE_OTHER): Payer: Medicare Other | Admitting: Family Medicine

## 2021-01-27 VITALS — BP 126/75 | HR 82 | Temp 97.6°F | Ht 64.0 in | Wt 204.0 lb

## 2021-01-27 DIAGNOSIS — Z1331 Encounter for screening for depression: Secondary | ICD-10-CM | POA: Diagnosis not present

## 2021-01-27 DIAGNOSIS — E559 Vitamin D deficiency, unspecified: Secondary | ICD-10-CM | POA: Diagnosis not present

## 2021-01-27 DIAGNOSIS — E1169 Type 2 diabetes mellitus with other specified complication: Secondary | ICD-10-CM

## 2021-01-27 DIAGNOSIS — E7849 Other hyperlipidemia: Secondary | ICD-10-CM

## 2021-01-27 DIAGNOSIS — Z0289 Encounter for other administrative examinations: Secondary | ICD-10-CM

## 2021-01-27 DIAGNOSIS — I10 Essential (primary) hypertension: Secondary | ICD-10-CM | POA: Diagnosis not present

## 2021-01-27 DIAGNOSIS — R0602 Shortness of breath: Secondary | ICD-10-CM

## 2021-01-27 DIAGNOSIS — E538 Deficiency of other specified B group vitamins: Secondary | ICD-10-CM | POA: Diagnosis not present

## 2021-01-27 DIAGNOSIS — E039 Hypothyroidism, unspecified: Secondary | ICD-10-CM

## 2021-01-27 DIAGNOSIS — Z6835 Body mass index (BMI) 35.0-35.9, adult: Secondary | ICD-10-CM

## 2021-01-27 DIAGNOSIS — E785 Hyperlipidemia, unspecified: Secondary | ICD-10-CM

## 2021-01-27 DIAGNOSIS — R5383 Other fatigue: Secondary | ICD-10-CM

## 2021-01-28 ENCOUNTER — Encounter: Payer: Self-pay | Admitting: Nurse Practitioner

## 2021-01-28 ENCOUNTER — Encounter (INDEPENDENT_AMBULATORY_CARE_PROVIDER_SITE_OTHER): Payer: Self-pay | Admitting: Family Medicine

## 2021-01-28 DIAGNOSIS — M25562 Pain in left knee: Secondary | ICD-10-CM | POA: Diagnosis not present

## 2021-01-28 DIAGNOSIS — M1712 Unilateral primary osteoarthritis, left knee: Secondary | ICD-10-CM | POA: Diagnosis not present

## 2021-01-28 LAB — COMPREHENSIVE METABOLIC PANEL
ALT: 16 IU/L (ref 0–32)
AST: 18 IU/L (ref 0–40)
Albumin/Globulin Ratio: 1.7 (ref 1.2–2.2)
Albumin: 4.5 g/dL (ref 3.8–4.8)
Alkaline Phosphatase: 100 IU/L (ref 44–121)
BUN/Creatinine Ratio: 25 (ref 12–28)
BUN: 20 mg/dL (ref 8–27)
Bilirubin Total: 0.6 mg/dL (ref 0.0–1.2)
CO2: 26 mmol/L (ref 20–29)
Calcium: 9.7 mg/dL (ref 8.7–10.3)
Chloride: 96 mmol/L (ref 96–106)
Creatinine, Ser: 0.81 mg/dL (ref 0.57–1.00)
GFR calc Af Amer: 86 mL/min/{1.73_m2} (ref >59)
GFR calc non Af Amer: 75 mL/min/{1.73_m2} (ref >59)
Globulin, Total: 2.6 g/dL (ref 1.5–4.5)
Glucose: 94 mg/dL (ref 65–99)
Potassium: 4.2 mmol/L (ref 3.5–5.2)
Sodium: 135 mmol/L (ref 134–144)
Total Protein: 7.1 g/dL (ref 6.0–8.5)

## 2021-01-28 LAB — CBC WITH DIFFERENTIAL/PLATELET
Basophils Absolute: 0 10*3/uL (ref 0.0–0.2)
Basos: 1 %
EOS (ABSOLUTE): 0.1 10*3/uL (ref 0.0–0.4)
Eos: 2 %
Hematocrit: 38 % (ref 34.0–46.6)
Hemoglobin: 12.4 g/dL (ref 11.1–15.9)
Immature Grans (Abs): 0 10*3/uL (ref 0.0–0.1)
Immature Granulocytes: 0 %
Lymphocytes Absolute: 2.3 10*3/uL (ref 0.7–3.1)
Lymphs: 35 %
MCH: 28.6 pg (ref 26.6–33.0)
MCHC: 32.6 g/dL (ref 31.5–35.7)
MCV: 88 fL (ref 79–97)
Monocytes Absolute: 0.5 10*3/uL (ref 0.1–0.9)
Monocytes: 8 %
Neutrophils Absolute: 3.5 10*3/uL (ref 1.4–7.0)
Neutrophils: 54 %
Platelets: 453 10*3/uL — ABNORMAL HIGH (ref 150–450)
RBC: 4.34 x10E6/uL (ref 3.77–5.28)
RDW: 12.9 % (ref 11.7–15.4)
WBC: 6.4 10*3/uL (ref 3.4–10.8)

## 2021-01-28 LAB — HEMOGLOBIN A1C
Est. average glucose Bld gHb Est-mCnc: 128 mg/dL
Hgb A1c MFr Bld: 6.1 % — ABNORMAL HIGH (ref 4.8–5.6)

## 2021-01-28 LAB — T3: T3, Total: 104 ng/dL (ref 71–180)

## 2021-01-28 LAB — INSULIN, RANDOM: INSULIN: 31.4 u[IU]/mL — ABNORMAL HIGH (ref 2.6–24.9)

## 2021-01-28 LAB — TSH: TSH: 4.4 u[IU]/mL (ref 0.450–4.500)

## 2021-01-28 LAB — FOLATE: Folate: >20 ng/mL (ref >3.0)

## 2021-01-28 LAB — LIPID PANEL WITH LDL/HDL RATIO
Cholesterol, Total: 182 mg/dL (ref 100–199)
HDL: 53 mg/dL (ref >39)
LDL Chol Calc (NIH): 114 mg/dL — ABNORMAL HIGH (ref 0–99)
LDL/HDL Ratio: 2.2 ratio (ref 0.0–3.2)
Triglycerides: 84 mg/dL (ref 0–149)
VLDL Cholesterol Cal: 15 mg/dL (ref 5–40)

## 2021-01-28 LAB — VITAMIN D 25 HYDROXY (VIT D DEFICIENCY, FRACTURES): Vit D, 25-Hydroxy: 50.5 ng/mL (ref 30.0–100.0)

## 2021-01-28 LAB — VITAMIN B12: Vitamin B-12: >2000 pg/mL — ABNORMAL HIGH (ref 232–1245)

## 2021-01-28 LAB — T4: T4, Total: 10.4 ug/dL (ref 4.5–12.0)

## 2021-01-28 NOTE — Progress Notes (Signed)
Chief Complaint:   OBESITY Deborah [name] (MR# [id number]) is a 70 y.o. female who presents for evaluation and treatment of obesity and related comorbidities. Current BMI is Body mass index is 35.02 kg/m. Deborah [name] has been struggling with her weight for many years and has been unsuccessful in either losing weight, maintaining weight loss, or reaching her healthy weight goal.  Deborah [name] is currently in the action stage of change and ready to dedicate time achieving and maintaining a healthier weight. Deborah [name] is interested in becoming our patient and working on intensive lifestyle modifications including (but not limited to) diet and exercise for weight loss.  Deborah [name]'s habits were reviewed today and are as follows: her desired weight loss is 24 lbs, she started gaining weight in her senior years, her heaviest weight ever was 211 pounds, she has significant food cravings issues and she frequently makes poor food choices.  Depression Screen Deborah [name]'s Food and Mood (modified PHQ-9) score was 9.  Depression screen PHQ 2/9 01/27/2021  Decreased Interest 1  Down, Depressed, Hopeless 1  PHQ - 2 Score 2  Altered sleeping 2  Tired, decreased energy 3  Change in appetite 1  Feeling bad or failure about yourself  0  Trouble concentrating 0  Moving slowly or fidgety/restless 1  Suicidal thoughts 0  PHQ-9 Score 9  Difficult doing work/chores Somewhat difficult   Subjective:   1. Other fatigue Deborah [name] admits to daytime somnolence and admits to waking up still tired. Patent has a history of symptoms of daytime fatigue and morning headache. Deborah [name] generally gets 6 or 7 hours of sleep per night, and states that she has difficulty falling asleep. Snoring is not present. Apneic episodes are not present. Epworth Sleepiness Score is 4.  2. Shortness of breath on exertion [name] notes increasing shortness of breath with exercising and seems to be worsening over time with weight gain. She  notes getting out of breath sooner with activity than she used to. This has not gotten worse recently. Deborah [name] denies shortness of breath at rest or orthopnea.  3. Type 2 diabetes mellitus with other specified complication, without long-term current use of insulin (HCC) Deborah [name] is on Amaryl for blood sugar control. She did not bring her BGs log in today.   4. Essential hypertension Deborah [name] is hydrochlorothiazide, hydralazine, and spironolactone.    5. Other hyperlipidemia Deborah [name] is on Crestor, and she is ready to work on diet and weight loss.  6. Vitamin D deficiency Deborah [name] is at risk of Vit D deficiency. She has no recent labs, and she notes fatigue.  7. B12 deficiency Deborah [name] is at risk of B12 deficiency due to diabetes mellitus. She has no recent labs.  8. Hypothyroidism, unspecified type [name] is on Synthroid 100 mcg. She has no recent labs and she notes fatigue.  Assessment/Plan:   1. Other fatigue Deborah [name] does feel that her weight is causing her energy to be lower than it should be. Fatigue may be related to obesity, depression or many other causes. Labs will be ordered, and in the meanwhile, [name] will focus on self care including making healthy food choices, increasing physical activity and focusing on stress reduction.  - CBC with Differential/Platelet - EKG 12-Lead  2. Shortness of breath on exertion Deborah [name] does feel that she gets out of breath more easily that she used to when she exercises. Deborah [name]'s shortness of breath appears to be obesity related and exercise induced. She has agreed to work on weight loss and gradually  increase exercise to treat her exercise induced shortness of breath. Will continue to monitor closely.  3. Type 2 diabetes mellitus with other specified complication, without long-term current use of insulin (HCC) Good blood sugar control is important to decrease the likelihood of diabetic complications such as nephropathy, neuropathy, limb loss,  blindness, coronary artery disease, and death. Intensive lifestyle modification including diet, exercise and weight loss are the first line of treatment for diabetes. We will check labs today. Deborah [name] will start her Category 2 plan, and will follow up as directed.  - Hemoglobin A1c - Insulin, random  4. Essential hypertension We will check labs today. Deborah [name] will start Category 2 plan, and will work on healthy weight loss and exercise to improve blood pressure control. We will watch for signs of hypotension as she continues her lifestyle modifications.  - Comprehensive metabolic panel  5. Other hyperlipidemia Cardiovascular risk and specific lipid/LDL goals reviewed. We discussed several lifestyle modifications today. We will check labs today. Deborah [name] will start her Category 2 plan, and will work on exercise and weight loss efforts. Orders and follow up as documented in patient record.   - Lipid Panel With LDL/HDL Ratio  6. Vitamin D deficiency Low Vitamin D level contributes to fatigue and are associated with obesity, breast, and colon cancer. We will check labs today. Deborah [name] will follow-up for routine testing of Vitamin D, at least 2-3 times per year to avoid over-replacement.  - VITAMIN D 25 Hydroxy (Vit-D Deficiency, Fractures)  7. B12 deficiency The diagnosis was reviewed with the patient. We will check labs today. Deborah [name] will start her B12 rich diet. Orders and follow up as documented in patient record.  - Vitamin B12 - Folate  8. Hypothyroidism, unspecified type Patient with long-standing hypothyroidism. We will check labs today, and Deborah [name] will follow up as directed. Orders and follow up as documented in patient record.  - T3 - T4 - TSH  9. Screening for depression Deborah [name] had a positive depression screening. Depression is commonly associated with obesity and often results in emotional eating behaviors. We will monitor this closely and work on CBT to help improve the  non-hunger eating patterns. Referral to Psychology may be required if no improvement is seen as she continues in our clinic.  10. Class 2 severe obesity with serious comorbidity and body mass index (BMI) of 35.0 to 35.9 in adult, unspecified obesity type [facility]) Leyah is currently in the action stage of change and her goal is to continue with weight loss efforts. I recommend Cyleigh begin the structured treatment plan as follows:  She has agreed to the Category 2 Plan.  Exercise goals: No exercise has been prescribed for now while we concentrate on nutritional changes.   Behavioral modification strategies: increasing lean protein intake and no skipping meals.  She was informed of the importance of frequent follow-up visits to maximize her success with intensive lifestyle modifications for her multiple health conditions. She was informed we would discuss her lab results at her next visit unless there is a critical issue that needs to be addressed sooner. Charleen agreed to keep her next visit at the agreed upon time to discuss these results.  Objective:   Blood pressure 126/75, pulse 82, temperature 97.6 F (36.4 C), height 5\' 4"  (1.626 m), weight 204 lb (92.5 kg), SpO2 97 %. Body mass index is 35.02 kg/m.  EKG: Normal sinus rhythm, rate 82 BPM.  Indirect Calorimeter completed today shows a VO2 of 208 and a REE of 1445.  Her calculated basal metabolic rate is 7124 thus her basal metabolic rate is worse than expected.  General: Cooperative, alert, well developed, in no acute distress. HEENT: Conjunctivae and lids unremarkable. Cardiovascular: Regular rhythm.  Lungs: Normal work of breathing. Neurologic: No focal deficits.   Lab Results  Component Value Date   CREATININE 0.81 01/27/2021   BUN 20 01/27/2021   NA 135 01/27/2021   K 4.2 01/27/2021   CL 96 01/27/2021   CO2 26 01/27/2021   Lab Results  Component Value Date   ALT 16 01/27/2021   AST 18 01/27/2021   ALKPHOS 100  01/27/2021   BILITOT 0.6 01/27/2021   Lab Results  Component Value Date   HGBA1C 6.1 (H) 01/27/2021   HGBA1C 6.3 (H) 10/21/2020   HGBA1C 6.2 (H) 08/18/2020   HGBA1C 6.1 (H) 04/17/2020   HGBA1C 5.7 (H) 10/17/2019   Lab Results  Component Value Date   INSULIN 31.4 (H) 01/27/2021   Lab Results  Component Value Date   TSH 4.400 01/27/2021   Lab Results  Component Value Date   CHOL 182 01/27/2021   HDL 53 01/27/2021   LDLCALC 114 (H) 01/27/2021   TRIG 84 01/27/2021   CHOLHDL 3.2 10/21/2020   Lab Results  Component Value Date   WBC 6.4 01/27/2021   HGB 12.4 01/27/2021   HCT 38.0 01/27/2021   MCV 88 01/27/2021   PLT 453 (H) 01/27/2021   No results found for: IRON, TIBC, FERRITIN Obesity Behavioral Intervention:   Approximately 15 minutes were spent on the discussion below.  ASK: We discussed the diagnosis of obesity with [name] today and Karalee agreed to give [place] permission to discuss obesity behavioral modification therapy today.  ASSESS: Zeniyah has the diagnosis of obesity and her BMI today is 35. Millette is in the action stage of change.   ADVISE: Jaleeah was educated on the multiple health risks of obesity as well as the benefit of weight loss to improve her health. She was advised of the need for long term treatment and the importance of lifestyle modifications to improve her current health and to decrease her risk of future health problems.  AGREE: Multiple dietary modification options and treatment options were discussed and Kearra agreed to follow the recommendations documented in the above note.  ARRANGE: Odesser was educated on the importance of frequent visits to treat obesity as outlined per CMS and USPSTF guidelines and agreed to schedule her next follow up appointment today.  Attestation Statements:   Reviewed by clinician on day of visit: allergies, medications, problem list, medical history, surgical history, family history, social history, and  previous encounter notes.   I, Trixie Dredge, am acting as transcriptionist for Dennard Nip, MD.  I have reviewed the above documentation for accuracy and completeness, and I agree with the above. - Dennard Nip, MD

## 2021-02-04 DIAGNOSIS — M1712 Unilateral primary osteoarthritis, left knee: Secondary | ICD-10-CM | POA: Diagnosis not present

## 2021-02-04 DIAGNOSIS — M25562 Pain in left knee: Secondary | ICD-10-CM | POA: Diagnosis not present

## 2021-02-10 ENCOUNTER — Ambulatory Visit (INDEPENDENT_AMBULATORY_CARE_PROVIDER_SITE_OTHER): Payer: Medicare Other | Admitting: Family Medicine

## 2021-02-10 ENCOUNTER — Other Ambulatory Visit: Payer: Self-pay

## 2021-02-10 ENCOUNTER — Encounter (INDEPENDENT_AMBULATORY_CARE_PROVIDER_SITE_OTHER): Payer: Self-pay | Admitting: Family Medicine

## 2021-02-10 VITALS — BP 116/71 | HR 73 | Temp 97.9°F | Ht 64.0 in | Wt 201.0 lb

## 2021-02-10 DIAGNOSIS — E559 Vitamin D deficiency, unspecified: Secondary | ICD-10-CM

## 2021-02-10 DIAGNOSIS — E669 Obesity, unspecified: Secondary | ICD-10-CM

## 2021-02-10 DIAGNOSIS — E1169 Type 2 diabetes mellitus with other specified complication: Secondary | ICD-10-CM

## 2021-02-10 DIAGNOSIS — Z6834 Body mass index (BMI) 34.0-34.9, adult: Secondary | ICD-10-CM | POA: Diagnosis not present

## 2021-02-10 DIAGNOSIS — R748 Abnormal levels of other serum enzymes: Secondary | ICD-10-CM

## 2021-02-11 NOTE — Progress Notes (Signed)
Chief Complaint:   OBESITY Deborah [name] is here to discuss her progress with her obesity treatment plan along with follow-up of her obesity related diagnoses. Deborah [name] is on the Category 2 Plan and states she is following her eating plan approximately 85% of the time. Deborah [name] states she is doing 0 minutes 0 times per week.  Today's visit was #: 2 Starting weight: 204 lbs Starting date: 01/27/2021 Today's weight: 201 lbs Today's date: 02/10/2021 Total lbs lost to date: 3 Total lbs lost since last in-office visit: 3  Interim History: Deborah [name] struggled to follow her plan closely. She didn't like some of the options and making meals were boring. She also noted increased hunger which made things difficult.  Subjective:   1. Type 2 diabetes mellitus with other specified complication, without long-term current use of insulin (HCC) Deborah [name]'s A1c is controlled at 6.1. she is on Amaryl only and her fasting insulin was elevated which caused her polyphagia. She doesn't want to be on metformin due to the potential side effects of GI upset. I discussed labs with the patient today.  2. Elevated vitamin B12 level Deborah [name] is on B12 injections and her level was supratherapeutic. She was hoping to improve her fatigue but she stopped B12 after she saw the lab results in Deborah [name]. I discussed labs with the patient today.  3. Vitamin D deficiency Deborah [name] is on Vit D OTC and her Vit D level is at goal. She denies nausea, vomiting, or muscle weakness. I discussed labs with the patient today.  Assessment/Plan:   1. Type 2 diabetes mellitus with other specified complication, without long-term current use of insulin (HCC) Good blood sugar control is important to decrease the likelihood of diabetic complications such as nephropathy, neuropathy, limb loss, blindness, coronary artery disease, and death. Intensive lifestyle modification including diet, exercise and weight loss are the first line of treatment for  diabetes. Deborah [name] was encouraged to continue with diet and weight loss. Her goal will be to control with other medications or no medications.  2. Elevated vitamin B12 level The diagnosis was reviewed with the patient. Deborah [name] agreed to hold B12 for now, and we will recheck labs in 3 months. Orders and follow up as documented in patient record.  3. Vitamin D deficiency Low Vitamin D level contributes to fatigue and are associated with obesity, breast, and colon cancer. Deborah [name] agreed to continue taking OTC Vitamin D and will follow-up for routine testing of Vitamin D, at least 2-3 times per year to avoid over-replacement.  4. Class 1 obesity with serious comorbidity and body mass index (BMI) of 34.0 to 34.9 in adult, unspecified obesity type Deborah [name] is currently in the action stage of change. As such, her goal is to continue with weight loss efforts. She has agreed to change to keeping a food journal and adhering to recommended goals of 1100-1300 calories and 75+ grams of protein daily.   Exercise goals: As is.  Behavioral modification strategies: increasing lean protein intake and meal planning and cooking strategies.  Deborah [name] has agreed to follow-up with our clinic in 2 weeks. She was informed of the importance of frequent follow-up visits to maximize her success with intensive lifestyle modifications for her multiple health conditions.   Objective:   Blood pressure 116/71, pulse 73, temperature 97.9 F (36.6 C), height 5\' 4"  (1.626 m), weight 201 lb (91.2 kg), SpO2 96 %. Body mass index is 34.5 kg/m.  General: Cooperative, alert, well developed, in no acute distress. HEENT: Conjunctivae and  lids unremarkable. Cardiovascular: Regular rhythm.  Lungs: Normal work of breathing. Neurologic: No focal deficits.   Lab Results  Component Value Date   CREATININE 0.81 01/27/2021   BUN 20 01/27/2021   NA 135 01/27/2021   K 4.2 01/27/2021   CL 96 01/27/2021   CO2 26 01/27/2021   Lab  Results  Component Value Date   ALT 16 01/27/2021   AST 18 01/27/2021   ALKPHOS 100 01/27/2021   BILITOT 0.6 01/27/2021   Lab Results  Component Value Date   HGBA1C 6.1 (H) 01/27/2021   HGBA1C 6.3 (H) 10/21/2020   HGBA1C 6.2 (H) 08/18/2020   HGBA1C 6.1 (H) 04/17/2020   HGBA1C 5.7 (H) 10/17/2019   Lab Results  Component Value Date   INSULIN 31.4 (H) 01/27/2021   Lab Results  Component Value Date   TSH 4.400 01/27/2021   Lab Results  Component Value Date   CHOL 182 01/27/2021   HDL 53 01/27/2021   LDLCALC 114 (H) 01/27/2021   TRIG 84 01/27/2021   CHOLHDL 3.2 10/21/2020   Lab Results  Component Value Date   WBC 6.4 01/27/2021   HGB 12.4 01/27/2021   HCT 38.0 01/27/2021   MCV 88 01/27/2021   PLT 453 (H) 01/27/2021   No results found for: IRON, TIBC, FERRITIN  Obesity Behavioral Intervention:   Approximately 15 minutes were spent on the discussion below.  ASK: We discussed the diagnosis of obesity with Deborah [name] today and Deborah [name] agreed to give [place] permission to discuss obesity behavioral modification therapy today.  ASSESS: Deborah [name] has the diagnosis of obesity and her BMI today is 34.48. Deborah [name] is in the action stage of change.   ADVISE: Deborah [name] was educated on the multiple health risks of obesity as well as the benefit of weight loss to improve her health. She was advised of the need for long term treatment and the importance of lifestyle modifications to improve her current health and to decrease her risk of future health problems.  AGREE: Multiple dietary modification options and treatment options were discussed and Deborah [name] agreed to follow the recommendations documented in the above note.  ARRANGE: Deborah [name] was educated on the importance of frequent visits to treat obesity as outlined per CMS and USPSTF guidelines and agreed to schedule her next follow up appointment today.  Attestation Statements:   Reviewed by clinician on day of visit: allergies,  medications, problem list, medical history, surgical history, family history, social history, and previous encounter notes.   I, Trixie Dredge, am acting as transcriptionist for Dennard Nip, MD.  I have reviewed the above documentation for accuracy and completeness, and I agree with the above. -  Dennard Nip, MD

## 2021-02-18 ENCOUNTER — Other Ambulatory Visit: Payer: Self-pay

## 2021-02-18 ENCOUNTER — Ambulatory Visit (INDEPENDENT_AMBULATORY_CARE_PROVIDER_SITE_OTHER): Payer: Medicare Other | Admitting: Nurse Practitioner

## 2021-02-18 ENCOUNTER — Encounter: Payer: Self-pay | Admitting: Nurse Practitioner

## 2021-02-18 VITALS — BP 142/80 | HR 65 | Temp 98.3°F | Ht 64.0 in | Wt 204.0 lb

## 2021-02-18 DIAGNOSIS — E559 Vitamin D deficiency, unspecified: Secondary | ICD-10-CM | POA: Diagnosis not present

## 2021-02-18 DIAGNOSIS — E119 Type 2 diabetes mellitus without complications: Secondary | ICD-10-CM | POA: Diagnosis not present

## 2021-02-18 DIAGNOSIS — Z636 Dependent relative needing care at home: Secondary | ICD-10-CM | POA: Diagnosis not present

## 2021-02-18 DIAGNOSIS — E039 Hypothyroidism, unspecified: Secondary | ICD-10-CM | POA: Diagnosis not present

## 2021-02-18 DIAGNOSIS — I10 Essential (primary) hypertension: Secondary | ICD-10-CM | POA: Diagnosis not present

## 2021-02-18 MED ORDER — METFORMIN HCL ER 500 MG PO TB24: 500.0000 mg | ORAL_TABLET | Freq: Every day | ORAL | 2 refills | Status: DC

## 2021-02-18 NOTE — Progress Notes (Signed)
I,Yamilka Roman [employer] as a Education administrator for [employer], FNP.,have documented all relevant documentation on the behalf of Minette Brine, FNP,as directed by  Minette Brine, FNP while in the presence of Minette Brine, [place]. This visit occurred during the SARS-CoV-2 public health emergency.  Safety protocols were in place, including screening questions prior to the visit, additional usage of staff PPE, and extensive cleaning of exam room while observing appropriate contact time as indicated for disinfecting solutions.  Subjective:     Patient ID: Deborah [name] , female    DOB: 10-15-1951 , 70 y.o.   MRN: [id number]   Chief Complaint  Patient presents with  . Hypertension  . Diabetes    HPI  Patient presents today for a f/u on her diabetes and blood pressure. She drank 2 cups of caffeinated coffee prior to her office visit.    Wt Readings from Last 3 Encounters: 02/18/21 : 204 lb (92.5 kg) 02/10/21 : 201 lb (91.2 kg) 01/27/21 : 204 lb (92.5 kg)  Hypertension This is a chronic problem. The current episode started more than 1 year ago. The problem has been gradually improving since onset. The problem is controlled. Pertinent negatives include no blurred vision, chest pain, headaches, malaise/fatigue, palpitations or peripheral edema. There are no associated agents to hypertension. Risk factors for coronary artery disease include dyslipidemia, diabetes mellitus, obesity and sedentary lifestyle. Past treatments include diuretics. The current treatment provides moderate improvement. Compliance problems include exercise.  There is no history of angina. There is no history of chronic renal disease.  Diabetes She presents for her follow-up diabetic visit. Diabetes type: prediabetes. Her disease course has been stable. There are no hypoglycemic associated symptoms. Pertinent negatives for hypoglycemia include no dizziness or headaches. There are no diabetic associated symptoms. Pertinent  negatives for diabetes include no blurred vision, no chest pain, no polydipsia, no polyphagia and no polyuria. There are no hypoglycemic complications. Symptoms are stable. There are no diabetic complications. Risk factors for coronary artery disease include diabetes mellitus, obesity and sedentary lifestyle. She is compliant with treatment most of the time. Her weight is stable. She is following a generally unhealthy diet. When asked about meal planning, she reported none. She has not had a previous visit with a dietitian. She rarely participates in exercise. There is no change in her home blood glucose trend. 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 She does not see a podiatrist.Eye exam is current (08/23/2019).     Past Medical History:  Diagnosis Date  . Back pain   . Death of child 83   Due to cord strangulation.  . Diabetes mellitus without complication ([place])   . Hyperlipidemia   . Hypertension   . Hypothyroidism   . Joint pain   . Osteoarthritis   . Other fatigue   . Shortness of breath on exertion   . Thyroid condition      Family History  Problem Relation Age of Onset  . COPD Mother   . Diabetes Mother   . Hypertension Mother   . Obesity Mother   . Heart disease Father   . Hypertension Father   . Glaucoma Father      Current Outpatient Medications:  .  acetaminophen (TYLENOL) 650 MG CR tablet, Take 650 mg by mouth at bedtime., Disp: , Rfl:  .  Blood Glucose Monitoring Suppl [facility]  BLOOD GLUCOSE METER) w/Device KIT, Check blood sugars twice daily. Please provide patient with meter and supplies that is covered by pt ins., Disp: 1 kit, Rfl: 0 .  Blood Glucose Monitoring Suppl (ONE TOUCH ULTRA MINI) w/Device KIT, Use to check blood sugars 3 times a day. Dx code:e11.65, Disp: 1 kit, Rfl: 0 .  cholecalciferol (VITAMIN D) 1000  units tablet, Take 2,000 Units by mouth daily., Disp: , Rfl:  .  cyclobenzaprine (FLEXERIL) 10 MG tablet, Take 1 tablet (10 mg total) by mouth 3 (three) times daily as needed for muscle spasms., Disp: 30 tablet, Rfl: 1 .  docusate sodium (COLACE) 100 MG capsule, Take 100 mg by mouth daily., Disp: , Rfl:  .  guaiFENesin-Codeine 200-10 MG/5ML LIQD, Take 5 mLs by mouth 3 (three) times daily as needed., Disp: 210 mL, Rfl: 0 .  hydrALAZINE (APRESOLINE) 100 MG tablet, Take 1 tablet (100 mg total) by mouth 2 (two) times daily., Disp: 180 tablet, Rfl: 1 .  hydrochlorothiazide (HYDRODIURIL) 25 MG tablet, Take 1 tablet by mouth daily, Disp: 90 tablet, Rfl: 2 .  MAGNESIUM PO, Take 375 mg by mouth daily., Disp: , Rfl:  .  melatonin 5 MG TABS, Take 5 mg by mouth at bedtime., Disp: , Rfl:  .  metFORMIN (GLUCOPHAGE XR) 500 MG 24 hr tablet, Take 1 tablet (500 mg total) by mouth daily with breakfast., Disp: 30 tablet, Rfl: 2 .  ONETOUCH ULTRA test strip, USE WITH METER TO CHECK  BLOOD SUGAR TWO TIMES  DAILY, BEFORE BREAKFAST AND BEFORE DINNER, Disp: 200 strip, Rfl: 3 .  rosuvastatin (CRESTOR) 5 MG tablet, TAKE 1 TABLET BY MOUTH  DAILY ON MONDAY, WEDNESDAY, AND FRIDAY, Disp: 39 tablet, Rfl: 3 .  spironolactone (ALDACTONE) 25 MG tablet, Take 25 mg by mouth daily., Disp: , Rfl:  .  SYNTHROID 100 MCG tablet, TAKE 1 TABLET BY MOUTH DAILY BEFORE BREAKFAST., Disp: 90 tablet, Rfl: 1 .  Turmeric 500 MG CAPS, Take 1 tablet by mouth as needed (Takes occassionally)., Disp: , Rfl:    Allergies  Allergen Reactions  . Amlodipine     headache  . Aspartame And Phenylalanine Other (See Comments)    Causes migraines  . Bidil [Isosorb Dinitrate-Hydralazine]     headache  . Erythromycin     Ask patient to specify type/severity/reaction. Not indicated on medical history form dated 11/04/10.  . Ibuprofen     Ask patient to specify type/severity/reaction. Not noted on medical history form dated 11/04/10.  . Phenylalanine Other  (See Comments)    Causes migraine  . Prilosec [Omeprazole] Nausea And Vomiting  . Tramadol Nausea And Vomiting  . Valsartan Cough  . Zantac [Ranitidine Hcl]      Review of Systems  Constitutional: Negative.  Negative for malaise/fatigue.  HENT: Negative.   Eyes: Negative.  Negative for blurred vision.  Respiratory: Negative.   Cardiovascular: Negative.  Negative for chest pain, palpitations and leg swelling.  Gastrointestinal: Negative.   Endocrine: Negative for polydipsia, polyphagia and polyuria.  Genitourinary: Negative.   Musculoskeletal: Negative.   Skin: Negative.   Neurological: Negative.  Negative for dizziness and headaches.  Hematological: Negative.   Psychiatric/Behavioral: Negative.      Today's Vitals   02/18/21 0945  BP: (!) 142/80  Pulse: 65  Temp: 98.3 F (36.8 C)  Weight: 204 lb (92.5 kg)  Height: 5' 4" (1.626 m)  PainSc: 0-No pain   Body mass index is 35.02 kg/m.   Objective:  Physical Exam Constitutional:        General: She is not in acute distress.    Appearance: Normal appearance. She is obese.  Cardiovascular:     Rate and Rhythm: Normal rate and regular rhythm.     Pulses: Normal pulses.     Heart sounds: Normal heart sounds.  Pulmonary:     Effort: Pulmonary effort is normal.     Breath sounds: Normal breath sounds.  Abdominal:     General: Abdomen is flat. Bowel sounds are normal.     Palpations: Abdomen is soft.  Musculoskeletal:        General: Normal range of motion.     Cervical back: Normal range of motion and neck supple.  Skin:    General: Skin is warm and dry.     Capillary Refill: Capillary refill takes less than 2 seconds.  Neurological:     General: No focal deficit present.     Mental Status: She is alert and oriented to person, place, and time.     Cranial Nerves: No cranial nerve deficit.  Psychiatric:        Mood and Affect: Mood normal.        Behavior: Behavior normal.        Thought Content: Thought content  normal.        Judgment: Judgment normal.     Comments: Tearful during visit when talking about caring for her husband.          Assessment And Plan:     1. Essential hypertension . B/P is controlled.  . CMP ordered to check renal function.  . The importance of regular exercise and dietary modification was stressed to the patient.  . Stressed importance of losing ten percent of her body weight to help with B/P control.   2. Type 2 diabetes mellitus without complication, without long-term current use of insulin (HCC)  Chronic, controlled  Continue with current medications  Encouraged to limit intake of sugary foods and drinks  Encouraged to increase physical activity to 150 minutes per week  She is going to healthy weight and wellness - metFORMIN (GLUCOPHAGE XR) 500 MG 24 hr tablet; Take 1 tablet (500 mg total) by mouth daily with breakfast.  Dispense: 30 tablet; Refill: 2  3. Acquired hypothyroidism  Chronic, controlled  Continue with current medications  4. Vitamin D deficiency  Will check vitamin D level and supplement as needed.     Also encouraged to spend 15 minutes in the sun daily.   5. 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 This puts a strain on her and notices that she may not be as attentive to her own health needs. I explained to her the importance of self care.   Patient was given opportunity to ask questions. Patient verbalized understanding of the plan and was able to repeat key elements of the plan. All questions were answered to their satisfaction.  Minette Brine, FNP    I, Minette Brine, FNP, have reviewed all documentation for this visit. The documentation on 02/18/21 for the exam, diagnosis, procedures, and orders are all accurate and complete.   THE PATIENT IS ENCOURAGED TO PRACTICE SOCIAL DISTANCING DUE TO THE COVID-19 PANDEMIC.

## 2021-02-18 NOTE — Patient Instructions (Signed)

## 2021-02-18 NOTE — Addendum Note (Signed)
Addended by: Minette Brine F on: 02/18/2021 10:56 AM   Modules accepted: Orders

## 2021-02-19 LAB — CMP14+EGFR
ALT: 20 IU/L (ref 0–32)
AST: 15 IU/L (ref 0–40)
Albumin/Globulin Ratio: 1.5 (ref 1.2–2.2)
Albumin: 4.3 g/dL (ref 3.8–4.8)
Alkaline Phosphatase: 90 IU/L (ref 44–121)
BUN/Creatinine Ratio: 22 (ref 12–28)
BUN: 16 mg/dL (ref 8–27)
Bilirubin Total: 0.4 mg/dL (ref 0.0–1.2)
CO2: 24 mmol/L (ref 20–29)
Calcium: 9.6 mg/dL (ref 8.7–10.3)
Chloride: 96 mmol/L (ref 96–106)
Creatinine, Ser: 0.73 mg/dL (ref 0.57–1.00)
Globulin, Total: 2.8 g/dL (ref 1.5–4.5)
Glucose: 92 mg/dL (ref 65–99)
Potassium: 4.5 mmol/L (ref 3.5–5.2)
Sodium: 135 mmol/L (ref 134–144)
Total Protein: 7.1 g/dL (ref 6.0–8.5)
eGFR: 90 mL/min/{1.73_m2} (ref >59)

## 2021-02-19 LAB — LIPID PANEL
Chol/HDL Ratio: 3.4 ratio (ref 0.0–4.4)
Cholesterol, Total: 168 mg/dL (ref 100–199)
HDL: 49 mg/dL (ref >39)
LDL Chol Calc (NIH): 105 mg/dL — ABNORMAL HIGH (ref 0–99)
Triglycerides: 76 mg/dL (ref 0–149)
VLDL Cholesterol Cal: 14 mg/dL (ref 5–40)

## 2021-02-19 LAB — HEMOGLOBIN A1C
Est. average glucose Bld gHb Est-mCnc: 114 mg/dL
Hgb A1c MFr Bld: 5.6 % (ref 4.8–5.6)

## 2021-02-19 LAB — VITAMIN D 25 HYDROXY (VIT D DEFICIENCY, FRACTURES): Vit D, 25-Hydroxy: 55.1 ng/mL (ref 30.0–100.0)

## 2021-02-23 ENCOUNTER — Other Ambulatory Visit: Payer: Self-pay | Admitting: Nurse Practitioner

## 2021-02-23 DIAGNOSIS — E039 Hypothyroidism, unspecified: Secondary | ICD-10-CM

## 2021-02-25 ENCOUNTER — Ambulatory Visit (INDEPENDENT_AMBULATORY_CARE_PROVIDER_SITE_OTHER): Payer: Medicare Other | Admitting: Physician Assistant

## 2021-02-26 ENCOUNTER — Ambulatory Visit (INDEPENDENT_AMBULATORY_CARE_PROVIDER_SITE_OTHER): Payer: Medicare Other | Admitting: Physician Assistant

## 2021-02-26 ENCOUNTER — Encounter (INDEPENDENT_AMBULATORY_CARE_PROVIDER_SITE_OTHER): Payer: Self-pay | Admitting: Physician Assistant

## 2021-02-26 ENCOUNTER — Other Ambulatory Visit: Payer: Self-pay

## 2021-02-26 VITALS — BP 149/84 | HR 64 | Temp 97.8°F | Ht 64.0 in | Wt 199.0 lb

## 2021-02-26 DIAGNOSIS — Z6834 Body mass index (BMI) 34.0-34.9, adult: Secondary | ICD-10-CM | POA: Diagnosis not present

## 2021-02-26 DIAGNOSIS — E669 Obesity, unspecified: Secondary | ICD-10-CM | POA: Diagnosis not present

## 2021-02-26 DIAGNOSIS — E7849 Other hyperlipidemia: Secondary | ICD-10-CM | POA: Diagnosis not present

## 2021-02-26 DIAGNOSIS — E1169 Type 2 diabetes mellitus with other specified complication: Secondary | ICD-10-CM

## 2021-03-04 NOTE — Progress Notes (Signed)
Chief Complaint:   OBESITY Deborah [name] is here to discuss her progress with her obesity treatment plan along with follow-up of her obesity related diagnoses. Deborah [name] is on keeping a food journal and adhering to recommended goals of 1100-1300 calories and 75+ grams of protein daily and states she is following her eating plan approximately 85% of the time. Deborah [name] states she is doing 0 minutes 0 times per week.  Today's visit was #: 3 Starting weight: 204 lbs Starting date: 01/27/2021 Today's weight: 199 lbs Today's date: 02/26/2021 Total lbs lost to date: 5 Total lbs lost since last in-office visit: 2  Interim History: Deborah [name] is staying between 1100-1300 calories daily, and she is getting around 80-90 grams of protein daily. She is bored with breakfast. She is hungry between breakfast and lunch. She misses fried fish and fried chicken.  Subjective:   1. Type 2 diabetes mellitus with other specified complication, without long-term current use of insulin (HCC) Deborah [name]'s last A1c was 5.6. She is on metformin XR, and she complains of diarrhea.  2. Other hyperlipidemia Deborah [name] is on Crestor, and she denies myalgias.   Assessment/Plan:   1. Type 2 diabetes mellitus with other specified complication, without long-term current use of insulin (HCC) Good blood sugar control is important to decrease the likelihood of diabetic complications such as nephropathy, neuropathy, limb loss, blindness, coronary artery disease, and death. Intensive lifestyle modification including diet, exercise and weight loss are the first line of treatment for diabetes. Deborah [name] will continue with metformin and her meal plan.  2. Other hyperlipidemia Cardiovascular risk and specific lipid/LDL goals reviewed. We discussed several lifestyle modifications today. Deborah [name] will continue her medications and meal plan. She will continue to work on diet, exercise and weight loss efforts. Orders and follow up as documented in  patient record.   Counseling Intensive lifestyle modifications are the first line treatment for this issue. . Dietary changes: Increase soluble fiber. Decrease simple carbohydrates. . Exercise changes: Moderate to vigorous-intensity aerobic activity 150 minutes per week if tolerated. . Lipid-lowering medications: see documented in medical record.  3. Class 1 obesity with serious comorbidity and body mass index (BMI) of 34.0 to 34.9 in adult, unspecified obesity type Deborah [name] is currently in the action stage of change. As such, her goal is to continue with weight loss efforts. She has agreed to keeping a food journal and adhering to recommended goals of 1100-1300 calories and 75+ grams of protein daily.   Exercise goals: No exercise has been prescribed at this time.  Behavioral modification strategies: planning for success and keeping a strict food journal.  Deborah [name] has agreed to follow-up with our clinic in 2 weeks. She was informed of the importance of frequent follow-up visits to maximize her success with intensive lifestyle modifications for her multiple health conditions.   Objective:   Blood pressure (!) 149/84, pulse 64, temperature 97.8 F (36.6 C), height 5\' 4"  (1.626 m), weight 199 lb (90.3 kg), SpO2 98 %. Body mass index is 34.16 kg/m.  General: Cooperative, alert, well developed, in no acute distress. HEENT: Conjunctivae and lids unremarkable. Cardiovascular: Regular rhythm.  Lungs: Normal work of breathing. Neurologic: No focal deficits.   Lab Results  Component Value Date   CREATININE 0.73 02/18/2021   BUN 16 02/18/2021   NA 135 02/18/2021   K 4.5 02/18/2021   CL 96 02/18/2021   CO2 24 02/18/2021   Lab Results  Component Value Date   ALT 20 02/18/2021   AST 15 02/18/2021  ALKPHOS 90 02/18/2021   BILITOT 0.4 02/18/2021   Lab Results  Component Value Date   HGBA1C 5.6 02/18/2021   HGBA1C 6.1 (H) 01/27/2021   HGBA1C 6.3 (H) 10/21/2020   HGBA1C 6.2 (H)  08/18/2020   HGBA1C 6.1 (H) 04/17/2020   Lab Results  Component Value Date   INSULIN 31.4 (H) 01/27/2021   Lab Results  Component Value Date   TSH 4.400 01/27/2021   Lab Results  Component Value Date   CHOL 168 02/18/2021   HDL 49 02/18/2021   LDLCALC 105 (H) 02/18/2021   TRIG 76 02/18/2021   CHOLHDL 3.4 02/18/2021   Lab Results  Component Value Date   WBC 6.4 01/27/2021   HGB 12.4 01/27/2021   HCT 38.0 01/27/2021   MCV 88 01/27/2021   PLT 453 (H) 01/27/2021   No results found for: IRON, TIBC, FERRITIN  Obesity Behavioral Intervention:   Approximately 15 minutes were spent on the discussion below.  ASK: We discussed the diagnosis of obesity with Deborah [name] today and Deborah [name] agreed to give [place] permission to discuss obesity behavioral modification therapy today.  ASSESS: Deborah [name] has the diagnosis of obesity and her BMI today is 34.14. Deborah [name] is in the action stage of change.   ADVISE: Deborah [name] was educated on the multiple health risks of obesity as well as the benefit of weight loss to improve her health. She was advised of the need for long term treatment and the importance of lifestyle modifications to improve her current health and to decrease her risk of future health problems.  AGREE: Multiple dietary modification options and treatment options were discussed and Deborah [name] agreed to follow the recommendations documented in the above note.  ARRANGE: Deborah [name] was educated on the importance of frequent visits to treat obesity as outlined per CMS and USPSTF guidelines and agreed to schedule her next follow up appointment today.  Attestation Statements:   Reviewed by clinician on day of visit: allergies, medications, problem list, medical history, surgical history, family history, social history, and previous encounter notes.   Wilhemena Durie, am acting as transcriptionist for [employer], PA-C.  I have reviewed the above documentation for accuracy and completeness,  and I agree with the above. Abby Potash, PA-C

## 2021-03-12 ENCOUNTER — Other Ambulatory Visit: Payer: Self-pay

## 2021-03-12 ENCOUNTER — Encounter (INDEPENDENT_AMBULATORY_CARE_PROVIDER_SITE_OTHER): Payer: Self-pay | Admitting: Family Medicine

## 2021-03-12 ENCOUNTER — Ambulatory Visit (INDEPENDENT_AMBULATORY_CARE_PROVIDER_SITE_OTHER): Payer: Medicare Other | Admitting: Family Medicine

## 2021-03-12 VITALS — BP 118/71 | HR 68 | Temp 98.1°F | Ht 64.0 in | Wt 196.0 lb

## 2021-03-12 DIAGNOSIS — R7303 Prediabetes: Secondary | ICD-10-CM

## 2021-03-12 DIAGNOSIS — Z6835 Body mass index (BMI) 35.0-35.9, adult: Secondary | ICD-10-CM | POA: Diagnosis not present

## 2021-03-17 NOTE — Progress Notes (Signed)
Chief Complaint:   OBESITY Deborah [name] is here to discuss her progress with her obesity treatment plan along with follow-up of her obesity related diagnoses. Deborah [name] is on keeping a food journal and adhering to recommended goals of 1100-1300 calories and 75+ grams of protein daily and states she is following her eating plan approximately 90% of the time. [name] states she is doing 0 minutes 0 times per week.  Today's visit was #: 4 Starting weight: 204 lbs Starting date: 01/27/2021 Today's weight: 196 lbs Today's date: 03/12/2021 Total lbs lost to date: 8 Total lbs lost since last in-office visit: 3  Interim History: [name] continues to do well with weight loss. She is journaling and trying to meet her goals, but she struggles more on the weekends.  Subjective:   1. Pre-diabetes Deborah [name] is doing well on metformin. Her primary care provider changed her to the XR version, which does not have as much weight loss, but it is easier to tolerate.  Assessment/Plan:   1. Pre-diabetes Deborah [name] will continue diet, exercise, metformin, and decreasing simple carbohydrates to help decrease the risk of diabetes.   2. Obesity with current BMI of 33.6 Deborah [name] is currently in the action stage of change. As such, her goal is to continue with weight loss efforts. She has agreed to the Category 2 Plan.   Behavioral modification strategies: increasing lean protein intake and meal planning and cooking strategies.  Deborah [name] has agreed to follow-up with our clinic in 2 to 3 weeks. She was informed of the importance of frequent follow-up visits to maximize her success with intensive lifestyle modifications for her multiple health conditions.   Objective:   Blood pressure 118/71, pulse 68, temperature 98.1 F (36.7 C), height 5\' 4"  (1.626 m), weight 196 lb (88.9 kg), SpO2 99 %. Body mass index is 33.64 kg/m.  General: Cooperative, alert, well developed, in no acute distress. HEENT: Conjunctivae and  lids unremarkable. Cardiovascular: Regular rhythm.  Lungs: Normal work of breathing. Neurologic: No focal deficits.   Lab Results  Component Value Date   CREATININE 0.73 02/18/2021   BUN 16 02/18/2021   NA 135 02/18/2021   K 4.5 02/18/2021   CL 96 02/18/2021   CO2 24 02/18/2021   Lab Results  Component Value Date   ALT 20 02/18/2021   AST 15 02/18/2021   ALKPHOS 90 02/18/2021   BILITOT 0.4 02/18/2021   Lab Results  Component Value Date   HGBA1C 5.6 02/18/2021   HGBA1C 6.1 (H) 01/27/2021   HGBA1C 6.3 (H) 10/21/2020   HGBA1C 6.2 (H) 08/18/2020   HGBA1C 6.1 (H) 04/17/2020   Lab Results  Component Value Date   INSULIN 31.4 (H) 01/27/2021   Lab Results  Component Value Date   TSH 4.400 01/27/2021   Lab Results  Component Value Date   CHOL 168 02/18/2021   HDL 49 02/18/2021   LDLCALC 105 (H) 02/18/2021   TRIG 76 02/18/2021   CHOLHDL 3.4 02/18/2021   Lab Results  Component Value Date   WBC 6.4 01/27/2021   HGB 12.4 01/27/2021   HCT 38.0 01/27/2021   MCV 88 01/27/2021   PLT 453 (H) 01/27/2021   No results found for: IRON, TIBC, FERRITIN  Attestation Statements:   Reviewed by clinician on day of visit: allergies, medications, problem list, medical history, surgical history, family history, social history, and previous encounter notes.  Time spent on visit including pre-visit chart review and post-visit care and charting was 22 minutes.  I, Trixie Dredge, am acting as transcriptionist for Dennard Nip, MD.  I have reviewed the above documentation for accuracy and completeness, and I agree with the above. -  Dennard Nip, MD

## 2021-03-18 ENCOUNTER — Other Ambulatory Visit: Payer: Self-pay | Admitting: Cardiovascular Disease

## 2021-03-18 DIAGNOSIS — I10 Essential (primary) hypertension: Secondary | ICD-10-CM

## 2021-03-23 ENCOUNTER — Other Ambulatory Visit: Payer: Self-pay

## 2021-03-23 ENCOUNTER — Ambulatory Visit (INDEPENDENT_AMBULATORY_CARE_PROVIDER_SITE_OTHER): Payer: Medicare Other | Admitting: Nurse Practitioner

## 2021-03-23 ENCOUNTER — Encounter: Payer: Self-pay | Admitting: Nurse Practitioner

## 2021-03-23 VITALS — BP 130/80 | HR 92 | Temp 97.7°F | Ht 62.0 in | Wt 198.0 lb

## 2021-03-23 DIAGNOSIS — Z6836 Body mass index (BMI) 36.0-36.9, adult: Secondary | ICD-10-CM

## 2021-03-23 DIAGNOSIS — E1169 Type 2 diabetes mellitus with other specified complication: Secondary | ICD-10-CM

## 2021-03-23 DIAGNOSIS — R053 Chronic cough: Secondary | ICD-10-CM

## 2021-03-23 NOTE — Patient Instructions (Signed)
Diabetes Mellitus Basics  Diabetes mellitus, or diabetes, is a long-term (chronic) disease. It occurs when the body does not properly use sugar (glucose) that is released from food after you eat. Diabetes mellitus may be caused by one or both of these problems:  Your pancreas does not make enough of a hormone called insulin.  Your body does not react in a normal way to the insulin that it makes. Insulin lets glucose enter cells in your body. This gives you energy. If you have diabetes, glucose cannot get into cells. This causes high blood glucose (hyperglycemia). How to treat and manage diabetes You may need to take insulin or other diabetes medicines daily to keep your glucose in balance. If you are prescribed insulin, you will learn how to give yourself insulin by injection. You may need to adjust the amount of insulin you take based on the foods that you eat. You will need to check your blood glucose levels using a glucose monitor as told by your health care provider. The readings can help determine if you have low or high blood glucose. Generally, you should have these blood glucose levels:  Before meals (preprandial): 80-130 mg/dL (4.4-7.2 mmol/L).  After meals (postprandial): below 180 mg/dL (10 mmol/L).  Hemoglobin A1c (HbA1c) level: less than 7%. Your health care provider will set treatment goals for you. Keep all follow-up visits. This is important. Follow these instructions at home: Diabetes medicines Take your diabetes medicines every day as told by your health care provider. List your diabetes medicines here:  Name of medicine: ______________________________ ? Amount (dose): _______________ Time (a.m./p.m.): _______________ Notes: ___________________________________  Name of medicine: ______________________________ ? Amount (dose): _______________ Time (a.m./p.m.): _______________ Notes: ___________________________________  Name of medicine:  ______________________________ ? Amount (dose): _______________ Time (a.m./p.m.): _______________ Notes: ___________________________________ Insulin If you use insulin, list the types of insulin you use here:  Insulin type: ______________________________ ? Amount (dose): _______________ Time (a.m./p.m.): _______________Notes: ___________________________________  Insulin type: ______________________________ ? Amount (dose): _______________ Time (a.m./p.m.): _______________ Notes: ___________________________________  Insulin type: ______________________________ ? Amount (dose): _______________ Time (a.m./p.m.): _______________ Notes: ___________________________________  Insulin type: ______________________________ ? Amount (dose): _______________ Time (a.m./p.m.): _______________ Notes: ___________________________________  Insulin type: ______________________________ ? Amount (dose): _______________ Time (a.m./p.m.): _______________ Notes: ___________________________________ Managing blood glucose Check your blood glucose levels using a glucose monitor as told by your health care provider. Write down the times that you check your glucose levels here:  Time: _______________ Notes: ___________________________________  Time: _______________ Notes: ___________________________________  Time: _______________ Notes: ___________________________________  Time: _______________ Notes: ___________________________________  Time: _______________ Notes: ___________________________________  Time: _______________ Notes: ___________________________________   Low blood glucose Low blood glucose (hypoglycemia) is when glucose is at or below 70 mg/dL (3.9 mmol/L). Symptoms may include:  Feeling: ? Hungry. ? Sweaty and clammy. ? Irritable or easily upset. ? Dizzy. ? Sleepy.  Having: ? A fast heartbeat. ? A headache. ? A change in your vision. ? Numbness around the mouth, lips, or  tongue.  Having trouble with: ? Moving (coordination). ? Sleeping. Treating low blood glucose To treat low blood glucose, eat or drink something containing sugar right away. If you can think clearly and swallow safely, follow the 15:15 rule:  Take 15 grams of a fast-acting carb (carbohydrate), as told by your health care provider.  Some fast-acting carbs are: ? Glucose tablets: take 3-4 tablets. ? Hard candy: eat 3-5 pieces. ? Fruit juice: drink 4 oz (120 mL). ? Regular (not diet) soda: drink 4-6 oz (120-180 mL). ? Honey or sugar:   eat 1 Tbsp (15 mL).  Check your blood glucose levels 15 minutes after you take the carb.  If your glucose is still at or below 70 mg/dL (3.9 mmol/L), take 15 grams of a carb again.  If your glucose does not go above 70 mg/dL (3.9 mmol/L) after 3 tries, get help right away.  After your glucose goes back to normal, eat a meal or a snack within 1 hour. Treating very low blood glucose If your glucose is at or below 54 mg/dL (3 mmol/L), you have very low blood glucose (severe hypoglycemia). This is an emergency. Do not wait to see if the symptoms will go away. Get medical help right away. Call your local emergency services (911 in the [place] Do not drive yourself to the hospital. Questions to ask your health care provider  Should I talk with a diabetes educator?  What equipment will I need to care for myself at home?  What diabetes medicines do I need? When should I take them?  How often do I need to check my blood glucose levels?  What number can I call if I have questions?  When is my follow-up visit?  Where can I find a support group for people with diabetes? Where to find more information  American Diabetes Association: www.diabetes.org  Association of Diabetes Care and Education Specialists: www.diabeteseducator.org Contact a health care provider if:  Your blood glucose is at or above 240 mg/dL (13.3 mmol/L) for 2 days in a row.  You have  been sick or have had a fever for 2 days or more, and you are not getting better.  You have any of these problems for more than 6 hours: ? You cannot eat or drink. ? You feel nauseous. ? You vomit. ? You have diarrhea. Get help right away if:  Your blood glucose is lower than 54 mg/dL (3 mmol/L).  You get confused.  You have trouble thinking clearly.  You have trouble breathing. These symptoms may represent a serious problem that is an emergency. Do not wait to see if the symptoms will go away. Get medical help right away. Call your local emergency services (911 in the [place] Do not drive yourself to the hospital. Summary  Diabetes mellitus is a chronic disease that occurs when the body does not properly use sugar (glucose) that is released from food after you eat.  Take insulin and diabetes medicines as told.  Check your blood glucose every day, as often as told.  Keep all follow-up visits. This is important. This information is not intended to replace advice given to you by your health care provider. Make sure you discuss any questions you have with your health care provider. Document Revised: 04/08/2020 Document Reviewed: 04/08/2020 Elsevier Patient Education  2021 Elsevier Inc.  

## 2021-03-23 NOTE — Progress Notes (Deleted)
This visit occurred during the SARS-CoV-2 public health emergency.  Safety protocols were in place, including screening questions prior to the visit, additional usage of staff PPE, and extensive cleaning of exam room while observing appropriate contact time as indicated for disinfecting solutions.  Subjective:     Patient ID: Deborah [name] , female    DOB: 06-25-1951 , 70 y.o.   MRN: [id number]   Chief Complaint  Patient presents with  . Diabetes    HPI  Deborah [name] presents for a medication follow-up.  Glimipiride was discontinued and she was started on Metformin XR on 02/18/2021.  She reports occasional diarrhea and denies abdominal pain and nausea.  Her fasting blood sugars have been running 125-138. She reports following the diet from [facility] 85% of the time.   She declines a colonoscopy.  Negative Cologaurd in 07/2020.     Diabetes     Past Medical History:  Diagnosis Date  . Back pain   . Death of child 49   Due to cord strangulation.  . Diabetes mellitus without complication ([place])   . Hyperlipidemia   . Hypertension   . Hypothyroidism   . Joint pain   . Osteoarthritis   . Other fatigue   . Shortness of breath on exertion   . Thyroid condition      Family History  Problem Relation Age of Onset  . COPD Mother   . Diabetes Mother   . Hypertension Mother   . Obesity Mother   . Heart disease Father   . Hypertension Father   . Glaucoma Father      Current Outpatient Medications:  .  acetaminophen (TYLENOL) 500 MG tablet, Take 1,000 mg by mouth at bedtime., Disp: , Rfl:  .  Blood Glucose Monitoring Suppl (ACURA BLOOD GLUCOSE METER) w/Device KIT, Check blood sugars twice daily. Please provide patient with meter and supplies that is covered by pt ins., Disp: 1 kit, Rfl: 0 .  Blood Glucose Monitoring Suppl (ONE TOUCH ULTRA MINI) w/Device KIT, Use to check blood sugars 3 times a day. Dx code:e11.65, Disp: 1 kit, Rfl: 0 .   cholecalciferol (VITAMIN D) 1000 units tablet, Take 2,000 Units by mouth daily., Disp: , Rfl:  .  cyclobenzaprine (FLEXERIL) 10 MG tablet, Take 1 tablet (10 mg total) by mouth 3 (three) times daily as needed for muscle spasms., Disp: 30 tablet, Rfl: 1 .  docusate sodium (COLACE) 100 MG capsule, Take 100 mg by mouth daily., Disp: , Rfl:  .  guaiFENesin-Codeine 200-10 MG/5ML LIQD, Take 5 mLs by mouth 3 (three) times daily as needed., Disp: 210 mL, Rfl: 0 .  hydrALAZINE (APRESOLINE) 100 MG tablet, Take 1 tablet (100 mg total) by mouth 2 (two) times daily., Disp: 180 tablet, Rfl: 1 .  hydrochlorothiazide (HYDRODIURIL) 25 MG tablet, TAKE 1 TABLET BY MOUTH  DAILY, Disp: 30 tablet, Rfl: 2 .  levothyroxine (SYNTHROID) 100 MCG tablet, TAKE 1 TABLET BY MOUTH  DAILY BEFORE BREAKFAST, Disp: 90 tablet, Rfl: 3 .  MAGNESIUM PO, Take 375 mg by mouth daily., Disp: , Rfl:  .  melatonin 5 MG TABS, Take 5 mg by mouth at bedtime., Disp: , Rfl:  .  metFORMIN (GLUCOPHAGE XR) 500 MG 24 hr tablet, Take 1 tablet (500 mg total) by mouth daily with breakfast., Disp: 30 tablet, Rfl: 2 .  ONETOUCH ULTRA test strip, USE WITH METER TO CHECK  BLOOD SUGAR TWO TIMES  DAILY, BEFORE BREAKFAST AND BEFORE DINNER, Disp: 200 strip,  Rfl: 3 .  rosuvastatin (CRESTOR) 5 MG tablet, TAKE 1 TABLET BY MOUTH  DAILY ON MONDAY, WEDNESDAY, AND FRIDAY, Disp: 39 tablet, Rfl: 3 .  spironolactone (ALDACTONE) 25 MG tablet, TAKE 1 TABLET BY MOUTH  DAILY, Disp: 30 tablet, Rfl: 2 .  Turmeric 500 MG CAPS, Take 1 tablet by mouth as needed (Takes occassionally)., Disp: , Rfl:    Allergies  Allergen Reactions  . Amlodipine     headache  . Aspartame And Phenylalanine Other (See Comments)    Causes migraines  . Bidil [Isosorb Dinitrate-Hydralazine]     headache  . Erythromycin     Ask patient to specify type/severity/reaction. Not indicated on medical history form dated 11/04/10.  . Ibuprofen     Ask patient to specify type/severity/reaction. Not noted on  medical history form dated 11/04/10.  Marland Kitchen Phenylalanine Other (See Comments)    Causes migraine  . Prilosec [Omeprazole] Nausea And Vomiting  . Tramadol Nausea And Vomiting  . Valsartan Cough  . Zantac [Ranitidine Hcl]      Review of Systems  Constitutional: Negative.   HENT: Negative.   Eyes: Negative.   Respiratory: Negative.   Cardiovascular: Negative.   Gastrointestinal: Positive for diarrhea.  Endocrine: Negative.   Genitourinary: Negative.   Allergic/Immunologic: Negative.   Neurological: Negative.      Today's Vitals   03/23/21 1409  BP: 130/80  Pulse: 92  Temp: 97.7 F (36.5 C)  TempSrc: Oral  Weight: 198 lb (89.8 kg)  Height: '5\' 2"'  (1.575 m)  PainSc: 5   PainLoc: Knee   Body mass index is 36.21 kg/m.   Objective:  Physical Exam Constitutional:      Appearance: Normal appearance. She is obese.  Cardiovascular:     Rate and Rhythm: Normal rate and regular rhythm.     Pulses: Normal pulses.     Heart sounds: Normal heart sounds.  Pulmonary:     Effort: Pulmonary effort is normal.     Breath sounds: Normal breath sounds.  Neurological:     Mental Status: She is alert.         Assessment And Plan:     There are no diagnoses linked to this encounter.    Patient was given opportunity to ask questions. Patient verbalized understanding of the plan and was able to repeat key elements of the plan. All questions were answered to their satisfaction.  Anastasio Auerbach, RN   I, Anastasio Auerbach, RN, have reviewed all documentation for this visit. The documentation on 03/23/21 for the exam, diagnosis, procedures, and orders are all accurate and complete.   IF YOU HAVE BEEN REFERRED TO A SPECIALIST, IT MAY TAKE 1-2 WEEKS TO SCHEDULE/PROCESS THE REFERRAL. IF YOU HAVE NOT HEARD FROM US/SPECIALIST IN TWO WEEKS, PLEASE GIVE [place] A CALL AT [phone].   THE PATIENT IS ENCOURAGED TO PRACTICE SOCIAL DISTANCING DUE TO THE COVID-19 PANDEMIC.

## 2021-03-23 NOTE — Progress Notes (Addendum)
I,Yamilka Roman [employer] as a Education administrator for [employer], FNP.,have documented all relevant documentation on the behalf of Minette Brine, FNP,as directed by  Minette Brine, FNP while in the presence of Minette Brine, [place].  This visit occurred during the SARS-CoV-2 public health emergency.  Safety protocols were in place, including screening questions prior to the visit, additional usage of staff PPE, and extensive cleaning of exam room while observing appropriate contact time as indicated for disinfecting solutions.  Subjective:     Patient ID: Deborah [name] , female    DOB: 05-03-1951 , 70 y.o.   MRN: [id number]   Chief Complaint  Patient presents with   Diabetes    HPI  Patient presents today for a med f/u on his metformin. She stated she is tolerating the med well. She does still have some diarrhea, notices after eating at times she will have diarrhea. She feels like her blood sugars are higher with the metformin XR.  Glimipiride was discontinued and she was started on Metformin XR on 02/18/2021. She has noticed her blood sugars have increased slightly.  She has also been on colace for years to help with constipation.  She reports occasional diarrhea and denies abdominal pain and nausea.  Her fasting blood sugars have been running 125-138. She reports following the diet from [facility] 85% of the time.   She declines a colonoscopy.  Negative Cologaurd in 07/2020.     She reports her scale at home is 198 lbs.    Diabetes She presents for her follow-up diabetic visit. Diabetes type: prediabetes. Her disease course has been stable. There are no hypoglycemic associated symptoms. Pertinent negatives for hypoglycemia include no dizziness. There are no diabetic associated symptoms. Pertinent negatives for diabetes include no polydipsia, no polyphagia and no polyuria. There are no hypoglycemic complications. Symptoms are stable. There are no diabetic complications. Risk factors  for coronary artery disease include diabetes mellitus, obesity and sedentary lifestyle. She is compliant with treatment most of the time. Her weight is stable. She is following a generally healthy (Addendum - Since going to [facility]) diet. When asked about meal planning, she reported none. She has had a previous visit with a dietitian ([facility]). She rarely participates in exercise. There is no change in her home blood glucose trend. 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 She does not see a podiatrist.Eye exam is current (10/22/2020).    Past Medical History:  Diagnosis Date   Back pain    Death of child 27   Due to cord strangulation.   Diabetes mellitus without complication (HCC)    Hyperlipidemia    Hypertension    Hypothyroidism    Joint pain    Osteoarthritis    Other fatigue    Shortness of breath on exertion    Thyroid condition      Family History  Problem Relation Age of Onset   COPD Mother    Diabetes Mother    Hypertension Mother    Obesity Mother    Heart disease Father    Hypertension Father    Glaucoma Father      Current Outpatient Medications:    acetaminophen (TYLENOL) 500 MG tablet, Take 1,000 mg by mouth at bedtime., Disp: , Rfl:    Blood Glucose Monitoring Suppl [facility] BLOOD  GLUCOSE METER) w/Device KIT, Check blood sugars twice daily. Please provide patient with meter and supplies that is covered by pt ins., Disp: 1 kit, Rfl: 0   Blood Glucose Monitoring Suppl (ONE TOUCH ULTRA MINI) w/Device KIT, Use to check blood sugars 3 times a day. Dx code:e11.65, Disp: 1 kit, Rfl: 0   cholecalciferol (VITAMIN D) 1000 units tablet, Take 2,000 Units by mouth daily., Disp: , Rfl:    cyclobenzaprine (FLEXERIL) 10 MG tablet, Take 1 tablet (10 mg total) by mouth 3 (three)  times daily as needed for muscle spasms., Disp: 30 tablet, Rfl: 1   docusate sodium (COLACE) 100 MG capsule, Take 100 mg by mouth daily., Disp: , Rfl:    guaiFENesin-Codeine 200-10 MG/5ML LIQD, Take 5 mLs by mouth 3 (three) times daily as needed., Disp: 210 mL, Rfl: 0   hydrALAZINE (APRESOLINE) 100 MG tablet, Take 1 tablet (100 mg total) by mouth 2 (two) times daily., Disp: 180 tablet, Rfl: 1   hydrochlorothiazide (HYDRODIURIL) 25 MG tablet, TAKE 1 TABLET BY MOUTH  DAILY, Disp: 30 tablet, Rfl: 2   levothyroxine (SYNTHROID) 100 MCG tablet, TAKE 1 TABLET BY MOUTH  DAILY BEFORE BREAKFAST, Disp: 90 tablet, Rfl: 3   MAGNESIUM PO, Take 375 mg by mouth daily., Disp: , Rfl:    melatonin 5 MG TABS, Take 5 mg by mouth at bedtime., Disp: , Rfl:    metFORMIN (GLUCOPHAGE XR) 500 MG 24 hr tablet, Take 1 tablet (500 mg total) by mouth daily with breakfast., Disp: 30 tablet, Rfl: 2   ONETOUCH ULTRA test strip, USE WITH METER TO CHECK  BLOOD SUGAR TWO TIMES  DAILY, BEFORE BREAKFAST AND BEFORE DINNER, Disp: 200 strip, Rfl: 3   rosuvastatin (CRESTOR) 5 MG tablet, TAKE 1 TABLET BY MOUTH  DAILY ON MONDAY, WEDNESDAY, AND FRIDAY, Disp: 39 tablet, Rfl: 3   spironolactone (ALDACTONE) 25 MG tablet, TAKE 1 TABLET BY MOUTH  DAILY, Disp: 30 tablet, Rfl: 2   Turmeric 500 MG CAPS, Take 1 tablet by mouth as needed (Takes occassionally)., Disp: , Rfl:    Allergies  Allergen Reactions   Amlodipine     headache   Aspartame And Phenylalanine Other (See Comments)    Causes migraines   Bidil [Isosorb Dinitrate-Hydralazine]     headache   Erythromycin     Ask patient to specify type/severity/reaction. Not indicated on medical history form dated 11/04/10.   Ibuprofen     Ask patient to specify type/severity/reaction. Not noted on medical history form dated 11/04/10.   Phenylalanine Other (See Comments)    Causes migraine   Prilosec [Omeprazole] Nausea And Vomiting   Tramadol Nausea And Vomiting   Valsartan Cough   Zantac  [Ranitidine Hcl]      Review of Systems  Constitutional: Negative.   Respiratory: Negative.  Negative for cough.   Cardiovascular: Negative.  Negative for leg swelling.  Gastrointestinal: Positive for diarrhea (intermittent diarrhea).  Endocrine: Negative for polydipsia, polyphagia and polyuria.  Neurological: Negative for dizziness.  Psychiatric/Behavioral: Negative.      Today's Vitals   03/23/21 1409  BP: 130/80  Pulse: 92  Temp: 97.7 F (36.5 C)  TempSrc: Oral  Weight: 198 lb (89.8 kg)  Height: '5\' 2"'  (1.575 m)  PainSc: 5   PainLoc: Knee   Body mass index is 36.21 kg/m.   Objective:  Physical Exam Vitals reviewed.  Constitutional:      General: She is not in acute distress.    Appearance: Normal appearance. She is obese.  Cardiovascular:     Rate and Rhythm: Normal rate and regular rhythm.     Pulses: Normal pulses.     Heart sounds: Normal heart sounds. No murmur heard.   Pulmonary:     Effort: Pulmonary effort is normal. No respiratory distress.     Breath sounds: Normal breath sounds. No wheezing.  Musculoskeletal:        General: Normal range of motion.  Skin:    General: Skin is warm and dry.     Capillary Refill: Capillary refill takes less than 2 seconds.  Neurological:     General: No focal deficit present.     Mental Status: She is alert and oriented to person, place, and time.     Cranial Nerves: No cranial nerve deficit.  Psychiatric:        Mood and Affect: Mood normal.        Behavior: Behavior normal.        Thought Content: Thought content normal.        Judgment: Judgment normal.     Comments: Tearful during visit when talking about caring for her husband.          Assessment And Plan:     1. Type 2 diabetes mellitus with other specified complication, without long-term current use of insulin (HCC) Chronic, she is overall tolerating her metformin XR other than occasional diarrhea She does not want to stop the XR due to possible not  helping as much with weight loss  2. Class 2 severe obesity due to excess calories with serious comorbidity and body mass index (BMI) of 36.0 to 36.9 in adult [facility]) Chronic Discussed healthy diet and regular exercise options  Encouraged to exercise at least 150 minutes per week with 2 days of strength training She is to continue follow up with [facility].   Addendum made to indicate she has seen a dietician, narrative HPI indicates follows [facility] plan 85% of the time   Patient was given opportunity to ask questions. Patient verbalized understanding of the plan and was able to repeat key elements of the plan. All questions were answered to their satisfaction.  Minette Brine, FNP   I, Minette Brine, FNP, have reviewed all documentation for this visit. The documentation on 03/23/21 for the exam, diagnosis, procedures, and orders are all accurate and complete.   IF YOU HAVE BEEN REFERRED TO A SPECIALIST, IT MAY TAKE 1-2 WEEKS TO SCHEDULE/PROCESS THE REFERRAL. IF YOU HAVE NOT HEARD FROM US/SPECIALIST IN TWO WEEKS, PLEASE GIVE [place] A CALL AT [phone].   THE PATIENT IS ENCOURAGED TO PRACTICE SOCIAL DISTANCING DUE TO THE COVID-19 PANDEMIC.

## 2021-03-24 ENCOUNTER — Ambulatory Visit: Payer: Medicare Other | Admitting: Nurse Practitioner

## 2021-03-27 DIAGNOSIS — H2513 Age-related nuclear cataract, bilateral: Secondary | ICD-10-CM | POA: Diagnosis not present

## 2021-03-27 DIAGNOSIS — H401131 Primary open-angle glaucoma, bilateral, mild stage: Secondary | ICD-10-CM | POA: Diagnosis not present

## 2021-03-27 DIAGNOSIS — E119 Type 2 diabetes mellitus without complications: Secondary | ICD-10-CM | POA: Diagnosis not present

## 2021-03-27 DIAGNOSIS — Z9889 Other specified postprocedural states: Secondary | ICD-10-CM | POA: Diagnosis not present

## 2021-03-27 DIAGNOSIS — H43813 Vitreous degeneration, bilateral: Secondary | ICD-10-CM | POA: Diagnosis not present

## 2021-03-27 DIAGNOSIS — H53423 Scotoma of blind spot area, bilateral: Secondary | ICD-10-CM | POA: Diagnosis not present

## 2021-03-30 ENCOUNTER — Encounter: Payer: Self-pay | Admitting: Nurse Practitioner

## 2021-04-01 ENCOUNTER — Other Ambulatory Visit: Payer: Self-pay | Admitting: Nurse Practitioner

## 2021-04-01 MED ORDER — HYDROCODONE-HOMATROPINE 5-1.5 MG/5ML PO SYRP: 5.0000 mL | ORAL_SOLUTION | Freq: Four times a day (QID) | ORAL | 0 refills | Status: DC | PRN

## 2021-04-02 ENCOUNTER — Ambulatory Visit (INDEPENDENT_AMBULATORY_CARE_PROVIDER_SITE_OTHER): Payer: Medicare Other | Admitting: Physician Assistant

## 2021-04-02 ENCOUNTER — Other Ambulatory Visit: Payer: Self-pay

## 2021-04-02 ENCOUNTER — Encounter (INDEPENDENT_AMBULATORY_CARE_PROVIDER_SITE_OTHER): Payer: Self-pay | Admitting: Physician Assistant

## 2021-04-02 VITALS — BP 125/73 | HR 73 | Temp 97.8°F | Ht 64.0 in | Wt 196.0 lb

## 2021-04-02 DIAGNOSIS — E669 Obesity, unspecified: Secondary | ICD-10-CM

## 2021-04-02 DIAGNOSIS — E7849 Other hyperlipidemia: Secondary | ICD-10-CM | POA: Diagnosis not present

## 2021-04-02 DIAGNOSIS — Z6834 Body mass index (BMI) 34.0-34.9, adult: Secondary | ICD-10-CM | POA: Diagnosis not present

## 2021-04-06 NOTE — Progress Notes (Signed)
Chief Complaint:   OBESITY Deborah [name] is here to discuss her progress with her obesity treatment plan along with follow-up of her obesity related diagnoses. Deborah [name] is on the Category 2 Plan and states she is following her eating plan approximately 85% of the time. Deborah [name] states she is not currently exercising.  Today's visit was #: 5 Starting weight: 204 lbs Starting date: 01/27/2021 Today's weight: 196 lbs Today's date: 04/02/2021 Total lbs lost to date: 8 Total lbs lost since last in-office visit: 0  Interim History: Deborah [name] is journaling most days. She is averaging over 1300 calories many days and is hitting her protein goal. She struggles on the weekends and sometimes will skip a meal. She has also been drinking protein shakes.  Subjective:   1. Other hyperlipidemia Deborah [name]'s last LDL is not at goal. She is on Crestor.  Assessment/Plan:   1. Other hyperlipidemia Cardiovascular risk and specific lipid/LDL goals reviewed.  We discussed several lifestyle modifications today and Deborah [name] will continue to work on diet, exercise and weight loss efforts. Orders and follow up as documented in patient record. Continue with meds and plan.  Counseling Intensive lifestyle modifications are the first line treatment for this issue. . Dietary changes: Increase soluble fiber. Decrease simple carbohydrates. . Exercise changes: Moderate to vigorous-intensity aerobic activity 150 minutes per week if tolerated. . Lipid-lowering medications: see documented in medical record.  2. Class 1 obesity with serious comorbidity and body mass index (BMI) of 34.0 to 34.9 in adult, unspecified obesity type Deborah [name] is currently in the action stage of change. As such, her goal is to continue with weight loss efforts. She has agreed to the Category 2 Plan and keeping a food journal and adhering to recommended goals of 1100-1300 calories and 75 g protein.   Exercise goals: No exercise has been prescribed at this  time.  Behavioral modification strategies: no skipping meals.  Deborah [name] has agreed to follow-up with our clinic in 4 weeks. She was informed of the importance of frequent follow-up visits to maximize her success with intensive lifestyle modifications for her multiple health conditions.   Objective:   Blood pressure 125/73, pulse 73, temperature 97.8 F (36.6 C), height 5\' 4"  (1.626 m), weight 196 lb (88.9 kg), SpO2 97 %. Body mass index is 33.64 kg/m.  General: Cooperative, alert, well developed, in no acute distress. HEENT: Conjunctivae and lids unremarkable. Cardiovascular: Regular rhythm.  Lungs: Normal work of breathing. Neurologic: No focal deficits.   Lab Results  Component Value Date   CREATININE 0.73 02/18/2021   BUN 16 02/18/2021   NA 135 02/18/2021   K 4.5 02/18/2021   CL 96 02/18/2021   CO2 24 02/18/2021   Lab Results  Component Value Date   ALT 20 02/18/2021   AST 15 02/18/2021   ALKPHOS 90 02/18/2021   BILITOT 0.4 02/18/2021   Lab Results  Component Value Date   HGBA1C 5.6 02/18/2021   HGBA1C 6.1 (H) 01/27/2021   HGBA1C 6.3 (H) 10/21/2020   HGBA1C 6.2 (H) 08/18/2020   HGBA1C 6.1 (H) 04/17/2020   Lab Results  Component Value Date   INSULIN 31.4 (H) 01/27/2021   Lab Results  Component Value Date   TSH 4.400 01/27/2021   Lab Results  Component Value Date   CHOL 168 02/18/2021   HDL 49 02/18/2021   LDLCALC 105 (H) 02/18/2021   TRIG 76 02/18/2021   CHOLHDL 3.4 02/18/2021   Lab Results  Component Value Date   WBC 6.4 01/27/2021  HGB 12.4 01/27/2021   HCT 38.0 01/27/2021   MCV 88 01/27/2021   PLT 453 (H) 01/27/2021   No results found for: IRON, TIBC, FERRITIN  Obesity Behavioral Intervention:   Approximately 15 minutes were spent on the discussion below.  ASK: We discussed the diagnosis of obesity with Deborah [name] today and Deborah [name] agreed to give [place] permission to discuss obesity behavioral modification therapy today.  ASSESS: Deborah [name] has  the diagnosis of obesity and her BMI today is 33.8. Deborah [name] is in the action stage of change.   ADVISE: Deborah [name] was educated on the multiple health risks of obesity as well as the benefit of weight loss to improve her health. She was advised of the need for long term treatment and the importance of lifestyle modifications to improve her current health and to decrease her risk of future health problems.  AGREE: Multiple dietary modification options and treatment options were discussed and Deborah [name] agreed to follow the recommendations documented in the above note.  ARRANGE: Deborah [name] was educated on the importance of frequent visits to treat obesity as outlined per CMS and USPSTF guidelines and agreed to schedule her next follow up appointment today.  Attestation Statements:   Reviewed by clinician on day of visit: allergies, medications, problem list, medical history, surgical history, family history, social history, and previous encounter notes.  Coral Ceo, am acting as Location manager for [employer], PA-C.  I have reviewed the above documentation for accuracy and completeness, and I agree with the above. Abby Potash, PA-C

## 2021-04-20 ENCOUNTER — Other Ambulatory Visit: Payer: Self-pay

## 2021-04-20 ENCOUNTER — Ambulatory Visit (INDEPENDENT_AMBULATORY_CARE_PROVIDER_SITE_OTHER): Payer: Medicare Other | Admitting: Family Medicine

## 2021-04-20 ENCOUNTER — Encounter (INDEPENDENT_AMBULATORY_CARE_PROVIDER_SITE_OTHER): Payer: Self-pay | Admitting: Family Medicine

## 2021-04-20 VITALS — BP 133/87 | HR 73 | Temp 98.0°F | Ht 64.0 in | Wt 193.0 lb

## 2021-04-20 DIAGNOSIS — Z6835 Body mass index (BMI) 35.0-35.9, adult: Secondary | ICD-10-CM | POA: Diagnosis not present

## 2021-04-20 DIAGNOSIS — E119 Type 2 diabetes mellitus without complications: Secondary | ICD-10-CM

## 2021-04-20 MED ORDER — METFORMIN HCL ER 500 MG PO TB24: 500.0000 mg | ORAL_TABLET | Freq: Two times a day (BID) | ORAL | 0 refills | Status: DC

## 2021-04-21 NOTE — Progress Notes (Signed)
Chief Complaint:   OBESITY Deborah [name] is here to discuss her progress with her obesity treatment plan along with follow-up of her obesity related diagnoses. Deborah [name] is on the Category 2 Plan or keeping a food journal and adhering to recommended goals of 1100-1300 calories and 75 grams of protein daily and states she is following her eating plan approximately 90% of the time. Deborah [name] states she is doing 0 minutes 0 times per week.  Today's visit was #: 6 Starting weight: 204 lbs Starting date: 01/27/2021 Today's weight: 193 lbs Today's date: 04/20/2021 Total lbs lost to date: 11 Total lbs lost since last in-office visit: 3  Interim History: Deborah [name] continues to do well with weight loss. She is mostly journaling and she does well meeting her protein goals.  Subjective:   1. Type 2 diabetes mellitus without complication, without long-term current use of insulin (HCC) Deborah [name] states her fasting BGs range between 120-129. She is on metformin, and she is not on glimepiride anymore. She still notes some polyphagia.  Assessment/Plan:   1. Type 2 diabetes mellitus without complication, without long-term current use of insulin (HCC) Deborah [name] agreed to increase metformin XR to 500 mg BID with no refills, and she will continue diet and exercise. Good blood sugar control is important to decrease the likelihood of diabetic complications such as nephropathy, neuropathy, limb loss, blindness, coronary artery disease, and death. Intensive lifestyle modification including diet, exercise and weight loss are the first line of treatment for diabetes.   - metFORMIN (GLUCOPHAGE XR) 500 MG 24 hr tablet; Take 1 tablet (500 mg total) by mouth 2 (two) times daily.  Dispense: 60 tablet; Refill: 0  2. Obesity with current BMI 33.3 Deborah [name] is currently in the action stage of change. As such, her goal is to continue with weight loss efforts. She has agreed to keeping a food journal and adhering to recommended goals of  1300 calories and 75+ grams of protein daily.   Exercise goals: All adults should avoid inactivity. Some physical activity is better than none, and adults who participate in any amount of physical activity gain some health benefits. Especially strengthening.  Behavioral modification strategies: keeping a strict food journal.  Deborah [name] has agreed to follow-up with our clinic in 2 to 3 weeks. She was informed of the importance of frequent follow-up visits to maximize her success with intensive lifestyle modifications for her multiple health conditions.   Objective:   Blood pressure 133/87, pulse 73, temperature 98 F (36.7 C), height 5\' 4"  (1.626 m), weight 193 lb (87.5 kg), SpO2 97 %. Body mass index is 33.13 kg/m.  General: Cooperative, alert, well developed, in no acute distress. HEENT: Conjunctivae and lids unremarkable. Cardiovascular: Regular rhythm.  Lungs: Normal work of breathing. Neurologic: No focal deficits.   Lab Results  Component Value Date   CREATININE 0.73 02/18/2021   BUN 16 02/18/2021   NA 135 02/18/2021   K 4.5 02/18/2021   CL 96 02/18/2021   CO2 24 02/18/2021   Lab Results  Component Value Date   ALT 20 02/18/2021   AST 15 02/18/2021   ALKPHOS 90 02/18/2021   BILITOT 0.4 02/18/2021   Lab Results  Component Value Date   HGBA1C 5.6 02/18/2021   HGBA1C 6.1 (H) 01/27/2021   HGBA1C 6.3 (H) 10/21/2020   HGBA1C 6.2 (H) 08/18/2020   HGBA1C 6.1 (H) 04/17/2020   Lab Results  Component Value Date   INSULIN 31.4 (H) 01/27/2021   Lab Results  Component Value  Date   TSH 4.400 01/27/2021   Lab Results  Component Value Date   CHOL 168 02/18/2021   HDL 49 02/18/2021   LDLCALC 105 (H) 02/18/2021   TRIG 76 02/18/2021   CHOLHDL 3.4 02/18/2021   Lab Results  Component Value Date   WBC 6.4 01/27/2021   HGB 12.4 01/27/2021   HCT 38.0 01/27/2021   MCV 88 01/27/2021   PLT 453 (H) 01/27/2021   No results found for: IRON, TIBC, FERRITIN  Obesity  Behavioral Intervention:   Approximately 15 minutes were spent on the discussion below.  ASK: We discussed the diagnosis of obesity with Deborah [name] today and Deborah [name] agreed to give [place] permission to discuss obesity behavioral modification therapy today.  ASSESS: Deborah [name] has the diagnosis of obesity and her BMI today is 33.11. Deborah [name] is in the action stage of change.   ADVISE: Deborah [name] was educated on the multiple health risks of obesity as well as the benefit of weight loss to improve her health. She was advised of the need for long term treatment and the importance of lifestyle modifications to improve her current health and to decrease her risk of future health problems.  AGREE: Multiple dietary modification options and treatment options were discussed and Deborah [name] agreed to follow the recommendations documented in the above note.  ARRANGE: Deborah [name] was educated on the importance of frequent visits to treat obesity as outlined per CMS and USPSTF guidelines and agreed to schedule her next follow up appointment today.  Attestation Statements:   Reviewed by clinician on day of visit: allergies, medications, problem list, medical history, surgical history, family history, social history, and previous encounter notes.   I, Trixie Dredge, am acting as transcriptionist for Dennard Nip, MD.  I have reviewed the above documentation for accuracy and completeness, and I agree with the above. -  Dennard Nip, MD

## 2021-05-02 ENCOUNTER — Emergency Department (HOSPITAL_COMMUNITY)
Admission: EM | Admit: 2021-05-02 | Discharge: 2021-05-02 | Disposition: A | Discharge status: Home / Self Care | Payer: Medicare Other | Attending: Emergency Medicine | Admitting: Emergency Medicine

## 2021-05-02 ENCOUNTER — Emergency Department (HOSPITAL_BASED_OUTPATIENT_CLINIC_OR_DEPARTMENT_OTHER): Payer: Medicare Other

## 2021-05-02 ENCOUNTER — Other Ambulatory Visit: Payer: Self-pay

## 2021-05-02 ENCOUNTER — Emergency Department (HOSPITAL_COMMUNITY): Payer: Medicare Other

## 2021-05-02 DIAGNOSIS — I10 Essential (primary) hypertension: Secondary | ICD-10-CM | POA: Diagnosis not present

## 2021-05-02 DIAGNOSIS — R1032 Left lower quadrant pain: Secondary | ICD-10-CM

## 2021-05-02 DIAGNOSIS — E039 Hypothyroidism, unspecified: Secondary | ICD-10-CM | POA: Diagnosis not present

## 2021-05-02 DIAGNOSIS — M79662 Pain in left lower leg: Secondary | ICD-10-CM | POA: Diagnosis not present

## 2021-05-02 DIAGNOSIS — Z79899 Other long term (current) drug therapy: Secondary | ICD-10-CM | POA: Insufficient documentation

## 2021-05-02 DIAGNOSIS — Z7984 Long term (current) use of oral hypoglycemic drugs: Secondary | ICD-10-CM | POA: Insufficient documentation

## 2021-05-02 DIAGNOSIS — E1169 Type 2 diabetes mellitus with other specified complication: Secondary | ICD-10-CM | POA: Insufficient documentation

## 2021-05-02 DIAGNOSIS — E785 Hyperlipidemia, unspecified: Secondary | ICD-10-CM | POA: Insufficient documentation

## 2021-05-02 DIAGNOSIS — N281 Cyst of kidney, acquired: Secondary | ICD-10-CM | POA: Diagnosis not present

## 2021-05-02 DIAGNOSIS — K5730 Diverticulosis of large intestine without perforation or abscess without bleeding: Secondary | ICD-10-CM | POA: Diagnosis not present

## 2021-05-02 DIAGNOSIS — M549 Dorsalgia, unspecified: Secondary | ICD-10-CM | POA: Diagnosis not present

## 2021-05-02 DIAGNOSIS — K429 Umbilical hernia without obstruction or gangrene: Secondary | ICD-10-CM | POA: Diagnosis not present

## 2021-05-02 DIAGNOSIS — M25552 Pain in left hip: Secondary | ICD-10-CM | POA: Diagnosis not present

## 2021-05-02 DIAGNOSIS — M79605 Pain in left leg: Secondary | ICD-10-CM | POA: Diagnosis not present

## 2021-05-02 LAB — LIPASE, BLOOD: Lipase: 27 U/L (ref 11–51)

## 2021-05-02 LAB — COMPREHENSIVE METABOLIC PANEL
ALT: 18 U/L (ref 0–44)
AST: 18 U/L (ref 15–41)
Albumin: 3.9 g/dL (ref 3.5–5.0)
Alkaline Phosphatase: 84 U/L (ref 38–126)
Anion gap: 10 (ref 5–15)
BUN: 22 mg/dL (ref 8–23)
CO2: 25 mmol/L (ref 22–32)
Calcium: 9.5 mg/dL (ref 8.9–10.3)
Chloride: 100 mmol/L (ref 98–111)
Creatinine, Ser: 0.72 mg/dL (ref 0.44–1.00)
GFR, Estimated: >60 mL/min (ref >60)
Glucose, Bld: 107 mg/dL — ABNORMAL HIGH (ref 70–99)
Potassium: 4.7 mmol/L (ref 3.5–5.1)
Sodium: 135 mmol/L (ref 135–145)
Total Bilirubin: 0.7 mg/dL (ref 0.3–1.2)
Total Protein: 6.8 g/dL (ref 6.5–8.1)

## 2021-05-02 LAB — CBC WITH DIFFERENTIAL/PLATELET
Abs Immature Granulocytes: 0.02 10*3/uL (ref 0.00–0.07)
Basophils Absolute: 0.1 10*3/uL (ref 0.0–0.1)
Basophils Relative: 1 %
Eosinophils Absolute: 0.1 10*3/uL (ref 0.0–0.5)
Eosinophils Relative: 1 %
HCT: 36.6 % (ref 36.0–46.0)
Hemoglobin: 11.5 g/dL — ABNORMAL LOW (ref 12.0–15.0)
Immature Granulocytes: 0 %
Lymphocytes Relative: 33 %
Lymphs Abs: 1.9 10*3/uL (ref 0.7–4.0)
MCH: 28.5 pg (ref 26.0–34.0)
MCHC: 31.4 g/dL (ref 30.0–36.0)
MCV: 90.6 fL (ref 80.0–100.0)
Monocytes Absolute: 0.6 10*3/uL (ref 0.1–1.0)
Monocytes Relative: 11 %
Neutro Abs: 3.1 10*3/uL (ref 1.7–7.7)
Neutrophils Relative %: 54 %
Platelets: 399 10*3/uL (ref 150–400)
RBC: 4.04 MIL/uL (ref 3.87–5.11)
RDW: 14.1 % (ref 11.5–15.5)
WBC: 5.8 10*3/uL (ref 4.0–10.5)
nRBC: 0 % (ref 0.0–0.2)

## 2021-05-02 LAB — CK: Total CK: 131 U/L (ref 38–234)

## 2021-05-02 MED ORDER — FENTANYL CITRATE (PF) 100 MCG/2ML IJ SOLN: 75.0000 ug | INTRAMUSCULAR | Status: DC | PRN

## 2021-05-02 MED ORDER — METHYLPREDNISOLONE 4 MG PO TBPK: ORAL_TABLET | ORAL | 0 refills | Status: DC

## 2021-05-02 MED ORDER — IOHEXOL 300 MG/ML  SOLN
75.0000 mL | Freq: Once | INTRAMUSCULAR | Status: AC | PRN
  Administered 2021-05-02: 75 mL via INTRAVENOUS

## 2021-05-02 MED ORDER — ONDANSETRON HCL 4 MG/2ML IJ SOLN
4.0000 mg | Freq: Once | INTRAMUSCULAR | Status: AC
  Administered 2021-05-02: 4 mg via INTRAVENOUS
  Filled 2021-05-02: qty 2

## 2021-05-02 MED ORDER — CYCLOBENZAPRINE HCL 10 MG PO TABS: 10.0000 mg | ORAL_TABLET | Freq: Two times a day (BID) | ORAL | 0 refills | Status: AC | PRN

## 2021-05-02 MED ORDER — LORAZEPAM 2 MG/ML IJ SOLN
0.5000 mg | Freq: Once | INTRAMUSCULAR | Status: AC
  Administered 2021-05-02: 0.5 mg via INTRAVENOUS
  Filled 2021-05-02: qty 1

## 2021-05-02 MED ORDER — DICLOFENAC SODIUM 1 % EX GEL: 4.0000 g | Freq: Four times a day (QID) | CUTANEOUS | 0 refills | Status: AC

## 2021-05-02 MED ORDER — FENTANYL CITRATE (PF) 100 MCG/2ML IJ SOLN
100.0000 ug | INTRAMUSCULAR | Status: DC | PRN
  Administered 2021-05-02: 100 ug via INTRAVENOUS
  Filled 2021-05-02: qty 2

## 2021-05-02 MED ORDER — SODIUM CHLORIDE 0.9 % IV BOLUS
1000.0000 mL | Freq: Once | INTRAVENOUS | Status: AC
  Administered 2021-05-02: 1000 mL via INTRAVENOUS

## 2021-05-02 NOTE — ED Notes (Signed)
Patient given discharge paperwork and prescriptions. Verbalized understanding of teaching. IV d/c with cath tip intact. Wheeled to exit in NAD.

## 2021-05-02 NOTE — Progress Notes (Signed)
LLE venous duplex has been completed.  Dr. Ronnald Nian unavailable.  Message sent to April, RN with preliminary results.  Results can be found under chart review under CV PROC. 05/02/2021 3:15 PM Isadore Palecek RVT, RDMS

## 2021-05-02 NOTE — ED Provider Notes (Addendum)
I personally evaluated the patient during the encounter and completed a history, physical, procedures, medical decision making to contribute to the overall care of the patient and decision making for the patient briefly, the patient is a 70 y.o. female with history of diabetes who presents to the ED with left lower abdominal pain, left upper leg pain.  Normal pulses on exam.  Has some pain when she flexes at her left hip.  She does not have any abnormal leg swelling.  We will get an ultrasound to rule out DVT.  However will get a CT scan of the abdomen pelvis to look for any kind of diverticulitis or abscess.  She got fentanyl and had a bad reaction to it but feels better after some Ativan.  Lab work otherwise unremarkable.  No significant anemia, electrolyte ab Mody, kidney injury.  CK is normal.  Overall suspect that if imaging is unremarkable this is likely a soft tissue process/arthritis process.  Please see physician assistant's note for further results, evaluation, disposition of the patient.  We will continue to make medical decisions with the PA.  CT scan is overall unremarkable.  Overall patient likely with some sciatic nerve pain.  She is tender within the piriformis muscles and has pain with flexion of her hip.  Suspected DVT study will be negative and overall we will treat with Medrol Dosepak, Flexeril, Tylenol, Motrin and have her follow-up with her primary care doctor.  We will give her crutches to help with ambulation.  No concern for cauda equina as she does not have any loss of bowel or bladder, no urinary retention, no sensation or muscle weakness in her lower extremities.  This chart was dictated using voice recognition software.  Despite best efforts to proofread,  errors can occur which can change the documentation meaning.     EKG Interpretation  Date/Time:  Saturday May 02 2021 10:36:37 EDT Ventricular Rate:  71 PR Interval:  148 QRS Duration: 84 QT Interval:  421 QTC  Calculation: 458 R Axis:   55 Text Interpretation: Sinus rhythm Confirmed by Lennice Sites ([id number]) on 05/02/2021 11:00:17 AM           Lennice Sites, DO 05/02/21 [place], [place], DO 05/02/21 [id number]

## 2021-05-02 NOTE — ED Notes (Addendum)
Patient called out stating, "I'm going to throw up. Someone please help me, someone please help me." This RN notified [place], [place]; verbal order given for 4mg  IV Zofran. Patient called out again before RN could return to room to administer medication, yelling for someone to come and help her. This RN unable to calm patient; Ova [place], PA called to bedside. Patient very anxious, not answering questions directly and continuing to beg someone to help her and stating her mouth is dry. Verbal order given for 0.5 mg IV Ativan. Ativan administered, patient continues to ask RN to help her and is stating that she feels like she is going to die.

## 2021-05-02 NOTE — ED Provider Notes (Incomplete)
[facility] EMERGENCY DEPARTMENT Provider Note   CSN: [id number] Arrival date & time: 05/02/21  0932     History Chief Complaint  Patient presents with  . Leg Pain    Deborah [name] is a 70 y.o. female.  HPI        Past Medical History:  Diagnosis Date  . Back pain   . Death of child 79   Due to cord strangulation.  . Diabetes mellitus without complication ([place])   . Hyperlipidemia   . Hypertension   . Hypothyroidism   . Joint pain   . Osteoarthritis   . Other fatigue   . Shortness of breath on exertion   . Thyroid condition     Patient Active Problem List   Diagnosis Date Noted  . Insomnia 04/30/2019  . Seasonal allergies 03/27/2019  . Prediabetes 01/11/2019  . Hypothyroidism 01/11/2019  . Vitamin D deficiency 01/11/2019  . Cough 01/11/2019  . Fatigue 01/11/2019  . Type 2 diabetes mellitus ([place]) 09/16/2018  . High blood pressure 06/08/2011  . SOB (shortness of breath) 06/08/2011  . Generalized headaches 06/08/2011  . Weight loss 06/08/2011  . Menopause 06/08/2011    Past Surgical History:  Procedure Laterality Date  . APPENDECTOMY  1974  . CARDIAC CATHETERIZATION     two, dates not provided.  . CHOLECYSTECTOMY     confirm date with patient, was it 71?  Marland Kitchen LAPAROSCOPIC TUBAL LIGATION  1987  . THYROIDECTOMY       OB History    Gravida  4   Para  3   Term      Preterm      AB      Living        SAB      IAB      Ectopic      Multiple      Live Births              Family History  Problem Relation Age of Onset  . COPD Mother   . Diabetes Mother   . Hypertension Mother   . Obesity Mother   . Heart disease Father   . Hypertension Father   . Glaucoma Father     Social History   Tobacco Use  . Smoking status: Never Smoker  . Smokeless tobacco: Never Used  Vaping Use  . Vaping Use: Never used  Substance Use Topics  . Alcohol use: Not Currently  . Drug use: No    Home  Medications Prior to Admission medications   Medication Sig Start Date End Date Taking? Authorizing Provider  acetaminophen (TYLENOL) 500 MG tablet Take 1,000 mg by mouth at bedtime.    [provider]  Blood Glucose Monitoring Suppl (ACURA BLOOD GLUCOSE METER) w/Device KIT Check blood sugars twice daily. Please provide patient with meter and supplies that is covered by pt ins. 01/18/19   Minette Brine, FNP  Blood Glucose Monitoring Suppl (ONE TOUCH ULTRA MINI) w/Device KIT Use to check blood sugars 3 times a day. Dx code:e11.65 04/21/20   Minette Brine, FNP  cholecalciferol (VITAMIN D) 1000 units tablet Take 2,000 Units by mouth daily.    [provider]  cyclobenzaprine (FLEXERIL) 10 MG tablet Take 1 tablet (10 mg total) by mouth 3 (three) times daily as needed for muscle spasms. 04/17/20   Minette Brine, FNP  docusate sodium (COLACE) 100 MG capsule Take 100 mg by mouth daily.    [provider]  hydrALAZINE (  APRESOLINE) 100 MG tablet Take 1 tablet (100 mg total) by mouth 2 (two) times daily. 08/18/20   Minette Brine, FNP  hydrochlorothiazide (HYDRODIURIL) 25 MG tablet TAKE 1 TABLET BY MOUTH  DAILY 03/19/21   Skeet Latch, MD  HYDROcodone-homatropine (HYDROMET) 5-1.5 MG/5ML syrup Take 5 mLs by mouth every 6 (six) hours as needed for cough. 04/01/21   Minette Brine, FNP  levothyroxine (SYNTHROID) 100 MCG tablet TAKE 1 TABLET BY MOUTH  DAILY BEFORE BREAKFAST 02/23/21   Minette Brine, FNP  MAGNESIUM PO Take 375 mg by mouth daily.    [provider]  melatonin 5 MG TABS Take 5 mg by mouth at bedtime.    [provider]  metFORMIN (GLUCOPHAGE XR) 500 MG 24 hr tablet Take 1 tablet (500 mg total) by mouth 2 (two) times daily. 04/20/21 05/20/21  Starlyn Skeans, MD  ONETOUCH ULTRA test strip USE WITH METER TO CHECK  BLOOD SUGAR TWO TIMES  DAILY, BEFORE BREAKFAST AND BEFORE DINNER 06/30/20   Minette Brine, FNP  rosuvastatin (CRESTOR) 5 MG tablet TAKE 1 TABLET BY MOUTH   DAILY ON MONDAY, WEDNESDAY, AND FRIDAY 01/23/21   Minette Brine, FNP  spironolactone (ALDACTONE) 25 MG tablet TAKE 1 TABLET BY MOUTH  DAILY 03/19/21   Skeet Latch, MD  Turmeric 500 MG CAPS Take 1 tablet by mouth as needed (Takes occassionally).    [provider]    Allergies    Amlodipine, Aspartame and phenylalanine, Bidil [isosorb dinitrate-hydralazine], Erythromycin, Ibuprofen, Phenylalanine, Prilosec [omeprazole], Tramadol, Valsartan, and Zantac [ranitidine hcl]  Review of Systems   Review of Systems  Physical Exam Updated Vital Signs BP (!) 173/89 (BP Location: Left Arm)   Pulse 92   Temp 97.7 F (36.5 C)   Resp 16   Ht _0  (1.626 m)   Wt 87.5 kg   SpO2 100%   BMI 33.13 kg/m   Physical Exam  ED Results / Procedures / Treatments   Labs (all labs ordered are listed, but only abnormal results are displayed) Labs Reviewed - No data to display  EKG None  Radiology No results found.  Procedures Procedures {Remember to document critical care time when appropriate:1}  Medications Ordered in ED Medications - No data to display  ED Course  I have reviewed the triage vital signs and the nursing notes.  Pertinent labs & imaging results that were available during my care of the patient were reviewed by me and considered in my medical decision making (see chart for details).    MDM Rules/Calculators/A&P                          *** Final Clinical Impression(s) / ED Diagnoses Final diagnoses:  None    Rx / DC Orders ED Discharge Orders    None

## 2021-05-02 NOTE — Discharge Instructions (Addendum)
Please follow-up with your primary care doctor.  I have also given you the information for an orthopedic office.  Your CT scan and ultrasound were without any abnormalities.

## 2021-05-02 NOTE — ED Triage Notes (Signed)
Pt reports pain from lower back into groin and down entirety of L leg. Reports worst pain is in the L ankle. Denies injury.

## 2021-05-02 NOTE — ED Provider Notes (Signed)
[facility] Provider Note   CSN: [id number] Arrival date & time: 05/02/21  0932     History Chief Complaint  Patient presents with  . Leg Pain    Deborah [name] is a 70 y.o. female.  HPI Patient is a 70 year old female with past medical history significant for back pain, fatigue, osteoarthritis, chronic joint pain, HTN, HLD, DM2  Today with left-sided back, left hip, left-sided abdomen and left leg pain.  She states that it began yesterday seem to gradually worsen over the course the day she states she woke up with worsening pain she states that she has been using Aleve over the last 4 hours without much improvement in her pain.  Denies any nausea vomiting urinary frequency urgency or dysuria she denies any trauma to her back abdomen or leg.  States that her bowel movements have been normal.  No melena or hematochezia.    Her past surgical history is notable for appendectomy, cholecystectomy and tubal ligation  No recent surgeries, hospitalization, long travel, hemoptysis, estrogen containing OCP, cancer history.  No unilateral leg swelling.  No history of PE or VTE.      Past Medical History:  Diagnosis Date  . Back pain   . Death of child 60   Due to cord strangulation.  . Diabetes mellitus without complication ([place])   . Hyperlipidemia   . Hypertension   . Hypothyroidism   . Joint pain   . Osteoarthritis   . Other fatigue   . Shortness of breath on exertion   . Thyroid condition     Patient Active Problem List   Diagnosis Date Noted  . Insomnia 04/30/2019  . Seasonal allergies 03/27/2019  . Prediabetes 01/11/2019  . Hypothyroidism 01/11/2019  . Vitamin D deficiency 01/11/2019  . Cough 01/11/2019  . Fatigue 01/11/2019  . Type 2 diabetes mellitus ([place]) 09/16/2018  . High blood pressure 06/08/2011  . SOB (shortness of breath) 06/08/2011  . Generalized headaches 06/08/2011  . Weight loss 06/08/2011  . Menopause  06/08/2011    Past Surgical History:  Procedure Laterality Date  . APPENDECTOMY  1974  . CARDIAC CATHETERIZATION     two, dates not provided.  . CHOLECYSTECTOMY     confirm date with patient, was it 10?  Marland Kitchen LAPAROSCOPIC TUBAL LIGATION  1987  . THYROIDECTOMY       OB History    Gravida  4   Para  3   Term      Preterm      AB      Living        SAB      IAB      Ectopic      Multiple      Live Births              Family History  Problem Relation Age of Onset  . COPD Mother   . Diabetes Mother   . Hypertension Mother   . Obesity Mother   . Heart disease Father   . Hypertension Father   . Glaucoma Father     Social History   Tobacco Use  . Smoking status: Never Smoker  . Smokeless tobacco: Never Used  Vaping Use  . Vaping Use: Never used  Substance Use Topics  . Alcohol use: Not Currently  . Drug use: No    Home Medications Prior to Admission medications   Medication Sig Start Date End Date Taking? Authorizing Provider  cyclobenzaprine (  FLEXERIL) 10 MG tablet Take 1 tablet (10 mg total) by mouth 2 (two) times daily as needed for muscle spasms. 05/02/21  Yes Mylee Falin S, PA  diclofenac Sodium (VOLTAREN) 1 % GEL Apply 4 g topically 4 (four) times daily. 05/02/21  Yes Lucifer Soja, Ova [place] S, PA  methylPREDNISolone (MEDROL DOSEPAK) 4 MG TBPK tablet Take as directed on box 05/02/21  Yes [name] Caccamo, L[place]ore S, PA  acetaminophen (TYLENOL) 500 MG tablet Take 1,000 mg by mouth at bedtime.    [provider]  Blood Glucose Monitoring Suppl (ACURA BLOOD GLUCOSE METER) w/Device KIT Check blood sugars twice daily. Please provide patient with meter and supplies that is covered by pt ins. 01/18/19   Minette Brine, FNP  Blood Glucose Monitoring Suppl (ONE TOUCH ULTRA MINI) w/Device KIT Use to check blood sugars 3 times a day. Dx code:e11.65 04/21/20   Minette Brine, FNP  cholecalciferol (VITAMIN D) 1000 units tablet Take 2,000 Units by mouth daily.    [provider]  docusate sodium (COLACE) 100 MG capsule Take 100 mg by mouth daily.    [provider]  hydrALAZINE (APRESOLINE) 100 MG tablet Take 1 tablet (100 mg total) by mouth 2 (two) times daily. 08/18/20   Minette Brine, FNP  hydrochlorothiazide (HYDRODIURIL) 25 MG tablet TAKE 1 TABLET BY MOUTH  DAILY 03/19/21   Skeet Latch, MD  HYDROcodone-homatropine (HYDROMET) 5-1.5 MG/5ML syrup Take 5 mLs by mouth every 6 (six) hours as needed for cough. 04/01/21   Minette Brine, FNP  levothyroxine (SYNTHROID) 100 MCG tablet TAKE 1 TABLET BY MOUTH  DAILY BEFORE BREAKFAST 02/23/21   Minette Brine, FNP  MAGNESIUM PO Take 375 mg by mouth daily.    [provider]  melatonin 5 MG TABS Take 5 mg by mouth at bedtime.    [provider]  metFORMIN (GLUCOPHAGE XR) 500 MG 24 hr tablet Take 1 tablet (500 mg total) by mouth 2 (two) times daily. 04/20/21 05/20/21  Starlyn Skeans, MD  ONETOUCH ULTRA test strip USE WITH METER TO CHECK  BLOOD SUGAR TWO TIMES  DAILY, BEFORE BREAKFAST AND BEFORE DINNER 06/30/20   Minette Brine, FNP  rosuvastatin (CRESTOR) 5 MG tablet TAKE 1 TABLET BY MOUTH  DAILY ON MONDAY, WEDNESDAY, AND FRIDAY 01/23/21   Minette Brine, FNP  spironolactone (ALDACTONE) 25 MG tablet TAKE 1 TABLET BY MOUTH  DAILY 03/19/21   Skeet Latch, MD  Turmeric 500 MG CAPS Take 1 tablet by mouth as needed (Takes occassionally).    [provider]    Allergies    Amlodipine, Aspartame and phenylalanine, Bidil [isosorb dinitrate-hydralazine], Erythromycin, Ibuprofen, Phenylalanine, Prilosec [omeprazole], Tramadol, Valsartan, and Zantac [ranitidine hcl]  Review of Systems   Review of Systems  Constitutional: Negative for chills and fever.  HENT: Negative for congestion.   Eyes: Negative for pain.  Respiratory: Negative for cough and shortness of breath.   Cardiovascular: Negative for chest pain and leg swelling.  Gastrointestinal: Positive for abdominal pain. Negative for  vomiting.  Genitourinary: Negative for dysuria.  Musculoskeletal: Positive for back pain. Negative for myalgias.       Left leg pain  Skin: Negative for rash.  Neurological: Negative for dizziness and headaches.    Physical Exam Updated Vital Signs BP (!) 145/66   Pulse 79   Temp 97.7 F (36.5 C)   Resp 20   Ht '5\' 4"'  (1.626 m)   Wt 87.5 kg   SpO2 99%   BMI 33.13 kg/m   Physical Exam Vitals  and nursing note reviewed.  Constitutional:      General: She is in acute distress.     Appearance: She is obese.  HENT:     Head: Normocephalic and atraumatic.     Nose: Nose normal.     Mouth/Throat:     Mouth: Mucous membranes are dry.  Eyes:     General: No scleral icterus. Cardiovascular:     Rate and Rhythm: Normal rate and regular rhythm.     Pulses: Normal pulses.     Heart sounds: Normal heart sounds.     Comments: Bilateral DP PT pulses are 3+ and symmetric Pulmonary:     Effort: Pulmonary effort is normal. No respiratory distress.     Breath sounds: No wheezing.  Abdominal:     Palpations: Abdomen is soft.     Tenderness: There is abdominal tenderness. There is no guarding or rebound.     Comments: Chest palpation of the left lower quadrant of the abdomen.  No guarding or rebound.  No CVA tenderness.  Musculoskeletal:     Cervical back: Normal range of motion.     Right lower leg: No edema.     Left lower leg: No edema.     Comments: Right lower extremities are symmetric with no internal or external rotation.  Able to lift both legs individually off the table.  Seems to have more difficulty lifting left leg.  No tenderness palpation of either lower extremity.  Full range of motion at the hip and knee and ankle.    Skin:    General: Skin is warm and dry.     Capillary Refill: Capillary refill takes less than 2 seconds.  Neurological:     Mental Status: She is alert. Mental status is at baseline.  Psychiatric:        Mood and Affect: Mood normal.         Behavior: Behavior normal.     ED Results / Procedures / Treatments   Labs (all labs ordered are listed, but only abnormal results are displayed) Labs Reviewed  CBC WITH DIFFERENTIAL/PLATELET - Abnormal; Notable for the following components:      Result Value   Hemoglobin 11.5 (*)    All other components within normal limits  COMPREHENSIVE METABOLIC PANEL - Abnormal; Notable for the following components:   Glucose, Bld 107 (*)    All other components within normal limits  CK  LIPASE, BLOOD    EKG EKG Interpretation  Date/Time:  Saturday May 02 2021 10:36:37 EDT Ventricular Rate:  71 PR Interval:  148 QRS Duration: 84 QT Interval:  421 QTC Calculation: 458 R Axis:   55 Text Interpretation: Sinus rhythm Confirmed by Lennice Sites ([id number]) on 05/02/2021 11:00:17 AM   Radiology CT ABDOMEN PELVIS W CONTRAST  Result Date: 05/02/2021 CLINICAL DATA:  LLQ pain and left leg pain x 2 days ^62m OMNIPAQUE IOHEXOL 300 MG/ML SOLN a EXAM: CT ABDOMEN AND PELVIS WITH CONTRAST TECHNIQUE: Multidetector CT imaging of the abdomen and pelvis was performed using the standard protocol following bolus administration of intravenous contrast. CONTRAST:  715mOMNIPAQUE IOHEXOL 300 MG/ML  SOLN COMPARISON:  CT abdomen pelvis 05/15/2019 FINDINGS: Lower chest: No acute abnormality. Hepatobiliary: No focal liver abnormality is seen. Status post cholecystectomy. No biliary dilatation. Pancreas: Unremarkable. No pancreatic ductal dilatation or surrounding inflammatory changes. Spleen: Normal in size without focal abnormality. Adrenals/Urinary Tract: Adrenal glands are unremarkable. Small left renal cyst. No renal calculi or hydronephrosis. No suspicious solid mass. Urinary  bladder is unremarkable. Stomach/Bowel: Stomach is within normal limits. Appendix not visualized. No evidence of bowel wall thickening, distention, or inflammatory changes. Colonic diverticula without evidence of diverticulitis. The  Vascular/Lymphatic: Aortic atherosclerosis. No enlarged abdominal or pelvic lymph nodes. Reproductive: Uterus and bilateral adnexa are unremarkable. Other: There is a fat containing umbilical hernia. No abdominopelvic ascites. Musculoskeletal: Degenerative disc disease most pronounced at L5-S1. IMPRESSION: 1. No acute intra-abdominal pathology to explain the patient's left lower quadrant pain. 2. Colonic diverticulosis without evidence of diverticulitis. 3. Fat containing umbilical hernia. 4. Aortic atherosclerosis. Aortic Atherosclerosis (ICD10-I70.0). Electronically Signed   By: Audie Pinto M.D.   On: 05/02/2021 13:42   VAS [place] LOWER EXTREMITY VENOUS (DVT) (ONLY [facility] & [facility] 7a-7p)  Result Date: 05/02/2021  Lower Venous DVT Study Patient Name:  Deborah [name]  Date of Exam:   05/02/2021 Medical Rec #: [id number]           Accession #:    [id number] Date of Birth: May 26, 1951            Patient Gender: F Patient Age:   69Y Exam Location:  [facility] Procedure:      VAS [place] LOWER EXTREMITY VENOUS (DVT) Referring Phys: [id number] [place] --------------------------------------------------------------------------------  Indications: Left sided hip pain.  Comparison Study: No previous exams Performing Technologist: Rogelia Rohrer  Examination Guidelines: A complete evaluation includes B-mode imaging, spectral Doppler, color Doppler, and power Doppler as needed of all accessible portions of each vessel. Bilateral testing is considered an integral part of a complete examination. Limited examinations for reoccurring indications may be performed as noted. The reflux portion of the exam is performed with the patient in reverse Trendelenburg.  +-----+---------------+---------+-----------+----------+--------------+ RIGHTCompressibilityPhasicitySpontaneityPropertiesThrombus Aging +-----+---------------+---------+-----------+----------+--------------+ CFV  Full           Yes      Yes                                  +-----+---------------+---------+-----------+----------+--------------+   +---------+---------------+---------+-----------+----------+--------------+ LEFT     CompressibilityPhasicitySpontaneityPropertiesThrombus Aging +---------+---------------+---------+-----------+----------+--------------+ CFV      Full           Yes      Yes                                 +---------+---------------+---------+-----------+----------+--------------+ SFJ      Full                                                        +---------+---------------+---------+-----------+----------+--------------+ FV Prox  Full           Yes      Yes                                 +---------+---------------+---------+-----------+----------+--------------+ FV Mid   Full           Yes      Yes                                 +---------+---------------+---------+-----------+----------+--------------+ FV DistalFull  Yes      Yes                                 +---------+---------------+---------+-----------+----------+--------------+ PFV      Full                                                        +---------+---------------+---------+-----------+----------+--------------+ POP      Full           Yes      Yes                                 +---------+---------------+---------+-----------+----------+--------------+ PTV      Full                                                        +---------+---------------+---------+-----------+----------+--------------+ PERO     Full                                                        +---------+---------------+---------+-----------+----------+--------------+     Summary: RIGHT: - No evidence of common femoral vein obstruction.  LEFT: - There is no evidence of deep vein thrombosis in the lower extremity. - There is no evidence of superficial venous thrombosis.  - A large cystic structure is found in the popliteal fossa.   *See table(s) above for measurements and observations.    Preliminary     Procedures Procedures   Medications Ordered in ED Medications  ondansetron (ZOFRAN) injection 4 mg (4 mg Intravenous Given 05/02/21 1141)  LORazepam (ATIVAN) injection 0.5 mg (0.5 mg Intravenous Given 05/02/21 1152)  sodium chloride 0.9 % bolus 1,000 mL (0 mLs Intravenous Stopped 05/02/21 1344)  iohexol (OMNIPAQUE) 300 MG/ML solution 75 mL (75 mLs Intravenous Contrast Given 05/02/21 1329)    ED Course  I have reviewed the triage vital signs and the nursing notes.  Pertinent labs & imaging results that were available during my care of the patient were reviewed by me and considered in my medical decision making (see chart for details).  Patient is a 70 year old female originally arriving in pain when initially evaluated but improved with fentanyl however seem to have a adverse reaction to fentanyl and that she came very agitated and anxious.  Responded well to 0.5 mg of Ativan.  Seems to have left lower quadrant abdominal tenderness  Also seems to have some tenderness to the left gluteus.  Clinical Course as of 05/02/21 1552  Sat May 02, 2021  1407 CT scan without significant abnormality.  IMPRESSION: 1. No acute intra-abdominal pathology to explain the patient's left lower quadrant pain. 2. Colonic diverticulosis without evidence of diverticulitis. 3. Fat containing umbilical hernia. 4. Aortic atherosclerosis.  Aortic Atherosclerosis (ICD10-I70.0).   [WF]  4287 CBC without leukocytosis or anemia of any clinical significance.  CMP unremarkable.  Lipase within normal notes. [WF]  1407 CK unremarkable/within normal limits. [WF]  1975 The EKG without any ST-T wave abnormalities.  Normal sinus rhythm.  No arrhythmias. [WF]  8832 Patient is a well-appearing but anxious and in pain.  On initial examination no reproducible leg tenderness palpation forage motion of lower extremity.  She is good bilateral equal 3+  symmetric DP and PT pulses.  Has good sensation and movement of her left lower extremity.  Low suspicion for vascular emergency. Will obtain CT ab pel w contrast  [WF]    Clinical Course User Index [WF] Tedd Sias, PA   I discussed this case with my attending physician who cosigned this note including patient's presenting symptoms, physical exam, and planned diagnostics and interventions. Attending physician stated agreement with plan or made changes to plan which were implemented.  Attending physician assessed patient at bedside.   MDM Rules/Calculators/A&P                          Work-up today has been ultimately benign.  DVT scan was negative.  Ultimately suspect sciatica or piriformis syndrome however was reasonable to evaluate a CT scan which was negative.  Patient given reassurance given Medrol Dosepak, Flexeril and Voltaren gel.  We will follow-up with PCP.  Return precautions given.  Final Clinical Impression(s) / ED Diagnoses Final diagnoses:  Left leg pain  Left lower quadrant abdominal pain    Rx / DC Orders ED Discharge Orders         Ordered    methylPREDNISolone (MEDROL DOSEPAK) 4 MG TBPK tablet        05/02/21 1518    cyclobenzaprine (FLEXERIL) 10 MG tablet  2 times daily PRN        05/02/21 1518    diclofenac Sodium (VOLTAREN) 1 % GEL  4 times daily        05/02/21 1518           Tedd Sias, PA 05/02/21 [place], [place], DO 05/03/21 [phone]

## 2021-05-04 ENCOUNTER — Ambulatory Visit (INDEPENDENT_AMBULATORY_CARE_PROVIDER_SITE_OTHER): Payer: Medicare Other | Admitting: Physician Assistant

## 2021-05-05 ENCOUNTER — Ambulatory Visit (INDEPENDENT_AMBULATORY_CARE_PROVIDER_SITE_OTHER): Payer: Medicare Other | Admitting: Physician Assistant

## 2021-05-14 ENCOUNTER — Other Ambulatory Visit (INDEPENDENT_AMBULATORY_CARE_PROVIDER_SITE_OTHER): Payer: Self-pay | Admitting: Family Medicine

## 2021-05-14 DIAGNOSIS — E119 Type 2 diabetes mellitus without complications: Secondary | ICD-10-CM

## 2021-05-14 NOTE — Telephone Encounter (Signed)
Dr.Beasley 

## 2021-05-14 NOTE — Telephone Encounter (Signed)
Patient is requesting a refill of the following medications: Requested Prescriptions   Pending Prescriptions Disp Refills   metFORMIN (GLUCOPHAGE-XR) 500 MG 24 hr tablet [Pharmacy Med Name: METFORMIN HCL ER 500 MG TABLET] 60 tablet 0    Sig: TAKE 1 TABLET BY MOUTH TWICE A DAY     Last office visit: 04/20/21 Date of last refill: 04/20/21 Last refill amount: 60 Follow up time period per chart: 3 wk No next appt scheduled at this time

## 2021-05-16 ENCOUNTER — Other Ambulatory Visit: Payer: Self-pay | Admitting: Nurse Practitioner

## 2021-05-16 DIAGNOSIS — I10 Essential (primary) hypertension: Secondary | ICD-10-CM

## 2021-05-19 NOTE — Telephone Encounter (Signed)
NTBS.

## 2021-05-21 ENCOUNTER — Ambulatory Visit (INDEPENDENT_AMBULATORY_CARE_PROVIDER_SITE_OTHER): Payer: Medicare Other | Admitting: Family Medicine

## 2021-06-04 ENCOUNTER — Other Ambulatory Visit: Payer: Self-pay | Admitting: Nurse Practitioner

## 2021-06-07 ENCOUNTER — Encounter (INDEPENDENT_AMBULATORY_CARE_PROVIDER_SITE_OTHER): Payer: Self-pay | Admitting: Family Medicine

## 2021-06-07 ENCOUNTER — Other Ambulatory Visit (INDEPENDENT_AMBULATORY_CARE_PROVIDER_SITE_OTHER): Payer: Self-pay | Admitting: Family Medicine

## 2021-06-07 DIAGNOSIS — E119 Type 2 diabetes mellitus without complications: Secondary | ICD-10-CM

## 2021-06-08 ENCOUNTER — Other Ambulatory Visit (INDEPENDENT_AMBULATORY_CARE_PROVIDER_SITE_OTHER): Payer: Self-pay

## 2021-06-09 ENCOUNTER — Other Ambulatory Visit (INDEPENDENT_AMBULATORY_CARE_PROVIDER_SITE_OTHER): Payer: Self-pay

## 2021-06-09 DIAGNOSIS — E119 Type 2 diabetes mellitus without complications: Secondary | ICD-10-CM

## 2021-06-09 MED ORDER — METFORMIN HCL ER 500 MG PO TB24: 500.0000 mg | ORAL_TABLET | Freq: Two times a day (BID) | ORAL | 0 refills | Status: DC

## 2021-06-09 NOTE — Telephone Encounter (Signed)
Ok x 1

## 2021-06-10 ENCOUNTER — Encounter (INDEPENDENT_AMBULATORY_CARE_PROVIDER_SITE_OTHER): Payer: Self-pay | Admitting: Family Medicine

## 2021-06-10 ENCOUNTER — Ambulatory Visit (INDEPENDENT_AMBULATORY_CARE_PROVIDER_SITE_OTHER): Payer: Medicare Other | Admitting: Family Medicine

## 2021-06-10 ENCOUNTER — Other Ambulatory Visit: Payer: Self-pay

## 2021-06-10 VITALS — BP 143/84 | HR 87 | Temp 98.0°F | Ht 64.0 in | Wt 192.0 lb

## 2021-06-10 DIAGNOSIS — Z6835 Body mass index (BMI) 35.0-35.9, adult: Secondary | ICD-10-CM | POA: Diagnosis not present

## 2021-06-10 DIAGNOSIS — I10 Essential (primary) hypertension: Secondary | ICD-10-CM

## 2021-06-10 DIAGNOSIS — E66812 Obesity, class 2: Secondary | ICD-10-CM

## 2021-06-11 ENCOUNTER — Other Ambulatory Visit: Payer: Self-pay | Admitting: Cardiovascular Disease

## 2021-06-11 ENCOUNTER — Ambulatory Visit (INDEPENDENT_AMBULATORY_CARE_PROVIDER_SITE_OTHER): Payer: Medicare Other | Admitting: Family Medicine

## 2021-06-11 DIAGNOSIS — I10 Essential (primary) hypertension: Secondary | ICD-10-CM

## 2021-06-15 NOTE — Progress Notes (Signed)
Chief Complaint:   OBESITY Jermeka is here to discuss her progress with her obesity treatment plan along with follow-up of her obesity related diagnoses. Terriona is on keeping a food journal and adhering to recommended goals of 1300 calories and 75+ grams of protein daily and states she is following her eating plan approximately 60% of the time. Kery states she is doing 0 minutes 0 times per week.  Today's visit was #: 7 Starting weight: 204 lbs Starting date: 01/27/2021 Today's weight: 192 lbs Today's date: 06/10/2021 Total lbs lost to date: 12 Total lbs lost since last in-office visit: 1  Interim History: Devonda continues to do well with weight loss even after taking prednisone. Her hunger is controlled but her exercise is limited due to knee pain.  Subjective:   1. Essential hypertension, benign Ski's blood pressure is above goal today, but she is in pain which is likely contributing to her elevated blood pressure.  Assessment/Plan:   1. Essential hypertension, benign Takia will continue diet, exercise, and her medications and will increase her water intake to improve blood pressure control. We will watch for signs of hypotension as she continues her lifestyle modifications.  2. Obesity with current BMI 33.0 Karmah is currently in the action stage of change. As such, her goal is to continue with weight loss efforts. She has agreed to change to the Category 2 Plan.   Exercise goals: No exercise has been prescribed at this time.  Behavioral modification strategies: increasing lean protein intake, increasing water intake, and decreasing sodium intake.  Maury has agreed to follow-up with our clinic in 3 weeks. She was informed of the importance of frequent follow-up visits to maximize her success with intensive lifestyle modifications for her multiple health conditions.   Objective:   Blood pressure (!) 143/84, pulse 87, temperature 98 F (36.7 C), height 5\' 4"   (1.626 m), weight 192 lb (87.1 kg), SpO2 98 %. Body mass index is 32.96 kg/m.  General: Cooperative, alert, well developed, in no acute distress. HEENT: Conjunctivae and lids unremarkable. Cardiovascular: Regular rhythm.  Lungs: Normal work of breathing. Neurologic: No focal deficits.   Lab Results  Component Value Date   CREATININE 0.72 05/02/2021   BUN 22 05/02/2021   NA 135 05/02/2021   K 4.7 05/02/2021   CL 100 05/02/2021   CO2 25 05/02/2021   Lab Results  Component Value Date   ALT 18 05/02/2021   AST 18 05/02/2021   ALKPHOS 84 05/02/2021   BILITOT 0.7 05/02/2021   Lab Results  Component Value Date   HGBA1C 5.6 02/18/2021   HGBA1C 6.1 (H) 01/27/2021   HGBA1C 6.3 (H) 10/21/2020   HGBA1C 6.2 (H) 08/18/2020   HGBA1C 6.1 (H) 04/17/2020   Lab Results  Component Value Date   INSULIN 31.4 (H) 01/27/2021   Lab Results  Component Value Date   TSH 4.400 01/27/2021   Lab Results  Component Value Date   CHOL 168 02/18/2021   HDL 49 02/18/2021   LDLCALC 105 (H) 02/18/2021   TRIG 76 02/18/2021   CHOLHDL 3.4 02/18/2021   Lab Results  Component Value Date   WBC 5.8 05/02/2021   HGB 11.5 (L) 05/02/2021   HCT 36.6 05/02/2021   MCV 90.6 05/02/2021   PLT 399 05/02/2021   No results found for: IRON, TIBC, FERRITIN  Attestation Statements:   Reviewed by clinician on day of visit: allergies, medications, problem list, medical history, surgical history, family history, social history, and previous  encounter notes.  Time spent on visit including pre-visit chart review and post-visit care and charting was 22 minutes.    I, Trixie Dredge, am acting as transcriptionist for Dennard Nip, MD.  I have reviewed the above documentation for accuracy and completeness, and I agree with the above. -  Dennard Nip, MD

## 2021-07-01 ENCOUNTER — Other Ambulatory Visit (INDEPENDENT_AMBULATORY_CARE_PROVIDER_SITE_OTHER): Payer: Self-pay | Admitting: Family Medicine

## 2021-07-01 DIAGNOSIS — E119 Type 2 diabetes mellitus without complications: Secondary | ICD-10-CM

## 2021-07-01 NOTE — Telephone Encounter (Signed)
Pt last seen by Dr. Beasley.  

## 2021-07-02 ENCOUNTER — Encounter (INDEPENDENT_AMBULATORY_CARE_PROVIDER_SITE_OTHER): Payer: Self-pay | Admitting: Family Medicine

## 2021-07-02 ENCOUNTER — Ambulatory Visit (INDEPENDENT_AMBULATORY_CARE_PROVIDER_SITE_OTHER): Payer: Medicare Other | Admitting: Family Medicine

## 2021-07-02 ENCOUNTER — Other Ambulatory Visit: Payer: Self-pay

## 2021-07-02 ENCOUNTER — Other Ambulatory Visit: Payer: Self-pay | Admitting: Nurse Practitioner

## 2021-07-02 VITALS — BP 149/83 | HR 93 | Temp 98.0°F | Ht 64.0 in | Wt 192.0 lb

## 2021-07-02 DIAGNOSIS — E119 Type 2 diabetes mellitus without complications: Secondary | ICD-10-CM | POA: Diagnosis not present

## 2021-07-02 DIAGNOSIS — Z6834 Body mass index (BMI) 34.0-34.9, adult: Secondary | ICD-10-CM

## 2021-07-02 DIAGNOSIS — E669 Obesity, unspecified: Secondary | ICD-10-CM

## 2021-07-02 DIAGNOSIS — E559 Vitamin D deficiency, unspecified: Secondary | ICD-10-CM

## 2021-07-02 MED ORDER — METFORMIN HCL ER 500 MG PO TB24: 500.0000 mg | ORAL_TABLET | Freq: Two times a day (BID) | ORAL | 0 refills | Status: DC

## 2021-07-02 NOTE — Telephone Encounter (Signed)
LAST APPOINTMENT DATE: 06/10/2021  NEXT APPOINTMENT DATE: 07/02/2021   [place], [address] [place] [address] Phone: [phone] Fax: [phone]  [id number] - [place], [address] [place] [address] Phone: [phone] Fax: [phone]  [facility]  ([place] - [place], [address] [place] [address] Phone: [phone] Fax: [phone]  [facility] - [place], [place] - [address] [place] [address] Phone: [phone] Fax: [phone]  Patient is requesting a refill of the following medications: Requested Prescriptions   Pending Prescriptions Disp Refills   metFORMIN (GLUCOPHAGE-XR) 500 MG 24 hr tablet [Pharmacy Med Name: METFORMIN HCL ER 500 MG TABLET] 60 tablet 0    Sig: TAKE 1 TABLET BY MOUTH TWICE A DAY    Date last filled: 06/09/21 Previously prescribed by [facility]   Lab Results  Component Value Date   HGBA1C 5.6 02/18/2021   HGBA1C 6.1 (H) 01/27/2021   HGBA1C 6.3 (H) 10/21/2020   Lab Results  Component Value Date   MICROALBUR 10 10/21/2020   [place] 105 (H) 02/18/2021   CREATININE 0.72 05/02/2021   Lab Results  Component Value Date   VD25OH 55.1 02/18/2021   VD25OH 50.5 01/27/2021   VD25OH 46.4 10/21/2020    BP Readings from Last 3 Encounters:  06/10/21 (!) 143/84  05/02/21 (!) 145/66  04/20/21 133/87

## 2021-07-03 LAB — CMP14+EGFR
ALT: 15 IU/L (ref 0–32)
AST: 14 IU/L (ref 0–40)
Albumin/Globulin Ratio: 2 (ref 1.2–2.2)
Albumin: 4.7 g/dL (ref 3.8–4.8)
Alkaline Phosphatase: 93 IU/L (ref 44–121)
BUN/Creatinine Ratio: 23 (ref 12–28)
BUN: 15 mg/dL (ref 8–27)
Bilirubin Total: 0.3 mg/dL (ref 0.0–1.2)
CO2: 24 mmol/L (ref 20–29)
Calcium: 9.6 mg/dL (ref 8.7–10.3)
Chloride: 95 mmol/L — ABNORMAL LOW (ref 96–106)
Creatinine, Ser: 0.65 mg/dL (ref 0.57–1.00)
Globulin, Total: 2.4 g/dL (ref 1.5–4.5)
Glucose: 85 mg/dL (ref 65–99)
Potassium: 4.3 mmol/L (ref 3.5–5.2)
Sodium: 134 mmol/L (ref 134–144)
Total Protein: 7.1 g/dL (ref 6.0–8.5)
eGFR: 95 mL/min/{1.73_m2} (ref >59)

## 2021-07-03 LAB — HEMOGLOBIN A1C
Est. average glucose Bld gHb Est-mCnc: 123 mg/dL
Hgb A1c MFr Bld: 5.9 % — ABNORMAL HIGH (ref 4.8–5.6)

## 2021-07-03 LAB — VITAMIN D 25 HYDROXY (VIT D DEFICIENCY, FRACTURES): Vit D, 25-Hydroxy: 61.9 ng/mL (ref 30.0–100.0)

## 2021-07-05 ENCOUNTER — Encounter: Payer: Self-pay | Admitting: Nurse Practitioner

## 2021-07-06 ENCOUNTER — Other Ambulatory Visit: Payer: Self-pay

## 2021-07-06 ENCOUNTER — Other Ambulatory Visit: Payer: Self-pay | Admitting: Nurse Practitioner

## 2021-07-06 DIAGNOSIS — E119 Type 2 diabetes mellitus without complications: Secondary | ICD-10-CM

## 2021-07-06 DIAGNOSIS — R053 Chronic cough: Secondary | ICD-10-CM

## 2021-07-06 MED ORDER — ROSUVASTATIN CALCIUM 5 MG PO TABS: ORAL_TABLET | ORAL | 3 refills | Status: DC

## 2021-07-06 MED ORDER — HYDROCODONE BIT-HOMATROP MBR 5-1.5 MG/5ML PO SOLN: 5.0000 mL | Freq: Four times a day (QID) | ORAL | 0 refills | Status: DC | PRN

## 2021-07-13 NOTE — Progress Notes (Signed)
Chief Complaint:   OBESITY Teniyah is here to discuss her progress with her obesity treatment plan along with follow-up of her obesity related diagnoses. Estephanie is on the Category 2 Plan and states she is following her eating plan approximately 85% of the time. Kimbly states she is doing 0 minutes 0 times per week.  Today's visit was #: 8 Starting weight: 204 lbs Starting date: 01/27/2021 Today's weight: 192 lbs Today's date: 07/02/2021 Total lbs lost to date: 12 Total lbs lost since last in-office visit: 0  Interim History: Astryd continues to work on diet and weight loss. She notes some cravings but she is working on minimizing snacking.  Subjective:   1. Type 2 diabetes mellitus without complication, without long-term current use of insulin (HCC) Brendalee is stable on metformin, and her fasting BGs mostly range between 80-100. She is due for labs.  2. Vitamin D deficiency Maxcine is stable on Vit D, an she is due for labs.  Assessment/Plan:   1. Type 2 diabetes mellitus without complication, without long-term current use of insulin (HCC) We will check labs today, and we will refill metformin for 1 month. Good blood sugar control is important to decrease the likelihood of diabetic complications such as nephropathy, neuropathy, limb loss, blindness, coronary artery disease, and death. Intensive lifestyle modification including diet, exercise and weight loss are the first line of treatment for diabetes.   - metFORMIN (GLUCOPHAGE XR) 500 MG 24 hr tablet; Take 1 tablet (500 mg total) by mouth 2 (two) times daily.  Dispense: 60 tablet; Refill: 0 - CMP14+EGFR - Hemoglobin A1c  2. Vitamin D deficiency Low Vitamin D level contributes to fatigue and are associated with obesity, breast, and colon cancer. We will check labs today. Opha will continue taking OTC Vitamin D and will follow-up for routine testing of Vitamin D, at least 2-3 times per year to avoid over-replacement.  -  VITAMIN D 25 Hydroxy (Vit-D Deficiency, Fractures)  3. Obesity with current BMI 33.1 Amesha is currently in the action stage of change. As such, her goal is to continue with weight loss efforts. She has agreed to the Category 2 Plan.   Behavioral modification strategies: increasing lean protein intake and meal planning and cooking strategies.  Pessy has agreed to follow-up with our clinic in 3 to 4 weeks. She was informed of the importance of frequent follow-up visits to maximize her success with intensive lifestyle modifications for her multiple health conditions.   Vanya was informed we would discuss her lab results at her next visit unless there is a critical issue that needs to be addressed sooner. Tuwana agreed to keep her next visit at the agreed upon time to discuss these results.  Objective:   Blood pressure (!) 149/83, pulse 93, temperature 98 F (36.7 C), height '5\' 4"'  (1.626 m), weight 192 lb (87.1 kg), SpO2 98 %. Body mass index is 32.96 kg/m.  General: Cooperative, alert, well developed, in no acute distress. HEENT: Conjunctivae and lids unremarkable. Cardiovascular: Regular rhythm.  Lungs: Normal work of breathing. Neurologic: No focal deficits.   Lab Results  Component Value Date   CREATININE 0.65 07/02/2021   BUN 15 07/02/2021   NA 134 07/02/2021   K 4.3 07/02/2021   CL 95 (L) 07/02/2021   CO2 24 07/02/2021   Lab Results  Component Value Date   ALT 15 07/02/2021   AST 14 07/02/2021   ALKPHOS 93 07/02/2021   BILITOT 0.3 07/02/2021   Lab Results  Component Value Date   HGBA1C 5.9 (H) 07/02/2021   HGBA1C 5.6 02/18/2021   HGBA1C 6.1 (H) 01/27/2021   HGBA1C 6.3 (H) 10/21/2020   HGBA1C 6.2 (H) 08/18/2020   Lab Results  Component Value Date   INSULIN 31.4 (H) 01/27/2021   Lab Results  Component Value Date   TSH 4.400 01/27/2021   Lab Results  Component Value Date   CHOL 168 02/18/2021   HDL 49 02/18/2021   LDLCALC 105 (H) 02/18/2021   TRIG 76  02/18/2021   CHOLHDL 3.4 02/18/2021   Lab Results  Component Value Date   VD25OH 61.9 07/02/2021   VD25OH 55.1 02/18/2021   VD25OH 50.5 01/27/2021   Lab Results  Component Value Date   WBC 5.8 05/02/2021   HGB 11.5 (L) 05/02/2021   HCT 36.6 05/02/2021   MCV 90.6 05/02/2021   PLT 399 05/02/2021   No results found for: IRON, TIBC, FERRITIN  Obesity Behavioral Intervention:   Approximately 15 minutes were spent on the discussion below.  ASK: We discussed the diagnosis of obesity with [name] today and [place] agreed to give [place] permission to discuss obesity behavioral modification therapy today.  ASSESS: Latana has the diagnosis of obesity and her BMI today is 32.94. Kewana is in the action stage of change.   ADVISE: Dayane was educated on the multiple health risks of obesity as well as the benefit of weight loss to improve her health. She was advised of the need for long term treatment and the importance of lifestyle modifications to improve her current health and to decrease her risk of future health problems.  AGREE: Multiple dietary modification options and treatment options were discussed and Kippy agreed to follow the recommendations documented in the above note.  ARRANGE: Kirstine was educated on the importance of frequent visits to treat obesity as outlined per CMS and USPSTF guidelines and agreed to schedule her next follow up appointment today.  Attestation Statements:   Reviewed by clinician on day of visit: allergies, medications, problem list, medical history, surgical history, family history, social history, and previous encounter notes.   I, Trixie Dredge, am acting as transcriptionist for Dennard Nip, MD.  I have reviewed the above documentation for accuracy and completeness, and I agree with the above. -  Dennard Nip, MD

## 2021-07-23 ENCOUNTER — Ambulatory Visit (INDEPENDENT_AMBULATORY_CARE_PROVIDER_SITE_OTHER): Payer: Medicare Other | Admitting: Family Medicine

## 2021-07-23 ENCOUNTER — Other Ambulatory Visit: Payer: Self-pay

## 2021-07-23 ENCOUNTER — Encounter (INDEPENDENT_AMBULATORY_CARE_PROVIDER_SITE_OTHER): Payer: Self-pay | Admitting: Family Medicine

## 2021-07-23 VITALS — BP 136/74 | HR 70 | Temp 97.8°F | Ht 64.0 in | Wt 192.0 lb

## 2021-07-23 DIAGNOSIS — E119 Type 2 diabetes mellitus without complications: Secondary | ICD-10-CM

## 2021-07-23 DIAGNOSIS — E559 Vitamin D deficiency, unspecified: Secondary | ICD-10-CM | POA: Diagnosis not present

## 2021-07-23 DIAGNOSIS — Z6835 Body mass index (BMI) 35.0-35.9, adult: Secondary | ICD-10-CM | POA: Diagnosis not present

## 2021-07-23 MED ORDER — METFORMIN HCL ER 500 MG PO TB24: 500.0000 mg | ORAL_TABLET | Freq: Two times a day (BID) | ORAL | 0 refills | Status: DC

## 2021-07-24 ENCOUNTER — Other Ambulatory Visit (INDEPENDENT_AMBULATORY_CARE_PROVIDER_SITE_OTHER): Payer: Self-pay | Admitting: Family Medicine

## 2021-07-24 DIAGNOSIS — E119 Type 2 diabetes mellitus without complications: Secondary | ICD-10-CM

## 2021-07-27 NOTE — Progress Notes (Signed)
Chief Complaint:   OBESITY Deborah [name] is here to discuss her progress with her obesity treatment plan along with follow-up of her obesity related diagnoses. Deborah [name] is on the Category 2 Plan and states she is following her eating plan approximately 85% of the time. Deborah [name] states she is doing 0 minutes 0 times per week.  Today's visit was #: 9 Starting weight: 204 lbs Starting date: 02/06/2021 Today's weight: 192 lbs Today's date: 07/23/2021 Total lbs lost to date: 12 lbs Total lbs lost since last in-office visit: 0  Interim History: Deborah [name] is bored with the food. She is mostly tired of the breakfast. She has a very hard time following the plan on the weekend.  She does not eat after 7 pm.  Subjective:   1. Type 2 diabetes mellitus without complication, without long-term current use of insulin (HCC) Deborah [name] has had diarrhea in the past on Ozempic. She notes fatique and feels she felt better on B12 supplement. Her diabetes is well controlled. She denies Hypoglycemia. She is on Metformin twice daily #60 with no refills.  Lab Results  Component Value Date   HGBA1C 5.9 (H) 07/02/2021   HGBA1C 5.6 02/18/2021   HGBA1C 6.1 (H) 01/27/2021   Lab Results  Component Value Date   MICROALBUR 10 10/21/2020   LDLCALC 105 (H) 02/18/2021   CREATININE 0.65 07/02/2021   Lab Results  Component Value Date   INSULIN 31.4 (H) 01/27/2021    2. Vitamin D deficiency Deborah [name]'s Vitamin D is at goal. She is on 2000 IU Vitamin D daily.  Lab Results  Component Value Date   VD25OH 61.9 07/02/2021   VD25OH 55.1 02/18/2021   VD25OH 50.5 01/27/2021    Assessment/Plan:   1. Type 2 diabetes mellitus without complication, without long-term current use of insulin (HCC)  We will refill Metformin XR 500 mg twice daily #60 with no refills. Deborah [name] may take OTC B12 1000 mcg daily. - metFORMIN (GLUCOPHAGE XR) 500 MG 24 hr tablet; Take 1 tablet (500 mg total) by mouth 2 (two) times daily.  Dispense: 60  tablet; Refill: 0  2. Vitamin D deficiency Deborah [name] will continue OTC Vitamin D.    3. Obesity with current BMI 32.94 Deborah [name] is currently in the action stage of change. As such, her goal is to continue with weight loss efforts. She has agreed to the Category 2 Plan.   Deborah [name] added lunch and breakfast options. She may have to strips of regular bacon at breakfast. She may have K[place] oatmeal.  Exercise goals: Older adults should do exercises that maintain or improve balance if they are at risk of falling. Encouraged Deborah [name] to start back exercising to help with fatigue.  Behavioral modification strategies: meal planning and cooking strategies.  Deborah [name] has agreed to follow-up with our clinic in 3 weeks.  Objective:   Blood pressure 136/74, pulse 70, temperature 97.8 F (36.6 C), height '5\' 4"'$  (1.626 m), weight 192 lb (87.1 kg), SpO2 97 %. Body mass index is 32.96 kg/m.  General: Cooperative, alert, well developed, in no acute distress. HEENT: Conjunctivae and lids unremarkable. Cardiovascular: Regular rhythm.  Lungs: Normal work of breathing. Neurologic: No focal deficits.   Lab Results  Component Value Date   CREATININE 0.65 07/02/2021   BUN 15 07/02/2021   NA 134 07/02/2021   K 4.3 07/02/2021   CL 95 (L) 07/02/2021   CO2 24 07/02/2021   Lab Results  Component Value Date   ALT 15 07/02/2021   AST 14  07/02/2021   ALKPHOS 93 07/02/2021   BILITOT 0.3 07/02/2021   Lab Results  Component Value Date   HGBA1C 5.9 (H) 07/02/2021   HGBA1C 5.6 02/18/2021   HGBA1C 6.1 (H) 01/27/2021   HGBA1C 6.3 (H) 10/21/2020   HGBA1C 6.2 (H) 08/18/2020   Lab Results  Component Value Date   INSULIN 31.4 (H) 01/27/2021   Lab Results  Component Value Date   TSH 4.400 01/27/2021   Lab Results  Component Value Date   CHOL 168 02/18/2021   HDL 49 02/18/2021   LDLCALC 105 (H) 02/18/2021   TRIG 76 02/18/2021   CHOLHDL 3.4 02/18/2021   Lab Results  Component Value Date   VD25OH  61.9 07/02/2021   VD25OH 55.1 02/18/2021   VD25OH 50.5 01/27/2021   Lab Results  Component Value Date   WBC 5.8 05/02/2021   HGB 11.5 (L) 05/02/2021   HCT 36.6 05/02/2021   MCV 90.6 05/02/2021   PLT 399 05/02/2021   No results found for: IRON, TIBC, FERRITIN  Attestation Statements:   Reviewed by clinician on day of visit: allergies, medications, problem list, medical history, surgical history, family history, social history, and previous encounter notes.  I, Lizbeth Bark, RMA, am acting as Location manager for [employer], [place].   I have reviewed the above documentation for accuracy and completeness, and I agree with the above. -  Deborah [name] Fick, FNP

## 2021-07-28 DIAGNOSIS — H401131 Primary open-angle glaucoma, bilateral, mild stage: Secondary | ICD-10-CM | POA: Diagnosis not present

## 2021-07-28 DIAGNOSIS — H53423 Scotoma of blind spot area, bilateral: Secondary | ICD-10-CM | POA: Diagnosis not present

## 2021-07-28 DIAGNOSIS — E119 Type 2 diabetes mellitus without complications: Secondary | ICD-10-CM | POA: Diagnosis not present

## 2021-07-28 DIAGNOSIS — Z9889 Other specified postprocedural states: Secondary | ICD-10-CM | POA: Diagnosis not present

## 2021-07-28 DIAGNOSIS — H43813 Vitreous degeneration, bilateral: Secondary | ICD-10-CM | POA: Diagnosis not present

## 2021-07-28 DIAGNOSIS — H2513 Age-related nuclear cataract, bilateral: Secondary | ICD-10-CM | POA: Diagnosis not present

## 2021-08-13 ENCOUNTER — Encounter (INDEPENDENT_AMBULATORY_CARE_PROVIDER_SITE_OTHER): Payer: Self-pay | Admitting: Family Medicine

## 2021-08-13 ENCOUNTER — Ambulatory Visit (INDEPENDENT_AMBULATORY_CARE_PROVIDER_SITE_OTHER): Payer: Medicare Other | Admitting: Family Medicine

## 2021-08-13 ENCOUNTER — Other Ambulatory Visit: Payer: Self-pay

## 2021-08-13 VITALS — BP 131/71 | HR 75 | Temp 97.7°F | Ht 64.0 in | Wt 191.0 lb

## 2021-08-13 DIAGNOSIS — E119 Type 2 diabetes mellitus without complications: Secondary | ICD-10-CM

## 2021-08-13 DIAGNOSIS — Z6835 Body mass index (BMI) 35.0-35.9, adult: Secondary | ICD-10-CM | POA: Diagnosis not present

## 2021-08-13 NOTE — Progress Notes (Signed)
Chief Complaint:   OBESITY Deborah [name] is here to discuss her progress with her obesity treatment plan along with follow-up of her obesity related diagnoses. Deborah [name] is on the Category 2 Plan and states she is following her eating plan approximately 80% of the time. Deborah [name] states she is walking for 10 minutes 2 times per week.  Today's visit was #: 10 Starting weight: 204 lbs Starting date: 02/06/2021 Today's weight: 191 lbs Today's date: 08/13/2021 Total lbs lost to date: 13 lbs Total lbs lost since last in-office visit: 1 lb  Interim History: Deborah [name] has enjoyed having more breakfast options as discussed at last office visit. She would still like to have more variety in her food choices.  Subjective:   1. Type 2 diabetes mellitus without complication, without long-term current use of insulin ([place]) Deborah [name]'s diabetes is well controlled. Her last A1C was 5.9. Her CBGs - 100-115. She is currently on Metformin XR 500 mg twice daily. She denies hypoglycemia.  Lab Results  Component Value Date   HGBA1C 5.9 (H) 07/02/2021   HGBA1C 5.6 02/18/2021   HGBA1C 6.1 (H) 01/27/2021   Lab Results  Component Value Date   MICROALBUR 10 10/21/2020   LDLCALC 105 (H) 02/18/2021   CREATININE 0.65 07/02/2021   Lab Results  Component Value Date   INSULIN 31.4 (H) 01/27/2021    Assessment/Plan:   1. Type 2 diabetes mellitus without complication, without long-term current use of insulin (HCC) Deborah [name] will continue Metformin.   2. Obesity with current BMI 32.77 Deborah [name] is currently in the action stage of change. As such, her goal is to continue with weight loss efforts. She has agreed to keeping a food journal and adhering to recommended goals of 1100-1300 calories and 75 grams of protein.  Handouts: Journaling and recipes, protein content of foods. Deborah [name] will journal  with paper and pencil  Exercise goals:  As is.  Deborah [name] will try to check into Silver Sneakers.  Behavioral modification  strategies: increasing lean protein intake and planning for success.  Deborah [name] has agreed to follow-up with our clinic in 3 weeks.  Objective:   Blood pressure 131/71, pulse 75, temperature 97.7 F (36.5 C), height '5\' 4"'$  (1.626 m), weight 191 lb (86.6 kg), SpO2 99 %. Body mass index is 32.79 kg/m.  General: Cooperative, alert, well developed, in no acute distress. HEENT: Conjunctivae and lids unremarkable. Cardiovascular: Regular rhythm.  Lungs: Normal work of breathing. Neurologic: No focal deficits.   Lab Results  Component Value Date   CREATININE 0.65 07/02/2021   BUN 15 07/02/2021   NA 134 07/02/2021   K 4.3 07/02/2021   CL 95 (L) 07/02/2021   CO2 24 07/02/2021   Lab Results  Component Value Date   ALT 15 07/02/2021   AST 14 07/02/2021   ALKPHOS 93 07/02/2021   BILITOT 0.3 07/02/2021   Lab Results  Component Value Date   HGBA1C 5.9 (H) 07/02/2021   HGBA1C 5.6 02/18/2021   HGBA1C 6.1 (H) 01/27/2021   HGBA1C 6.3 (H) 10/21/2020   HGBA1C 6.2 (H) 08/18/2020   Lab Results  Component Value Date   INSULIN 31.4 (H) 01/27/2021   Lab Results  Component Value Date   TSH 4.400 01/27/2021   Lab Results  Component Value Date   CHOL 168 02/18/2021   HDL 49 02/18/2021   LDLCALC 105 (H) 02/18/2021   TRIG 76 02/18/2021   CHOLHDL 3.4 02/18/2021   Lab Results  Component Value Date   VD25OH 61.9 07/02/2021  VD25OH 55.1 02/18/2021   VD25OH 50.5 01/27/2021   Lab Results  Component Value Date   WBC 5.8 05/02/2021   HGB 11.5 (L) 05/02/2021   HCT 36.6 05/02/2021   MCV 90.6 05/02/2021   PLT 399 05/02/2021   No results found for: IRON, TIBC, FERRITIN  Obesity Behavioral Intervention:   Approximately 15 minutes were spent on the discussion below.  ASK: We discussed the diagnosis of obesity with Deborah [name] today and [place] agreed to give [place] permission to discuss obesity behavioral modification therapy today.  ASSESS: Debbrah has the diagnosis of obesity and her BMI  today is 32.9. [place] is in the action stage of change.   ADVISE: Brittne was educated on the multiple health risks of obesity as well as the benefit of weight loss to improve her health. She was advised of the need for long term treatment and the importance of lifestyle modifications to improve her current health and to decrease her risk of future health problems.  AGREE: Multiple dietary modification options and treatment options were discussed and Deborah [name] agreed to follow the recommendations documented in the above note.  ARRANGE: [place] was educated on the importance of frequent visits to treat obesity as outlined per CMS and USPSTF guidelines and agreed to schedule her next follow up appointment today.  Attestation Statements:   Reviewed by clinician on day of visit: allergies, medications, problem list, medical history, surgical history, family history, social history, and previous encounter notes.  I, Lizbeth Bark, RMA, am acting as Location manager for [employer], [place].   I have reviewed the above documentation for accuracy and completeness, and I agree with the above. -  Georgianne Fick, FNP

## 2021-08-15 ENCOUNTER — Encounter (INDEPENDENT_AMBULATORY_CARE_PROVIDER_SITE_OTHER): Payer: Self-pay | Admitting: Family Medicine

## 2021-08-19 ENCOUNTER — Other Ambulatory Visit (INDEPENDENT_AMBULATORY_CARE_PROVIDER_SITE_OTHER): Payer: Self-pay | Admitting: Family Medicine

## 2021-08-19 DIAGNOSIS — E119 Type 2 diabetes mellitus without complications: Secondary | ICD-10-CM

## 2021-08-19 NOTE — Telephone Encounter (Signed)
Last OV with Dawn 

## 2021-09-02 ENCOUNTER — Other Ambulatory Visit: Payer: Self-pay

## 2021-09-02 ENCOUNTER — Ambulatory Visit (INDEPENDENT_AMBULATORY_CARE_PROVIDER_SITE_OTHER): Payer: Medicare Other

## 2021-09-02 ENCOUNTER — Other Ambulatory Visit: Payer: Self-pay | Admitting: Cardiovascular Disease

## 2021-09-02 VITALS — BP 138/78 | HR 89 | Temp 98.0°F | Ht 62.6 in | Wt 199.2 lb

## 2021-09-02 DIAGNOSIS — I10 Essential (primary) hypertension: Secondary | ICD-10-CM

## 2021-09-02 DIAGNOSIS — Z0000 Encounter for general adult medical examination without abnormal findings: Secondary | ICD-10-CM

## 2021-09-02 NOTE — Patient Instructions (Signed)
Deborah [name] , Thank you for taking time to come for your Medicare Wellness Visit. I appreciate your ongoing commitment to your health goals. Please review the following plan we discussed and let me know if I can assist you in the future.   Screening recommendations/referrals: Colonoscopy: decline Mammogram: completed 12/11/2020 Bone Density: completed 12/10/2020 Recommended yearly ophthalmology/optometry visit for glaucoma screening and checkup Recommended yearly dental visit for hygiene and checkup  Vaccinations: Influenza vaccine: due Pneumococcal vaccine: completed 10/17/2018 Tdap vaccine: completed 10/19/2019, due 10/18/2029 Shingles vaccine: discussed   Covid-19: 09/16/2020, 01/09/2020, 12/22/2019  Advanced directives: Please bring a copy of your POA (Power of Attorney) and/or Living Will to your next appointment.   Conditions/risks identified: none  Next appointment: Follow up in one year for your annual wellness visit    Preventive Care 65 Years and Older, Female Preventive care refers to lifestyle choices and visits with your health care provider that can promote health and wellness. What does preventive care include? A yearly physical exam. This is also called an annual well check. Dental exams once or twice a year. Routine eye exams. Ask your health care provider how often you should have your eyes checked. Personal lifestyle choices, including: Daily care of your teeth and gums. Regular physical activity. Eating a healthy diet. Avoiding tobacco and drug use. Limiting alcohol use. Practicing safe sex. Taking low-dose aspirin every day. Taking vitamin and mineral supplements as recommended by your health care provider. What happens during an annual well check? The services and screenings done by your health care provider during your annual well check will depend on your age, overall health, lifestyle risk factors, and family history of disease. Counseling  Your health  care provider may ask you questions about your: Alcohol use. Tobacco use. Drug use. Emotional well-being. Home and relationship well-being. Sexual activity. Eating habits. History of falls. Memory and ability to understand (cognition). Work and work Statistician. Reproductive health. Screening  You may have the following tests or measurements: Height, weight, and BMI. Blood pressure. Lipid and cholesterol levels. These may be checked every 5 years, or more frequently if you are over 39 years old. Skin check. Lung cancer screening. You may have this screening every year starting at age 73 if you have a 30-pack-year history of smoking and currently smoke or have quit within the past 15 years. Fecal occult blood test (FOBT) of the stool. You may have this test every year starting at age 48. Flexible sigmoidoscopy or colonoscopy. You may have a sigmoidoscopy every 5 years or a colonoscopy every 10 years starting at age 67. Hepatitis C blood test. Hepatitis B blood test. Sexually transmitted disease (STD) testing. Diabetes screening. This is done by checking your blood sugar (glucose) after you have not eaten for a while (fasting). You may have this done every 1-3 years. Bone density scan. This is done to screen for osteoporosis. You may have this done starting at age 73. Mammogram. This may be done every 1-2 years. Talk to your health care provider about how often you should have regular mammograms. Talk with your health care provider about your test results, treatment options, and if necessary, the need for more tests. Vaccines  Your health care provider may recommend certain vaccines, such as: Influenza vaccine. This is recommended every year. Tetanus, diphtheria, and acellular pertussis (Tdap, Td) vaccine. You may need a Td booster every 10 years. Zoster vaccine. You may need this after age 64. Pneumococcal 13-valent conjugate (PCV13) vaccine. One dose is recommended  after age  61. Pneumococcal polysaccharide (PPSV23) vaccine. One dose is recommended after age 62. Talk to your health care provider about which screenings and vaccines you need and how often you need them. This information is not intended to replace advice given to you by your health care provider. Make sure you discuss any questions you have with your health care provider. Document Released: 01/02/2016 Document Revised: 08/25/2016 Document Reviewed: 10/07/2015 Elsevier Interactive Patient Education  2017 [place] Prevention in the Home Falls can cause injuries. They can happen to people of all ages. There are many things you can do to make your home safe and to help prevent falls. What can I do on the outside of my home? Regularly fix the edges of walkways and driveways and fix any cracks. Remove anything that might make you trip as you walk through a door, such as a raised step or threshold. Trim any bushes or trees on the path to your home. Use bright outdoor lighting. Clear any walking paths of anything that might make someone trip, such as rocks or tools. Regularly check to see if handrails are loose or broken. Make sure that both sides of any steps have handrails. Any raised decks and porches should have guardrails on the edges. Have any leaves, snow, or ice cleared regularly. Use sand or salt on walking paths during winter. Clean up any spills in your garage right away. This includes oil or grease spills. What can I do in the bathroom? Use night lights. Install grab bars by the toilet and in the tub and shower. Do not use towel bars as grab bars. Use non-skid mats or decals in the tub or shower. If you need to sit down in the shower, use a plastic, non-slip stool. Keep the floor dry. Clean up any water that spills on the floor as soon as it happens. Remove soap buildup in the tub or shower regularly. Attach bath mats securely with double-sided non-slip rug tape. Do not have throw  rugs and other things on the floor that can make you trip. What can I do in the bedroom? Use night lights. Make sure that you have a light by your bed that is easy to reach. Do not use any sheets or blankets that are too big for your bed. They should not hang down onto the floor. Have a firm chair that has side arms. You can use this for support while you get dressed. Do not have throw rugs and other things on the floor that can make you trip. What can I do in the kitchen? Clean up any spills right away. Avoid walking on wet floors. Keep items that you use a lot in easy-to-reach places. If you need to reach something above you, use a strong step stool that has a grab bar. Keep electrical cords out of the way. Do not use floor polish or wax that makes floors slippery. If you must use wax, use non-skid floor wax. Do not have throw rugs and other things on the floor that can make you trip. What can I do with my stairs? Do not leave any items on the stairs. Make sure that there are handrails on both sides of the stairs and use them. Fix handrails that are broken or loose. Make sure that handrails are as long as the stairways. Check any carpeting to make sure that it is firmly attached to the stairs. Fix any carpet that is loose or worn. Avoid having throw  rugs at the top or bottom of the stairs. If you do have throw rugs, attach them to the floor with carpet tape. Make sure that you have a light switch at the top of the stairs and the bottom of the stairs. If you do not have them, ask someone to add them for you. What else can I do to help prevent falls? Wear shoes that: Do not have high heels. Have rubber bottoms. Are comfortable and fit you well. Are closed at the toe. Do not wear sandals. If you use a stepladder: Make sure that it is fully opened. Do not climb a closed stepladder. Make sure that both sides of the stepladder are locked into place. Ask someone to hold it for you, if  possible. Clearly mark and make sure that you can see: Any grab bars or handrails. First and last steps. Where the edge of each step is. Use tools that help you move around (mobility aids) if they are needed. These include: Canes. Walkers. Scooters. Crutches. Turn on the lights when you go into a dark area. Replace any light bulbs as soon as they burn out. Set up your furniture so you have a clear path. Avoid moving your furniture around. If any of your floors are uneven, fix them. If there are any pets around you, be aware of where they are. Review your medicines with your doctor. Some medicines can make you feel dizzy. This can increase your chance of falling. Ask your doctor what other things that you can do to help prevent falls. This information is not intended to replace advice given to you by your health care provider. Make sure you discuss any questions you have with your health care provider. Document Released: 10/02/2009 Document Revised: 05/13/2016 Document Reviewed: 01/10/2015 Elsevier Interactive Patient Education  2017 [employer].

## 2021-09-02 NOTE — Progress Notes (Signed)
This visit occurred during the SARS-CoV-2 public health emergency.  Safety protocols were in place, including screening questions prior to the visit, additional usage of staff PPE, and extensive cleaning of exam room while observing appropriate contact time as indicated for disinfecting solutions.   Subjective:   Dave Mannes is a 70 y.o. female who presents for Medicare Annual (Subsequent) preventive examination.  Review of Systems     Cardiac Risk Factors include: advanced age (>49mn, >>73women);diabetes mellitus;dyslipidemia;obesity (BMI >30kg/m2);sedentary lifestyle     Objective:    Today's Vitals   09/02/21 0956 09/02/21 1001 09/02/21 1022  BP: 138/78  138/78  Pulse: 89    Temp: 98 F (36.7 C)    TempSrc: Oral    SpO2: 99%    Weight: 199 lb 3.2 oz (90.4 kg)    Height: 5' 2.6" (1.59 m)    PainSc:  5     Body mass index is 35.74 kg/m.  Advanced Directives 09/02/2021 05/02/2021 10/15/2020 10/17/2019 10/11/2018 07/05/2017 07/17/2015  Does Patient Have a Medical Advance Directive? Yes No Yes Yes No No No  Type of AParamedicof [place] will - [place] - - -  Copy of [place] Chart? No - copy requested - No - copy requested No - copy requested - - -  Would patient like information on creating a medical advance directive? - Yes (ED - Information included in AVS) - - Yes (MAU/Ambulatory/Procedural Areas - Information given) - No - patient declined information    Current Medications (verified) Outpatient Encounter Medications as of 09/02/2021  Medication Sig   acetaminophen (TYLENOL) 500 MG tablet Take 1,000 mg by mouth at bedtime.   Blood Glucose Monitoring Suppl (ACURA BLOOD GLUCOSE METER) w/Device KIT Check blood sugars twice daily. Please provide patient with meter and supplies that is covered by pt ins.   Blood Glucose Monitoring Suppl (ONE  TOUCH ULTRA MINI) w/Device KIT Use to check blood sugars 3 times a day. Dx code:e11.65   cholecalciferol (VITAMIN D) 1000 units tablet Take 2,000 Units by mouth daily.   cyclobenzaprine (FLEXERIL) 10 MG tablet Take 1 tablet (10 mg total) by mouth 2 (two) times daily as needed for muscle spasms.   diclofenac Sodium (VOLTAREN) 1 % GEL Apply 4 g topically 4 (four) times daily.   docusate sodium (COLACE) 100 MG capsule Take 100 mg by mouth daily.   hydrALAZINE (APRESOLINE) 100 MG tablet TAKE 1 TABLET BY MOUTH  TWICE DAILY   hydrochlorothiazide (HYDRODIURIL) 25 MG tablet Take 1 tablet by mouth daily. Patient needs office visit for future refills   HYDROcodone bit-homatropine (HYDROMET) 5-1.5 MG/5ML syrup Take 5 mLs by mouth every 6 (six) hours as needed for cough.   latanoprost (XALATAN) 0.005 % ophthalmic solution Place 1 drop into both eyes at bedtime.   levothyroxine (SYNTHROID) 100 MCG tablet TAKE 1 TABLET BY MOUTH  DAILY BEFORE BREAKFAST   MAGNESIUM PO Take 375 mg by mouth daily.   melatonin 5 MG TABS Take 5 mg by mouth at bedtime.   metFORMIN (GLUCOPHAGE-XR) 500 MG 24 hr tablet TAKE 1 TABLET BY MOUTH TWICE A DAY   ONETOUCH ULTRA test strip USE WITH METER TO CHECK  BLOOD SUGAR TWICE DAILY  BEFORE BREAKFAST AND BEFORE DINNER   rosuvastatin (CRESTOR) 5 MG tablet TAKE 1 TABLET BY MOUTH EVERY DAY (MONDAY, WEDNESAY, FRIDAY)   spironolactone (ALDACTONE) 25 MG tablet Take 1 tablet (25 mg  total) by mouth daily. Patient needs office visit for future refills   timolol (BETIMOL) 0.25 % ophthalmic solution Place 1 drop into both eyes daily.   Turmeric 500 MG CAPS Take 1 tablet by mouth as needed (Takes occassionally).   methylPREDNISolone (MEDROL DOSEPAK) 4 MG TBPK tablet Take as directed on box (Patient not taking: Reported on 09/02/2021)   rosuvastatin (CRESTOR) 5 MG tablet TAKE 1 TABLET BY MOUTH  DAILY ON MONDAY, WEDNESDAY, AND FRIDAY (Patient not taking: Reported on 09/02/2021)   No facility-administered  encounter medications on file as of 09/02/2021.    Allergies (verified) Amlodipine, Aspartame and phenylalanine, Bidil [isosorb dinitrate-hydralazine], Erythromycin, Fentanyl, Ibuprofen, Phenylalanine, Prilosec [omeprazole], Tramadol, Valsartan, and Zantac [ranitidine hcl]   History: Past Medical History:  Diagnosis Date   Back pain    Death of child 87   Due to cord strangulation.   Diabetes mellitus without complication (HCC)    Hyperlipidemia    Hypertension    Hypothyroidism    Joint pain    Osteoarthritis    Other fatigue    Shortness of breath on exertion    Thyroid condition    Past Surgical History:  Procedure Laterality Date   APPENDECTOMY  1974   CARDIAC CATHETERIZATION     two, dates not provided.   CHOLECYSTECTOMY     confirm date with patient, was it 57?   LAPAROSCOPIC TUBAL LIGATION  1987   THYROIDECTOMY     Family History  Problem Relation Age of Onset   COPD Mother    Diabetes Mother    Hypertension Mother    Obesity Mother    Heart disease Father    Hypertension Father    Glaucoma Father    Social History   Socioeconomic History   Marital status: Married    Spouse name: Breunna Nordmann   Number of children: Not on file   Years of education: Not on file   Highest education level: Not on file  Occupational History   Occupation: retired    Fish farm manager: [employer]  Tobacco Use   Smoking status: Never   Smokeless tobacco: Never  Vaping Use   Vaping Use: Never used  Substance and Sexual Activity   Alcohol use: Not Currently   Drug use: No   Sexual activity: Not Currently  Other Topics Concern   Not on file  Social History Narrative   Not on file   Social Determinants of Health   Financial Resource Strain: Low Risk    Difficulty of Paying Living Expenses: Not hard at all  Food Insecurity: No Food Insecurity   Worried About Charity fundraiser in the Last Year: Never true   [place] in the Last Year: Never true  Transportation  Needs: No Transportation Needs   Lack of Transportation (Medical): No   Lack of Transportation (Non-Medical): No  Physical Activity: Inactive   Days of Exercise per Week: 0 days   Minutes of Exercise per Session: 0 min  Stress: No Stress Concern Present   Feeling of Stress : Only a little  Social Connections: Not on file    Tobacco Counseling Counseling given: Not Answered   Clinical Intake:  Pre-visit preparation completed: Yes  Pain : 0-10 Pain Score: 5  Pain Type: Chronic pain Pain Location: Knee Pain Orientation: Left Pain Descriptors / Indicators: Aching Pain Onset: More than a month ago Pain Frequency: Constant     Nutritional Status: BMI > 30  Obese Nutritional Risks: None Diabetes: Yes  How often do you need to have someone help you when you read instructions, pamphlets, or other written materials from your doctor or pharmacy?: 1 - Never What is the last grade level you completed in school?: BS nursing  Diabetic? Yes Nutrition Risk Assessment:  Has the patient had any N/V/D within the last 2 months?  No  Does the patient have any non-healing wounds?  No  Has the patient had any unintentional weight loss or weight gain?  No   Diabetes:  Is the patient diabetic?  Yes  If diabetic, was a CBG obtained today?  No  Did the patient bring in their glucometer from home?  No  How often do you monitor your CBG's? daily.   Financial Strains and Diabetes Management:  Are you having any financial strains with the device, your supplies or your medication? No .  Does the patient want to be seen by Chronic Care Management for management of their diabetes?  No  Would the patient like to be referred to a Nutritionist or for Diabetic Management?  No   Diabetic Exams:  Diabetic Eye Exam: Completed 10/22/2020 Diabetic Foot Exam: Completed 10/21/2020   Interpreter Needed?: No  Information entered by :: NAllen LPN   Activities of Daily Living In your present state  of health, do you have any difficulty performing the following activities: 09/02/2021 10/15/2020  Hearing? N N  Vision? N N  Difficulty concentrating or making decisions? N N  Walking or climbing stairs? Y Y  Comment - due to knees  Dressing or bathing? N N  Doing errands, shopping? N N  Preparing Food and eating ? N N  Using the Toilet? N N  In the past six months, have you accidently leaked urine? Y Y  Comment all the time all the time  Do you have problems with loss of bowel control? N N  Managing your Medications? N N  Managing your Finances? N N  Housekeeping or managing your Housekeeping? N N  Some recent data might be hidden    Patient Care Team: Minette Brine, FNP as PCP - General (General Practice)  Indicate any recent Medical Services you may have received from other than [facility] providers in the past year (date may be approximate).     Assessment:   This is a routine wellness examination for Kandee.  Hearing/Vision screen Vision Screening - Comments:: Regular eye exams, Dr. Katy Fitch  Dietary issues and exercise activities discussed: Current Exercise Habits: The patient does not participate in regular exercise at present   Goals Addressed             This Visit's Progress    Patient Stated       09/02/2021, wants to weigh 180 pounds       Depression Screen PHQ 2/9 Scores 09/02/2021 01/27/2021 10/15/2020 01/17/2020 10/17/2019 07/18/2019 04/30/2019  PHQ - 2 Score 0 2 0 0 0 0 0  PHQ- 9 Score - 9 - - 2 - -    Fall Risk Fall Risk  09/02/2021 10/15/2020 01/17/2020 10/17/2019 07/18/2019  Falls in the past year? 0 0 0 0 0  Number falls in past yr: - - - 0 -  Risk for fall due to : Impaired mobility;Medication side effect Impaired balance/gait;Medication side effect - Medication side effect -  Follow up Falls evaluation completed;Education provided;Falls prevention discussed Falls evaluation completed;Education provided;Falls prevention discussed - Falls evaluation  completed;Education provided;Falls prevention discussed -    FALL RISK PREVENTION PERTAINING TO THE  HOME:  Any stairs in or around the home? No  If so, are there any without handrails?  N/a Home free of loose throw rugs in walkways, pet beds, electrical cords, etc? Yes  Adequate lighting in your home to reduce risk of falls? Yes   ASSISTIVE DEVICES UTILIZED TO PREVENT FALLS:  Life alert? No  Use of a cane, walker or w/c? No  Grab bars in the bathroom? Yes  Shower chair or bench in shower? No  Elevated toilet seat or a handicapped toilet? No   TIMED UP AND GO:  Was the test performed? No .     Gait slow and steady without use of assistive device  Cognitive Function:     6CIT Screen 09/02/2021 10/15/2020 10/17/2019 10/11/2018  What Year? 0 points 0 points 0 points 0 points  What month? 0 points 0 points 0 points 0 points  What time? 0 points 0 points 0 points 0 points  Count back from 20 0 points 0 points 0 points 0 points  Months in reverse 0 points 0 points 0 points 0 points  Repeat phrase 6 points 0 points 2 points 0 points  Total Score 6 0 2 0    Immunizations Immunization History  Administered Date(s) Administered   Influenza, High Dose Seasonal PF 10/11/2018   Influenza-Unspecified 10/12/2020   PFIZER(Purple Top)SARS-COV-2 Vaccination 12/22/2019, 01/09/2020, 09/16/2020   Pneumococcal Conjugate-13 10/17/2018   Tdap 10/19/2019    TDAP status: Up to date  Flu Vaccine status: Due, Education has been provided regarding the importance of this vaccine. Advised may receive this vaccine at local pharmacy or Health Dept. Aware to provide a copy of the vaccination record if obtained from local pharmacy or Health Dept. Verbalized acceptance and understanding.  Pneumococcal vaccine status: Up to date  Covid-19 vaccine status: Completed vaccines  Qualifies for Shingles Vaccine? Yes   Zostavax completed No   Shingrix Completed?: No.    Education has been provided  regarding the importance of this vaccine. Patient has been advised to call insurance company to determine out of pocket expense if they have not yet received this vaccine. Advised may also receive vaccine at local pharmacy or Health Dept. Verbalized acceptance and understanding.  Screening Tests Health Maintenance  Topic Date Due   Zoster Vaccines- Shingrix (1 of 2) Never done   COLONOSCOPY (Pts 45-36yr Insurance coverage will need to be confirmed)  03/27/2020   COVID-19 Vaccine (4 - Booster for Pfizer series) 01/16/2021   INFLUENZA VACCINE  07/20/2021   COLON CANCER SCREENING ANNUAL FOBT  07/30/2021   FOOT EXAM  10/21/2021   OPHTHALMOLOGY EXAM  10/22/2021   HEMOGLOBIN A1C  01/02/2022   MAMMOGRAM  12/11/2022   TETANUS/TDAP  10/18/2029   DEXA SCAN  Completed   Hepatitis C Screening  Completed   PNA vac Low Risk Adult  Completed   HPV VACCINES  Aged Out    Health Maintenance  Health Maintenance Due  Topic Date Due   Zoster Vaccines- Shingrix (1 of 2) Never done   COLONOSCOPY (Pts 45-461yrInsurance coverage will need to be confirmed)  03/27/2020   COVID-19 Vaccine (4 - Booster for PfWeogufkaeries) 01/16/2021   INFLUENZA VACCINE  07/20/2021   COLON CANCER SCREENING ANNUAL FOBT  07/30/2021    Colorectal cancer screening: decline  Mammogram status: Completed 12/11/2020. Repeat every year  Bone Density status: Completed 12/10/2020.  Lung Cancer Screening: (Low Dose CT Chest recommended if Age 69-80ears, 30 pack-year currently smoking OR have quit  w/in 15years.) does not qualify.   Lung Cancer Screening Referral: no  Additional Screening:  Hepatitis C Screening: does qualify; Completed 10/11/2018  Vision Screening: Recommended annual ophthalmology exams for early detection of glaucoma and other disorders of the eye. Is the patient up to date with their annual eye exam?  Yes  Who is the provider or what is the name of the office in which the patient attends annual eye exams?  Dr. Katy Fitch If pt is not established with a provider, would they like to be referred to a provider to establish care? No .   Dental Screening: Recommended annual dental exams for proper oral hygiene  Community Resource Referral / Chronic Care Management: CRR required this visit?  No   CCM required this visit?  No      Plan:     I have personally reviewed and noted the following in the patient's chart:   Medical and social history Use of alcohol, tobacco or illicit drugs  Current medications and supplements including opioid prescriptions.  Functional ability and status Nutritional status Physical activity Advanced directives List of other physicians Hospitalizations, surgeries, and ER visits in previous 12 months Vitals Screenings to include cognitive, depression, and falls Referrals and appointments  In addition, I have reviewed and discussed with patient certain preventive protocols, quality metrics, and best practice recommendations. A written personalized care plan for preventive services as well as general preventive health recommendations were provided to patient.     Kellie Simmering, LPN   8/41/2820   Nurse Notes:

## 2021-09-07 ENCOUNTER — Encounter (INDEPENDENT_AMBULATORY_CARE_PROVIDER_SITE_OTHER): Payer: Self-pay | Admitting: Physician Assistant

## 2021-09-07 ENCOUNTER — Ambulatory Visit (INDEPENDENT_AMBULATORY_CARE_PROVIDER_SITE_OTHER): Payer: Medicare Other | Admitting: Physician Assistant

## 2021-09-07 ENCOUNTER — Other Ambulatory Visit: Payer: Self-pay

## 2021-09-07 VITALS — BP 138/78 | HR 72 | Temp 97.9°F | Ht 64.0 in | Wt 191.0 lb

## 2021-09-07 DIAGNOSIS — E119 Type 2 diabetes mellitus without complications: Secondary | ICD-10-CM

## 2021-09-07 DIAGNOSIS — Z6835 Body mass index (BMI) 35.0-35.9, adult: Secondary | ICD-10-CM

## 2021-09-07 NOTE — Progress Notes (Signed)
Chief Complaint:   OBESITY Deborah [name] is here to discuss her progress with her obesity treatment plan along with follow-up of her obesity related diagnoses. Deborah [name] is on the Category 2 Plan and keeping [name] food journal and adhering to recommended goals of 1100-1300 calories and 75 grams of protein and states she is following her eating plan approximately 80% of the time. Deborah [name] states she is doing 0 minutes 0 times per week.  Today's visit was #: 11 Starting weight: 204 lbs Starting date: 02/06/2021 Today's weight: 191 lbs Today's date: 09/07/2021 Total lbs lost to date: 13 lbs Total lbs lost since last in-office visit: 0  Interim History: Deborah [name] reports that groceries are expensive, especially when eating healthier foods. 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  Subjective:   1. Type 2 diabetes mellitus without complication, without long-term current use of insulin (HCC) Deborah [name]'s fasting blood sugar was 100-110. She is on Metformin twice daily and tolerating it well. Her last A1C level was 5.9.   Assessment/Plan:   1. Type 2 diabetes mellitus without complication, without long-term current use of insulin (HCC) Deborah [name] will continue medications and weight loss. Good blood sugar control is important to decrease the likelihood of diabetic complications such as nephropathy, neuropathy, limb loss, blindness, coronary artery disease, and death. Intensive lifestyle modification including diet, exercise and weight loss are the first line of treatment for diabetes.    2. Obesity with current BMI 32.77 Deborah [name] is currently in the action stage of change. As such, her goal is to continue with weight loss efforts. She has agreed to the Category 2 Plan with breakfast and lunch options.  Exercise goals: No exercise has been prescribed at this time.  Behavioral modification strategies: meal planning and cooking  strategies and planning for success.  Deborah [name] has agreed to follow-up with our clinic in 3 weeks. She was informed of the importance of frequent follow-up visits to maximize her success with intensive lifestyle modifications for her multiple health conditions.   Objective:   Blood pressure 138/78, pulse 72, temperature 97.9 F (36.6 C), height '5\' 4"'$  (1.626 m), weight 191 lb (86.6 kg), SpO2 96 %. Body mass index is 32.79 kg/m.  General: Cooperative, alert, well developed, in no acute distress. HEENT: Conjunctivae and lids unremarkable. Cardiovascular: Regular rhythm.  Lungs: Normal work of breathing. Neurologic: No focal deficits.   Lab Results  Component Value Date   CREATININE 0.65 07/02/2021   BUN 15 07/02/2021   NA 134 07/02/2021   K 4.3 07/02/2021   CL 95 (L) 07/02/2021   CO2 24 07/02/2021   Lab Results  Component Value Date   ALT 15 07/02/2021   AST 14 07/02/2021   ALKPHOS 93 07/02/2021   BILITOT 0.3 07/02/2021   Lab Results  Component Value Date   HGBA1C 5.9 (H) 07/02/2021   HGBA1C 5.6 02/18/2021   HGBA1C 6.1 (H) 01/27/2021   HGBA1C 6.3 (H) 10/21/2020   HGBA1C 6.2 (H) 08/18/2020   Lab Results  Component Value Date   INSULIN 31.4 (H) 01/27/2021   Lab Results  Component Value Date   TSH 4.400 01/27/2021   Lab Results  Component Value Date   CHOL 168 02/18/2021   HDL 49 02/18/2021   LDLCALC 105 (H) 02/18/2021   TRIG 76 02/18/2021   CHOLHDL 3.4 02/18/2021   Lab Results  Component Value Date   VD25OH 61.9 07/02/2021  VD25OH 55.1 02/18/2021   VD25OH 50.5 01/27/2021   Lab Results  Component Value Date   WBC 5.8 05/02/2021   HGB 11.5 (L) 05/02/2021   HCT 36.6 05/02/2021   MCV 90.6 05/02/2021   PLT 399 05/02/2021   No results found for: IRON, TIBC, FERRITIN  Obesity Behavioral Intervention:   Approximately 15 minutes were spent on the discussion below.  ASK: We discussed the diagnosis of obesity with Deborah [name] today and Deborah [name] agreed to give  Deborah [name] permission to discuss obesity behavioral modification therapy today.  ASSESS: Deborah [name] has the diagnosis of obesity and her BMI today is 32.8. Deborah [name] is in the action stage of change.   ADVISE: Deborah [name] was educated on the multiple health risks of obesity as well as the benefit of weight loss to improve her health. She was advised of the need for long term treatment and the importance of lifestyle modifications to improve her current health and to decrease her risk of future health problems.  AGREE: Multiple dietary modification options and treatment options were discussed and Deborah [name] agreed to follow the recommendations documented in the above note.  ARRANGE: Deborah [name] was educated on the importance of frequent visits to treat obesity as outlined per CMS and USPSTF guidelines and agreed to schedule her next follow up appointment today.  Attestation Statements:   Reviewed by clinician on day of visit: allergies, medications, problem list, medical history, surgical history, family history, social history, and previous encounter notes.  I, Tonye Pearson, am acting as Location manager for [employer], PA-C.   I have reviewed the above documentation for accuracy and completeness, and I agree with the above. Abby Potash, PA-C

## 2021-09-08 DIAGNOSIS — Z9889 Other specified postprocedural states: Secondary | ICD-10-CM | POA: Diagnosis not present

## 2021-09-08 DIAGNOSIS — H401131 Primary open-angle glaucoma, bilateral, mild stage: Secondary | ICD-10-CM | POA: Diagnosis not present

## 2021-09-08 DIAGNOSIS — E119 Type 2 diabetes mellitus without complications: Secondary | ICD-10-CM | POA: Diagnosis not present

## 2021-09-08 DIAGNOSIS — H43813 Vitreous degeneration, bilateral: Secondary | ICD-10-CM | POA: Diagnosis not present

## 2021-09-08 DIAGNOSIS — H53423 Scotoma of blind spot area, bilateral: Secondary | ICD-10-CM | POA: Diagnosis not present

## 2021-09-08 DIAGNOSIS — H2513 Age-related nuclear cataract, bilateral: Secondary | ICD-10-CM | POA: Diagnosis not present

## 2021-09-21 ENCOUNTER — Other Ambulatory Visit (INDEPENDENT_AMBULATORY_CARE_PROVIDER_SITE_OTHER): Payer: Self-pay | Admitting: Family Medicine

## 2021-09-21 DIAGNOSIS — E119 Type 2 diabetes mellitus without complications: Secondary | ICD-10-CM

## 2021-09-22 ENCOUNTER — Encounter: Payer: Self-pay | Admitting: Nurse Practitioner

## 2021-09-24 ENCOUNTER — Encounter: Payer: Self-pay | Admitting: Nurse Practitioner

## 2021-09-25 ENCOUNTER — Other Ambulatory Visit: Payer: Self-pay | Admitting: Nurse Practitioner

## 2021-09-25 DIAGNOSIS — R053 Chronic cough: Secondary | ICD-10-CM

## 2021-09-25 MED ORDER — HYDROCODONE BIT-HOMATROP MBR 5-1.5 MG/5ML PO SOLN: 5.0000 mL | Freq: Four times a day (QID) | ORAL | 0 refills | Status: DC | PRN

## 2021-09-28 ENCOUNTER — Encounter (INDEPENDENT_AMBULATORY_CARE_PROVIDER_SITE_OTHER): Payer: Self-pay | Admitting: Family Medicine

## 2021-09-28 ENCOUNTER — Ambulatory Visit (INDEPENDENT_AMBULATORY_CARE_PROVIDER_SITE_OTHER): Payer: Medicare Other | Admitting: Family Medicine

## 2021-09-28 ENCOUNTER — Other Ambulatory Visit: Payer: Self-pay

## 2021-09-28 VITALS — BP 150/78 | HR 89 | Temp 97.8°F | Ht 64.0 in | Wt 192.0 lb

## 2021-09-28 DIAGNOSIS — Z6835 Body mass index (BMI) 35.0-35.9, adult: Secondary | ICD-10-CM | POA: Diagnosis not present

## 2021-09-28 DIAGNOSIS — E119 Type 2 diabetes mellitus without complications: Secondary | ICD-10-CM | POA: Diagnosis not present

## 2021-09-28 DIAGNOSIS — F3289 Other specified depressive episodes: Secondary | ICD-10-CM

## 2021-09-28 MED ORDER — METFORMIN HCL ER 500 MG PO TB24: 500.0000 mg | ORAL_TABLET | Freq: Two times a day (BID) | ORAL | 0 refills | Status: DC

## 2021-09-28 NOTE — Progress Notes (Signed)
Chief Complaint:   OBESITY Deborah [name] is here to discuss her progress with her obesity treatment plan along with follow-up of her obesity related diagnoses. [name] is on the Category 2 Plan with breakfast and lunch options and states she is following her eating plan approximately 85% of the time. [name] states she is doing 0 minutes 0 times per week.  Today's visit was #: 12 Starting weight: 204 lbs Starting date: 02/06/2021 Today's weight: 192 lbs Today's date: 09/28/2021 Total lbs lost to date: 12 lbs Total lbs lost since last in-office visit: 0  Interim History: [name] feels she is getting in adequate protein.She denies excessive hunger. She has eggs for breakfast. Her lunch is soup  which she knows in not really on plan. She generally has baked pork chops or chicken for dinner. She has sweet tea about once per week. She says she has lost motivation and has quite a bit of stress. She would like to take a break from the program. Subjective:   1. Type 2 diabetes mellitus without complication, without long-term current use of insulin (Deborah [name]) Ellieana's diabetes is well controlled. Her last A1C was 5.9. Her fasting CBGs was 100-115. She is on Metformin.   Lab Results  Component Value Date   HGBA1C 5.9 (H) 07/02/2021   HGBA1C 5.6 02/18/2021   HGBA1C 6.1 (H) 01/27/2021   Lab Results  Component Value Date   MICROALBUR 10 10/21/2020   LDLCALC 105 (H) 02/18/2021   CREATININE 0.65 07/02/2021   Lab Results  Component Value Date   INSULIN 31.4 (H) 01/27/2021    2. Other depression  Deborah [name] admits to depression. She says she has been depressed for many years. 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 He is non compliant with some of his treatments. Deborah [name] is exhausted from care giving.  Assessment/Plan:   1. Type 2 diabetes mellitus without complication, without long-term current  use of insulin (HCC) We will refill Metformin 500 mg for 2 months with no refills. - metFORMIN (GLUCOPHAGE-XR) 500 MG 24 hr tablet; Take 1 tablet (500 mg total) by mouth 2 (two) times daily.  Dispense: 120 tablet; Refill: 0  2. Other depression  [name] was encouraged to discuss depression with her primary care provider at her appointment in November. She was also encouraged to consider medication and counseling..    3. Obesity with current BMI 32.94 Deborah [name] is currently in the action stage of change. As such, her goal is to continue with weight loss efforts. She has agreed to practicing portion control and making smarter food choices, such as increasing vegetables and decreasing simple carbohydrates.     Exercise goals: No exercise has been prescribed at this time.  Behavioral modification strategies: increasing lean protein intake and meal planning and cooking strategies. I encouraged her to weight weekly to bi-weekly and work on maintaining weight at 192 lbs.  Deborah [name] has agreed to follow-up with our clinic in 8-10 weeks per the patient's request.  Objective:   Blood pressure (!) 150/78, pulse 89, temperature 97.8 F (36.6 C), height 5\' 4"  (1.626 m), weight 192 lb (87.1 kg), SpO2 98 %. Body mass index is 32.96 kg/m.  General: Cooperative, alert, well developed, in no acute distress. HEENT: Conjunctivae and lids unremarkable. Cardiovascular: Regular rhythm.  Lungs: Normal work of breathing. Neurologic: No focal deficits.   Lab Results  Component Value Date   CREATININE 0.65  07/02/2021   BUN 15 07/02/2021   NA 134 07/02/2021   K 4.3 07/02/2021   CL 95 (L) 07/02/2021   CO2 24 07/02/2021   Lab Results  Component Value Date   ALT 15 07/02/2021   AST 14 07/02/2021   ALKPHOS 93 07/02/2021   BILITOT 0.3 07/02/2021   Lab Results  Component Value Date   HGBA1C 5.9 (H) 07/02/2021   HGBA1C 5.6 02/18/2021   HGBA1C 6.1 (H) 01/27/2021   HGBA1C 6.3 (H) 10/21/2020   HGBA1C 6.2  (H) 08/18/2020   Lab Results  Component Value Date   INSULIN 31.4 (H) 01/27/2021   Lab Results  Component Value Date   TSH 4.400 01/27/2021   Lab Results  Component Value Date   CHOL 168 02/18/2021   HDL 49 02/18/2021   LDLCALC 105 (H) 02/18/2021   TRIG 76 02/18/2021   CHOLHDL 3.4 02/18/2021   Lab Results  Component Value Date   VD25OH 61.9 07/02/2021   VD25OH 55.1 02/18/2021   VD25OH 50.5 01/27/2021   Lab Results  Component Value Date   WBC 5.8 05/02/2021   HGB 11.5 (L) 05/02/2021   HCT 36.6 05/02/2021   MCV 90.6 05/02/2021   PLT 399 05/02/2021   No results found for: IRON, TIBC, FERRITIN  Attestation Statements:   Reviewed by clinician on day of visit: allergies, medications, problem list, medical history, surgical history, family history, social history, and previous encounter notes.   I, Lizbeth Bark, RMA, am acting as Location manager for [employer], [place].   I have reviewed the above documentation for accuracy and completeness, and I agree with the above. -  Georgianne Fick, FNP

## 2021-10-15 ENCOUNTER — Other Ambulatory Visit: Payer: Self-pay | Admitting: Cardiovascular Disease

## 2021-10-15 DIAGNOSIS — I10 Essential (primary) hypertension: Secondary | ICD-10-CM

## 2021-10-20 ENCOUNTER — Encounter (INDEPENDENT_AMBULATORY_CARE_PROVIDER_SITE_OTHER): Payer: Self-pay

## 2021-10-28 ENCOUNTER — Other Ambulatory Visit: Payer: Self-pay

## 2021-10-28 ENCOUNTER — Ambulatory Visit (INDEPENDENT_AMBULATORY_CARE_PROVIDER_SITE_OTHER): Payer: Medicare Other | Admitting: Nurse Practitioner

## 2021-10-28 ENCOUNTER — Ambulatory Visit: Payer: Medicare Other

## 2021-10-28 ENCOUNTER — Encounter: Payer: Self-pay | Admitting: Nurse Practitioner

## 2021-10-28 ENCOUNTER — Ambulatory Visit
Admission: RE | Admit: 2021-10-28 | Discharge: 2021-10-28 | Disposition: A | Discharge status: Home / Self Care | Payer: Medicare Other | Source: Ambulatory Visit | Attending: Nurse Practitioner | Admitting: Nurse Practitioner

## 2021-10-28 VITALS — BP 134/88 | HR 98 | Temp 98.1°F | Ht 64.0 in | Wt 196.4 lb

## 2021-10-28 DIAGNOSIS — E1169 Type 2 diabetes mellitus with other specified complication: Secondary | ICD-10-CM

## 2021-10-28 DIAGNOSIS — Z23 Encounter for immunization: Secondary | ICD-10-CM | POA: Diagnosis not present

## 2021-10-28 DIAGNOSIS — I700 Atherosclerosis of aorta: Secondary | ICD-10-CM

## 2021-10-28 DIAGNOSIS — E039 Hypothyroidism, unspecified: Secondary | ICD-10-CM

## 2021-10-28 DIAGNOSIS — R053 Chronic cough: Secondary | ICD-10-CM | POA: Diagnosis not present

## 2021-10-28 DIAGNOSIS — R0789 Other chest pain: Secondary | ICD-10-CM | POA: Diagnosis not present

## 2021-10-28 DIAGNOSIS — R7309 Other abnormal glucose: Secondary | ICD-10-CM | POA: Diagnosis not present

## 2021-10-28 DIAGNOSIS — E785 Hyperlipidemia, unspecified: Secondary | ICD-10-CM | POA: Diagnosis not present

## 2021-10-28 DIAGNOSIS — Z79899 Other long term (current) drug therapy: Secondary | ICD-10-CM | POA: Diagnosis not present

## 2021-10-28 DIAGNOSIS — R195 Other fecal abnormalities: Secondary | ICD-10-CM | POA: Diagnosis not present

## 2021-10-28 DIAGNOSIS — R0602 Shortness of breath: Secondary | ICD-10-CM | POA: Diagnosis not present

## 2021-10-28 DIAGNOSIS — I10 Essential (primary) hypertension: Secondary | ICD-10-CM

## 2021-10-28 DIAGNOSIS — Z0000 Encounter for general adult medical examination without abnormal findings: Secondary | ICD-10-CM | POA: Diagnosis not present

## 2021-10-28 DIAGNOSIS — R079 Chest pain, unspecified: Secondary | ICD-10-CM | POA: Diagnosis not present

## 2021-10-28 LAB — POCT URINALYSIS DIPSTICK
Bilirubin, UA: NEGATIVE
Blood, UA: NEGATIVE
Glucose, UA: NEGATIVE
Ketones, UA: NEGATIVE
Leukocytes, UA: NEGATIVE
Nitrite, UA: NEGATIVE
Protein, UA: NEGATIVE
Spec Grav, UA: 1.02 (ref 1.010–1.025)
Urobilinogen, UA: 0.2 E.U./dL
pH, UA: 7 (ref 5.0–8.0)

## 2021-10-28 LAB — POCT UA - MICROALBUMIN
Albumin/Creatinine Ratio, Urine, POC: <30
Creatinine, POC: 200 mg/dL
Microalbumin Ur, POC: 10 mg/L

## 2021-10-28 NOTE — Patient Instructions (Signed)

## 2021-10-28 NOTE — Progress Notes (Addendum)
I,Victoria T Hamilton,acting as a Education administrator for Minette Brine, FNP.,have documented all relevant documentation on the behalf of Minette Brine, FNP,as directed by  Minette Brine, FNP while in the presence of Minette Brine, [place].   This visit occurred during the SARS-CoV-2 public health emergency.  Safety protocols were in place, including screening questions prior to the visit, additional usage of staff PPE, and extensive cleaning of exam room while observing appropriate contact time as indicated for disinfecting solutions.  Subjective:     Patient ID: Deborah [name] , female    DOB: Mar 10, 1951 , 70 y.o.   MRN: [id number]   Chief Complaint  Patient presents with   Annual Exam     HPI  Pt here for HM.   Wt Readings from Last 3 Encounters: 10/28/21 : 196 lb 6.4 oz (89.1 kg) 09/28/21 : 192 lb (87.1 kg) 09/07/21 : 191 lb (86.6 kg)  Reports she has been having midsternal chest pain - she is unable to take tums, prilosec - makes her sick.    Diabetes She presents for her follow-up diabetic visit. Diabetes type: prediabetes. Her disease course has been stable. There are no hypoglycemic associated symptoms. Pertinent negatives for hypoglycemia include no dizziness. There are no diabetic associated symptoms. Pertinent negatives for diabetes include no polydipsia, no polyphagia and no polyuria. There are no hypoglycemic complications. Symptoms are stable. There are no diabetic complications. Risk factors for coronary artery disease include diabetes mellitus, obesity and sedentary lifestyle. She is compliant with treatment most of the time. Her weight is stable. She is following a generally healthy diet. When asked about meal planning, she reported none. She has had a previous visit with a dietitian. She rarely participates in exercise. There is no change in her home blood glucose trend. (Blood sugars are 100-115) An ACE inhibitor/angiotensin II receptor blocker is being taken. She does not see a  podiatrist.Eye exam is current (10/22/2020).    Past Medical History:  Diagnosis Date   Back pain    Death of child 60   Due to cord strangulation.   Diabetes mellitus without complication (HCC)    Hyperlipidemia    Hypertension    Hypothyroidism    Joint pain    Osteoarthritis    Other fatigue    Shortness of breath on exertion    Thyroid condition      Family History  Problem Relation Age of Onset   COPD Mother    Diabetes Mother    Hypertension Mother    Obesity Mother    Heart disease Father    Hypertension Father    Glaucoma Father      Current Outpatient Medications:    acetaminophen (TYLENOL) 500 MG tablet, Take 1,000 mg by mouth at bedtime., Disp: , Rfl:    Blood Glucose Monitoring Suppl (ACURA BLOOD GLUCOSE METER) w/Device KIT, Check blood sugars twice daily. Please provide patient with meter and supplies that is covered by pt ins., Disp: 1 kit, Rfl: 0   Blood Glucose Monitoring Suppl (ONE TOUCH ULTRA MINI) w/Device KIT, Use to check blood sugars 3 times a day. Dx code:e11.65, Disp: 1 kit, Rfl: 0   cholecalciferol (VITAMIN D) 1000 units tablet, Take 2,000 Units by mouth daily., Disp: , Rfl:    cyclobenzaprine (FLEXERIL) 10 MG tablet, Take 1 tablet (10 mg total) by mouth 2 (two) times daily as needed for muscle spasms., Disp: 20 tablet, Rfl: 0   diclofenac Sodium (VOLTAREN) 1 % GEL, Apply 4 g topically 4 (four)  times daily., Disp: 100 g, Rfl: 0   docusate sodium (COLACE) 100 MG capsule, Take 100 mg by mouth daily., Disp: , Rfl:    hydrALAZINE (APRESOLINE) 100 MG tablet, TAKE 1 TABLET BY MOUTH  TWICE DAILY, Disp: 180 tablet, Rfl: 3   hydrochlorothiazide (HYDRODIURIL) 25 MG tablet, TAKE 1 TABLET BY MOUTH  DAILY, Disp: 30 tablet, Rfl: 11   HYDROcodone bit-homatropine (HYDROMET) 5-1.5 MG/5ML syrup, Take 5 mLs by mouth every 6 (six) hours as needed for cough., Disp: 120 mL, Rfl: 0   latanoprost (XALATAN) 0.005 % ophthalmic solution, Place 1 drop into both eyes at  bedtime., Disp: , Rfl:    levothyroxine (SYNTHROID) 100 MCG tablet, TAKE 1 TABLET BY MOUTH  DAILY BEFORE BREAKFAST, Disp: 90 tablet, Rfl: 3   MAGNESIUM PO, Take 375 mg by mouth daily., Disp: , Rfl:    melatonin 5 MG TABS, Take 5 mg by mouth at bedtime., Disp: , Rfl:    metFORMIN (GLUCOPHAGE-XR) 500 MG 24 hr tablet, Take 1 tablet (500 mg total) by mouth 2 (two) times daily., Disp: 120 tablet, Rfl: 0   ONETOUCH ULTRA test strip, USE WITH METER TO CHECK  BLOOD SUGAR TWICE DAILY  BEFORE BREAKFAST AND BEFORE DINNER, Disp: 200 strip, Rfl: 3   rosuvastatin (CRESTOR) 5 MG tablet, TAKE 1 TABLET BY MOUTH EVERY DAY (MONDAY, WEDNESAY, FRIDAY), Disp: 90 tablet, Rfl: 1   spironolactone (ALDACTONE) 25 MG tablet, TAKE 1 TABLET BY MOUTH  DAILY, Disp: 30 tablet, Rfl: 11   timolol (BETIMOL) 0.25 % ophthalmic solution, Place 1 drop into both eyes daily., Disp: , Rfl:    Turmeric 500 MG CAPS, Take 1 tablet by mouth as needed (Takes occassionally)., Disp: , Rfl:    Allergies  Allergen Reactions   Amlodipine     headache   Aspartame And Phenylalanine Other (See Comments)    Causes migraines   Bidil [Isosorb Dinitrate-Hydralazine]     headache   Erythromycin     Ask patient to specify type/severity/reaction. Not indicated on medical history form dated 11/04/10.   Fentanyl     Causes panic attacks   Ibuprofen     Ask patient to specify type/severity/reaction. Not noted on medical history form dated 11/04/10.   Phenylalanine Other (See Comments)    Causes migraine   Prilosec [Omeprazole] Nausea And Vomiting   Tramadol Nausea And Vomiting   Valsartan Cough   Zantac [Ranitidine Hcl]       The patient states she is post menopausal status. Last LMP was No LMP recorded. Patient is postmenopausal.. Negative for Dysmenorrhea and Negative for Menorrhagia. Negative for: breast discharge, breast lump(s), breast pain and breast self exam. Associated symptoms include abnormal vaginal bleeding. Pertinent negatives  include abnormal bleeding (hematology), anxiety, decreased libido, depression, difficulty falling sleep, dyspareunia, history of infertility, nocturia, sexual dysfunction, sleep disturbances, urinary incontinence, urinary urgency, vaginal discharge and vaginal itching. Diet regular, she has continued with [facility], she is scheduled in December.  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   The patient's tobacco use is:  Social History   Tobacco Use  Smoking Status Never  Smokeless Tobacco Never   She has been exposed to passive smoke. The patient's alcohol use is:  Social History   Substance and Sexual Activity  Alcohol Use Not Currently    Review of Systems  Constitutional: Negative.   HENT: Negative.    Eyes: Negative.   Respiratory: Negative.  Cardiovascular: Negative.        Intermittent chest discomfort  Gastrointestinal: Negative.   Endocrine: Negative.  Negative for polydipsia, polyphagia and polyuria.  Genitourinary: Negative.   Musculoskeletal: Negative.   Skin: Negative.   Allergic/Immunologic: Negative.   Neurological: Negative.  Negative for dizziness.  Hematological: Negative.   Psychiatric/Behavioral: Negative.      Today's Vitals   10/28/21 0848  BP: 134/88  Pulse: 98  Temp: 98.1 F (36.7 C)  Weight: 196 lb 6.4 oz (89.1 kg)  Height: '5\' 4"'  (1.626 m)  PainSc: 5    Body mass index is 33.71 kg/m.  Wt Readings from Last 3 Encounters:  10/28/21 196 lb 6.4 oz (89.1 kg)  09/28/21 192 lb (87.1 kg)  09/07/21 191 lb (86.6 kg)    Objective:  Physical Exam Vitals reviewed.  Constitutional:      General: She is not in acute distress.    Appearance: Normal appearance. She is well-developed. She is obese.  HENT:     Head: Normocephalic and atraumatic.     Right Ear: Hearing, tympanic membrane, ear canal and external ear normal. There is no impacted cerumen.     Left Ear: Hearing, tympanic membrane,  ear canal and external ear normal. There is no impacted cerumen.     Nose:     Comments: Deferred - masked    Mouth/Throat:     Comments: Deferred - masked Eyes:     General: Lids are normal.     Extraocular Movements: Extraocular movements intact.     Conjunctiva/sclera: Conjunctivae normal.     Pupils: Pupils are equal, round, and reactive to light.     Funduscopic exam:    Right eye: No papilledema.        Left eye: No papilledema.  Neck:     Thyroid: No thyroid mass.     Vascular: No carotid bruit.  Cardiovascular:     Rate and Rhythm: Normal rate and regular rhythm.     Pulses: Normal pulses.     Heart sounds: Normal heart sounds. No murmur heard. Pulmonary:     Effort: Pulmonary effort is normal. No respiratory distress.     Breath sounds: Normal breath sounds. No wheezing.  Chest:     Chest wall: No mass.  Breasts:    Tanner Score is 5.     Right: Normal. No mass or tenderness.     Left: Normal. No mass or tenderness.  Abdominal:     General: Abdomen is flat. Bowel sounds are normal. There is no distension.     Palpations: Abdomen is soft.     Tenderness: There is no abdominal tenderness.  Musculoskeletal:        General: No swelling or tenderness. Normal range of motion.     Cervical back: Full passive range of motion without pain, normal range of motion and neck supple.     Right lower leg: No edema.     Left lower leg: No edema.  Lymphadenopathy:     Upper Body:     Right upper body: No supraclavicular, axillary or pectoral adenopathy.     Left upper body: No supraclavicular, axillary or pectoral adenopathy.  Skin:    General: Skin is warm and dry.     Capillary Refill: Capillary refill takes less than 2 seconds.  Neurological:     General: No focal deficit present.     Mental Status: She is alert and oriented to person, place, and time.  Cranial Nerves: No cranial nerve deficit.     Sensory: No sensory deficit.  Psychiatric:        Mood and Affect:  Mood normal.        Behavior: Behavior normal.        Thought Content: Thought content normal.        Judgment: Judgment normal.        Assessment And Plan:     1. Encounter for general adult medical examination w/o abnormal findings Behavior modifications discussed and diet history reviewed.   Pt will continue to exercise regularly and modify diet with low GI, plant based foods and decrease intake of processed foods.  Recommend intake of daily multivitamin, Vitamin D, and calcium.  Recommend mammogram and colonoscopy for preventive screenings, as well as recommend immunizations that include influenza, TDAP, and Shingles (reports this is too expensive) - CMP14+EGFR - CBC - Hemoglobin A1c - Lipid panel - POCT Urinalysis Dipstick (81002) - POCT UA - Microalbumin - EKG 12-Lead  2. Essential hypertension Comments: Blood pressure is fairly controlled, continue current medications and follow up a low salt diet  3. Type 2 diabetes mellitus with other specified complication, without long-term current use of insulin (HCC) Comments: Stable, tolerating medications well  4. Acquired hypothyroidism Comments: Well controlled, continue medications as directed and will make changes as necessary pending lab results  5. Atherosclerosis of aorta ([place]) Comments: Tolerating statin well  6. Encounter for immunization - Pneumococcal polysaccharide vaccine 23-valent greater than or equal to 2yo subcutaneous/IM  7. Chest discomfort Comments: EKG done with NSR HR 79, will also check cxr  She does have intermittent coughing. She is unable to take anti reflux medications due to side effects, encouraged to avoid trigger foods - DG Chest 2 View; Future  8. Positive colorectal cancer screening using Cologuard test Comments: She had a positive cologuard, will refer to GI for further evaluation - Ambulatory referral to Gastroenterology   Addendum to change current diet to generally healthy and yes to  dietician  Patient was given opportunity to ask questions. Patient verbalized understanding of the plan and was able to repeat key elements of the plan. All questions were answered to their satisfaction.   Minette Brine, FNP   I, Minette Brine, FNP, have reviewed all documentation for this visit. The documentation on 11/05/21 for the exam, diagnosis, procedures, and orders are all accurate and complete.  THE PATIENT IS ENCOURAGED TO PRACTICE SOCIAL DISTANCING DUE TO THE COVID-19 PANDEMIC.

## 2021-10-29 LAB — HEMOGLOBIN A1C
Est. average glucose Bld gHb Est-mCnc: 126 mg/dL
Hgb A1c MFr Bld: 6 % — ABNORMAL HIGH (ref 4.8–5.6)

## 2021-10-29 LAB — CMP14+EGFR
ALT: 19 IU/L (ref 0–32)
AST: 16 IU/L (ref 0–40)
Albumin/Globulin Ratio: 1.8 (ref 1.2–2.2)
Albumin: 4.6 g/dL (ref 3.8–4.8)
Alkaline Phosphatase: 86 IU/L (ref 44–121)
BUN/Creatinine Ratio: 29 — ABNORMAL HIGH (ref 12–28)
BUN: 19 mg/dL (ref 8–27)
Bilirubin Total: 0.5 mg/dL (ref 0.0–1.2)
CO2: 25 mmol/L (ref 20–29)
Calcium: 9.5 mg/dL (ref 8.7–10.3)
Chloride: 97 mmol/L (ref 96–106)
Creatinine, Ser: 0.66 mg/dL (ref 0.57–1.00)
Globulin, Total: 2.5 g/dL (ref 1.5–4.5)
Glucose: 91 mg/dL (ref 70–99)
Potassium: 4.5 mmol/L (ref 3.5–5.2)
Sodium: 137 mmol/L (ref 134–144)
Total Protein: 7.1 g/dL (ref 6.0–8.5)
eGFR: 95 mL/min/{1.73_m2} (ref >59)

## 2021-10-29 LAB — CBC
Hematocrit: 35.9 % (ref 34.0–46.6)
Hemoglobin: 11.6 g/dL (ref 11.1–15.9)
MCH: 28.2 pg (ref 26.6–33.0)
MCHC: 32.3 g/dL (ref 31.5–35.7)
MCV: 87 fL (ref 79–97)
Platelets: 418 10*3/uL (ref 150–450)
RBC: 4.11 x10E6/uL (ref 3.77–5.28)
RDW: 13.2 % (ref 11.7–15.4)
WBC: 5 10*3/uL (ref 3.4–10.8)

## 2021-10-29 LAB — LIPID PANEL
Chol/HDL Ratio: 3.2 ratio (ref 0.0–4.4)
Cholesterol, Total: 177 mg/dL (ref 100–199)
HDL: 55 mg/dL (ref >39)
LDL Chol Calc (NIH): 107 mg/dL — ABNORMAL HIGH (ref 0–99)
Triglycerides: 81 mg/dL (ref 0–149)
VLDL Cholesterol Cal: 15 mg/dL (ref 5–40)

## 2021-11-19 ENCOUNTER — Other Ambulatory Visit (INDEPENDENT_AMBULATORY_CARE_PROVIDER_SITE_OTHER): Payer: Self-pay | Admitting: Emergency Medicine

## 2021-11-19 ENCOUNTER — Other Ambulatory Visit (INDEPENDENT_AMBULATORY_CARE_PROVIDER_SITE_OTHER): Payer: Self-pay | Admitting: Family Medicine

## 2021-11-19 DIAGNOSIS — E119 Type 2 diabetes mellitus without complications: Secondary | ICD-10-CM

## 2021-11-19 NOTE — Telephone Encounter (Signed)
Will refill at patient 11/23/21 appt

## 2021-11-23 ENCOUNTER — Other Ambulatory Visit: Payer: Self-pay

## 2021-11-23 ENCOUNTER — Encounter (INDEPENDENT_AMBULATORY_CARE_PROVIDER_SITE_OTHER): Payer: Self-pay | Admitting: Family Medicine

## 2021-11-23 ENCOUNTER — Ambulatory Visit (INDEPENDENT_AMBULATORY_CARE_PROVIDER_SITE_OTHER): Payer: Medicare Other | Admitting: Family Medicine

## 2021-11-23 VITALS — BP 111/73 | HR 69 | Temp 98.0°F | Ht 64.0 in | Wt 188.0 lb

## 2021-11-23 DIAGNOSIS — E7849 Other hyperlipidemia: Secondary | ICD-10-CM

## 2021-11-23 DIAGNOSIS — E119 Type 2 diabetes mellitus without complications: Secondary | ICD-10-CM | POA: Diagnosis not present

## 2021-11-23 DIAGNOSIS — Z6835 Body mass index (BMI) 35.0-35.9, adult: Secondary | ICD-10-CM

## 2021-11-23 MED ORDER — METFORMIN HCL ER 500 MG PO TB24
500.0000 mg | ORAL_TABLET | Freq: Two times a day (BID) | ORAL | 0 refills | Status: DC
Start: 2021-11-23 — End: 2021-12-24

## 2021-11-23 NOTE — Progress Notes (Signed)
Chief Complaint:   OBESITY Deborah [name] is here to discuss her progress with her obesity treatment plan along with follow-up of her obesity related diagnoses. Deborah [name] is on practicing portion control and making smarter food choices, such as increasing vegetables and decreasing simple carbohydrates and states she is following her eating plan approximately 85% of the time. Deborah [name] states she is doing 0 minutes 0 times per week.  Today's visit was #: 13 Starting weight: 204 lbs Starting date: 02/06/2021 Today's weight: 188 lbs Today's date: 11/23/2021 Total lbs lost to date: 16 Total lbs lost since last in-office visit: 4  Interim History: Deborah [name] continues to do well with weight loss even over Thanksgiving. She is working on diet and trying to decrease sugar. She is working 2 days per week, and on her feet most of those days.  Subjective:   1. Type 2 diabetes mellitus without complication, without long-term current use of insulin (HCC) Deborah [name] is doing well on metformin, and her A1c is at goal. She denies nausea, vomiting, or hypoglycemia. I discussed labs with the patient today.  2. Other hyperlipidemia Deborah [name]'s LDL is not yet at goal on Crestor and with diet. She denies chest pain.  Assessment/Plan:   1. Type 2 diabetes mellitus without complication, without long-term current use of insulin (HCC) Deborah [name] will continue metformin, and we will refill for 90 days, with no refills. Good blood sugar control is important to decrease the likelihood of diabetic complications such as nephropathy, neuropathy, limb loss, blindness, coronary artery disease, and death. Intensive lifestyle modification including diet, exercise and weight loss are the first line of treatment for diabetes.   - metFORMIN (GLUCOPHAGE-XR) 500 MG 24 hr tablet; Take 1 tablet (500 mg total) by mouth 2 (two) times daily.  Dispense: 120 tablet; Refill: 0  2. Other hyperlipidemia Deborah [name] was educated on higher cholesterol  foods, including fatty animal products. She will continue diet, exercise and weight loss efforts. Orders and follow up as documented in patient record.   3. Obesity BMI today is 70 Deborah [name] is currently in the action stage of change. As such, her goal is to continue with weight loss efforts. She has agreed to the Category 2 Plan.   Behavioral modification strategies: increasing lean protein intake and decreasing simple carbohydrates.  Deborah [name] has agreed to follow-up with our clinic in 4 to 5 weeks. She was informed of the importance of frequent follow-up visits to maximize her success with intensive lifestyle modifications for her multiple health conditions.   Objective:   Blood pressure 111/73, pulse 69, temperature 98 F (36.7 C), height 5\' 4"  (1.626 m), weight 188 lb (85.3 kg), SpO2 99 %. Body mass index is 32.27 kg/m.  General: Cooperative, alert, well developed, in no acute distress. HEENT: Conjunctivae and lids unremarkable. Cardiovascular: Regular rhythm.  Lungs: Normal work of breathing. Neurologic: No focal deficits.   Lab Results  Component Value Date   CREATININE 0.66 10/28/2021   BUN 19 10/28/2021   NA 137 10/28/2021   K 4.5 10/28/2021   CL 97 10/28/2021   CO2 25 10/28/2021   Lab Results  Component Value Date   ALT 19 10/28/2021   AST 16 10/28/2021   ALKPHOS 86 10/28/2021   BILITOT 0.5 10/28/2021   Lab Results  Component Value Date   HGBA1C 6.0 (H) 10/28/2021   HGBA1C 5.9 (H) 07/02/2021   HGBA1C 5.6 02/18/2021   HGBA1C 6.1 (H) 01/27/2021   HGBA1C 6.3 (H) 10/21/2020   Lab Results  Component  Value Date   INSULIN 31.4 (H) 01/27/2021   Lab Results  Component Value Date   TSH 4.400 01/27/2021   Lab Results  Component Value Date   CHOL 177 10/28/2021   HDL 55 10/28/2021   LDLCALC 107 (H) 10/28/2021   TRIG 81 10/28/2021   CHOLHDL 3.2 10/28/2021   Lab Results  Component Value Date   VD25OH 61.9 07/02/2021   VD25OH 55.1 02/18/2021   VD25OH 50.5  01/27/2021   Lab Results  Component Value Date   WBC 5.0 10/28/2021   HGB 11.6 10/28/2021   HCT 35.9 10/28/2021   MCV 87 10/28/2021   PLT 418 10/28/2021   No results found for: IRON, TIBC, FERRITIN  Obesity Behavioral Intervention:   Approximately 15 minutes were spent on the discussion below.  ASK: We discussed the diagnosis of obesity with Deborah [name] today and Deborah [name] agreed to give [place] permission to discuss obesity behavioral modification therapy today.  ASSESS: Deborah [name] has the diagnosis of obesity and her BMI today is 32.3. Deborah [name] is in the action stage of change.   ADVISE: Deborah [name] was educated on the multiple health risks of obesity as well as the benefit of weight loss to improve her health. She was advised of the need for long term treatment and the importance of lifestyle modifications to improve her current health and to decrease her risk of future health problems.  AGREE: Multiple dietary modification options and treatment options were discussed and Deborah [name] agreed to follow the recommendations documented in the above note.  ARRANGE: Deborah [name] was educated on the importance of frequent visits to treat obesity as outlined per CMS and USPSTF guidelines and agreed to schedule her next follow up appointment today.  Attestation Statements:   Reviewed by clinician on day of visit: allergies, medications, problem list, medical history, surgical history, family history, social history, and previous encounter notes.   I, Trixie Dredge, am acting as transcriptionist for Dennard Nip, MD.  I have reviewed the above documentation for accuracy and completeness, and I agree with the above. -  Dennard Nip, MD

## 2021-11-24 ENCOUNTER — Encounter: Payer: Self-pay | Admitting: Nurse Practitioner

## 2021-11-25 ENCOUNTER — Other Ambulatory Visit: Payer: Self-pay | Admitting: Nurse Practitioner

## 2021-11-25 DIAGNOSIS — R053 Chronic cough: Secondary | ICD-10-CM

## 2021-11-25 MED ORDER — HYDROCODONE BIT-HOMATROP MBR 5-1.5 MG/5ML PO SOLN: 5.0000 mL | Freq: Four times a day (QID) | ORAL | 0 refills | Status: DC | PRN

## 2021-12-24 ENCOUNTER — Ambulatory Visit (INDEPENDENT_AMBULATORY_CARE_PROVIDER_SITE_OTHER): Payer: Medicare Other

## 2021-12-24 ENCOUNTER — Ambulatory Visit (INDEPENDENT_AMBULATORY_CARE_PROVIDER_SITE_OTHER): Payer: Medicare Other | Admitting: Physician Assistant

## 2021-12-24 ENCOUNTER — Encounter (INDEPENDENT_AMBULATORY_CARE_PROVIDER_SITE_OTHER): Payer: Self-pay | Admitting: Physician Assistant

## 2021-12-24 ENCOUNTER — Ambulatory Visit (INDEPENDENT_AMBULATORY_CARE_PROVIDER_SITE_OTHER): Payer: Medicare Other | Admitting: Family Medicine

## 2021-12-24 ENCOUNTER — Other Ambulatory Visit: Payer: Self-pay

## 2021-12-24 VITALS — BP 133/84 | HR 60 | Temp 97.6°F | Ht 64.0 in | Wt 187.0 lb

## 2021-12-24 DIAGNOSIS — E119 Type 2 diabetes mellitus without complications: Secondary | ICD-10-CM

## 2021-12-24 DIAGNOSIS — Z23 Encounter for immunization: Secondary | ICD-10-CM

## 2021-12-24 DIAGNOSIS — Z6835 Body mass index (BMI) 35.0-35.9, adult: Secondary | ICD-10-CM | POA: Diagnosis not present

## 2021-12-24 MED ORDER — METFORMIN HCL ER 500 MG PO TB24: 500.0000 mg | ORAL_TABLET | Freq: Two times a day (BID) | ORAL | 0 refills | Status: DC

## 2021-12-24 NOTE — Progress Notes (Signed)
Chief Complaint:   OBESITY Deborah [name] is here to discuss her progress with her obesity treatment plan along with follow-up of her obesity related diagnoses. Deborah [name] is on the Category 2 Plan and states she is following her eating plan approximately 75% of the time. Deborah [name] states she is walking for 15 minutes 2 times per week.  Today's visit was #: 14 Starting weight: 204 lbs Starting date: 02/06/2021 Today's weight: 187 lbs Today's date: 12/24/2021 Total lbs lost to date: 17 lbs Total lbs lost since last in-office visit: 1 lb  Interim History: Deborah [name] reports portion controlling well over the holidays and eating a lot of protein. She is tired of Category 2 plan and is wanting to try something different. She does not like journaling.   Subjective:   1. Type 2 diabetes mellitus without complication, without long-term current use of insulin (HCC) Deborah [name] is currently on Metformin twice daily. She notes no issues to report.  Assessment/Plan:   1. Type 2 diabetes mellitus without complication, without long-term current use of insulin (HCC) We will refill Metformin for 2 months with no refills. Good blood sugar control is important to decrease the likelihood of diabetic complications such as nephropathy, neuropathy, limb loss, blindness, coronary artery disease, and death. Intensive lifestyle modification including diet, exercise and weight loss are the first line of treatment for diabetes.   - metFORMIN (GLUCOPHAGE-XR) 500 MG 24 hr tablet; Take 1 tablet (500 mg total) by mouth 2 (two) times daily.  Dispense: 120 tablet; Refill: 0  2. Obesity BMI today is 32.08 Deborah [name] is currently in the action stage of change. As such, her goal is to continue with weight loss efforts. She has agreed to the [employer].   Exercise goals:  As is.  Behavioral modification strategies: meal planning and cooking strategies and planning for success.  Deborah [name] has agreed to follow-up with our clinic in 3  weeks. She was informed of the importance of frequent follow-up visits to maximize her success with intensive lifestyle modifications for her multiple health conditions.   Objective:   Blood pressure 133/84, pulse 60, temperature 97.6 F (36.4 C), height 5\' 4"  (1.626 m), weight 187 lb (84.8 kg), SpO2 97 %. Body mass index is 32.1 kg/m.  General: Cooperative, alert, well developed, in no acute distress. HEENT: Conjunctivae and lids unremarkable. Cardiovascular: Regular rhythm.  Lungs: Normal work of breathing. Neurologic: No focal deficits.   Lab Results  Component Value Date   CREATININE 0.66 10/28/2021   BUN 19 10/28/2021   NA 137 10/28/2021   K 4.5 10/28/2021   CL 97 10/28/2021   CO2 25 10/28/2021   Lab Results  Component Value Date   ALT 19 10/28/2021   AST 16 10/28/2021   ALKPHOS 86 10/28/2021   BILITOT 0.5 10/28/2021   Lab Results  Component Value Date   HGBA1C 6.0 (H) 10/28/2021   HGBA1C 5.9 (H) 07/02/2021   HGBA1C 5.6 02/18/2021   HGBA1C 6.1 (H) 01/27/2021   HGBA1C 6.3 (H) 10/21/2020   Lab Results  Component Value Date   INSULIN 31.4 (H) 01/27/2021   Lab Results  Component Value Date   TSH 4.400 01/27/2021   Lab Results  Component Value Date   CHOL 177 10/28/2021   HDL 55 10/28/2021   LDLCALC 107 (H) 10/28/2021   TRIG 81 10/28/2021   CHOLHDL 3.2 10/28/2021   Lab Results  Component Value Date   VD25OH 61.9 07/02/2021   VD25OH 55.1 02/18/2021   VD25OH 50.5  01/27/2021   Lab Results  Component Value Date   WBC 5.0 10/28/2021   HGB 11.6 10/28/2021   HCT 35.9 10/28/2021   MCV 87 10/28/2021   PLT 418 10/28/2021   No results found for: IRON, TIBC, FERRITIN  Obesity Behavioral Intervention:   Approximately 15 minutes were spent on the discussion below.  ASK: We discussed the diagnosis of obesity with Deborah [name] today and Deborah [name] agreed to give [place] permission to discuss obesity behavioral modification therapy today.  ASSESS: Deborah [name] has the  diagnosis of obesity and her BMI today is 32.1. Deborah [name] is in the action stage of change.   ADVISE: Deborah [name] was educated on the multiple health risks of obesity as well as the benefit of weight loss to improve her health. She was advised of the need for long term treatment and the importance of lifestyle modifications to improve her current health and to decrease her risk of future health problems.  AGREE: Multiple dietary modification options and treatment options were discussed and Deborah [name] agreed to follow the recommendations documented in the above note.  ARRANGE: Deborah [name] was educated on the importance of frequent visits to treat obesity as outlined per CMS and USPSTF guidelines and agreed to schedule her next follow up appointment today.  Attestation Statements:   Reviewed by clinician on day of visit: allergies, medications, problem list, medical history, surgical history, family history, social history, and previous encounter notes.   I, Tonye Pearson, am acting as Location manager for [employer], PA-C.  I have reviewed the above documentation for accuracy and completeness, and I agree with the above. Abby Potash, PA-C

## 2021-12-24 NOTE — Progress Notes (Signed)
° °  Covid-19 Vaccination Clinic  Name:  Deborah [name]    MRN: [id number] DOB: 02-25-1951  12/24/2021  [name] was observed post Covid-19 immunization for 15 minutes without incident. She was provided with Vaccine Information Sheet and instruction to access the V-Safe system.   [name] was instructed to call 911 with any severe reactions post vaccine: Difficulty breathing  Swelling of face and throat  A fast heartbeat  A bad rash all over body  Dizziness and weakness   Immunizations Administered     Name Date Dose VIS Date Route   Pfizer Covid-19 Vaccine Bivalent Booster 12/24/2021  9:35 AM 0.3 mL 08/19/2021 Intramuscular   Manufacturer: [place]   Lot: [id number]   [place]: [phone]

## 2022-01-08 ENCOUNTER — Other Ambulatory Visit: Payer: Self-pay | Admitting: Nurse Practitioner

## 2022-01-08 DIAGNOSIS — E039 Hypothyroidism, unspecified: Secondary | ICD-10-CM

## 2022-01-13 ENCOUNTER — Encounter: Payer: Self-pay | Admitting: Nurse Practitioner

## 2022-01-14 ENCOUNTER — Other Ambulatory Visit: Payer: Self-pay

## 2022-01-14 ENCOUNTER — Encounter (INDEPENDENT_AMBULATORY_CARE_PROVIDER_SITE_OTHER): Payer: Self-pay | Admitting: Physician Assistant

## 2022-01-14 ENCOUNTER — Ambulatory Visit (INDEPENDENT_AMBULATORY_CARE_PROVIDER_SITE_OTHER): Payer: Medicare Other | Admitting: Physician Assistant

## 2022-01-14 VITALS — BP 131/74 | HR 74 | Temp 98.2°F | Ht 64.0 in | Wt 190.0 lb

## 2022-01-14 DIAGNOSIS — E785 Hyperlipidemia, unspecified: Secondary | ICD-10-CM

## 2022-01-14 DIAGNOSIS — E1169 Type 2 diabetes mellitus with other specified complication: Secondary | ICD-10-CM

## 2022-01-14 DIAGNOSIS — Z6832 Body mass index (BMI) 32.0-32.9, adult: Secondary | ICD-10-CM

## 2022-01-14 DIAGNOSIS — E669 Obesity, unspecified: Secondary | ICD-10-CM

## 2022-01-14 NOTE — Progress Notes (Signed)
Chief Complaint:   OBESITY Ieasha is here to discuss her progress with her obesity treatment plan along with follow-up of her obesity related diagnoses. Zoraya is on the [employer] and states she is following her eating plan approximately 70% of the time. Terrica states she is walking in the back yard for 15-20 minutes 1 times per week.  Today's visit was #: 15 Starting weight: 204 lbs Starting date: 02/06/2021 Today's weight: 190 lbs Today's date: 01/14/2022 Total lbs lost to date: 14 lbs Total lbs lost since last in-office visit: 0  Interim History: Aleisa did not like the [employer] and did not follow it long ( about a week and a half). She reports falling off the wagon. She is mindful of her protein.   Subjective:   1. Hyperlipidemia associated with type 2 diabetes mellitus ([place]) Jonda's last LDL was not at goal. She is walking for exercise 1 day a week. She is currently on Crestor and tolerating it well.  Assessment/Plan:   1. Hyperlipidemia associated with type 2 diabetes mellitus ([place]) Cardiovascular risk and specific lipid/LDL goals reviewed.  We will continue to monitor cholesterol and she will continue with medication , plan and exercise. Orders and follow up as documented in patient record.   Counseling Intensive lifestyle modifications are the first line treatment for this issue. Dietary changes: Increase soluble fiber. Decrease simple carbohydrates. Exercise changes: Moderate to vigorous-intensity aerobic activity 150 minutes per week if tolerated. Lipid-lowering medications: see documented in medical record.  2. Obesity BMI today is 32.6 Ayen is currently in the action stage of change. As such, her goal is to continue with weight loss efforts. [place] was changed to the Category 2 Plan.   Exercise goals:  As is.  Behavioral modification strategies: meal planning and cooking strategies and keeping healthy foods in the home.  Kaydie has  agreed to follow-up with our clinic in 3 weeks. She was informed of the importance of frequent follow-up visits to maximize her success with intensive lifestyle modifications for her multiple health conditions.   Objective:   Blood pressure 131/74, pulse 74, temperature 98.2 F (36.8 C), height 5\' 4"  (1.626 m), weight 190 lb (86.2 kg), SpO2 99 %. Body mass index is 32.61 kg/m.  General: Cooperative, alert, well developed, in no acute distress. HEENT: Conjunctivae and lids unremarkable. Cardiovascular: Regular rhythm.  Lungs: Normal work of breathing. Neurologic: No focal deficits.   Lab Results  Component Value Date   CREATININE 0.66 10/28/2021   BUN 19 10/28/2021   NA 137 10/28/2021   K 4.5 10/28/2021   CL 97 10/28/2021   CO2 25 10/28/2021   Lab Results  Component Value Date   ALT 19 10/28/2021   AST 16 10/28/2021   ALKPHOS 86 10/28/2021   BILITOT 0.5 10/28/2021   Lab Results  Component Value Date   HGBA1C 6.0 (H) 10/28/2021   HGBA1C 5.9 (H) 07/02/2021   HGBA1C 5.6 02/18/2021   HGBA1C 6.1 (H) 01/27/2021   HGBA1C 6.3 (H) 10/21/2020   Lab Results  Component Value Date   INSULIN 31.4 (H) 01/27/2021   Lab Results  Component Value Date   TSH 4.400 01/27/2021   Lab Results  Component Value Date   CHOL 177 10/28/2021   HDL 55 10/28/2021   LDLCALC 107 (H) 10/28/2021   TRIG 81 10/28/2021   CHOLHDL 3.2 10/28/2021   Lab Results  Component Value Date   VD25OH 61.9 07/02/2021   VD25OH 55.1 02/18/2021  VD25OH 50.5 01/27/2021   Lab Results  Component Value Date   WBC 5.0 10/28/2021   HGB 11.6 10/28/2021   HCT 35.9 10/28/2021   MCV 87 10/28/2021   PLT 418 10/28/2021   No results found for: IRON, TIBC, FERRITIN  Obesity Behavioral Intervention:   Approximately 15 minutes were spent on the discussion below.  ASK: We discussed the diagnosis of obesity with [name] today and Annise agreed to give [place] permission to discuss obesity behavioral modification  therapy today.  ASSESS: Maryjean has the diagnosis of obesity and her BMI today is 32.7. Braelyn is in the action stage of change.   ADVISE: Emmaline was educated on the multiple health risks of obesity as well as the benefit of weight loss to improve her health. She was advised of the need for long term treatment and the importance of lifestyle modifications to improve her current health and to decrease her risk of future health problems.  AGREE: Multiple dietary modification options and treatment options were discussed and Issabella agreed to follow the recommendations documented in the above note.  ARRANGE: Maddilynn was educated on the importance of frequent visits to treat obesity as outlined per CMS and USPSTF guidelines and agreed to schedule her next follow up appointment today.  Attestation Statements:   Reviewed by clinician on day of visit: allergies, medications, problem list, medical history, surgical history, family history, social history, and previous encounter notes.  I, Tonye Pearson, am acting as Location manager for [employer], PA-C.  I have reviewed the above documentation for accuracy and completeness, and I agree with the above. Abby Potash, PA-C

## 2022-01-16 ENCOUNTER — Other Ambulatory Visit (INDEPENDENT_AMBULATORY_CARE_PROVIDER_SITE_OTHER): Payer: Self-pay | Admitting: Family Medicine

## 2022-01-16 DIAGNOSIS — E119 Type 2 diabetes mellitus without complications: Secondary | ICD-10-CM

## 2022-01-18 NOTE — Telephone Encounter (Signed)
Deborah [name] 

## 2022-01-19 DIAGNOSIS — E785 Hyperlipidemia, unspecified: Secondary | ICD-10-CM | POA: Diagnosis not present

## 2022-01-19 DIAGNOSIS — E119 Type 2 diabetes mellitus without complications: Secondary | ICD-10-CM | POA: Diagnosis not present

## 2022-01-19 DIAGNOSIS — Z86010 Personal history of colonic polyps: Secondary | ICD-10-CM | POA: Diagnosis not present

## 2022-01-19 DIAGNOSIS — K5730 Diverticulosis of large intestine without perforation or abscess without bleeding: Secondary | ICD-10-CM | POA: Diagnosis not present

## 2022-01-19 DIAGNOSIS — K429 Umbilical hernia without obstruction or gangrene: Secondary | ICD-10-CM | POA: Diagnosis not present

## 2022-01-19 DIAGNOSIS — Z1211 Encounter for screening for malignant neoplasm of colon: Secondary | ICD-10-CM | POA: Diagnosis not present

## 2022-01-19 NOTE — Telephone Encounter (Signed)
Mychart message sent.

## 2022-01-24 ENCOUNTER — Encounter: Payer: Self-pay | Admitting: Nurse Practitioner

## 2022-01-25 ENCOUNTER — Encounter: Payer: Self-pay | Admitting: Nurse Practitioner

## 2022-01-25 ENCOUNTER — Other Ambulatory Visit: Payer: Self-pay | Admitting: Nurse Practitioner

## 2022-01-25 DIAGNOSIS — R053 Chronic cough: Secondary | ICD-10-CM

## 2022-01-26 MED ORDER — HYDROCODONE BIT-HOMATROP MBR 5-1.5 MG/5ML PO SOLN: 5.0000 mL | Freq: Four times a day (QID) | ORAL | 0 refills | Status: DC | PRN

## 2022-01-27 DIAGNOSIS — H53423 Scotoma of blind spot area, bilateral: Secondary | ICD-10-CM | POA: Diagnosis not present

## 2022-01-27 DIAGNOSIS — E119 Type 2 diabetes mellitus without complications: Secondary | ICD-10-CM | POA: Diagnosis not present

## 2022-01-27 DIAGNOSIS — H2513 Age-related nuclear cataract, bilateral: Secondary | ICD-10-CM | POA: Diagnosis not present

## 2022-01-27 DIAGNOSIS — H401131 Primary open-angle glaucoma, bilateral, mild stage: Secondary | ICD-10-CM | POA: Diagnosis not present

## 2022-01-27 DIAGNOSIS — H43813 Vitreous degeneration, bilateral: Secondary | ICD-10-CM | POA: Diagnosis not present

## 2022-02-03 ENCOUNTER — Encounter (INDEPENDENT_AMBULATORY_CARE_PROVIDER_SITE_OTHER): Payer: Self-pay

## 2022-02-04 ENCOUNTER — Ambulatory Visit (INDEPENDENT_AMBULATORY_CARE_PROVIDER_SITE_OTHER): Payer: Medicare Other | Admitting: Physician Assistant

## 2022-02-04 ENCOUNTER — Ambulatory Visit (INDEPENDENT_AMBULATORY_CARE_PROVIDER_SITE_OTHER): Payer: Medicare Other | Admitting: Family Medicine

## 2022-02-04 ENCOUNTER — Encounter (INDEPENDENT_AMBULATORY_CARE_PROVIDER_SITE_OTHER): Payer: Self-pay | Admitting: Family Medicine

## 2022-02-04 ENCOUNTER — Other Ambulatory Visit: Payer: Self-pay

## 2022-02-04 VITALS — BP 113/72 | HR 67 | Temp 98.3°F | Ht 64.0 in | Wt 188.0 lb

## 2022-02-04 DIAGNOSIS — E039 Hypothyroidism, unspecified: Secondary | ICD-10-CM | POA: Diagnosis not present

## 2022-02-04 DIAGNOSIS — E1169 Type 2 diabetes mellitus with other specified complication: Secondary | ICD-10-CM

## 2022-02-04 DIAGNOSIS — E559 Vitamin D deficiency, unspecified: Secondary | ICD-10-CM | POA: Diagnosis not present

## 2022-02-04 DIAGNOSIS — E669 Obesity, unspecified: Secondary | ICD-10-CM

## 2022-02-04 DIAGNOSIS — Z6832 Body mass index (BMI) 32.0-32.9, adult: Secondary | ICD-10-CM

## 2022-02-04 DIAGNOSIS — E119 Type 2 diabetes mellitus without complications: Secondary | ICD-10-CM

## 2022-02-04 DIAGNOSIS — Z7984 Long term (current) use of oral hypoglycemic drugs: Secondary | ICD-10-CM | POA: Diagnosis not present

## 2022-02-04 DIAGNOSIS — E538 Deficiency of other specified B group vitamins: Secondary | ICD-10-CM | POA: Diagnosis not present

## 2022-02-04 NOTE — Progress Notes (Signed)
Chief Complaint:   OBESITY Deborah [name] is here to discuss her progress with her obesity treatment plan along with follow-up of her obesity related diagnoses. Deborah [name] is on the Category 2 Plan and states she is following her eating plan approximately 85% of the time. Deborah [name] states she is doing very little.   Today's visit was #: 16 Starting weight: 204 lbs Starting date: 02/06/2021 Today's weight: 188 lbs Today's date: 02/04/2022 Total lbs lost to date: 16 Total lbs lost since last in-office visit: 2  Interim History: Deborah [name] continues to do well with weight loss on her plan. She is getting bored with her breakfast options.  Subjective:   1. Type 2 diabetes mellitus without complication, without long-term current use of insulin (HCC) Deborah [name] is working on diet and weight loss. Her fasting blood sugars mostly range between 98-115.  2. B12 deficiency Deborah [name]'s last B12 level was over-replaced previously, and she is off B12 and now notes significant fatigue.  3. Acquired hypothyroidism Deborah [name] was changed from Synthroid to levothyroxine, and she notes increased fatigue.  4. Vitamin D deficiency Deborah [name]'s last Vit D level was 61.9, and she notes fatigue. She is due for labs today.  Assessment/Plan:   1. Type 2 diabetes mellitus without complication, without long-term current use of insulin (HCC) Deborah [name] will continue metformin and diet, and will continue to decrease simple carbohydrates. Good blood sugar control is important to decrease the likelihood of diabetic complications such as nephropathy, neuropathy, limb loss, blindness, coronary artery disease, and death. Intensive lifestyle modification including diet, exercise and weight loss are the first line of treatment for diabetes.   2. B12 deficiency The diagnosis was reviewed with the patient. Counseling provided today. We will check labs today. If Deborah [name]'s B12 is below 1,000 then it is ok to restart OTC B12 1,000 mcg daily.  Orders and follow up as documented in patient record.  - Vitamin B12  3. Acquired hypothyroidism We will check labs today, and will follow up at [facility] next visit. Orders and follow up as documented in patient record.  - TSH  4. Vitamin D deficiency Low Vitamin D level contributes to fatigue and are associated with obesity, breast, and colon cancer. We will check labs today. Deborah [name] will follow-up for routine testing of Vitamin D, at least 2-3 times per year to avoid over-replacement.  - VITAMIN D 25 Hydroxy (Vit-D Deficiency, Fractures)  5. Obesity with current BMI 32.3 Deborah [name] is currently in the action stage of change. As such, her goal is to continue with weight loss efforts. She has agreed to the Category 2 Plan with breakfast options.   Behavioral modification strategies: increasing lean protein intake.  Deborah [name] has agreed to follow-up with our clinic in 3 to 4 weeks. She was informed of the importance of frequent follow-up visits to maximize her success with intensive lifestyle modifications for her multiple health conditions.   Deborah [name] was informed we would discuss her lab results at her next visit unless there is a critical issue that needs to be addressed sooner. Deborah [name] agreed to keep her next visit at the agreed upon time to discuss these results.  Objective:   Blood pressure 113/72, pulse 67, temperature 98.3 F (36.8 C), height 5\' 4"  (1.626 m), weight 188 lb (85.3 kg), SpO2 99 %. Body mass index is 32.27 kg/m.  General: Cooperative, alert, well developed, in no acute distress. HEENT: Conjunctivae and lids unremarkable. Cardiovascular: Regular rhythm.  Lungs: Normal work of breathing. Neurologic: No focal deficits.  Lab Results  Component Value Date   CREATININE 0.66 10/28/2021   BUN 19 10/28/2021   NA 137 10/28/2021   K 4.5 10/28/2021   CL 97 10/28/2021   CO2 25 10/28/2021   Lab Results  Component Value Date   ALT 19 10/28/2021   AST 16 10/28/2021    ALKPHOS 86 10/28/2021   BILITOT 0.5 10/28/2021   Lab Results  Component Value Date   HGBA1C 6.0 (H) 10/28/2021   HGBA1C 5.9 (H) 07/02/2021   HGBA1C 5.6 02/18/2021   HGBA1C 6.1 (H) 01/27/2021   HGBA1C 6.3 (H) 10/21/2020   Lab Results  Component Value Date   INSULIN 31.4 (H) 01/27/2021   Lab Results  Component Value Date   TSH 4.400 01/27/2021   Lab Results  Component Value Date   CHOL 177 10/28/2021   HDL 55 10/28/2021   LDLCALC 107 (H) 10/28/2021   TRIG 81 10/28/2021   CHOLHDL 3.2 10/28/2021   Lab Results  Component Value Date   VD25OH 61.9 07/02/2021   VD25OH 55.1 02/18/2021   VD25OH 50.5 01/27/2021   Lab Results  Component Value Date   WBC 5.0 10/28/2021   HGB 11.6 10/28/2021   HCT 35.9 10/28/2021   MCV 87 10/28/2021   PLT 418 10/28/2021   No results found for: IRON, TIBC, FERRITIN  Obesity Behavioral Intervention:   Approximately 15 minutes were spent on the discussion below.  ASK: We discussed the diagnosis of obesity with Deborah [name] today and Deborah [name] agreed to give [place] permission to discuss obesity behavioral modification therapy today.  ASSESS: Deborah [name] has the diagnosis of obesity and her BMI today is 32.3. Deborah [name] is in the action stage of change.   ADVISE: Deborah [name] was educated on the multiple health risks of obesity as well as the benefit of weight loss to improve her health. She was advised of the need for long term treatment and the importance of lifestyle modifications to improve her current health and to decrease her risk of future health problems.  AGREE: Multiple dietary modification options and treatment options were discussed and Deborah [name] agreed to follow the recommendations documented in the above note.  ARRANGE: Deborah [name] was educated on the importance of frequent visits to treat obesity as outlined per CMS and USPSTF guidelines and agreed to schedule her next follow up appointment today.  Attestation Statements:   Reviewed by clinician on  day of visit: allergies, medications, problem list, medical history, surgical history, family history, social history, and previous encounter notes.   I, Trixie Dredge, am acting as transcriptionist for Dennard Nip, MD.  I have reviewed the above documentation for accuracy and completeness, and I agree with the above. -  Dennard Nip, MD

## 2022-02-05 LAB — VITAMIN B12: Vitamin B-12: 1690 pg/mL — ABNORMAL HIGH (ref 232–1245)

## 2022-02-05 LAB — TSH: TSH: 0.55 u[IU]/mL (ref 0.450–4.500)

## 2022-02-05 LAB — VITAMIN D 25 HYDROXY (VIT D DEFICIENCY, FRACTURES): Vit D, 25-Hydroxy: 52 ng/mL (ref 30.0–100.0)

## 2022-03-01 ENCOUNTER — Other Ambulatory Visit: Payer: Self-pay

## 2022-03-01 ENCOUNTER — Encounter: Payer: Self-pay | Admitting: Nurse Practitioner

## 2022-03-01 ENCOUNTER — Ambulatory Visit (INDEPENDENT_AMBULATORY_CARE_PROVIDER_SITE_OTHER): Payer: Medicare Other | Admitting: Nurse Practitioner

## 2022-03-01 VITALS — BP 122/78 | HR 80 | Temp 98.3°F | Ht 64.0 in | Wt 190.0 lb

## 2022-03-01 DIAGNOSIS — I10 Essential (primary) hypertension: Secondary | ICD-10-CM

## 2022-03-01 DIAGNOSIS — Z6832 Body mass index (BMI) 32.0-32.9, adult: Secondary | ICD-10-CM

## 2022-03-01 DIAGNOSIS — Z23 Encounter for immunization: Secondary | ICD-10-CM | POA: Diagnosis not present

## 2022-03-01 DIAGNOSIS — R7303 Prediabetes: Secondary | ICD-10-CM | POA: Diagnosis not present

## 2022-03-01 DIAGNOSIS — E039 Hypothyroidism, unspecified: Secondary | ICD-10-CM | POA: Diagnosis not present

## 2022-03-01 DIAGNOSIS — E6609 Other obesity due to excess calories: Secondary | ICD-10-CM

## 2022-03-01 DIAGNOSIS — I700 Atherosclerosis of aorta: Secondary | ICD-10-CM

## 2022-03-01 MED ORDER — SHINGRIX 50 MCG/0.5ML IM SUSR: 0.5000 mL | Freq: Once | INTRAMUSCULAR | 0 refills | Status: AC

## 2022-03-01 NOTE — Progress Notes (Signed)
I,Tianna Badgett,acting as a Education administrator for [employer], FNP.,have documented all relevant documentation on the behalf of Minette Brine, FNP,as directed by  Minette Brine, FNP while in the presence of Minette Brine, [place].  This visit occurred during the SARS-CoV-2 public health emergency.  Safety protocols were in place, including screening questions prior to the visit, additional usage of staff PPE, and extensive cleaning of exam room while observing appropriate contact time as indicated for disinfecting solutions.  Subjective:     Patient ID: Deborah [name] , female    DOB: 1951/09/30 , 71 y.o.   MRN: [id number]   Chief Complaint  Patient presents with   Hypertension   Diabetes    HPI  Patient presents today for a f/u on her diabetes and blood pressure. She is scheduled for a colonoscopy scheduled for end of the month.  She sees Dr Katy Fitch for her eye exams  Wt Readings from Last 3 Encounters: 03/01/22 : 190 lb (86.2 kg) 02/04/22 : 188 lb (85.3 kg) 01/14/22 : 190 lb (86.2 kg)    Hypertension This is a chronic problem. The current episode started more than 1 year ago. The problem has been gradually improving since onset. The problem is controlled. Pertinent negatives include no blurred vision, chest pain, headaches, malaise/fatigue, palpitations or peripheral edema. There are no associated agents to hypertension. Risk factors for coronary artery disease include dyslipidemia, diabetes mellitus, obesity and sedentary lifestyle. Past treatments include diuretics. The current treatment provides moderate improvement. Compliance problems include exercise.  There is no history of angina. There is no history of chronic renal disease.  Diabetes She presents for her follow-up diabetic visit. Diabetes type: prediabetes. Her disease course has been stable. There are no hypoglycemic associated symptoms. Pertinent negatives for hypoglycemia include no dizziness or headaches. There are no diabetic  associated symptoms. Pertinent negatives for diabetes include no blurred vision, no chest pain, no fatigue, no polydipsia, no polyphagia and no polyuria. There are no hypoglycemic complications. Symptoms are stable. There are no diabetic complications. Risk factors for coronary artery disease include diabetes mellitus, obesity and sedentary lifestyle. She is compliant with treatment most of the time. Her weight is stable. She is following a generally unhealthy diet. When asked about meal planning, she reported none. She has not had a previous visit with a dietitian. She rarely participates in exercise. There is no change in her home blood glucose trend. 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 She does not see a podiatrist.Eye exam is current (08/23/2019).    Past Medical History:  Diagnosis Date   Back pain    Death of child 70   Due to cord strangulation.   Diabetes mellitus without complication (HCC)    Hyperlipidemia    Hypertension    Hypothyroidism    Joint pain    Osteoarthritis    Other fatigue    Shortness of breath on exertion    Thyroid condition      Family History  Problem Relation Age of Onset   COPD Mother    Diabetes Mother    Hypertension Mother    Obesity Mother    Heart disease Father    Hypertension Father    Glaucoma Father      Current Outpatient Medications:    acetaminophen (TYLENOL) 500 MG tablet, Take 1,000 mg by mouth at bedtime., Disp: ,  Rfl:    Blood Glucose Monitoring Suppl (ACURA BLOOD GLUCOSE METER) w/Device KIT, Check blood sugars twice daily. Please provide patient with meter and supplies that is covered by pt ins., Disp: 1 kit, Rfl: 0   Blood Glucose Monitoring Suppl (ONE TOUCH ULTRA MINI) w/Device KIT, Use to check blood sugars 3 times a day. Dx code:e11.65, Disp: 1 kit, Rfl: 0   cholecalciferol  (VITAMIN D) 1000 units tablet, Take 2,000 Units by mouth daily., Disp: , Rfl:    cyclobenzaprine (FLEXERIL) 10 MG tablet, Take 1 tablet (10 mg total) by mouth 2 (two) times daily as needed for muscle spasms., Disp: 20 tablet, Rfl: 0   diclofenac Sodium (VOLTAREN) 1 % GEL, Apply 4 g topically 4 (four) times daily., Disp: 100 g, Rfl: 0   docusate sodium (COLACE) 100 MG capsule, Take 100 mg by mouth daily., Disp: , Rfl:    hydrALAZINE (APRESOLINE) 100 MG tablet, TAKE 1 TABLET BY MOUTH  TWICE DAILY, Disp: 180 tablet, Rfl: 3   hydrochlorothiazide (HYDRODIURIL) 25 MG tablet, TAKE 1 TABLET BY MOUTH  DAILY, Disp: 30 tablet, Rfl: 11   HYDROcodone bit-homatropine (HYDROMET) 5-1.5 MG/5ML syrup, Take 5 mLs by mouth every 6 (six) hours as needed for cough., Disp: 120 mL, Rfl: 0   latanoprost (XALATAN) 0.005 % ophthalmic solution, Place 1 drop into both eyes at bedtime., Disp: , Rfl:    levothyroxine (SYNTHROID) 100 MCG tablet, TAKE 1 TABLET BY MOUTH  DAILY BEFORE BREAKFAST, Disp: 90 tablet, Rfl: 3   MAGNESIUM PO, Take 375 mg by mouth daily., Disp: , Rfl:    melatonin 5 MG TABS, Take 5 mg by mouth at bedtime., Disp: , Rfl:    metFORMIN (GLUCOPHAGE-XR) 500 MG 24 hr tablet, Take 1 tablet (500 mg total) by mouth 2 (two) times daily., Disp: 120 tablet, Rfl: 0   ONETOUCH ULTRA test strip, USE WITH METER TO CHECK  BLOOD SUGAR TWICE DAILY  BEFORE BREAKFAST AND BEFORE DINNER, Disp: 200 strip, Rfl: 3   rosuvastatin (CRESTOR) 5 MG tablet, TAKE 1 TABLET BY MOUTH EVERY DAY (MONDAY, WEDNESAY, FRIDAY), Disp: 90 tablet, Rfl: 1   spironolactone (ALDACTONE) 25 MG tablet, TAKE 1 TABLET BY MOUTH  DAILY, Disp: 30 tablet, Rfl: 11   timolol (BETIMOL) 0.25 % ophthalmic solution, Place 1 drop into both eyes daily., Disp: , Rfl:    Turmeric 500 MG CAPS, Take 1 tablet by mouth as needed (Takes occassionally)., Disp: , Rfl:    Zoster Vaccine Adjuvanted (SHINGRIX) injection, Inject 0.5 mLs into the muscle once for 1 dose., Disp: 0.5 mL,  Rfl: 0   Allergies  Allergen Reactions   Amlodipine     headache   Aspartame And Phenylalanine Other (See Comments)    Causes migraines   Bidil [Isosorb Dinitrate-Hydralazine]     headache   Erythromycin     Ask patient to specify type/severity/reaction. Not indicated on medical history form dated 11/04/10.   Fentanyl     Causes panic attacks   Ibuprofen     Ask patient to specify type/severity/reaction. Not noted on medical history form dated 11/04/10.   Phenylalanine Other (See Comments)    Causes migraine   Prilosec [Omeprazole] Nausea And Vomiting   Tramadol Nausea And Vomiting   Valsartan Cough   Zantac [Ranitidine Hcl]      Review of Systems  Constitutional: Negative.  Negative for fatigue and malaise/fatigue.  Eyes:  Negative for blurred vision.  Respiratory: Negative.    Cardiovascular: Negative.  Negative for chest  pain, palpitations and leg swelling.  Gastrointestinal: Negative.   Endocrine: Negative for polydipsia, polyphagia and polyuria.  Neurological: Negative.  Negative for dizziness and headaches.  Psychiatric/Behavioral: Negative.      Today's Vitals   03/01/22 0850  BP: 122/78  Pulse: 80  Temp: 98.3 F (36.8 C)  TempSrc: Oral  Weight: 190 lb (86.2 kg)  Height: _0  (1.626 m)   Body mass index is 32.61 kg/m.  Wt Readings from Last 3 Encounters:  03/01/22 190 lb (86.2 kg)  02/04/22 188 lb (85.3 kg)  01/14/22 190 lb (86.2 kg)    Objective:  Physical Exam Vitals reviewed.  Constitutional:      General: She is not in acute distress.    Appearance: Normal appearance. She is obese.  Cardiovascular:     Rate and Rhythm: Normal rate and regular rhythm.     Pulses: Normal pulses.     Heart sounds: Normal heart sounds. No murmur heard. Pulmonary:     Effort: Pulmonary effort is normal. No respiratory distress.     Breath sounds: Normal breath sounds. No wheezing.  Musculoskeletal:        General: No swelling or tenderness.     Right lower  leg: No edema.     Left lower leg: No edema.     Comments: Walks with bent over position  Skin:    General: Skin is warm and dry.     Capillary Refill: Capillary refill takes less than 2 seconds.     Coloration: Skin is not jaundiced.  Neurological:     General: No focal deficit present.     Mental Status: She is alert and oriented to person, place, and time.     Cranial Nerves: No cranial nerve deficit.     Motor: No weakness.  Psychiatric:        Mood and Affect: Mood normal.        Behavior: Behavior normal.        Thought Content: Thought content normal.        Judgment: Judgment normal.        Assessment And Plan:     1. Prediabetes Stable, continue metformin, tolerating medications  2. Essential hypertension Blood pressure is well controlled, continue current medications  3. Acquired hypothyroidism Good control, continue current medications  4. Atherosclerosis of aorta (HCC) continue statin, tolerating well.  5. Encounter for immunization Rx sent to pharmacy at patient request, offered to give here in the office. - Zoster Vaccine Adjuvanted [employer]) injection; Inject 0.5 mLs into the muscle once for 1 dose.  Dispense: 0.5 mL; Refill: 0  6. Class 1 obesity due to excess calories with serious comorbidity and body mass index (BMI) of 32.0 to 32.9 in adult Discussed the differences in the stages of obesity according to the guidelines and reassured not intended to be offensive.    Patient was given opportunity to ask questions. Patient verbalized understanding of the plan and was able to repeat key elements of the plan. All questions were answered to their satisfaction.  Minette Brine, FNP   I, Minette Brine, FNP, have reviewed all documentation for this visit. The documentation on 03/01/22 for the exam, diagnosis, procedures, and orders are all accurate and complete.   IF YOU HAVE BEEN REFERRED TO A SPECIALIST, IT MAY TAKE 1-2 WEEKS TO SCHEDULE/PROCESS THE REFERRAL.  IF YOU HAVE NOT HEARD FROM US/SPECIALIST IN TWO WEEKS, PLEASE GIVE [place] A CALL AT [phone].   THE PATIENT IS  ENCOURAGED TO PRACTICE SOCIAL DISTANCING DUE TO THE COVID-19 PANDEMIC.

## 2022-03-01 NOTE — Patient Instructions (Addendum)
Hypertension, Adult High blood pressure (hypertension) is when the force of blood pumping through the arteries is too strong. The arteries are the blood vessels that carry blood from the heart throughout the body. Hypertension forces the heart to work harder to pump blood and may cause arteries to become narrow or stiff. Untreated or uncontrolled hypertension can cause a heart attack, heart failure, a stroke, kidney disease, and other problems. A blood pressure reading consists of a higher number over a lower number. Ideally, your blood pressure should be below 120/80. The first ("top") number is called the systolic pressure. It is a measure of the pressure in your arteries as your heart beats. The second ("bottom") number is called the diastolic pressure. It is a measure of the pressure in your arteries as the heart relaxes. What are the causes? The exact cause of this condition is not known. There are some conditions that result in or are related to high blood pressure. What increases the risk? Some risk factors for high blood pressure are under your control. The following factors may make you more likely to develop this condition: Smoking. Having type 2 diabetes mellitus, high cholesterol, or both. Not getting enough exercise or physical activity. Being overweight. Having too much fat, sugar, calories, or salt (sodium) in your diet. Drinking too much alcohol. Some risk factors for high blood pressure may be difficult or impossible to change. Some of these factors include: Having chronic kidney disease. Having a family history of high blood pressure. Age. Risk increases with age. Race. You may be at higher risk if you are African American. Gender. Men are at higher risk than women before age 20. After age 105, women are at higher risk than men. Having obstructive sleep apnea. Stress. What are the signs or symptoms? High blood pressure may not cause symptoms. Very high blood pressure  (hypertensive crisis) may cause: Headache. Anxiety. Shortness of breath. Nosebleed. Nausea and vomiting. Vision changes. Severe chest pain. Seizures. How is this diagnosed? This condition is diagnosed by measuring your blood pressure while you are seated, with your arm resting on a flat surface, your legs uncrossed, and your feet flat on the floor. The cuff of the blood pressure monitor will be placed directly against the skin of your upper arm at the level of your heart. It should be measured at least twice using the same arm. Certain conditions can cause a difference in blood pressure between your right and left arms. Certain factors can cause blood pressure readings to be lower or higher than normal for a short period of time: When your blood pressure is higher when you are in a health care provider's office than when you are at home, this is called white coat hypertension. Most people with this condition do not need medicines. When your blood pressure is higher at home than when you are in a health care provider's office, this is called masked hypertension. Most people with this condition may need medicines to control blood pressure. If you have a high blood pressure reading during one visit or you have normal blood pressure with other risk factors, you may be asked to: Return on a different day to have your blood pressure checked again. Monitor your blood pressure at home for 1 week or longer. If you are diagnosed with hypertension, you may have other blood or imaging tests to help your health care provider understand your overall risk for other conditions. How is this treated? This condition is treated by making  healthy lifestyle changes, such as eating healthy foods, exercising more, and reducing your alcohol intake. Your health care provider may prescribe medicine if lifestyle changes are not enough to get your blood pressure under control, and if: Your systolic blood pressure is above  130. Your diastolic blood pressure is above 80. Your personal target blood pressure may vary depending on your medical conditions, your age, and other factors. Follow these instructions at home: Eating and drinking  Eat a diet that is high in fiber and potassium, and low in sodium, added sugar, and fat. An example eating plan is called the DASH (Dietary Approaches to Stop Hypertension) diet. To eat this way: Eat plenty of fresh fruits and vegetables. Try to fill one half of your plate at each meal with fruits and vegetables. Eat whole grains, such as whole-wheat pasta, brown rice, or whole-grain bread. Fill about one fourth of your plate with whole grains. Eat or drink low-fat dairy products, such as skim milk or low-fat yogurt. Avoid fatty cuts of meat, processed or cured meats, and poultry with skin. Fill about one fourth of your plate with lean proteins, such as fish, chicken without skin, beans, eggs, or tofu. Avoid pre-made and processed foods. These tend to be higher in sodium, added sugar, and fat. Reduce your daily sodium intake. Most people with hypertension should eat less than 1,500 mg of sodium a day. Do not drink alcohol if: Your health care provider tells you not to drink. You are pregnant, may be pregnant, or are planning to become pregnant. If you drink alcohol: Limit how much you use to: 0-1 drink a day for women. 0-2 drinks a day for men. Be aware of how much alcohol is in your drink. In the [place] one drink equals one 12 oz bottle of beer (355 mL), one 5 oz glass of wine (148 mL), or one 1 oz glass of hard liquor (44 mL). Lifestyle  Work with your health care provider to maintain a healthy body weight or to lose weight. Ask what an ideal weight is for you. Get at least 30 minutes of exercise most days of the week. Activities may include walking, swimming, or biking. Include exercise to strengthen your muscles (resistance exercise), such as Pilates or lifting weights, as  part of your weekly exercise routine. Try to do these types of exercises for 30 minutes at least 3 days a week. Do not use any products that contain nicotine or tobacco, such as cigarettes, e-cigarettes, and chewing tobacco. If you need help quitting, ask your health care provider. Monitor your blood pressure at home as told by your health care provider. Keep all follow-up visits as told by your health care provider. This is important. Medicines Take over-the-counter and prescription medicines only as told by your health care provider. Follow directions carefully. Blood pressure medicines must be taken as prescribed. Do not skip doses of blood pressure medicine. Doing this puts you at risk for problems and can make the medicine less effective. Ask your health care provider about side effects or reactions to medicines that you should watch for. Contact a health care provider if you: Think you are having a reaction to a medicine you are taking. Have headaches that keep coming back (recurring). Feel dizzy. Have swelling in your ankles. Have trouble with your vision. Get help right away if you: Develop a severe headache or confusion. Have unusual weakness or numbness. Feel faint. Have severe pain in your chest or abdomen. Vomit repeatedly. Have trouble  breathing. Summary Hypertension is when the force of blood pumping through your arteries is too strong. If this condition is not controlled, it may put you at risk for serious complications. Your personal target blood pressure may vary depending on your medical conditions, your age, and other factors. For most people, a normal blood pressure is less than 120/80. Hypertension is treated with lifestyle changes, medicines, or a combination of both. Lifestyle changes include losing weight, eating a healthy, low-sodium diet, exercising more, and limiting alcohol. This information is not intended to replace advice given to you by your health care  provider. Make sure you discuss any questions you have with your health care provider. Document Revised: 08/16/2018 Document Reviewed: 08/16/2018 Elsevier Patient Education  2022 [employer].  These are the categories of obesity - your BMI is 32.61 which is considered Class 1   Obesity is frequently subdivided into categories: Class 1: BMI of 30 to < 35. Class 2: BMI of 35 to < 40. Class 3: BMI of 40 or higher. Class 3 obesity is sometimes categorized as severe obesity.

## 2022-03-02 LAB — HEMOGLOBIN A1C
Est. average glucose Bld gHb Est-mCnc: 126 mg/dL
Hgb A1c MFr Bld: 6 % — ABNORMAL HIGH (ref 4.8–5.6)

## 2022-03-02 LAB — LIPID PANEL
Chol/HDL Ratio: 4 ratio (ref 0.0–4.4)
Cholesterol, Total: 204 mg/dL — ABNORMAL HIGH (ref 100–199)
HDL: 51 mg/dL (ref >39)
LDL Chol Calc (NIH): 133 mg/dL — ABNORMAL HIGH (ref 0–99)
Triglycerides: 112 mg/dL (ref 0–149)
VLDL Cholesterol Cal: 20 mg/dL (ref 5–40)

## 2022-03-04 ENCOUNTER — Ambulatory Visit (INDEPENDENT_AMBULATORY_CARE_PROVIDER_SITE_OTHER): Payer: Medicare Other | Admitting: Adult Health

## 2022-03-04 ENCOUNTER — Encounter: Payer: Self-pay | Admitting: Nurse Practitioner

## 2022-03-04 ENCOUNTER — Other Ambulatory Visit: Payer: Self-pay

## 2022-03-04 ENCOUNTER — Encounter (INDEPENDENT_AMBULATORY_CARE_PROVIDER_SITE_OTHER): Payer: Self-pay | Admitting: Adult Health

## 2022-03-04 VITALS — BP 144/78 | HR 73 | Temp 98.4°F | Ht 64.0 in | Wt 188.0 lb

## 2022-03-04 DIAGNOSIS — E7849 Other hyperlipidemia: Secondary | ICD-10-CM | POA: Insufficient documentation

## 2022-03-04 DIAGNOSIS — Z6832 Body mass index (BMI) 32.0-32.9, adult: Secondary | ICD-10-CM | POA: Diagnosis not present

## 2022-03-04 DIAGNOSIS — E782 Mixed hyperlipidemia: Secondary | ICD-10-CM | POA: Insufficient documentation

## 2022-03-04 DIAGNOSIS — E785 Hyperlipidemia, unspecified: Secondary | ICD-10-CM | POA: Diagnosis not present

## 2022-03-04 DIAGNOSIS — E1169 Type 2 diabetes mellitus with other specified complication: Secondary | ICD-10-CM

## 2022-03-04 DIAGNOSIS — Z7984 Long term (current) use of oral hypoglycemic drugs: Secondary | ICD-10-CM

## 2022-03-04 DIAGNOSIS — E669 Obesity, unspecified: Secondary | ICD-10-CM | POA: Diagnosis not present

## 2022-03-04 MED ORDER — METFORMIN HCL ER 500 MG PO TB24: 500.0000 mg | ORAL_TABLET | Freq: Two times a day (BID) | ORAL | 0 refills | Status: DC

## 2022-03-04 NOTE — Progress Notes (Addendum)
? ? ? ?Chief Complaint:  ? ?OBESITY ?Hendel is here to discuss her progress with her obesity treatment plan along with follow-up of her obesity related diagnoses. Krystyn is on the Category 2 Plan with breakfast options and states she is following her eating plan approximately 85% of the time. Arisa states she is not exercising regularly at this time. ? ?Today's visit was #: 3 ?Starting weight: 204 lbs ?Starting date: 02/06/2021 ?Today's weight: 188 lbs ?Today's date: 03/04/2022 ?Total lbs lost to date: 16 lbs ?Total lbs lost since last in-office visit: 0 ? ?Interim History: ?Ms. Devera endorses the following intake: ? Breakfast - boiled eggs, apple. ?Lunch - 4 oz meat sandwich or pre-made salad with chicken from [place], [place]. ?Supper - widely varies, however she tries to include leaner protein options with vegetables. ? ?Subjective:  ? ?1. Type 2 diabetes mellitus without complication, without long-term current use of insulin ([place]) ?She is on metformin 500 mg BID with meals. ?She denies GI upset. ? ?2. Hyperlipidemia associated with type 2 diabetes mellitus ([place]) ?On 03/01/2022, lipid panel - total/LDL both well above goal. ?She stopped rosuvastatin- her PCP is unaware that she is not on statin therapy. ?Discussed at length the risks associated with uncontrolled HLD. ?She denies acute cardiac sx's. ?ASCVD Risk Stratification 26.2%- well above goal. ? ?Assessment/Plan:  ? ?1. Type 2 diabetes mellitus without complication, without long-term current use of insulin ([place]) ?Refill metformin 500 mg BID with meals. ? ?- Refill metFORMIN (GLUCOPHAGE-XR) 500 MG 24 hr tablet; Take 1 tablet (500 mg total) by mouth 2 (two) times daily.  Dispense: 120 tablet; Refill: 0 ? ?2. Hyperlipidemia associated with type 2 diabetes mellitus ([place]) ?Follow-up with PCP. ?If acute cardiac sx's develop- seek immediate medical assistance. ?Pt verbalized understanding/agreement. ? ?3. Obesity with current BMI 32.4 ? ?Ephrata is  currently in the action stage of change. As such, her goal is to continue with weight loss efforts. She has agreed to the Category 2 Plan with breakfast options.  ? ?Ask for double protein when purchasing salad. ? ?Exercise goals:  Movement at clinic. 2 days - [place]. ? ?Behavioral modification strategies: increasing lean protein intake, decreasing simple carbohydrates, meal planning and cooking strategies, keeping healthy foods in the home, and planning for success. ? ?Gracelynn has agreed to follow-up with our clinic in 4-5 weeks. She was informed of the importance of frequent follow-up visits to maximize her success with intensive lifestyle modifications for her multiple health conditions.  ? ?Objective:  ? ?Blood pressure (!) 144/78, pulse 73, temperature 98.4 ?F (36.9 ?C), height '5\' 4"'$  (1.626 m), weight 188 lb (85.3 kg), SpO2 100 %. ?Body mass index is 32.27 kg/m?. ? ?General: Cooperative, alert, well developed, in no acute distress. ?HEENT: Conjunctivae and lids unremarkable. ?Cardiovascular: Regular rhythm.  ?Lungs: Normal work of breathing. ?Neurologic: No focal deficits.  ? ?Lab Results  ?Component Value Date  ? CREATININE 0.66 10/28/2021  ? BUN 19 10/28/2021  ? NA 137 10/28/2021  ? K 4.5 10/28/2021  ? CL 97 10/28/2021  ? CO2 25 10/28/2021  ? ?Lab Results  ?Component Value Date  ? ALT 19 10/28/2021  ? AST 16 10/28/2021  ? ALKPHOS 86 10/28/2021  ? BILITOT 0.5 10/28/2021  ? ?Lab Results  ?Component Value Date  ? HGBA1C 6.0 (H) 03/01/2022  ? HGBA1C 6.0 (H) 10/28/2021  ? HGBA1C 5.9 (H) 07/02/2021  ? HGBA1C 5.6 02/18/2021  ? HGBA1C 6.1 (H) 01/27/2021  ? ?Lab Results  ?Component Value  Date  ? INSULIN 31.4 (H) 01/27/2021  ? ?Lab Results  ?Component Value Date  ? TSH 0.550 02/04/2022  ? ?Lab Results  ?Component Value Date  ? CHOL 204 (H) 03/01/2022  ? HDL 51 03/01/2022  ? LDLCALC 133 (H) 03/01/2022  ? TRIG 112 03/01/2022  ? CHOLHDL 4.0 03/01/2022  ? ?Lab Results  ?Component Value Date  ?  VD25OH 52.0 02/04/2022  ? VD25OH 61.9 07/02/2021  ? VD25OH 55.1 02/18/2021  ? ?Lab Results  ?Component Value Date  ? WBC 5.0 10/28/2021  ? HGB 11.6 10/28/2021  ? HCT 35.9 10/28/2021  ? MCV 87 10/28/2021  ? PLT 418 10/28/2021  ? ?Obesity Behavioral Intervention:  ? ?Approximately 15 minutes were spent on the discussion below. ? ?ASK: ?We discussed the diagnosis of obesity with [name] today and Teffany agreed to give [place] permission to discuss obesity behavioral modification therapy today. ? ?ASSESS: ?Nneka has the diagnosis of obesity and her BMI today is 32.4. Chynna is in the action stage of change.  ? ?ADVISE: ?Zeidy was educated on the multiple health risks of obesity as well as the benefit of weight loss to improve her health. She was advised of the need for long term treatment and the importance of lifestyle modifications to improve her current health and to decrease her risk of future health problems. ? ?AGREE: ?Multiple dietary modification options and treatment options were discussed and Lasandra agreed to follow the recommendations documented in the above note. ? ?ARRANGE: ?Zoie was educated on the importance of frequent visits to treat obesity as outlined per CMS and USPSTF guidelines and agreed to schedule her next follow up appointment today. ? ?Attestation Statements:  ? ?Reviewed by clinician on day of visit: allergies, medications, problem list, medical history, surgical history, family history, social history, and previous encounter notes. ? ?I, Water quality scientist, CMA, am acting as Location manager for Mina Marble, NP. ? ?I have reviewed the above documentation for accuracy and completeness, and I agree with the above. -  Rylen Swindler d. Marishka Rentfrow, NP-C ?

## 2022-03-08 ENCOUNTER — Encounter (INDEPENDENT_AMBULATORY_CARE_PROVIDER_SITE_OTHER): Payer: Self-pay | Admitting: Adult Health

## 2022-03-08 NOTE — Telephone Encounter (Signed)
Please advise 

## 2022-03-12 ENCOUNTER — Other Ambulatory Visit (INDEPENDENT_AMBULATORY_CARE_PROVIDER_SITE_OTHER): Payer: Self-pay | Admitting: Physician Assistant

## 2022-03-12 DIAGNOSIS — E119 Type 2 diabetes mellitus without complications: Secondary | ICD-10-CM

## 2022-03-19 DIAGNOSIS — D122 Benign neoplasm of ascending colon: Secondary | ICD-10-CM | POA: Diagnosis not present

## 2022-03-19 DIAGNOSIS — D125 Benign neoplasm of sigmoid colon: Secondary | ICD-10-CM | POA: Diagnosis not present

## 2022-03-19 DIAGNOSIS — Z1211 Encounter for screening for malignant neoplasm of colon: Secondary | ICD-10-CM | POA: Diagnosis not present

## 2022-03-19 DIAGNOSIS — Z86010 Personal history of colonic polyps: Secondary | ICD-10-CM | POA: Diagnosis not present

## 2022-03-19 LAB — HM COLONOSCOPY

## 2022-03-24 ENCOUNTER — Encounter: Payer: Self-pay | Admitting: Nurse Practitioner

## 2022-04-06 ENCOUNTER — Encounter: Payer: Self-pay | Admitting: Nurse Practitioner

## 2022-04-08 ENCOUNTER — Encounter (INDEPENDENT_AMBULATORY_CARE_PROVIDER_SITE_OTHER): Payer: Self-pay | Admitting: Adult Health

## 2022-04-08 ENCOUNTER — Ambulatory Visit (INDEPENDENT_AMBULATORY_CARE_PROVIDER_SITE_OTHER): Payer: Medicare Other | Admitting: Adult Health

## 2022-04-08 VITALS — BP 153/91 | HR 70 | Temp 97.9°F | Ht 64.0 in | Wt 185.0 lb

## 2022-04-08 DIAGNOSIS — E119 Type 2 diabetes mellitus without complications: Secondary | ICD-10-CM

## 2022-04-08 DIAGNOSIS — E669 Obesity, unspecified: Secondary | ICD-10-CM | POA: Insufficient documentation

## 2022-04-08 DIAGNOSIS — E785 Hyperlipidemia, unspecified: Secondary | ICD-10-CM

## 2022-04-08 DIAGNOSIS — Z6834 Body mass index (BMI) 34.0-34.9, adult: Secondary | ICD-10-CM | POA: Insufficient documentation

## 2022-04-08 DIAGNOSIS — E1169 Type 2 diabetes mellitus with other specified complication: Secondary | ICD-10-CM | POA: Diagnosis not present

## 2022-04-09 ENCOUNTER — Encounter: Payer: Self-pay | Admitting: Nurse Practitioner

## 2022-04-12 ENCOUNTER — Other Ambulatory Visit: Payer: Self-pay | Admitting: Nurse Practitioner

## 2022-04-12 DIAGNOSIS — R053 Chronic cough: Secondary | ICD-10-CM

## 2022-04-12 DIAGNOSIS — I10 Essential (primary) hypertension: Secondary | ICD-10-CM

## 2022-04-12 MED ORDER — HYDROCODONE BIT-HOMATROP MBR 5-1.5 MG/5ML PO SOLN: 5.0000 mL | Freq: Four times a day (QID) | ORAL | 0 refills | Status: DC | PRN

## 2022-04-21 NOTE — Progress Notes (Signed)
? ? ? ?Chief Complaint:  ? ?OBESITY ?Deborah [name] is here to discuss her progress with her obesity treatment plan along with follow-up of her obesity related diagnoses. Deborah [name] is on the Category 2 Plan with breakfast options and states she is following her eating plan approximately 85% of the time. Deborah [name] states she is walking 10-15 minutes 2 times per week. ? ?Today's visit was #: 18 ?Starting weight: 204 lbs ?Starting date: 02/06/2021 ?Today's weight: 185 lbs ?Today's date: 04/08/22 ?Total lbs lost to date: 41 ?Total lbs lost since last in-office visit: 3 ? ?Interim History:  ?The first week of May, Deborah [name] will decrease work from 3 x 12 hr shifrs/week, to PRN. 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  ?She will be able to increase regular walking after changes in work schedule. ? ?Subjective:  ? ?1. Hyperlipidemia associated with type 2 diabetes mellitus ([place]) ?Deborah [name] chooses to remain off Statin therapy, previously on Crestor 5 mg 3 times a week.  ?She has communicated this to her PCP.  ?Her PCP as recommended over the counter Red Yeast Rice.  ?Deborah [name] declines referral to lipid clinic at this time. ? ?2. Type 2 diabetes mellitus without complication, without long-term current use of insulin ([place]) ?Deborah [name]'s fasting glucose is 96-107, she denies sx's of hypoglycemia.  ?She is currently taking Metformin XR three times a day with meals. ? ?Assessment/Plan:  ? ?1. Hyperlipidemia associated with type 2 diabetes mellitus ([place]) ?Deborah [name] will continue to decrease saturated fat and regular walking. ? ?2. Type 2 diabetes mellitus without complication, without long-term current use of insulin ([employer]) ?Deborah [name] will continue on Metformin therapy. ?Monitor home BG. ? ?3. Obesity with current BMI 31.8 ?Deborah [name] is currently in the action stage of change. As such, her goal is to continue with weight loss efforts. She has agreed to the Category 2 Plan.  ? ?Exercise goals: As is. ? ?Behavioral modification strategies:  increasing lean protein intake, decreasing simple carbohydrates, meal planning and cooking strategies, keeping healthy foods in the home, and planning for success. ? ?Deborah [name] has agreed to follow-up with our clinic in 4 weeks. She was informed of the importance of frequent follow-up visits to maximize her success with intensive lifestyle modifications for her multiple health conditions.  ? ?Objective:  ? ?Blood pressure (!) 153/91, pulse 70, temperature 97.9 ?F (36.6 ?C), height '5\' 4"'$  (1.626 m), weight 185 lb (83.9 kg), SpO2 99 %. ?Body mass index is 31.76 kg/m?. ? ?General: Cooperative, alert, well developed, in no acute distress. ?HEENT: Conjunctivae and lids unremarkable. ?Cardiovascular: Regular rhythm.  ?Lungs: Normal work of breathing. ?Neurologic: No focal deficits.  ? ?Lab Results  ?Component Value Date  ? CREATININE 0.66 10/28/2021  ? BUN 19 10/28/2021  ? NA 137 10/28/2021  ? K 4.5 10/28/2021  ? CL 97 10/28/2021  ? CO2 25 10/28/2021  ? ?Lab Results  ?Component Value Date  ? ALT 19 10/28/2021  ? AST 16 10/28/2021  ? ALKPHOS 86 10/28/2021  ? BILITOT 0.5 10/28/2021  ? ?Lab Results  ?Component Value Date  ? HGBA1C 6.0 (H) 03/01/2022  ? HGBA1C 6.0 (H) 10/28/2021  ? HGBA1C 5.9 (H) 07/02/2021  ? HGBA1C 5.6 02/18/2021  ? HGBA1C 6.1 (H) 01/27/2021  ? ?Lab Results  ?Component Value Date  ? INSULIN 31.4 (H) 01/27/2021  ? ?Lab Results  ?Component Value Date  ? TSH 0.550 02/04/2022  ? ?Lab Results  ?Component Value Date  ? CHOL 204 (H) 03/01/2022  ? HDL 51 03/01/2022  ?  LDLCALC 133 (H) 03/01/2022  ? TRIG 112 03/01/2022  ? CHOLHDL 4.0 03/01/2022  ? ?Lab Results  ?Component Value Date  ? VD25OH 52.0 02/04/2022  ? VD25OH 61.9 07/02/2021  ? VD25OH 55.1 02/18/2021  ? ?Lab Results  ?Component Value Date  ? WBC 5.0 10/28/2021  ? HGB 11.6 10/28/2021  ? HCT 35.9 10/28/2021  ? MCV 87 10/28/2021  ? PLT 418 10/28/2021  ? ?No results found for: IRON, TIBC, FERRITIN ? ?Attestation Statements:  ? ?Reviewed by clinician on day of  visit: allergies, medications, problem list, medical history, surgical history, family history, social history, and previous encounter notes. ? ?I, Deborah [name], RMA, am acting as transcriptionist for Mina Marble, NP. ? ?I have reviewed the above documentation for accuracy and completeness, and I agree with the above. -  Deborah [name] d. Deborah Tonkinson, NP-C ?

## 2022-04-28 LAB — HM DIABETES EYE EXAM

## 2022-05-06 ENCOUNTER — Other Ambulatory Visit: Payer: Self-pay | Admitting: Nurse Practitioner

## 2022-05-06 ENCOUNTER — Ambulatory Visit (INDEPENDENT_AMBULATORY_CARE_PROVIDER_SITE_OTHER): Payer: Medicare Other | Admitting: Adult Health

## 2022-05-12 ENCOUNTER — Encounter (INDEPENDENT_AMBULATORY_CARE_PROVIDER_SITE_OTHER): Payer: Self-pay | Admitting: Family Medicine

## 2022-05-12 ENCOUNTER — Ambulatory Visit (INDEPENDENT_AMBULATORY_CARE_PROVIDER_SITE_OTHER): Payer: Medicare Other | Admitting: Family Medicine

## 2022-05-12 VITALS — BP 160/75 | HR 79 | Temp 97.6°F | Ht 64.0 in | Wt 185.0 lb

## 2022-05-12 DIAGNOSIS — E1159 Type 2 diabetes mellitus with other circulatory complications: Secondary | ICD-10-CM | POA: Diagnosis not present

## 2022-05-12 DIAGNOSIS — I152 Hypertension secondary to endocrine disorders: Secondary | ICD-10-CM

## 2022-05-12 DIAGNOSIS — E1165 Type 2 diabetes mellitus with hyperglycemia: Secondary | ICD-10-CM | POA: Diagnosis not present

## 2022-05-12 DIAGNOSIS — E669 Obesity, unspecified: Secondary | ICD-10-CM

## 2022-05-12 DIAGNOSIS — Z6831 Body mass index (BMI) 31.0-31.9, adult: Secondary | ICD-10-CM

## 2022-05-12 DIAGNOSIS — E785 Hyperlipidemia, unspecified: Secondary | ICD-10-CM

## 2022-05-12 DIAGNOSIS — E1169 Type 2 diabetes mellitus with other specified complication: Secondary | ICD-10-CM

## 2022-05-12 DIAGNOSIS — Z7984 Long term (current) use of oral hypoglycemic drugs: Secondary | ICD-10-CM

## 2022-05-12 NOTE — Telephone Encounter (Signed)
FYI

## 2022-05-21 NOTE — Progress Notes (Signed)
Chief Complaint:   OBESITY Deborah [name] is here to discuss her progress with her obesity treatment plan along with follow-up of her obesity related diagnoses. Deborah [name] is on the Category 2 Plan and states she is following her eating plan approximately 85% of the time. Deborah [name] states she not exercising this week.  Today's visit was #: 4 Starting weight: 204 lbs Starting date: 02/06/2021 Today's weight: 181 lbs Today's date: 05/12/2022 Total lbs lost to date: 23 Total lbs lost since last in-office visit: 4  Interim History: Deborah [name] has been trying to choose healthier options over the course of the day. She is trying to increase her protein intake and fruits and vegetables. Deborah [name] is staying away from fried foods consistently. She eats some microwavable meals with protein options.  Subjective:   1. Hypertension associated with diabetes ([place]) Deborah [name] has elevated blood pressure today. She is on HCTZ, aldactone, and hydralazine. Deborah [name] denies chest pain, chest pressure, or headaches.  2. Type 2 diabetes mellitus with hyperglycemia, without long-term current use of insulin (HCC) Deborah [name] is on metformin currently. No GI side effects were verbalized.  3. Hyperlipidemia associated with type 2 diabetes mellitus ([place]) Deborah [name]'s LDL was 133, HDL was 51, triglycerides were 112, and her total cholesterol was 204. She is on Crestor, but has had some muscle cramps.  Assessment/Plan:   1. Hypertension associated with diabetes ([place]) Deborah [name] would like to discuss further management with her PCP. She may benefit from switching HCTZ to chlorthalidone. She will follow up as directed.  2. Type 2 diabetes mellitus with hyperglycemia, without long-term current use of insulin (HCC) Deborah [name] agrees to continue her metformin and to follow up as directed.  3. Hyperlipidemia associated with type 2 diabetes mellitus ([place]) Deborah [name] would like to discuss possibly restarting an alternative statin with her  PCP.  4. Obesity with current BMI is 31.2 Deborah [name] is currently in the action stage of change. As such, her goal is to continue with weight loss efforts. She has agreed to keeping a food journal and adhering to recommended goals of 300 to 400 calories and 25+ grams of protein for lunch and practicing portion control and making smarter food choices, such as increasing vegetables and decreasing simple carbohydrates. Deborah [name] agrees to occasionally weigh protein in the evening. Dining out handout was given.  Exercise goals:  As is.  Behavioral modification strategies: increasing lean protein intake, meal planning and cooking strategies, and keeping healthy foods in the home.  Deborah [name] has agreed to follow-up with our clinic in 4 weeks. She was informed of the importance of frequent follow-up visits to maximize her success with intensive lifestyle modifications for her multiple health conditions.   Objective:   Blood pressure (!) 160/75, pulse 79, temperature 97.6 F (36.4 C), height '5\' 4"'$  (1.626 m), weight 185 lb (83.9 kg), SpO2 97 %. Body mass index is 31.76 kg/m.  General: Cooperative, alert, well developed, in no acute distress. HEENT: Conjunctivae and lids unremarkable. Cardiovascular: Regular rhythm.  Lungs: Normal work of breathing. Neurologic: No focal deficits.   Lab Results  Component Value Date   CREATININE 0.66 10/28/2021   BUN 19 10/28/2021   NA 137 10/28/2021   K 4.5 10/28/2021   CL 97 10/28/2021   CO2 25 10/28/2021   Lab Results  Component Value Date   ALT 19 10/28/2021   AST 16 10/28/2021   ALKPHOS 86 10/28/2021   BILITOT 0.5 10/28/2021   Lab Results  Component Value Date   HGBA1C 6.0 (H) 03/01/2022  HGBA1C 6.0 (H) 10/28/2021   HGBA1C 5.9 (H) 07/02/2021   HGBA1C 5.6 02/18/2021   HGBA1C 6.1 (H) 01/27/2021   Lab Results  Component Value Date   INSULIN 31.4 (H) 01/27/2021   Lab Results  Component Value Date   TSH 0.550 02/04/2022   Lab Results   Component Value Date   CHOL 204 (H) 03/01/2022   HDL 51 03/01/2022   LDLCALC 133 (H) 03/01/2022   TRIG 112 03/01/2022   CHOLHDL 4.0 03/01/2022   Lab Results  Component Value Date   VD25OH 52.0 02/04/2022   VD25OH 61.9 07/02/2021   VD25OH 55.1 02/18/2021   Lab Results  Component Value Date   WBC 5.0 10/28/2021   HGB 11.6 10/28/2021   HCT 35.9 10/28/2021   MCV 87 10/28/2021   PLT 418 10/28/2021   No results found for: IRON, TIBC, FERRITIN  Obesity Behavioral Intervention:   Approximately 15 minutes were spent on the discussion below.  ASK: We discussed the diagnosis of obesity with Deborah [name] today and Deborah [name] agreed to give [place] permission to discuss obesity behavioral modification therapy today.  ASSESS: Deborah [name] has the diagnosis of obesity and her BMI today is 31.2. Deborah [name] is in the action stage of change.   ADVISE: Deborah [name] was educated on the multiple health risks of obesity as well as the benefit of weight loss to improve her health. She was advised of the need for long term treatment and the importance of lifestyle modifications to improve her current health and to decrease her risk of future health problems.  AGREE: Multiple dietary modification options and treatment options were discussed and Deborah [name] agreed to follow the recommendations documented in the above note.  ARRANGE: Deborah [name] was educated on the importance of frequent visits to treat obesity as outlined per CMS and USPSTF guidelines and agreed to schedule her next follow up appointment today.  Attestation Statements:   Reviewed by clinician on day of visit: allergies, medications, problem list, medical history, surgical history, family history, social history, and previous encounter notes.  Time spent on visit including pre-visit chart review and post-visit care and charting was 27 minutes.   IMarcille Blanco, CMA, am acting as transcriptionist for Coralie Common, MD  I have reviewed the above  documentation for accuracy and completeness, and I agree with the above. - Coralie Common, MD

## 2022-06-02 ENCOUNTER — Encounter (INDEPENDENT_AMBULATORY_CARE_PROVIDER_SITE_OTHER): Payer: Self-pay | Admitting: Family Medicine

## 2022-06-07 ENCOUNTER — Encounter (INDEPENDENT_AMBULATORY_CARE_PROVIDER_SITE_OTHER): Payer: Self-pay | Admitting: Family Medicine

## 2022-06-07 ENCOUNTER — Ambulatory Visit (INDEPENDENT_AMBULATORY_CARE_PROVIDER_SITE_OTHER): Payer: Medicare Other | Admitting: Family Medicine

## 2022-06-07 VITALS — BP 143/83 | HR 77 | Temp 97.9°F | Ht 64.0 in | Wt 183.8 lb

## 2022-06-07 DIAGNOSIS — E1169 Type 2 diabetes mellitus with other specified complication: Secondary | ICD-10-CM | POA: Diagnosis not present

## 2022-06-07 DIAGNOSIS — E1159 Type 2 diabetes mellitus with other circulatory complications: Secondary | ICD-10-CM | POA: Diagnosis not present

## 2022-06-07 DIAGNOSIS — I152 Hypertension secondary to endocrine disorders: Secondary | ICD-10-CM | POA: Diagnosis not present

## 2022-06-07 DIAGNOSIS — E785 Hyperlipidemia, unspecified: Secondary | ICD-10-CM

## 2022-06-07 DIAGNOSIS — Z6831 Body mass index (BMI) 31.0-31.9, adult: Secondary | ICD-10-CM

## 2022-06-07 DIAGNOSIS — E669 Obesity, unspecified: Secondary | ICD-10-CM

## 2022-06-07 DIAGNOSIS — E119 Type 2 diabetes mellitus without complications: Secondary | ICD-10-CM

## 2022-06-07 DIAGNOSIS — Z7984 Long term (current) use of oral hypoglycemic drugs: Secondary | ICD-10-CM | POA: Diagnosis not present

## 2022-06-07 MED ORDER — METFORMIN HCL ER 500 MG PO TB24: 500.0000 mg | ORAL_TABLET | Freq: Two times a day (BID) | ORAL | 0 refills | Status: DC

## 2022-06-09 ENCOUNTER — Ambulatory Visit (INDEPENDENT_AMBULATORY_CARE_PROVIDER_SITE_OTHER): Payer: Medicare Other | Admitting: Family Medicine

## 2022-06-12 NOTE — Progress Notes (Signed)
Chief Complaint:   OBESITY Rethel is here to discuss her progress with her obesity treatment plan along with follow-up of her obesity related diagnoses. Silje is on the Category 2 Plan and states she is following her eating plan approximately 85% of the time. Jaszmin states she is doing Silver Sneakers 45 minutes 3 times per week.  Today's visit was #: 20 Starting weight: 204 lbs Starting date: 02/06/2021 Today's weight: 183 lbs Today's date: 06/07/2022 Total lbs lost to date: 21 Total lbs lost since last in-office visit: -2  Interim History: This is Autumm's first OV with me. She is a pt of Dr. Francena Hanly. Cambreigh Crisafi is here for a follow up office visit. We reviewed her meal plan and all questions were answered. Patient's food recall appears to be accurate and consistent with what is on plan when she is following it. When eating on plan, her hunger and cravings are well controlled. Pt only drinks 36 oz of water a day. She has no history of CHF or renal disease.   Subjective:   1. Type 2 diabetes mellitus without complication, without long-term current use of insulin (HCC) Discussed labs with patient today. Pt's fasting blood sugars run 98-115. She is checking her sugars daily. She denies lows or highs. Her A1c was 6.0 approximately 3 months.  2. Hypertension associated with diabetes ([facility]) Discussed labs with patient today (CMP 2.5 months ago). Pt's BP at home runs 120-140/70-80. She is asymptomatic without concerns on aldactone, HCTZ, and hydralazine  3. Hyperlipidemia associated with type 2 diabetes mellitus ([facility]) Pt was tired on Crestor 5 mg and was taking it M, W, F. She was on it for several years, but a couple of years ago, she had increasing muscle aches and stopped taking it.  Assessment/Plan:  No orders of the defined types were placed in this encounter.   Medications Discontinued During This Encounter  Medication Reason   metFORMIN (GLUCOPHAGE-XR) 500  MG 24 hr tablet Reorder     Meds ordered this encounter  Medications   metFORMIN (GLUCOPHAGE-XR) 500 MG 24 hr tablet    Sig: Take 1 tablet (500 mg total) by mouth 2 (two) times daily.    Dispense:  120 tablet    Refill:  0     1. Type 2 diabetes mellitus without complication, without long-term current use of insulin (HCC) Good blood sugar control is important to decrease the likelihood of diabetic complications such as nephropathy, neuropathy, limb loss, blindness, coronary artery disease, and death. Intensive lifestyle modification including diet, exercise and weight loss are the first line of treatment for diabetes.   Refill- metFORMIN (GLUCOPHAGE-XR) 500 MG 24 hr tablet; Take 1 tablet (500 mg total) by mouth 2 (two) times daily.  Dispense: 120 tablet; Refill: 0  2. Hypertension associated with diabetes (HCC) BP much better controlled than last OV at 160/75. Veva is working on healthy weight loss and exercise to improve blood pressure control. We will watch for signs of hypotension as she continues her lifestyle modifications.  3. Hyperlipidemia associated with type 2 diabetes mellitus ([facility]CC) LDL not at goal at 133 about 3 months ago. Cardiovascular risk and specific lipid/LDL goals reviewed.  We discussed several lifestyle modifications today and Pragathi will continue to work on diet, exercise and weight loss efforts. Orders and follow up as documented in patient record. I encouraged pt to increase water intake and possibly  restart Crestor at 2.5 mg M, W, F. Pt will discuss this with  her PCP.  Counseling Intensive lifestyle modifications are the first line treatment for this issue. Dietary changes: Increase soluble fiber. Decrease simple carbohydrates. Exercise changes: Moderate to vigorous-intensity aerobic activity 150 minutes per week if tolerated. Lipid-lowering medications: see documented in medical record.  4. Obesity with current BMI 31.5 Suprena is currently in the action  stage of change. As such, her goal is to continue with weight loss efforts. She has agreed to the Category 2 Plan.   Increase water intake to 50-60 oz per day.  Exercise goals:  As is  Behavioral modification strategies: increasing lean protein intake, decreasing simple carbohydrates, and planning for success.  Elaena has agreed to follow-up with our clinic in 4 weeks with Dr. Dalbert Garnet. She was informed of the importance of frequent follow-up visits to maximize her success with intensive lifestyle modifications for her multiple health conditions.   Objective:   Blood pressure (!) 143/83, pulse 77, temperature 97.9 F (36.6 C), height 5\' 4"  (1.626 m), weight 183 lb 12.8 oz (83.4 kg), SpO2 99 %. Body mass index is 31.55 kg/m.  General: Cooperative, alert, well developed, in no acute distress. HEENT: Conjunctivae and lids unremarkable. Cardiovascular: Regular rhythm.  Lungs: Normal work of breathing. Neurologic: No focal deficits.   Lab Results  Component Value Date   CREATININE 0.66 10/28/2021   BUN 19 10/28/2021   NA 137 10/28/2021   K 4.5 10/28/2021   CL 97 10/28/2021   CO2 25 10/28/2021   Lab Results  Component Value Date   ALT 19 10/28/2021   AST 16 10/28/2021   ALKPHOS 86 10/28/2021   BILITOT 0.5 10/28/2021   Lab Results  Component Value Date   HGBA1C 6.0 (H) 03/01/2022   HGBA1C 6.0 (H) 10/28/2021   HGBA1C 5.9 (H) 07/02/2021   HGBA1C 5.6 02/18/2021   HGBA1C 6.1 (H) 01/27/2021   Lab Results  Component Value Date   INSULIN 31.4 (H) 01/27/2021   Lab Results  Component Value Date   TSH 0.550 02/04/2022   Lab Results  Component Value Date   CHOL 204 (H) 03/01/2022   HDL 51 03/01/2022   LDLCALC 133 (H) 03/01/2022   TRIG 112 03/01/2022   CHOLHDL 4.0 03/01/2022   Lab Results  Component Value Date   VD25OH 52.0 02/04/2022   VD25OH 61.9 07/02/2021   VD25OH 55.1 02/18/2021   Lab Results  Component Value Date   WBC 5.0 10/28/2021   HGB 11.6 10/28/2021    HCT 35.9 10/28/2021   MCV 87 10/28/2021   PLT 418 10/28/2021   No results found for: "IRON", "TIBC", "FERRITIN"  Obesity Behavioral Intervention:   Approximately 15 minutes were spent on the discussion below.  ASK: We discussed the diagnosis of obesity with Samara Deist today and Hien agreed to give [place] permission to discuss obesity behavioral modification therapy today.  ASSESS: Akaylah has the diagnosis of obesity and her BMI today is 31.5. Moses is in the action stage of change.   ADVISE: Aarika was educated on the multiple health risks of obesity as well as the benefit of weight loss to improve her health. She was advised of the need for long term treatment and the importance of lifestyle modifications to improve her current health and to decrease her risk of future health problems.  AGREE: Multiple dietary modification options and treatment options were discussed and Maayan agreed to follow the recommendations documented in the above note.  ARRANGE: Umeka was educated on the importance of frequent visits to treat obesity as outlined  per CMS and USPSTF guidelines and agreed to schedule her next follow up appointment today.  Attestation Statements:   Reviewed by clinician on day of visit: allergies, medications, problem list, medical history, surgical history, family history, social history, and previous encounter notes.  I, Kyung Rudd, BS, CMA, am acting as transcriptionist for [employer], DO.  I have reviewed the above documentation for accuracy and completeness, and I agree with the above. -   I have reviewed the above documentation for accuracy and completeness, and I agree with the above. Carlye Grippe, D.O.  The 21st Century Cures Act was signed into law in 2016 which includes the topic of electronic health records.  This provides immediate access to information in MyChart.  This includes consultation notes, operative notes, office notes, lab results and  pathology reports.  If you have any questions about what you read please let us know at your next visit so we can discuss your concerns and take corrective action if need be.  We are right here with you.

## 2022-06-21 ENCOUNTER — Other Ambulatory Visit: Payer: Self-pay | Admitting: Cardiovascular Disease

## 2022-06-21 DIAGNOSIS — I10 Essential (primary) hypertension: Secondary | ICD-10-CM

## 2022-06-23 NOTE — Telephone Encounter (Signed)
Rx(s) sent to pharmacy electronically.  

## 2022-06-24 ENCOUNTER — Encounter: Payer: Self-pay | Admitting: Nurse Practitioner

## 2022-06-28 ENCOUNTER — Encounter: Payer: Self-pay | Admitting: Nurse Practitioner

## 2022-06-29 ENCOUNTER — Other Ambulatory Visit: Payer: Self-pay | Admitting: Nurse Practitioner

## 2022-06-29 DIAGNOSIS — R053 Chronic cough: Secondary | ICD-10-CM

## 2022-06-29 MED ORDER — HYDROCODONE BIT-HOMATROP MBR 5-1.5 MG/5ML PO SOLN: 5.0000 mL | Freq: Four times a day (QID) | ORAL | 0 refills | Status: DC | PRN

## 2022-07-01 ENCOUNTER — Ambulatory Visit (INDEPENDENT_AMBULATORY_CARE_PROVIDER_SITE_OTHER): Payer: Medicare Other | Admitting: Nurse Practitioner

## 2022-07-01 ENCOUNTER — Encounter: Payer: Self-pay | Admitting: Nurse Practitioner

## 2022-07-01 VITALS — BP 130/68 | HR 87 | Temp 98.2°F | Ht 64.0 in | Wt 184.8 lb

## 2022-07-01 DIAGNOSIS — E782 Mixed hyperlipidemia: Secondary | ICD-10-CM | POA: Diagnosis not present

## 2022-07-01 DIAGNOSIS — E785 Hyperlipidemia, unspecified: Secondary | ICD-10-CM

## 2022-07-01 DIAGNOSIS — R7303 Prediabetes: Secondary | ICD-10-CM | POA: Diagnosis not present

## 2022-07-01 DIAGNOSIS — E1169 Type 2 diabetes mellitus with other specified complication: Secondary | ICD-10-CM

## 2022-07-01 DIAGNOSIS — E039 Hypothyroidism, unspecified: Secondary | ICD-10-CM | POA: Diagnosis not present

## 2022-07-01 DIAGNOSIS — I700 Atherosclerosis of aorta: Secondary | ICD-10-CM | POA: Diagnosis not present

## 2022-07-01 DIAGNOSIS — E6609 Other obesity due to excess calories: Secondary | ICD-10-CM

## 2022-07-01 DIAGNOSIS — I10 Essential (primary) hypertension: Secondary | ICD-10-CM | POA: Diagnosis not present

## 2022-07-01 DIAGNOSIS — R195 Other fecal abnormalities: Secondary | ICD-10-CM

## 2022-07-01 DIAGNOSIS — Z23 Encounter for immunization: Secondary | ICD-10-CM | POA: Diagnosis not present

## 2022-07-01 DIAGNOSIS — Z6831 Body mass index (BMI) 31.0-31.9, adult: Secondary | ICD-10-CM

## 2022-07-01 NOTE — Progress Notes (Signed)
Barnet Glasgow Martin,acting as a Education administrator for Minette Brine, FNP.,have documented all relevant documentation on the behalf of Minette Brine, FNP,as directed by  Minette Brine, FNP while in the presence of Minette Brine, [place].    Subjective:     Patient ID: Deborah [name] , female    DOB: 1951/09/10 , 71 y.o.   MRN: [id number]   Chief Complaint  Patient presents with   Diabetes   Hypertension    HPI  Patient is here today for a bp and diabetes check.   Diabetes She presents for her follow-up diabetic visit. Diabetes type: prediabetes. Her disease course has been stable. There are no hypoglycemic associated symptoms. Pertinent negatives for hypoglycemia include no dizziness or headaches. There are no diabetic associated symptoms. Pertinent negatives for diabetes include no blurred vision, no chest pain, no fatigue, no polydipsia, no polyphagia and no polyuria. There are no hypoglycemic complications. Symptoms are stable. There are no diabetic complications. Risk factors for coronary artery disease include diabetes mellitus, obesity and sedentary lifestyle. She is compliant with treatment most of the time. Her weight is stable. She is following a generally healthy (Currently on a diet from healthy weight and wellness - addendum) diet. When asked about meal planning, she reported none. She has had a previous visit with a dietitian (Addendum - healthy weight and wellness). She rarely participates in exercise. There is no change in her home blood glucose trend. An ACE inhibitor/angiotensin II receptor blocker is being taken. She does not see a podiatrist.Eye exam is current (10/22/2020).  Hypertension This is a chronic problem. The current episode started more than 1 year ago. The problem has been gradually improving since onset. The problem is controlled. Pertinent negatives include no blurred vision, chest pain, headaches, malaise/fatigue, palpitations or peripheral edema. There are no associated  agents to hypertension. Risk factors for coronary artery disease include dyslipidemia, diabetes mellitus, obesity and sedentary lifestyle. Past treatments include diuretics. The current treatment provides moderate improvement. Compliance problems include exercise.  There is no history of angina. There is no history of chronic renal disease.     Past Medical History:  Diagnosis Date   Back pain    Death of child 60   Due to cord strangulation.   Diabetes mellitus without complication (HCC)    Hyperlipidemia    Hypertension    Hypothyroidism    Joint pain    Osteoarthritis    Other fatigue    Shortness of breath on exertion    Thyroid condition      Family History  Problem Relation Age of Onset   COPD Mother    Diabetes Mother    Hypertension Mother    Obesity Mother    Heart disease Father    Hypertension Father    Glaucoma Father      Current Outpatient Medications:    acetaminophen (TYLENOL) 500 MG tablet, Take 1,000 mg by mouth at bedtime., Disp: , Rfl:    Blood Glucose Monitoring Suppl (ACURA BLOOD GLUCOSE METER) w/Device KIT, Check blood sugars twice daily. Please provide patient with meter and supplies that is covered by pt ins., Disp: 1 kit, Rfl: 0   Blood Glucose Monitoring Suppl (ONE TOUCH ULTRA MINI) w/Device KIT, Use to check blood sugars 3 times a day. Dx code:e11.65, Disp: 1 kit, Rfl: 0   cholecalciferol (VITAMIN D) 1000 units tablet, Take 2,000 Units by mouth daily., Disp: , Rfl:    cyclobenzaprine (FLEXERIL) 10 MG tablet, Take 1 tablet (10 mg total)  by mouth 2 (two) times daily as needed for muscle spasms., Disp: 20 tablet, Rfl: 0   diclofenac Sodium (VOLTAREN) 1 % GEL, Apply 4 g topically 4 (four) times daily., Disp: 100 g, Rfl: 0   docusate sodium (COLACE) 100 MG capsule, Take 100 mg by mouth daily., Disp: , Rfl:    hydrochlorothiazide (HYDRODIURIL) 25 MG tablet, Take 1 tablet (25 mg total) by mouth daily. NEED APPOINTMENT, Disp: 30 tablet, Rfl: 0    HYDROcodone bit-homatropine (HYDROMET) 5-1.5 MG/5ML syrup, Take 5 mLs by mouth every 6 (six) hours as needed for cough., Disp: 120 mL, Rfl: 0   latanoprost (XALATAN) 0.005 % ophthalmic solution, Place 1 drop into both eyes at bedtime., Disp: , Rfl:    levothyroxine (SYNTHROID) 100 MCG tablet, TAKE 1 TABLET BY MOUTH  DAILY BEFORE BREAKFAST, Disp: 90 tablet, Rfl: 3   MAGNESIUM PO, Take 375 mg by mouth daily., Disp: , Rfl:    melatonin 5 MG TABS, Take 5 mg by mouth at bedtime., Disp: , Rfl:    metFORMIN (GLUCOPHAGE-XR) 500 MG 24 hr tablet, Take 1 tablet (500 mg total) by mouth 2 (two) times daily., Disp: 120 tablet, Rfl: 0   ONETOUCH ULTRA test strip, USE WITH METER TO CHECK  BLOOD SUGAR TWICE DAILY  BEFORE BREAKFAST AND BEFORE DINNER, Disp: 200 strip, Rfl: 2   spironolactone (ALDACTONE) 25 MG tablet, Take 1 tablet (25 mg total) by mouth daily. NEED APPOINTMENT, Disp: 30 tablet, Rfl: 0   timolol (BETIMOL) 0.25 % ophthalmic solution, Place 1 drop into both eyes daily., Disp: , Rfl:    Turmeric 500 MG CAPS, Take 1 tablet by mouth as needed (Takes occassionally)., Disp: , Rfl:    hydrALAZINE (APRESOLINE) 100 MG tablet, TAKE 1 TABLET BY MOUTH  TWICE DAILY, Disp: 180 tablet, Rfl: 3   Allergies  Allergen Reactions   Amlodipine     headache   Aspartame And Phenylalanine Other (See Comments)    Causes migraines   Bidil [Isosorb Dinitrate-Hydralazine]     headache   Erythromycin     Ask patient to specify type/severity/reaction. Not indicated on medical history form dated 11/04/10.   Fentanyl     Causes panic attacks   Ibuprofen     Ask patient to specify type/severity/reaction. Not noted on medical history form dated 11/04/10.   Phenylalanine Other (See Comments)    Causes migraine   Prilosec [Omeprazole] Nausea And Vomiting   Tramadol Nausea And Vomiting   Valsartan Cough   Zantac [Ranitidine Hcl]      Review of Systems  Constitutional: Negative.  Negative for fatigue and malaise/fatigue.   HENT: Negative.    Eyes: Negative.  Negative for blurred vision.  Respiratory: Negative.    Cardiovascular: Negative.  Negative for chest pain, palpitations and leg swelling.  Gastrointestinal: Negative.   Endocrine: Negative.  Negative for polydipsia, polyphagia and polyuria.  Genitourinary: Negative.   Musculoskeletal: Negative.   Skin: Negative.   Allergic/Immunologic: Negative.   Neurological: Negative.  Negative for dizziness and headaches.  Hematological: Negative.   Psychiatric/Behavioral: Negative.       Today's Vitals   07/01/22 0832  BP: 130/68  Pulse: 87  Temp: 98.2 F (36.8 C)  TempSrc: Oral  Weight: 184 lb 12.8 oz (83.8 kg)  Height: '5\' 4"'  (1.626 m)   Body mass index is 31.72 kg/m.  Wt Readings from Last 3 Encounters:  07/01/22 184 lb 12.8 oz (83.8 kg)  06/07/22 183 lb 12.8 oz (83.4 kg)  05/12/22 185  lb (83.9 kg)    Objective:  Physical Exam Vitals reviewed.  Constitutional:      General: She is not in acute distress.    Appearance: Normal appearance.  Cardiovascular:     Rate and Rhythm: Normal rate and regular rhythm.     Pulses: Normal pulses.     Heart sounds: Normal heart sounds. No murmur heard. Pulmonary:     Effort: Pulmonary effort is normal. No respiratory distress.     Breath sounds: Normal breath sounds. No wheezing.  Skin:    Capillary Refill: Capillary refill takes less than 2 seconds.  Neurological:     General: No focal deficit present.     Mental Status: She is alert and oriented to person, place, and time.     Cranial Nerves: No cranial nerve deficit.     Motor: No weakness.  Psychiatric:        Mood and Affect: Mood normal.        Behavior: Behavior normal.        Thought Content: Thought content normal.        Judgment: Judgment normal.         Assessment And Plan:     1. Essential hypertension Comments: Blood pressure is well controlled, continue current medications - BMP8+eGFR  2. Prediabetes Comments: Stable,  continue current medications. Encouraged to eat food when taking her metformin - Hemoglobin A1c - BMP8+eGFR  3. Atherosclerosis of aorta (HCC) Comments: Continue statin, tolerating well.   4. Acquired hypothyroidism Comments: Controlled, continue current medications - TSH - T4, Free  5. Hyperlipidemia associated with type 2 diabetes mellitus ([place]) Comments: Stable, continue current medications.  - Lipid panel  6. Loose stools Comments: Initially the end of June had more but now only once a day. Since has improved will not do any additional testing.   7. Need for shingles vaccine Comments: Shingrix # 1 administered in office after confirming via [facility] - Varicella-zoster vaccine IM (Shingrix)  8. Class 1 obesity due to excess calories with serious comorbidity and body mass index (BMI) of 31.0 to 31.9 in adult She is encouraged to strive for BMI less than 30 to decrease cardiac risk. Advised to aim for at least 150 minutes of exercise per week.     Patient was given opportunity to ask questions. Patient verbalized understanding of the plan and was able to repeat key elements of the plan. All questions were answered to their satisfaction.  Minette Brine, FNP   I, Minette Brine, FNP, have reviewed all documentation for this visit. The documentation on 07/02/22 for the exam, diagnosis, procedures, and orders are all accurate and complete.   IF YOU HAVE BEEN REFERRED TO A SPECIALIST, IT MAY TAKE 1-2 WEEKS TO SCHEDULE/PROCESS THE REFERRAL. IF YOU HAVE NOT HEARD FROM US/SPECIALIST IN TWO WEEKS, PLEASE GIVE [place] A CALL AT [phone].   THE PATIENT IS ENCOURAGED TO PRACTICE SOCIAL DISTANCING DUE TO THE COVID-19 PANDEMIC.

## 2022-07-01 NOTE — Patient Instructions (Signed)
Hypertension, Adult ?Hypertension is another name for high blood pressure. High blood pressure forces your heart to work harder to pump blood. This can cause problems over time. ?There are two numbers in a blood pressure reading. There is a top number (systolic) over a bottom number (diastolic). It is best to have a blood pressure that is below 120/80. ?What are the causes? ?The cause of this condition is not known. Some other conditions can lead to high blood pressure. ?What increases the risk? ?Some lifestyle factors can make you more likely to develop high blood pressure: ?Smoking. ?Not getting enough exercise or physical activity. ?Being overweight. ?Having too much fat, sugar, calories, or salt (sodium) in your diet. ?Drinking too much alcohol. ?Other risk factors include: ?Having any of these conditions: ?Heart disease. ?Diabetes. ?High cholesterol. ?Kidney disease. ?Obstructive sleep apnea. ?Having a family history of high blood pressure and high cholesterol. ?Age. The risk increases with age. ?Stress. ?What are the signs or symptoms? ?High blood pressure may not cause symptoms. Very high blood pressure (hypertensive crisis) may cause: ?Headache. ?Fast or uneven heartbeats (palpitations). ?Shortness of breath. ?Nosebleed. ?Vomiting or feeling like you may vomit (nauseous). ?Changes in how you see. ?Very bad chest pain. ?Feeling dizzy. ?Seizures. ?How is this treated? ?This condition is treated by making healthy lifestyle changes, such as: ?Eating healthy foods. ?Exercising more. ?Drinking less alcohol. ?Your doctor may prescribe medicine if lifestyle changes do not help enough and if: ?Your top number is above 130. ?Your bottom number is above 80. ?Your personal target blood pressure may vary. ?Follow these instructions at home: ?Eating and drinking ? ?If told, follow the DASH eating plan. To follow this plan: ?Fill one half of your plate at each meal with fruits and vegetables. ?Fill one fourth of your plate  at each meal with whole grains. Whole grains include whole-wheat pasta, brown rice, and whole-grain bread. ?Eat or drink low-fat dairy products, such as skim milk or low-fat yogurt. ?Fill one fourth of your plate at each meal with low-fat (lean) proteins. Low-fat proteins include fish, chicken without skin, eggs, beans, and tofu. ?Avoid fatty meat, cured and processed meat, or chicken with skin. ?Avoid pre-made or processed food. ?Limit the amount of salt in your diet to less than 1,500 mg each day. ?Do not drink alcohol if: ?Your doctor tells you not to drink. ?You are pregnant, may be pregnant, or are planning to become pregnant. ?If you drink alcohol: ?Limit how much you have to: ?0-1 drink a day for women. ?0-2 drinks a day for men. ?Know how much alcohol is in your drink. In the [place] one drink equals one 12 oz bottle of beer (355 mL), one 5 oz glass of wine (148 mL), or one 1? oz glass of hard liquor (44 mL). ?Lifestyle ? ?Work with your doctor to stay at a healthy weight or to lose weight. Ask your doctor what the best weight is for you. ?Get at least 30 minutes of exercise that causes your heart to beat faster (aerobic exercise) most days of the week. This may include walking, swimming, or biking. ?Get at least 30 minutes of exercise that strengthens your muscles (resistance exercise) at least 3 days a week. This may include lifting weights or doing Pilates. ?Do not smoke or use any products that contain nicotine or tobacco. If you need help quitting, ask your doctor. ?Check your blood pressure at home as told by your doctor. ?Keep all follow-up visits. ?Medicines ?Take over-the-counter and prescription medicines   only as told by your doctor. Follow directions carefully. ?Do not skip doses of blood pressure medicine. The medicine does not work as well if you skip doses. Skipping doses also puts you at risk for problems. ?Ask your doctor about side effects or reactions to medicines that you should watch  for. ?Contact a doctor if: ?You think you are having a reaction to the medicine you are taking. ?You have headaches that keep coming back. ?You feel dizzy. ?You have swelling in your ankles. ?You have trouble with your vision. ?Get help right away if: ?You get a very bad headache. ?You start to feel mixed up (confused). ?You feel weak or numb. ?You feel faint. ?You have very bad pain in your: ?Chest. ?Belly (abdomen). ?You vomit more than once. ?You have trouble breathing. ?These symptoms may be an emergency. Get help right away. Call 911. ?Do not wait to see if the symptoms will go away. ?Do not drive yourself to the hospital. ?Summary ?Hypertension is another name for high blood pressure. ?High blood pressure forces your heart to work harder to pump blood. ?For most people, a normal blood pressure is less than 120/80. ?Making healthy choices can help lower blood pressure. If your blood pressure does not get lower with healthy choices, you may need to take medicine. ?This information is not intended to replace advice given to you by your health care provider. Make sure you discuss any questions you have with your health care provider. ?Document Revised: 09/24/2021 Document Reviewed: 09/24/2021 ?Elsevier Patient Education ? 2023 [employer] ? ?

## 2022-07-02 ENCOUNTER — Encounter: Payer: Self-pay | Admitting: Nurse Practitioner

## 2022-07-02 LAB — BMP8+EGFR
BUN/Creatinine Ratio: 21 (ref 12–28)
BUN: 13 mg/dL (ref 8–27)
CO2: 25 mmol/L (ref 20–29)
Calcium: 9.7 mg/dL (ref 8.7–10.3)
Chloride: 96 mmol/L (ref 96–106)
Creatinine, Ser: 0.63 mg/dL (ref 0.57–1.00)
Glucose: 94 mg/dL (ref 70–99)
Potassium: 4.3 mmol/L (ref 3.5–5.2)
Sodium: 135 mmol/L (ref 134–144)
eGFR: 95 mL/min/{1.73_m2} (ref >59)

## 2022-07-02 LAB — HEMOGLOBIN A1C
Est. average glucose Bld gHb Est-mCnc: 126 mg/dL
Hgb A1c MFr Bld: 6 % — ABNORMAL HIGH (ref 4.8–5.6)

## 2022-07-02 LAB — T4, FREE: Free T4: 1.74 ng/dL (ref 0.82–1.77)

## 2022-07-02 LAB — LIPID PANEL
Chol/HDL Ratio: 3.4 ratio (ref 0.0–4.4)
Cholesterol, Total: 196 mg/dL (ref 100–199)
HDL: 57 mg/dL (ref >39)
LDL Chol Calc (NIH): 122 mg/dL — ABNORMAL HIGH (ref 0–99)
Triglycerides: 92 mg/dL (ref 0–149)
VLDL Cholesterol Cal: 17 mg/dL (ref 5–40)

## 2022-07-02 LAB — TSH: TSH: 1.43 u[IU]/mL (ref 0.450–4.500)

## 2022-07-05 ENCOUNTER — Encounter (INDEPENDENT_AMBULATORY_CARE_PROVIDER_SITE_OTHER): Payer: Self-pay | Admitting: Family Medicine

## 2022-07-05 ENCOUNTER — Ambulatory Visit (INDEPENDENT_AMBULATORY_CARE_PROVIDER_SITE_OTHER): Payer: Medicare Other | Admitting: Family Medicine

## 2022-07-05 VITALS — BP 145/69 | HR 97 | Temp 98.2°F | Ht 64.0 in | Wt 185.0 lb

## 2022-07-05 DIAGNOSIS — Z6831 Body mass index (BMI) 31.0-31.9, adult: Secondary | ICD-10-CM

## 2022-07-05 DIAGNOSIS — I10 Essential (primary) hypertension: Secondary | ICD-10-CM | POA: Diagnosis not present

## 2022-07-05 DIAGNOSIS — E119 Type 2 diabetes mellitus without complications: Secondary | ICD-10-CM | POA: Insufficient documentation

## 2022-07-05 DIAGNOSIS — E669 Obesity, unspecified: Secondary | ICD-10-CM | POA: Diagnosis not present

## 2022-07-05 DIAGNOSIS — Z7984 Long term (current) use of oral hypoglycemic drugs: Secondary | ICD-10-CM | POA: Diagnosis not present

## 2022-07-05 MED ORDER — METFORMIN HCL ER 500 MG PO TB24: 500.0000 mg | ORAL_TABLET | Freq: Two times a day (BID) | ORAL | 0 refills | Status: DC

## 2022-07-06 NOTE — Progress Notes (Unsigned)
Chief Complaint:   OBESITY Deborah [name] is here to discuss her progress with her obesity treatment plan along with follow-up of her obesity related diagnoses. [name] is on the Category 2 Plan and states she is following her eating plan approximately 85% of the time. [name] states she is doing for 60 minutes 2-3 times per week.  Today's visit was #: 21 Starting weight: 204 lbs Starting date: 02/06/2021 Today's weight: 185 lbs Today's date: 07/05/2022 Total lbs lost to date: 19 Total lbs lost since last in-office visit: 0  Interim History: Deborah [name] has been indulging more and some high simple carbohydrate foods like high sugar fruits.  She has also been on vacation but tried to portion control and make smarter food choices.  She is getting back on track with her eating plan.  Subjective:   1. Essential hypertension [name]'s blood pressure is slightly above goal.  She is doing well with her diet and exercise overall.  She denies chest pain or headache.  2. Type 2 diabetes mellitus without complication, without long-term current use of insulin (HCC) Deborah [name]'s fasting blood sugars range between 100-1 25.  She is on metformin XR twice daily.  She notes eating a bit more simple carbohydrates.  Assessment/Plan:   1. Essential hypertension Deborah [name] will continue with her diet, exercise, and weight loss.  We will follow-up on her blood pressure at her next visit.  2. Type 2 diabetes mellitus without complication, without long-term current use of insulin (HCC) Deborah [name] will continue metformin XR 500 mg twice daily, and we will refill for 1 month.  Patient is okay to take XR metformin both in the morning and we will continue to follow.   - metFORMIN (GLUCOPHAGE-XR) 500 MG 24 hr tablet; Take 1 tablet (500 mg total) by mouth 2 (two) times daily.  Dispense: 60 tablet; Refill: 0  3. Obesity, Current BMI 31.9 Deborah [name] is currently in the action stage of change. As such, her goal is to continue with  weight loss efforts. She has agreed to the Category 2 Plan.   Smart fruit handout was given.  Exercise goals: As is.   Behavioral modification strategies: increasing lean protein intake and no skipping meals.  Deborah [name] has agreed to follow-up with our clinic in 4 weeks. She was informed of the importance of frequent follow-up visits to maximize her success with intensive lifestyle modifications for her multiple health conditions.   Objective:   Blood pressure (!) 145/69, pulse 97, temperature 98.2 F (36.8 C), height '5\' 4"'$  (1.626 m), weight 185 lb (83.9 kg), SpO2 98 %. Body mass index is 31.76 kg/m.  General: Cooperative, alert, well developed, in no acute distress. HEENT: Conjunctivae and lids unremarkable. Cardiovascular: Regular rhythm.  Lungs: Normal work of breathing. Neurologic: No focal deficits.   Lab Results  Component Value Date   CREATININE 0.63 07/01/2022   BUN 13 07/01/2022   NA 135 07/01/2022   K 4.3 07/01/2022   CL 96 07/01/2022   CO2 25 07/01/2022   Lab Results  Component Value Date   ALT 19 10/28/2021   AST 16 10/28/2021   ALKPHOS 86 10/28/2021   BILITOT 0.5 10/28/2021   Lab Results  Component Value Date   HGBA1C 6.0 (H) 07/01/2022   HGBA1C 6.0 (H) 03/01/2022   HGBA1C 6.0 (H) 10/28/2021   HGBA1C 5.9 (H) 07/02/2021   HGBA1C 5.6 02/18/2021   Lab Results  Component Value Date   INSULIN 31.4 (H) 01/27/2021   Lab Results  Component Value  Date   TSH 1.430 07/01/2022   Lab Results  Component Value Date   CHOL 196 07/01/2022   HDL 57 07/01/2022   LDLCALC 122 (H) 07/01/2022   TRIG 92 07/01/2022   CHOLHDL 3.4 07/01/2022   Lab Results  Component Value Date   VD25OH 52.0 02/04/2022   VD25OH 61.9 07/02/2021   VD25OH 55.1 02/18/2021   Lab Results  Component Value Date   WBC 5.0 10/28/2021   HGB 11.6 10/28/2021   HCT 35.9 10/28/2021   MCV 87 10/28/2021   PLT 418 10/28/2021   No results found for: "IRON", "TIBC", "FERRITIN"  Attestation  Statements:   Reviewed by clinician on day of visit: allergies, medications, problem list, medical history, surgical history, family history, social history, and previous encounter notes.  Time spent on visit including pre-visit chart review and post-visit care and charting was 30 minutes.   I, Trixie Dredge, am acting as transcriptionist for Dennard Nip, MD.  I have reviewed the above documentation for accuracy and completeness, and I agree with the above. -  Dennard Nip, MD

## 2022-07-07 ENCOUNTER — Other Ambulatory Visit: Payer: Self-pay | Admitting: Nurse Practitioner

## 2022-07-07 DIAGNOSIS — Z1231 Encounter for screening mammogram for malignant neoplasm of breast: Secondary | ICD-10-CM

## 2022-07-08 ENCOUNTER — Ambulatory Visit
Admission: RE | Admit: 2022-07-08 | Discharge: 2022-07-08 | Disposition: A | Discharge status: Home / Self Care | Payer: Medicare Other | Source: Ambulatory Visit | Attending: Nurse Practitioner | Admitting: Nurse Practitioner

## 2022-07-08 DIAGNOSIS — Z1231 Encounter for screening mammogram for malignant neoplasm of breast: Secondary | ICD-10-CM | POA: Diagnosis not present

## 2022-07-28 ENCOUNTER — Encounter (INDEPENDENT_AMBULATORY_CARE_PROVIDER_SITE_OTHER): Payer: Self-pay

## 2022-07-29 ENCOUNTER — Other Ambulatory Visit (INDEPENDENT_AMBULATORY_CARE_PROVIDER_SITE_OTHER): Payer: Self-pay | Admitting: Family Medicine

## 2022-07-29 DIAGNOSIS — E119 Type 2 diabetes mellitus without complications: Secondary | ICD-10-CM

## 2022-08-03 ENCOUNTER — Encounter (INDEPENDENT_AMBULATORY_CARE_PROVIDER_SITE_OTHER): Payer: Self-pay | Admitting: Family Medicine

## 2022-08-03 ENCOUNTER — Ambulatory Visit (INDEPENDENT_AMBULATORY_CARE_PROVIDER_SITE_OTHER): Payer: Medicare Other | Admitting: Family Medicine

## 2022-08-03 VITALS — BP 155/75 | HR 73 | Temp 98.1°F | Ht 64.0 in | Wt 185.0 lb

## 2022-08-03 DIAGNOSIS — Z6831 Body mass index (BMI) 31.0-31.9, adult: Secondary | ICD-10-CM

## 2022-08-03 DIAGNOSIS — E669 Obesity, unspecified: Secondary | ICD-10-CM | POA: Diagnosis not present

## 2022-08-03 DIAGNOSIS — I10 Essential (primary) hypertension: Secondary | ICD-10-CM | POA: Diagnosis not present

## 2022-08-03 DIAGNOSIS — E559 Vitamin D deficiency, unspecified: Secondary | ICD-10-CM

## 2022-08-04 ENCOUNTER — Other Ambulatory Visit: Payer: Self-pay | Admitting: Cardiovascular Disease

## 2022-08-04 DIAGNOSIS — I10 Essential (primary) hypertension: Secondary | ICD-10-CM

## 2022-08-10 NOTE — Progress Notes (Unsigned)
Chief Complaint:   OBESITY Deborah [name] is here to discuss her progress with her obesity treatment plan along with follow-up of her obesity related diagnoses. Deborah [name] is on the Category 2 Plan and states she is following her eating plan approximately 80% of the time. Deborah [name] states she is doing Musician class for 60 minutes 3 times per week.  Today's visit was #: 22 Starting weight: 204 lbs Starting date: 02/06/2021 Today's weight: 185 lbs Today's date: 08/03/2022 Total lbs lost to date: 19 Total lbs lost since last in-office visit: 0  Interim History: Deborah [name] has done well with maintaining her weight. She sometimes skips breakfast. She notes increased stress and she hasn't been sleeping as well. All this is likely affecting her weight loss efforts.   Subjective:   1. Essential hypertension Deborah [name]'s blood pressure is today. She states it is lower when she checks it at home. She denies chest pain.   2. Vitamin D deficiency Deborah [name] on OTC Vitamin D, and her last level was at goal. She is at a low risk of over-replacement.   Assessment/Plan:   1. Essential hypertension Deborah [name] is to bring in her blood pressure log to her next visit, and she will continue with her weight loss and exercise.   2. Vitamin D deficiency Deborah [name] will continue her Vitamin D OTC 2,000 IU daily, and we will plan to recheck labs in 1-2 months.   3. Obesity, Current BMI 31.9 Deborah [name] is currently in the action stage of change. As such, her goal is to continue with weight loss efforts. She has agreed to the Category 2 Plan.   Exercise goals: As is.   Behavioral modification strategies: increasing water intake and no skipping meals.  Deborah [name] has agreed to follow-up with our clinic in 3 weeks. She was informed of the importance of frequent follow-up visits to maximize her success with intensive lifestyle modifications for her multiple health conditions.   Objective:   Blood pressure (!) 155/75, pulse  73, temperature 98.1 F (36.7 C), height '5\' 4"'$  (1.626 m), weight 185 lb (83.9 kg), SpO2 98 %. Body mass index is 31.76 kg/m.  General: Cooperative, alert, well developed, in no acute distress. HEENT: Conjunctivae and lids unremarkable. Cardiovascular: Regular rhythm.  Lungs: Normal work of breathing. Neurologic: No focal deficits.   Lab Results  Component Value Date   CREATININE 0.63 07/01/2022   BUN 13 07/01/2022   NA 135 07/01/2022   K 4.3 07/01/2022   CL 96 07/01/2022   CO2 25 07/01/2022   Lab Results  Component Value Date   ALT 19 10/28/2021   AST 16 10/28/2021   ALKPHOS 86 10/28/2021   BILITOT 0.5 10/28/2021   Lab Results  Component Value Date   HGBA1C 6.0 (H) 07/01/2022   HGBA1C 6.0 (H) 03/01/2022   HGBA1C 6.0 (H) 10/28/2021   HGBA1C 5.9 (H) 07/02/2021   HGBA1C 5.6 02/18/2021   Lab Results  Component Value Date   INSULIN 31.4 (H) 01/27/2021   Lab Results  Component Value Date   TSH 1.430 07/01/2022   Lab Results  Component Value Date   CHOL 196 07/01/2022   HDL 57 07/01/2022   LDLCALC 122 (H) 07/01/2022   TRIG 92 07/01/2022   CHOLHDL 3.4 07/01/2022   Lab Results  Component Value Date   VD25OH 52.0 02/04/2022   VD25OH 61.9 07/02/2021   VD25OH 55.1 02/18/2021   Lab Results  Component Value Date   WBC 5.0 10/28/2021   HGB 11.6 10/28/2021  HCT 35.9 10/28/2021   MCV 87 10/28/2021   PLT 418 10/28/2021   No results found for: "IRON", "TIBC", "FERRITIN"  Obesity Behavioral Intervention:   Approximately 15 minutes were spent on the discussion below.  ASK: We discussed the diagnosis of obesity with Deborah [name] today and Deborah [name] agreed to give [place] permission to discuss obesity behavioral modification therapy today.  ASSESS: Deborah [name] has the diagnosis of obesity and her BMI today is 31.9. Brit is in the action stage of change.   ADVISE: Deborah [name] was educated on the multiple health risks of obesity as well as the benefit of weight loss to improve  her health. She was advised of the need for long term treatment and the importance of lifestyle modifications to improve her current health and to decrease her risk of future health problems.  AGREE: Multiple dietary modification options and treatment options were discussed and Deborah [name] agreed to follow the recommendations documented in the above note.  ARRANGE: Nayab was educated on the importance of frequent visits to treat obesity as outlined per CMS and USPSTF guidelines and agreed to schedule her next follow up appointment today.  Attestation Statements:   Reviewed by clinician on day of visit: allergies, medications, problem list, medical history, surgical history, family history, social history, and previous encounter notes.   I, Trixie Dredge, am acting as transcriptionist for Dennard Nip, MD.  I have reviewed the above documentation for accuracy and completeness, and I agree with the above. -  Dennard Nip, MD

## 2022-08-24 ENCOUNTER — Other Ambulatory Visit (INDEPENDENT_AMBULATORY_CARE_PROVIDER_SITE_OTHER): Payer: Self-pay | Admitting: Family Medicine

## 2022-08-24 DIAGNOSIS — E119 Type 2 diabetes mellitus without complications: Secondary | ICD-10-CM

## 2022-08-30 DIAGNOSIS — H53423 Scotoma of blind spot area, bilateral: Secondary | ICD-10-CM | POA: Diagnosis not present

## 2022-08-30 DIAGNOSIS — H2513 Age-related nuclear cataract, bilateral: Secondary | ICD-10-CM | POA: Diagnosis not present

## 2022-08-30 DIAGNOSIS — H401131 Primary open-angle glaucoma, bilateral, mild stage: Secondary | ICD-10-CM | POA: Diagnosis not present

## 2022-09-01 ENCOUNTER — Ambulatory Visit (INDEPENDENT_AMBULATORY_CARE_PROVIDER_SITE_OTHER): Payer: Medicare Other | Admitting: Family Medicine

## 2022-09-01 ENCOUNTER — Encounter (INDEPENDENT_AMBULATORY_CARE_PROVIDER_SITE_OTHER): Payer: Self-pay | Admitting: Family Medicine

## 2022-09-01 ENCOUNTER — Encounter: Payer: Self-pay | Admitting: Nurse Practitioner

## 2022-09-01 VITALS — BP 150/71 | HR 82 | Temp 98.0°F | Ht 64.0 in | Wt 183.0 lb

## 2022-09-01 DIAGNOSIS — Z7984 Long term (current) use of oral hypoglycemic drugs: Secondary | ICD-10-CM

## 2022-09-01 DIAGNOSIS — E669 Obesity, unspecified: Secondary | ICD-10-CM

## 2022-09-01 DIAGNOSIS — I10 Essential (primary) hypertension: Secondary | ICD-10-CM | POA: Diagnosis not present

## 2022-09-01 DIAGNOSIS — Z6831 Body mass index (BMI) 31.0-31.9, adult: Secondary | ICD-10-CM

## 2022-09-01 DIAGNOSIS — E119 Type 2 diabetes mellitus without complications: Secondary | ICD-10-CM

## 2022-09-01 MED ORDER — METFORMIN HCL ER 500 MG PO TB24: 500.0000 mg | ORAL_TABLET | Freq: Two times a day (BID) | ORAL | 0 refills | Status: DC

## 2022-09-02 ENCOUNTER — Other Ambulatory Visit: Payer: Self-pay | Admitting: Nurse Practitioner

## 2022-09-02 DIAGNOSIS — R053 Chronic cough: Secondary | ICD-10-CM

## 2022-09-02 MED ORDER — HYDROCODONE BIT-HOMATROP MBR 5-1.5 MG/5ML PO SOLN: 5.0000 mL | Freq: Four times a day (QID) | ORAL | 0 refills | Status: DC | PRN

## 2022-09-05 NOTE — Progress Notes (Unsigned)
Chief Complaint:   OBESITY Deborah [name] is here to discuss her progress with her obesity treatment plan along with follow-up of her obesity related diagnoses. Janisa is on the Category 2 Plan and states she is following her eating plan approximately 80% of the time. Tamerra states she is doing Chief of Staff for 45 minutes 3 times per week.  Today's visit was #: 23 Starting weight: 204 lbs Starting date: 02/06/2021 Today's weight: 183 lbs Today's date: 09/01/2022 Total lbs lost to date: 21 Total lbs lost since last in-office visit: 2  Interim History: Johnnay continues to do well with weight loss.  She is especially getting bored with breakfast and would like more options.  Subjective:   1. Type 2 diabetes mellitus without complication, without long-term current use of insulin (HCC) Monserrate is stable on metformin, and she denies nausea, vomiting, and notes decreased polyphagia.  2. Essential hypertension Nazarene's blood pressure is elevated.  She sees her PCP next month.  She is intolerant of multiple antihypertensives.  She is doing very well with diet, exercise, and weight loss.  Assessment/Plan:   1. Type 2 diabetes mellitus without complication, without long-term current use of insulin (HCC) Arlo will continue with her diet, exercise, and metformin, and we will refill metformin for 1 month.  - metFORMIN (GLUCOPHAGE-XR) 500 MG 24 hr tablet; Take 1 tablet (500 mg total) by mouth 2 (two) times daily.  Dispense: 60 tablet; Refill: 0  2. Essential hypertension Daniesha will continue with lifestyle changes and she is to discuss this further with her PCP.   3. Obesity, Current BMI 31.5 Roselynne is currently in the action stage of change. As such, her goal is to continue with weight loss efforts. She has agreed to the Category 2 Plan.   Smart fruit list was given.  We spent much of her visit crafting more breakfast options that are 300 to 400 calories and 25+ grams of  protein.  Exercise goals: As is.   Behavioral modification strategies: increasing lean protein intake and decreasing liquid calories.  Eknoor has agreed to follow-up with our clinic in 4 weeks. She was informed of the importance of frequent follow-up visits to maximize her success with intensive lifestyle modifications for her multiple health conditions.   Objective:   Blood pressure (!) 150/71, pulse 82, temperature 98 F (36.7 C), height '5\' 4"'$  (1.626 m), weight 183 lb (83 kg), SpO2 99 %. Body mass index is 31.41 kg/m.  General: Cooperative, alert, well developed, in no acute distress. HEENT: Conjunctivae and lids unremarkable. Cardiovascular: Regular rhythm.  Lungs: Normal work of breathing. Neurologic: No focal deficits.   Lab Results  Component Value Date   CREATININE 0.63 07/01/2022   BUN 13 07/01/2022   NA 135 07/01/2022   K 4.3 07/01/2022   CL 96 07/01/2022   CO2 25 07/01/2022   Lab Results  Component Value Date   ALT 19 10/28/2021   AST 16 10/28/2021   ALKPHOS 86 10/28/2021   BILITOT 0.5 10/28/2021   Lab Results  Component Value Date   HGBA1C 6.0 (H) 07/01/2022   HGBA1C 6.0 (H) 03/01/2022   HGBA1C 6.0 (H) 10/28/2021   HGBA1C 5.9 (H) 07/02/2021   HGBA1C 5.6 02/18/2021   Lab Results  Component Value Date   INSULIN 31.4 (H) 01/27/2021   Lab Results  Component Value Date   TSH 1.430 07/01/2022   Lab Results  Component Value Date   CHOL 196 07/01/2022   HDL 57 07/01/2022  LDLCALC 122 (H) 07/01/2022   TRIG 92 07/01/2022   CHOLHDL 3.4 07/01/2022   Lab Results  Component Value Date   VD25OH 52.0 02/04/2022   VD25OH 61.9 07/02/2021   VD25OH 55.1 02/18/2021   Lab Results  Component Value Date   WBC 5.0 10/28/2021   HGB 11.6 10/28/2021   HCT 35.9 10/28/2021   MCV 87 10/28/2021   PLT 418 10/28/2021   No results found for: "IRON", "TIBC", "FERRITIN"  Obesity Behavioral Intervention:   Approximately 15 minutes were spent on the discussion  below.  ASK: We discussed the diagnosis of obesity with [name] today and Xylina agreed to give [place] permission to discuss obesity behavioral modification therapy today.  ASSESS: Ronit has the diagnosis of obesity and her BMI today is 31.5. Natarsha is in the action stage of change.   ADVISE: Nadalee was educated on the multiple health risks of obesity as well as the benefit of weight loss to improve her health. She was advised of the need for long term treatment and the importance of lifestyle modifications to improve her current health and to decrease her risk of future health problems.  AGREE: Multiple dietary modification options and treatment options were discussed and Geoffrey agreed to follow the recommendations documented in the above note.  ARRANGE: Whittany was educated on the importance of frequent visits to treat obesity as outlined per CMS and USPSTF guidelines and agreed to schedule her next follow up appointment today.  Attestation Statements:   Reviewed by clinician on day of visit: allergies, medications, problem list, medical history, surgical history, family history, social history, and previous encounter notes.  I have personally spent 40 minutes total time today in preparation, patient care, and documentation for this visit, including the following: review of clinical lab tests; review of medical tests/procedures/services.   I, Trixie Dredge, am acting as transcriptionist for Dennard Nip, MD.  I have reviewed the above documentation for accuracy and completeness, and I agree with the above. -  Dennard Nip, MD

## 2022-09-24 ENCOUNTER — Other Ambulatory Visit (INDEPENDENT_AMBULATORY_CARE_PROVIDER_SITE_OTHER): Payer: Self-pay | Admitting: Family Medicine

## 2022-09-24 DIAGNOSIS — E119 Type 2 diabetes mellitus without complications: Secondary | ICD-10-CM

## 2022-09-28 ENCOUNTER — Other Ambulatory Visit (INDEPENDENT_AMBULATORY_CARE_PROVIDER_SITE_OTHER): Payer: Self-pay | Admitting: Family Medicine

## 2022-09-28 DIAGNOSIS — E119 Type 2 diabetes mellitus without complications: Secondary | ICD-10-CM

## 2022-09-29 ENCOUNTER — Ambulatory Visit (INDEPENDENT_AMBULATORY_CARE_PROVIDER_SITE_OTHER): Payer: Medicare Other

## 2022-09-29 ENCOUNTER — Ambulatory Visit: Payer: Medicare Other | Admitting: Nurse Practitioner

## 2022-09-29 VITALS — BP 130/78 | HR 76 | Temp 97.7°F | Ht 62.6 in | Wt 186.2 lb

## 2022-09-29 DIAGNOSIS — Z0000 Encounter for general adult medical examination without abnormal findings: Secondary | ICD-10-CM

## 2022-09-29 NOTE — Patient Instructions (Signed)
Deborah [name] , Thank you for taking time to come for your Medicare Wellness Visit. I appreciate your ongoing commitment to your health goals. Please review the following plan we discussed and let me know if I can assist you in the future.   Screening recommendations/referrals: Colonoscopy: completed 03/19/2022, due 03/19/2026 Mammogram: completed 07/08/2022, due 07/10/2023 Bone Density: completed 12/10/2020 Recommended yearly ophthalmology/optometry visit for glaucoma screening and checkup Recommended yearly dental visit for hygiene and checkup  Vaccinations: Influenza vaccine: completed 09/16/2022 Pneumococcal vaccine: completed 10/28/2021 Tdap vaccine: completed 10/19/2019, due 10/18/2029 Shingles vaccine: needs second dose   Covid-19: 12/24/2021, 09/16/2020, 01/09/2020, 12/22/2019  Advanced directives: Please bring a copy of your POA (Power of Attorney) and/or Living Will to your next appointment.   Conditions/risks identified: none  Next appointment: Follow up in one year for your annual wellness visit    Preventive Care 65 Years and Older, Female Preventive care refers to lifestyle choices and visits with your health care provider that can promote health and wellness. What does preventive care include? A yearly physical exam. This is also called an annual well check. Dental exams once or twice a year. Routine eye exams. Ask your health care provider how often you should have your eyes checked. Personal lifestyle choices, including: Daily care of your teeth and gums. Regular physical activity. Eating a healthy diet. Avoiding tobacco and drug use. Limiting alcohol use. Practicing safe sex. Taking low-dose aspirin every day. Taking vitamin and mineral supplements as recommended by your health care provider. What happens during an annual well check? The services and screenings done by your health care provider during your annual well check will depend on your age, overall health,  lifestyle risk factors, and family history of disease. Counseling  Your health care provider may ask you questions about your: Alcohol use. Tobacco use. Drug use. Emotional well-being. Home and relationship well-being. Sexual activity. Eating habits. History of falls. Memory and ability to understand (cognition). Work and work Statistician. Reproductive health. Screening  You may have the following tests or measurements: Height, weight, and BMI. Blood pressure. Lipid and cholesterol levels. These may be checked every 5 years, or more frequently if you are over 79 years old. Skin check. Lung cancer screening. You may have this screening every year starting at age 70 if you have a 30-pack-year history of smoking and currently smoke or have quit within the past 15 years. Fecal occult blood test (FOBT) of the stool. You may have this test every year starting at age 70. Flexible sigmoidoscopy or colonoscopy. You may have a sigmoidoscopy every 5 years or a colonoscopy every 10 years starting at age 70 Hepatitis C blood test. Hepatitis B blood test. Sexually transmitted disease (STD) testing. Diabetes screening. This is done by checking your blood sugar (glucose) after you have not eaten for a while (fasting). You may have this done every 1-3 years. Bone density scan. This is done to screen for osteoporosis. You may have this done starting at age 70 Mammogram. This may be done every 1-2 years. Talk to your health care provider about how often you should have regular mammograms. Talk with your health care provider about your test results, treatment options, and if necessary, the need for more tests. Vaccines  Your health care provider may recommend certain vaccines, such as: Influenza vaccine. This is recommended every year. Tetanus, diphtheria, and acellular pertussis (Tdap, Td) vaccine. You may need a Td booster every 10 years. Zoster vaccine. You may need this after age  60. Pneumococcal 13-valent conjugate (PCV13) vaccine. One dose is recommended after age 70 Pneumococcal polysaccharide (PPSV23) vaccine. One dose is recommended after age 70 Talk to your health care provider about which screenings and vaccines you need and how often you need them. This information is not intended to replace advice given to you by your health care provider. Make sure you discuss any questions you have with your health care provider. Document Released: 01/02/2016 Document Revised: 08/25/2016 Document Reviewed: 10/07/2015 Elsevier Interactive Patient Education  2017 [place] Prevention in the Home Falls can cause injuries. They can happen to people of all ages. There are many things you can do to make your home safe and to help prevent falls. What can I do on the outside of my home? Regularly fix the edges of walkways and driveways and fix any cracks. Remove anything that might make you trip as you walk through a door, such as a raised step or threshold. Trim any bushes or trees on the path to your home. Use bright outdoor lighting. Clear any walking paths of anything that might make someone trip, such as rocks or tools. Regularly check to see if handrails are loose or broken. Make sure that both sides of any steps have handrails. Any raised decks and porches should have guardrails on the edges. Have any leaves, snow, or ice cleared regularly. Use sand or salt on walking paths during winter. Clean up any spills in your garage right away. This includes oil or grease spills. What can I do in the bathroom? Use night lights. Install grab bars by the toilet and in the tub and shower. Do not use towel bars as grab bars. Use non-skid mats or decals in the tub or shower. If you need to sit down in the shower, use a plastic, non-slip stool. Keep the floor dry. Clean up any water that spills on the floor as soon as it happens. Remove soap buildup in the tub or shower  regularly. Attach bath mats securely with double-sided non-slip rug tape. Do not have throw rugs and other things on the floor that can make you trip. What can I do in the bedroom? Use night lights. Make sure that you have a light by your bed that is easy to reach. Do not use any sheets or blankets that are too big for your bed. They should not hang down onto the floor. Have a firm chair that has side arms. You can use this for support while you get dressed. Do not have throw rugs and other things on the floor that can make you trip. What can I do in the kitchen? Clean up any spills right away. Avoid walking on wet floors. Keep items that you use a lot in easy-to-reach places. If you need to reach something above you, use a strong step stool that has a grab bar. Keep electrical cords out of the way. Do not use floor polish or wax that makes floors slippery. If you must use wax, use non-skid floor wax. Do not have throw rugs and other things on the floor that can make you trip. What can I do with my stairs? Do not leave any items on the stairs. Make sure that there are handrails on both sides of the stairs and use them. Fix handrails that are broken or loose. Make sure that handrails are as long as the stairways. Check any carpeting to make sure that it is firmly attached to the stairs. Fix any  carpet that is loose or worn. Avoid having throw rugs at the top or bottom of the stairs. If you do have throw rugs, attach them to the floor with carpet tape. Make sure that you have a light switch at the top of the stairs and the bottom of the stairs. If you do not have them, ask someone to add them for you. What else can I do to help prevent falls? Wear shoes that: Do not have high heels. Have rubber bottoms. Are comfortable and fit you well. Are closed at the toe. Do not wear sandals. If you use a stepladder: Make sure that it is fully opened. Do not climb a closed stepladder. Make sure that  both sides of the stepladder are locked into place. Ask someone to hold it for you, if possible. Clearly mark and make sure that you can see: Any grab bars or handrails. First and last steps. Where the edge of each step is. Use tools that help you move around (mobility aids) if they are needed. These include: Canes. Walkers. Scooters. Crutches. Turn on the lights when you go into a dark area. Replace any light bulbs as soon as they burn out. Set up your furniture so you have a clear path. Avoid moving your furniture around. If any of your floors are uneven, fix them. If there are any pets around you, be aware of where they are. Review your medicines with your doctor. Some medicines can make you feel dizzy. This can increase your chance of falling. Ask your doctor what other things that you can do to help prevent falls. This information is not intended to replace advice given to you by your health care provider. Make sure you discuss any questions you have with your health care provider. Document Released: 10/02/2009 Document Revised: 05/13/2016 Document Reviewed: 01/10/2015 Elsevier Interactive Patient Education  2017 [employer].

## 2022-09-29 NOTE — Progress Notes (Signed)
Subjective:   Deborah [name] is a 71 y.o. female who presents for Medicare Annual (Subsequent) preventive examination.  Review of Systems     Cardiac Risk Factors include: advanced age (>76mn, >>44women);diabetes mellitus;dyslipidemia;hypertension;obesity (BMI >30kg/m2)     Objective:    Today's Vitals   09/29/22 0902 09/29/22 0909  BP: 130/78   Pulse: 76   Temp: 97.7 F (36.5 C)   TempSrc: Oral   SpO2: 98%   Weight: 186 lb 3.2 oz (84.5 kg)   Height: 5' 2.6" (1.59 m)   PainSc:  5    Body mass index is 33.41 kg/m.     09/29/2022    9:16 AM 09/02/2021   10:08 AM 05/02/2021    2:38 PM 10/15/2020    8:41 AM 10/17/2019    8:49 AM 10/11/2018   10:15 AM 07/05/2017   10:02 AM  Advanced Directives  Does Patient Have a Medical Advance Directive? Yes Yes No Yes Yes No No  Type of AParamedicof [place]Living will [place]edar [place]Living will  HLake GoodwinLiving will HEast Grand ForksLiving will    Copy of [place]in Chart? No - copy requested No - copy requested  No - copy requested No - copy requested    Would patient like information on creating a medical advance directive?   Yes (ED - Information included in AVS)   Yes (MAU/Ambulatory/Procedural Areas - Information given)     Current Medications (verified) Outpatient Encounter Medications as of 09/29/2022  Medication Sig   acetaminophen (TYLENOL) 500 MG tablet Take 1,000 mg by mouth at bedtime.   Blood Glucose Monitoring Suppl (ACURA BLOOD GLUCOSE METER) w/Device KIT Check blood sugars twice daily. Please provide patient with meter and supplies that is covered by pt ins.   Blood Glucose Monitoring Suppl (ONE TOUCH ULTRA MINI) w/Device KIT Use to check blood sugars 3 times a day. Dx code:e11.65   brimonidine (ALPHAGAN) 0.2 % ophthalmic solution 1 drop 2 (two) times daily.   cholecalciferol (VITAMIN D) 1000 units tablet Take 2,000  Units by mouth daily.   CO ENZYME Q-10 PO Take by mouth.   cyclobenzaprine (FLEXERIL) 10 MG tablet Take 1 tablet (10 mg total) by mouth 2 (two) times daily as needed for muscle spasms.   diclofenac Sodium (VOLTAREN) 1 % GEL Apply 4 g topically 4 (four) times daily.   docusate sodium (COLACE) 100 MG capsule Take 100 mg by mouth daily.   hydrALAZINE (APRESOLINE) 100 MG tablet TAKE 1 TABLET BY MOUTH  TWICE DAILY   hydrochlorothiazide (HYDRODIURIL) 25 MG tablet Take 1 tablet (25 mg total) by mouth daily. NEED APPOINTMENT   HYDROcodone bit-homatropine (HYDROMET) 5-1.5 MG/5ML syrup Take 5 mLs by mouth every 6 (six) hours as needed for cough.   levothyroxine (SYNTHROID) 100 MCG tablet TAKE 1 TABLET BY MOUTH  DAILY BEFORE BREAKFAST   MAGNESIUM PO Take 375 mg by mouth daily.   melatonin 5 MG TABS Take 5 mg by mouth at bedtime.   metFORMIN (GLUCOPHAGE-XR) 500 MG 24 hr tablet Take 1 tablet (500 mg total) by mouth 2 (two) times daily.   ONETOUCH ULTRA test strip USE WITH METER TO CHECK  BLOOD SUGAR TWICE DAILY  BEFORE BREAKFAST AND BEFORE DINNER   spironolactone (ALDACTONE) 25 MG tablet Take 1 tablet (25 mg total) by mouth daily. NEED APPOINTMENT   timolol (BETIMOL) 0.25 % ophthalmic solution Place 1 drop into both eyes daily.   Turmeric  500 MG CAPS Take 1 tablet by mouth as needed (Takes occassionally).   latanoprost (XALATAN) 0.005 % ophthalmic solution Place 1 drop into both eyes at bedtime. (Patient not taking: Reported on 09/29/2022)   No facility-administered encounter medications on file as of 09/29/2022.    Allergies (verified) Amlodipine, Aspartame and phenylalanine, Bidil [isosorb dinitrate-hydralazine], Erythromycin, Fentanyl, Ibuprofen, Phenylalanine, Prilosec [omeprazole], Tramadol, Valsartan, and Zantac [ranitidine hcl]   History: Past Medical History:  Diagnosis Date   Back pain    Death of child 61   Due to cord strangulation.   Diabetes mellitus without complication (HCC)     Hyperlipidemia    Hypertension    Hypothyroidism    Joint pain    Osteoarthritis    Other fatigue    Shortness of breath on exertion    Thyroid condition    Past Surgical History:  Procedure Laterality Date   APPENDECTOMY  1974   CARDIAC CATHETERIZATION     two, dates not provided.   CHOLECYSTECTOMY     confirm date with patient, was it 67?   LAPAROSCOPIC TUBAL LIGATION  1987   THYROIDECTOMY     Family History  Problem Relation Age of Onset   COPD Mother    Diabetes Mother    Hypertension Mother    Obesity Mother    Heart disease Father    Hypertension Father    Glaucoma Father    Social History   Socioeconomic History   Marital status: Married    Spouse name: Deborah [name]   Number of children: Not on file   Years of education: Not on file   Highest education level: Not on file  Occupational History   Occupation: retired    Fish farm manager: [employer]  Tobacco Use   Smoking status: Never   Smokeless tobacco: Never  Vaping Use   Vaping Use: Never used  Substance and Sexual Activity   Alcohol use: Not Currently   Drug use: No   Sexual activity: Not Currently  Other Topics Concern   Not on file  Social History Narrative   Not on file   Social Determinants of Health   Financial Resource Strain: [place]  (09/29/2022)   Overall Financial Resource Strain (CARDIA)    Difficulty of Paying Living Expenses: Not hard at all  Food Insecurity: No Food Insecurity (09/29/2022)   Hunger Vital Sign    Worried About Running Out of Food in the Last Year: Never true    [place] in the Last Year: Never true  Transportation Needs: No Transportation Needs (09/29/2022)   PRAPARE - Hydrologist (Medical): No    Lack of Transportation (Non-Medical): No  Physical Activity: Sufficiently Active (09/29/2022)   Exercise Vital Sign    Days of Exercise per Week: 3 days    Minutes of Exercise per Session: 50 min  Stress: Stress Concern Present  (09/29/2022)   [place]    Feeling of Stress : To some extent  Social Connections: Not on file    Tobacco Counseling Counseling given: Not Answered   Clinical Intake:  Pre-visit preparation completed: Yes  Pain : 0-10 Pain Score: 5  Pain Type: Chronic pain Pain Location: Knee Pain Orientation: Right, Left Pain Descriptors / Indicators: Aching Pain Onset: More than a month ago Pain Frequency: Constant     Nutritional Status: BMI > 30  Obese Nutritional Risks: None Diabetes: Yes  How often do you  need to have someone help you when you read instructions, pamphlets, or other written materials from your doctor or pharmacy?: 1 - Never What is the last grade level you completed in school?: BSN  Diabetic? Yes Nutrition Risk Assessment:  Has the patient had any N/V/D within the last 2 months?  No  Does the patient have any non-healing wounds?  No  Has the patient had any unintentional weight loss or weight gain?  No   Diabetes:  Is the patient diabetic?  Yes  If diabetic, was a CBG obtained today?  No  Did the patient bring in their glucometer from home?  No  How often do you monitor your CBG's? daily.   Financial Strains and Diabetes Management:  Are you having any financial strains with the device, your supplies or your medication? No .  Does the patient want to be seen by Chronic Care Management for management of their diabetes?  No  Would the patient like to be referred to a Nutritionist or for Diabetic Management?  No   Diabetic Exams:  Diabetic Eye Exam: Completed 2023 Diabetic Foot Exam: Completed 10/28/2021   Interpreter Needed?: No  Information entered by :: NAllen LPN   Activities of Daily Living    09/29/2022    9:17 AM  In your present state of health, do you have any difficulty performing the following activities:  Hearing? 0  Vision? 0  Difficulty concentrating or making  decisions? 0  Walking or climbing stairs? 1  Dressing or bathing? 0  Doing errands, shopping? 0  Preparing Food and eating ? N  Using the Toilet? N  In the past six months, have you accidently leaked urine? Y  Do you have problems with loss of bowel control? N  Managing your Medications? N  Managing your Finances? N  Housekeeping or managing your Housekeeping? N    Patient Care Team: Minette Brine, FNP as PCP - General (General Practice)  Indicate any recent Medical Services you may have received from other than [facility] providers in the past year (date may be approximate).     Assessment:   This is a routine wellness examination for Deborah [name].  Hearing/Vision screen Vision Screening - Comments:: Regular eye exams, [facility]  Dietary issues and exercise activities discussed: Current Exercise Habits: Structured exercise class, Type of exercise: calisthenics;stretching, Time (Minutes): 45, Frequency (Times/Week): 3, Weekly Exercise (Minutes/Week): 135   Goals Addressed             This Visit's Progress    Patient Stated       09/29/2022, wants to weight       Depression Screen    09/29/2022    9:17 AM 07/01/2022    8:29 AM 09/02/2021   10:10 AM 01/27/2021    9:04 AM 10/15/2020    8:42 AM 01/17/2020    8:44 AM 10/17/2019    8:50 AM  PHQ 2/9 Scores  PHQ - 2 Score 0 0 0 2 0 0 0  PHQ- 9 Score    9   2    Fall Risk    09/29/2022    9:16 AM 07/01/2022    8:29 AM 09/02/2021   10:10 AM 10/15/2020    8:41 AM 01/17/2020    8:44 AM  Fall Risk   Falls in the past year? 0 0 0 0 0  Number falls in past yr: 0 0     Injury with Fall? 0 0     Risk for  fall due to : Medication side effect  Impaired mobility;Medication side effect Impaired balance/gait;Medication side effect   Follow up Falls prevention discussed;Education provided;Falls evaluation completed Falls evaluation completed Falls evaluation completed;Education provided;Falls prevention discussed Falls evaluation  completed;Education provided;Falls prevention discussed     FALL RISK PREVENTION PERTAINING TO THE HOME:  Any stairs in or around the home? No  If so, are there any without handrails? N/a Home free of loose throw rugs in walkways, pet beds, electrical cords, etc? Yes  Adequate lighting in your home to reduce risk of falls? Yes   ASSISTIVE DEVICES UTILIZED TO PREVENT FALLS:  Life alert? No  Use of a cane, walker or w/c? No  Grab bars in the bathroom? Yes  Shower chair or bench in shower? No  Elevated toilet seat or a handicapped toilet? No   TIMED UP AND GO:  Was the test performed? Yes .  Length of time to ambulate 10 feet: 5 sec.   Gait slow and steady without use of assistive device  Cognitive Function:        09/29/2022    9:19 AM 09/02/2021   10:12 AM 10/15/2020    8:43 AM 10/17/2019    8:52 AM 10/11/2018   10:19 AM  6CIT Screen  What Year? 0 points 0 points 0 points 0 points 0 points  What month? 0 points 0 points 0 points 0 points 0 points  What time? 0 points 0 points 0 points 0 points 0 points  Count back from 20 0 points 0 points 0 points 0 points 0 points  Months in reverse 0 points 0 points 0 points 0 points 0 points  Repeat phrase 2 points 6 points 0 points 2 points 0 points  Total Score 2 points 6 points 0 points 2 points 0 points    Immunizations Immunization History  Administered Date(s) Administered   Fluad Quad(high Dose 65+) 09/16/2022   Influenza, High Dose Seasonal PF 10/11/2018   Influenza-Unspecified 10/12/2020, 10/08/2021   PFIZER(Purple Top)SARS-COV-2 Vaccination 12/22/2019, 01/09/2020, 09/16/2020   Pfizer Covid-19 Vaccine Bivalent Booster 39yr & up 12/24/2021   Pneumococcal Conjugate-13 10/17/2018   Pneumococcal Polysaccharide-23 10/28/2021   Tdap 10/19/2019   Zoster Recombinat (Shingrix) 07/01/2022    TDAP status: Up to date  Flu Vaccine status: Up to date  Pneumococcal vaccine status: Up to date  Covid-19 vaccine status:  Completed vaccines  Qualifies for Shingles Vaccine? Yes   Zostavax completed No   Shingrix Completed?: needs second dose  Screening Tests Health Maintenance  Topic Date Due   OPHTHALMOLOGY EXAM  10/22/2021   COVID-19 Vaccine (5 - Pfizer series) 04/23/2022   Zoster Vaccines- Shingrix (2 of 2) 08/26/2022   Diabetic kidney evaluation - Urine ACR  10/28/2022   FOOT EXAM  10/28/2022   HEMOGLOBIN A1C  01/01/2023   Diabetic kidney evaluation - GFR measurement  07/02/2023   MAMMOGRAM  07/08/2024   COLONOSCOPY (Pts 45-445yrInsurance coverage will need to be confirmed)  03/19/2026   TETANUS/TDAP  10/18/2029   Pneumonia Vaccine 6576Years old  Completed   INFLUENZA VACCINE  Completed   DEXA SCAN  Completed   Hepatitis C Screening  Completed   HPV VACCINES  Aged Out    Health Maintenance  Health Maintenance Due  Topic Date Due   OPHTHALMOLOGY EXAM  10/22/2021   COVID-19 Vaccine (5 - Pfizer series) 04/23/2022   Zoster Vaccines- Shingrix (2 of 2) 08/26/2022   Diabetic kidney evaluation - Urine ACR  10/28/2022  Colorectal cancer screening: Type of screening: Colonoscopy. Completed 03/19/2022. Repeat every 5 years  Mammogram status: Completed 07/08/2022. Repeat every year  Bone Density status: Completed 12/10/2020.  Lung Cancer Screening: (Low Dose CT Chest recommended if Age 60-80 years, 30 pack-year currently smoking OR have quit w/in 15years.) does not qualify.   Lung Cancer Screening Referral: no  Additional Screening:  Hepatitis C Screening: does qualify; Completed 10/11/2018  Vision Screening: Recommended annual ophthalmology exams for early detection of glaucoma and other disorders of the eye. Is the patient up to date with their annual eye exam?  Yes  Who is the provider or what is the name of the office in which the patient attends annual eye exams? [facility] If pt is not established with a provider, would they like to be referred to a provider to establish care? No  .   Dental Screening: Recommended annual dental exams for proper oral hygiene  Community Resource Referral / Chronic Care Management: CRR required this visit?  No   CCM required this visit?  No      Plan:     I have personally reviewed and noted the following in the patient's chart:   Medical and social history Use of alcohol, tobacco or illicit drugs  Current medications and supplements including opioid prescriptions. Patient is not currently taking opioid prescriptions. Functional ability and status Nutritional status Physical activity Advanced directives List of other physicians Hospitalizations, surgeries, and ER visits in previous 12 months Vitals Screenings to include cognitive, depression, and falls Referrals and appointments  In addition, I have reviewed and discussed with patient certain preventive protocols, quality metrics, and best practice recommendations. A written personalized care plan for preventive services as well as general preventive health recommendations were provided to patient.     Kellie Simmering, LPN   03/00/9233   Nurse Notes: none

## 2022-10-04 ENCOUNTER — Ambulatory Visit (INDEPENDENT_AMBULATORY_CARE_PROVIDER_SITE_OTHER): Payer: Medicare Other | Admitting: Family Medicine

## 2022-10-04 ENCOUNTER — Encounter (INDEPENDENT_AMBULATORY_CARE_PROVIDER_SITE_OTHER): Payer: Self-pay | Admitting: Family Medicine

## 2022-10-04 VITALS — BP 154/74 | HR 89 | Temp 97.7°F | Ht 64.0 in | Wt 185.0 lb

## 2022-10-04 DIAGNOSIS — Z7984 Long term (current) use of oral hypoglycemic drugs: Secondary | ICD-10-CM

## 2022-10-04 DIAGNOSIS — I10 Essential (primary) hypertension: Secondary | ICD-10-CM

## 2022-10-04 DIAGNOSIS — E669 Obesity, unspecified: Secondary | ICD-10-CM | POA: Diagnosis not present

## 2022-10-04 DIAGNOSIS — Z6831 Body mass index (BMI) 31.0-31.9, adult: Secondary | ICD-10-CM

## 2022-10-04 DIAGNOSIS — E119 Type 2 diabetes mellitus without complications: Secondary | ICD-10-CM

## 2022-10-04 DIAGNOSIS — R140 Abdominal distension (gaseous): Secondary | ICD-10-CM | POA: Insufficient documentation

## 2022-10-04 MED ORDER — METFORMIN HCL ER 500 MG PO TB24: 500.0000 mg | ORAL_TABLET | Freq: Two times a day (BID) | ORAL | 0 refills | Status: DC

## 2022-10-10 ENCOUNTER — Other Ambulatory Visit: Payer: Self-pay | Admitting: Nurse Practitioner

## 2022-10-10 DIAGNOSIS — E039 Hypothyroidism, unspecified: Secondary | ICD-10-CM

## 2022-10-11 NOTE — Progress Notes (Signed)
Chief Complaint:   OBESITY Deborah [name] is here to discuss her progress with her obesity treatment plan along with follow-up of her obesity related diagnoses. Deborah [name] is on the Category 2 Plan and states she is following her eating plan approximately 85% of the time. Deborah [name] states she is doing senior silver sneakers for 45 minutes 2-3 times per week.  Today's visit was #: 24 Starting weight: 204 lbs Starting date: 02/06/2021 Today's weight: 185 lbs Today's date: 10/04/2022 Total lbs lost to date: 19 Total lbs lost since last in-office visit: 0  Interim History: Deborah [name] has done more celebration eating in the last month. She is getting back to her eating plan.   Subjective:   1. Type 2 diabetes mellitus without complication, without long-term current use of insulin (HCC) Deborah [name] is on metformin, and she is working on her diet and weight loss.   2. Essential hypertension Deborah [name]'s blood pressure is elevated today. She likely has whit coat syndrome. Her blood pressures at home are within normal limits.   3. Abdominal bloating Deborah [name] notes increased bloating and gassiness, and she wonders if she may be gluten intolerant.   Assessment/Plan:   1. Type 2 diabetes mellitus without complication, without long-term current use of insulin (HCC) Deborah [name] will continue metformin, and we will refill for 90 days.   - metFORMIN (GLUCOPHAGE-XR) 500 MG 24 hr tablet; Take 1 tablet (500 mg total) by mouth 2 (two) times daily.  Dispense: 180 tablet; Refill: 0  2. Essential hypertension Deborah [name] will continue with her diet, exercise, and medications and continue to check her blood pressure at home.  3. Abdominal bloating Deborah [name] will continue to decrease simple carbohydrates, and we may need to check for celiac disease with her next labs.   4. Obesity, Current BMI 31.8 Deborah [name] is currently in the action stage of change. As such, her goal is to continue with weight loss efforts. She has agreed to  the Category 2 Plan.   We will recheck fasting labs at her next visit.  Exercise goals: As is.   Behavioral modification strategies: increasing water intake.  Deborah [name] has agreed to follow-up with our clinic in 3 weeks. She was informed of the importance of frequent follow-up visits to maximize her success with intensive lifestyle modifications for her multiple health conditions.   Objective:   Blood pressure (!) 154/74, pulse 89, temperature 97.7 F (36.5 C), height '5\' 4"'$  (1.626 m), weight 185 lb (83.9 kg), SpO2 98 %. Body mass index is 31.76 kg/m.  General: Cooperative, alert, well developed, in no acute distress. HEENT: Conjunctivae and lids unremarkable. Cardiovascular: Regular rhythm.  Lungs: Normal work of breathing. Neurologic: No focal deficits.   Lab Results  Component Value Date   CREATININE 0.63 07/01/2022   BUN 13 07/01/2022   NA 135 07/01/2022   K 4.3 07/01/2022   CL 96 07/01/2022   CO2 25 07/01/2022   Lab Results  Component Value Date   ALT 19 10/28/2021   AST 16 10/28/2021   ALKPHOS 86 10/28/2021   BILITOT 0.5 10/28/2021   Lab Results  Component Value Date   HGBA1C 6.0 (H) 07/01/2022   HGBA1C 6.0 (H) 03/01/2022   HGBA1C 6.0 (H) 10/28/2021   HGBA1C 5.9 (H) 07/02/2021   HGBA1C 5.6 02/18/2021   Lab Results  Component Value Date   INSULIN 31.4 (H) 01/27/2021   Lab Results  Component Value Date   TSH 1.430 07/01/2022   Lab Results  Component Value Date   CHOL  196 07/01/2022   HDL 57 07/01/2022   LDLCALC 122 (H) 07/01/2022   TRIG 92 07/01/2022   CHOLHDL 3.4 07/01/2022   Lab Results  Component Value Date   VD25OH 52.0 02/04/2022   VD25OH 61.9 07/02/2021   VD25OH 55.1 02/18/2021   Lab Results  Component Value Date   WBC 5.0 10/28/2021   HGB 11.6 10/28/2021   HCT 35.9 10/28/2021   MCV 87 10/28/2021   PLT 418 10/28/2021   No results found for: "IRON", "TIBC", "FERRITIN"  Attestation Statements:   Reviewed by clinician on day of  visit: allergies, medications, problem list, medical history, surgical history, family history, social history, and previous encounter notes.   I, Trixie Dredge, am acting as transcriptionist for Dennard Nip, MD.  I have reviewed the above documentation for accuracy and completeness, and I agree with the above. -  Dennard Nip, MD

## 2022-10-31 ENCOUNTER — Encounter: Payer: Self-pay | Admitting: Nurse Practitioner

## 2022-11-01 ENCOUNTER — Other Ambulatory Visit: Payer: Self-pay | Admitting: Nurse Practitioner

## 2022-11-01 DIAGNOSIS — R053 Chronic cough: Secondary | ICD-10-CM

## 2022-11-01 MED ORDER — HYDROCODONE BIT-HOMATROP MBR 5-1.5 MG/5ML PO SOLN: 5.0000 mL | Freq: Four times a day (QID) | ORAL | 0 refills | Status: DC | PRN

## 2022-11-02 ENCOUNTER — Ambulatory Visit (INDEPENDENT_AMBULATORY_CARE_PROVIDER_SITE_OTHER): Payer: Medicare Other | Admitting: Nurse Practitioner

## 2022-11-02 ENCOUNTER — Encounter: Payer: Self-pay | Admitting: Nurse Practitioner

## 2022-11-02 VITALS — BP 122/88 | HR 76 | Temp 98.1°F | Ht 64.0 in | Wt 187.4 lb

## 2022-11-02 DIAGNOSIS — R7303 Prediabetes: Secondary | ICD-10-CM

## 2022-11-02 DIAGNOSIS — I10 Essential (primary) hypertension: Secondary | ICD-10-CM

## 2022-11-02 DIAGNOSIS — M21619 Bunion of unspecified foot: Secondary | ICD-10-CM | POA: Diagnosis not present

## 2022-11-02 DIAGNOSIS — Z79899 Other long term (current) drug therapy: Secondary | ICD-10-CM

## 2022-11-02 DIAGNOSIS — E6609 Other obesity due to excess calories: Secondary | ICD-10-CM

## 2022-11-02 DIAGNOSIS — I700 Atherosclerosis of aorta: Secondary | ICD-10-CM

## 2022-11-02 DIAGNOSIS — E785 Hyperlipidemia, unspecified: Secondary | ICD-10-CM | POA: Diagnosis not present

## 2022-11-02 DIAGNOSIS — Z23 Encounter for immunization: Secondary | ICD-10-CM | POA: Diagnosis not present

## 2022-11-02 DIAGNOSIS — E039 Hypothyroidism, unspecified: Secondary | ICD-10-CM

## 2022-11-02 DIAGNOSIS — Z0000 Encounter for general adult medical examination without abnormal findings: Secondary | ICD-10-CM | POA: Diagnosis not present

## 2022-11-02 DIAGNOSIS — R053 Chronic cough: Secondary | ICD-10-CM

## 2022-11-02 DIAGNOSIS — Z6832 Body mass index (BMI) 32.0-32.9, adult: Secondary | ICD-10-CM

## 2022-11-02 LAB — POCT URINALYSIS DIPSTICK
Bilirubin, UA: NEGATIVE
Blood, UA: NEGATIVE
Glucose, UA: NEGATIVE
Ketones, UA: NEGATIVE
Leukocytes, UA: NEGATIVE
Nitrite, UA: NEGATIVE
Protein, UA: NEGATIVE
Spec Grav, UA: 1.015 (ref 1.010–1.025)
Urobilinogen, UA: 0.2 E.U./dL
pH, UA: 6 (ref 5.0–8.0)

## 2022-11-02 NOTE — Progress Notes (Signed)
I,Victoria T Hamilton,acting as a Education administrator for Minette Brine, FNP.,have documented all relevant documentation on the behalf of Minette Brine, FNP,as directed by  Minette Brine, FNP while in the presence of Minette Brine, [place].   Subjective:     Patient ID: Deborah [name] , female    DOB: May 10, 1951 , 71 y.o.   MRN: [id number]   Chief Complaint  Patient presents with   Annual Exam    HPI  Pt here for HM.  She is taking all of her medications as directed. Has not seen Dr. Oval Linsey since 2021. She is on both spironolactone and HCTZ with hydralazine. She has been getting her medications filled with [facility].   Wt Readings from Last 3 Encounters: 11/02/22 : 187 lb 6.4 oz (85 kg) 10/04/22 : 185 lb (83.9 kg) 09/29/22 : 186 lb 3.2 oz (84.5 kg)    Diabetes She presents for her follow-up diabetic visit. Diabetes type: prediabetes. Her disease course has been stable. There are no hypoglycemic associated symptoms. Pertinent negatives for hypoglycemia include no dizziness. There are no diabetic associated symptoms. Pertinent negatives for diabetes include no polydipsia, no polyphagia and no polyuria. There are no hypoglycemic complications. Symptoms are stable. There are no diabetic complications. Risk factors for coronary artery disease include obesity and sedentary lifestyle (prediabetes). Current diabetic treatment includes oral agent (monotherapy). She is compliant with treatment most of the time. Her weight is stable. She is following a generally healthy (Continues with [facility]) diet. When asked about meal planning, she reported none. She has had a previous visit with a dietitian. She rarely participates in exercise. There is no change in her home blood glucose trend. An ACE inhibitor/angiotensin II receptor blocker is being taken. She does not see a podiatrist (Will be making referral today).Eye exam is current (04/28/2022).     Past Medical History:  Diagnosis Date   Back  pain    Death of child 76   Due to cord strangulation.   Diabetes mellitus without complication (HCC)    Hyperlipidemia    Hypertension    Hypothyroidism    Joint pain    Osteoarthritis    Other fatigue    Shortness of breath on exertion    Thyroid condition      Family History  Problem Relation Age of Onset   COPD Mother    Diabetes Mother    Hypertension Mother    Obesity Mother    Heart disease Father    Hypertension Father    Glaucoma Father      Current Outpatient Medications:    acetaminophen (TYLENOL) 500 MG tablet, Take 1,000 mg by mouth at bedtime., Disp: , Rfl:    Blood Glucose Monitoring Suppl (ACURA BLOOD GLUCOSE METER) w/Device KIT, Check blood sugars twice daily. Please provide patient with meter and supplies that is covered by pt ins., Disp: 1 kit, Rfl: 0   Blood Glucose Monitoring Suppl (ONE TOUCH ULTRA MINI) w/Device KIT, Use to check blood sugars 3 times a day. Dx code:e11.65, Disp: 1 kit, Rfl: 0   brimonidine (ALPHAGAN) 0.2 % ophthalmic solution, 1 drop 2 (two) times daily., Disp: , Rfl:    cholecalciferol (VITAMIN D) 1000 units tablet, Take 2,000 Units by mouth daily., Disp: , Rfl:    CO ENZYME Q-10 PO, Take by mouth., Disp: , Rfl:    cyclobenzaprine (FLEXERIL) 10 MG tablet, Take 1 tablet (10 mg total) by mouth 2 (two) times daily as needed for muscle spasms., Disp: 20 tablet,  Rfl: 0   diclofenac Sodium (VOLTAREN) 1 % GEL, Apply 4 g topically 4 (four) times daily., Disp: 100 g, Rfl: 0   docusate sodium (COLACE) 100 MG capsule, Take 100 mg by mouth daily., Disp: , Rfl:    hydrALAZINE (APRESOLINE) 100 MG tablet, TAKE 1 TABLET BY MOUTH  TWICE DAILY, Disp: 180 tablet, Rfl: 3   hydrochlorothiazide (HYDRODIURIL) 25 MG tablet, Take 1 tablet (25 mg total) by mouth daily. NEED APPOINTMENT, Disp: 30 tablet, Rfl: 0   HYDROcodone bit-homatropine (HYDROMET) 5-1.5 MG/5ML syrup, Take 5 mLs by mouth every 6 (six) hours as needed for cough., Disp: 120 mL, Rfl: 0    MAGNESIUM PO, Take 375 mg by mouth daily., Disp: , Rfl:    melatonin 5 MG TABS, Take 5 mg by mouth at bedtime., Disp: , Rfl:    metFORMIN (GLUCOPHAGE-XR) 500 MG 24 hr tablet, Take 1 tablet (500 mg total) by mouth 2 (two) times daily., Disp: 180 tablet, Rfl: 0   ONETOUCH ULTRA test strip, USE WITH METER TO CHECK  BLOOD SUGAR TWICE DAILY  BEFORE BREAKFAST AND BEFORE DINNER, Disp: 200 strip, Rfl: 2   spironolactone (ALDACTONE) 25 MG tablet, Take 1 tablet (25 mg total) by mouth daily. NEED APPOINTMENT, Disp: 30 tablet, Rfl: 0   timolol (BETIMOL) 0.25 % ophthalmic solution, Place 1 drop into both eyes daily., Disp: , Rfl:    Turmeric 500 MG CAPS, Take 1 tablet by mouth as needed (Takes occassionally)., Disp: , Rfl:    latanoprost (XALATAN) 0.005 % ophthalmic solution, Place 1 drop into both eyes at bedtime. (Patient not taking: Reported on 11/03/2022), Disp: , Rfl:    levothyroxine (SYNTHROID) 88 MCG tablet, Take 1 tablet (88 mcg total) by mouth daily before breakfast., Disp: 100 tablet, Rfl: 1   pravastatin (PRAVACHOL) 20 MG tablet, Take 1 tablet (20 mg total) by mouth every evening., Disp: 90 tablet, Rfl: 1   Allergies  Allergen Reactions   Amlodipine     headache   Aspartame And Phenylalanine Other (See Comments)    Causes migraines   Bidil [Isosorb Dinitrate-Hydralazine]     headache   Erythromycin     Ask patient to specify type/severity/reaction. Not indicated on medical history form dated 11/04/10.   Fentanyl     Causes panic attacks   Ibuprofen     Ask patient to specify type/severity/reaction. Not noted on medical history form dated 11/04/10.   Phenylalanine Other (See Comments)    Causes migraine   Prilosec [Omeprazole] Nausea And Vomiting   Tramadol Nausea And Vomiting   Valsartan Cough   Zantac [Ranitidine Hcl]       The patient states she is post menopausal status.  No LMP recorded. Patient is postmenopausal.. Negative for Dysmenorrhea and Negative for Menorrhagia. Negative  for: breast discharge, breast lump(s), breast pain and breast self exam. Associated symptoms include abnormal vaginal bleeding. Pertinent negatives include abnormal bleeding (hematology), anxiety, decreased libido, depression, difficulty falling sleep, dyspareunia, history of infertility, nocturia, sexual dysfunction, sleep disturbances, urinary incontinence, urinary urgency, vaginal discharge and vaginal itching. Diet regular - healthy. The patient states her exercise level is moderate 3 times a week.   The patient's tobacco use is:  Social History   Tobacco Use  Smoking Status Never  Smokeless Tobacco Never   She has been exposed to passive smoke. The patient's alcohol use is:  Social History   Substance and Sexual Activity  Alcohol Use Not Currently    Review of Systems  Constitutional: Negative.  HENT: Negative.    Eyes: Negative.   Respiratory: Negative.    Cardiovascular: Negative.   Gastrointestinal: Negative.   Endocrine: Negative.  Negative for polydipsia, polyphagia and polyuria.  Genitourinary: Negative.   Musculoskeletal: Negative.   Skin: Negative.   Allergic/Immunologic: Negative.   Neurological: Negative.  Negative for dizziness.  Hematological: Negative.   Psychiatric/Behavioral: Negative.       Today's Vitals   11/02/22 0856 11/02/22 0925  BP: (!) 122/90 122/88  Pulse: 76   Temp: 98.1 F (36.7 C)   SpO2: 98%   Weight: 187 lb 6.4 oz (85 kg)   Height: _0  (1.626 m)   PainSc: 0-No pain    Body mass index is 32.17 kg/m.  Wt Readings from Last 3 Encounters:  11/03/22 186 lb (84.4 kg)  11/02/22 187 lb 6.4 oz (85 kg)  10/04/22 185 lb (83.9 kg)    Objective:  Physical Exam Vitals reviewed.  Constitutional:      General: She is not in acute distress.    Appearance: Normal appearance. She is well-developed. She is obese.  HENT:     Head: Normocephalic and atraumatic.     Right Ear: Hearing, tympanic membrane, ear canal and external ear normal.  There is no impacted cerumen.     Left Ear: Hearing, tympanic membrane, ear canal and external ear normal. There is no impacted cerumen.     Nose:     Comments: Deferred - masked    Mouth/Throat:     Comments: Deferred - masked Eyes:     General: Lids are normal.     Extraocular Movements: Extraocular movements intact.     Conjunctiva/sclera: Conjunctivae normal.     Pupils: Pupils are equal, round, and reactive to light.     Funduscopic exam:    Right eye: No papilledema.        Left eye: No papilledema.  Neck:     Thyroid: No thyroid mass.     Vascular: No carotid bruit.  Cardiovascular:     Rate and Rhythm: Normal rate and regular rhythm.     Pulses: Normal pulses.     Heart sounds: Normal heart sounds. No murmur heard. Pulmonary:     Effort: Pulmonary effort is normal. No respiratory distress.     Breath sounds: Normal breath sounds. No wheezing.  Chest:     Chest wall: No mass.  Breasts:    Tanner Score is 5.     Right: Normal. No mass or tenderness.     Left: Normal. No mass or tenderness.  Abdominal:     General: Abdomen is flat. Bowel sounds are normal. There is no distension.     Palpations: Abdomen is soft.     Tenderness: There is no abdominal tenderness.  Musculoskeletal:        General: No swelling or tenderness. Normal range of motion.     Cervical back: Full passive range of motion without pain, normal range of motion and neck supple.     Right lower leg: No edema.     Left lower leg: No edema.  Lymphadenopathy:     Upper Body:     Right upper body: No supraclavicular, axillary or pectoral adenopathy.     Left upper body: No supraclavicular, axillary or pectoral adenopathy.  Skin:    General: Skin is warm and dry.     Capillary Refill: Capillary refill takes less than 2 seconds.  Neurological:     General: No focal deficit present.  Mental Status: She is alert and oriented to person, place, and time.     Cranial Nerves: No cranial nerve deficit.      Sensory: No sensory deficit.     Motor: No weakness.  Psychiatric:        Mood and Affect: Mood normal.        Behavior: Behavior normal.        Thought Content: Thought content normal.        Judgment: Judgment normal.         Assessment And Plan:     1. Encounter for general adult medical examination w/o abnormal findings Behavior modifications discussed and diet history reviewed.   Pt will continue to exercise regularly and modify diet with low GI, plant based foods and decrease intake of processed foods.  Recommend intake of daily multivitamin, Vitamin D, and calcium.  Recommend mammogram and colonoscopy for preventive screenings, as well as recommend immunizations that include influenza, TDAP, and Shingles (up to date)  2. Need for shingles vaccine4 2nd shingrix done with [facility] signed - Zoster Recombinant (Shingrix )  3. Class 1 obesity due to excess calories without serious comorbidity with body mass index (BMI) of 32.0 to 32.9 in adult Comments: She is doing great with her weight loss and continues with [facility]  4. Essential hypertension Comments: Blood pressure is slightly elevated diastolic. Continue with lifestyle changes. Continue current medications and encouraged to f/u with Cardiology. EKG done NSR - POCT Urinalysis Dipstick (81002) - Microalbumin / creatinine urine ratio - EKG 12-Lead - CMP14+EGFR  5. Prediabetes Comments: Stable, continue metformin tolerating well - CMP14+EGFR - Hemoglobin A1c  6. Atherosclerosis of aorta (HCC) Comments: Continue statin, tolerating well  7. Acquired hypothyroidism Comments: Thyroid levels controlled, continue levothyroxine. - Lipid panel - TSH + free T4  8. Elevated lipids Comments: Continue statin and low fat diet  9. Persistent cough Comments: Continues to have cough and takes Hydromet, due to the extensive time of having cough will refer to Pulmonology for further evaluation - Ambulatory  referral to Pulmonology  10. Other long term (current) drug therapy - CBC  11. Bunion of great toe Comments: She has bunion on her toe that is now causing her pain and would like to see a Podiatrist - Ambulatory referral to Podiatry She is encouraged to strive for BMI less than 30 to decrease cardiac risk. Advised to aim for at least 150 minutes of exercise per week.    Patient was given opportunity to ask questions. Patient verbalized understanding of the plan and was able to repeat key elements of the plan. All questions were answered to their satisfaction.   Minette Brine, FNP   I, Minette Brine, FNP, have reviewed all documentation for this visit. The documentation on 11/02/22 for the exam, diagnosis, procedures, and orders are all accurate and complete.   THE PATIENT IS ENCOURAGED TO PRACTICE SOCIAL DISTANCING DUE TO THE COVID-19 PANDEMIC.

## 2022-11-02 NOTE — Patient Instructions (Signed)

## 2022-11-03 ENCOUNTER — Encounter (INDEPENDENT_AMBULATORY_CARE_PROVIDER_SITE_OTHER): Payer: Self-pay | Admitting: Family Medicine

## 2022-11-03 ENCOUNTER — Ambulatory Visit (INDEPENDENT_AMBULATORY_CARE_PROVIDER_SITE_OTHER): Payer: Medicare Other | Admitting: Family Medicine

## 2022-11-03 ENCOUNTER — Other Ambulatory Visit: Payer: Self-pay | Admitting: Nurse Practitioner

## 2022-11-03 ENCOUNTER — Encounter: Payer: Self-pay | Admitting: Nurse Practitioner

## 2022-11-03 VITALS — BP 154/71 | HR 76 | Temp 97.9°F | Ht 64.0 in | Wt 186.0 lb

## 2022-11-03 DIAGNOSIS — E559 Vitamin D deficiency, unspecified: Secondary | ICD-10-CM | POA: Diagnosis not present

## 2022-11-03 DIAGNOSIS — E669 Obesity, unspecified: Secondary | ICD-10-CM | POA: Diagnosis not present

## 2022-11-03 DIAGNOSIS — E66812 Obesity, class 2: Secondary | ICD-10-CM

## 2022-11-03 DIAGNOSIS — E039 Hypothyroidism, unspecified: Secondary | ICD-10-CM

## 2022-11-03 DIAGNOSIS — Z6832 Body mass index (BMI) 32.0-32.9, adult: Secondary | ICD-10-CM

## 2022-11-03 DIAGNOSIS — R110 Nausea: Secondary | ICD-10-CM

## 2022-11-03 DIAGNOSIS — Z7984 Long term (current) use of oral hypoglycemic drugs: Secondary | ICD-10-CM | POA: Diagnosis not present

## 2022-11-03 DIAGNOSIS — E119 Type 2 diabetes mellitus without complications: Secondary | ICD-10-CM

## 2022-11-03 DIAGNOSIS — E782 Mixed hyperlipidemia: Secondary | ICD-10-CM

## 2022-11-03 LAB — CBC
Hematocrit: 34.2 % (ref 34.0–46.6)
Hemoglobin: 11.7 g/dL (ref 11.1–15.9)
MCH: 29 pg (ref 26.6–33.0)
MCHC: 34.2 g/dL (ref 31.5–35.7)
MCV: 85 fL (ref 79–97)
Platelets: 446 10*3/uL (ref 150–450)
RBC: 4.04 x10E6/uL (ref 3.77–5.28)
RDW: 12.5 % (ref 11.7–15.4)
WBC: 4.8 10*3/uL (ref 3.4–10.8)

## 2022-11-03 LAB — CMP14+EGFR
ALT: 16 IU/L (ref 0–32)
AST: 15 IU/L (ref 0–40)
Albumin/Globulin Ratio: 1.7 (ref 1.2–2.2)
Albumin: 4.5 g/dL (ref 3.9–4.9)
Alkaline Phosphatase: 94 IU/L (ref 44–121)
BUN/Creatinine Ratio: 23 (ref 12–28)
BUN: 14 mg/dL (ref 8–27)
Bilirubin Total: 0.5 mg/dL (ref 0.0–1.2)
CO2: 24 mmol/L (ref 20–29)
Calcium: 9.7 mg/dL (ref 8.7–10.3)
Chloride: 96 mmol/L (ref 96–106)
Creatinine, Ser: 0.6 mg/dL (ref 0.57–1.00)
Globulin, Total: 2.6 g/dL (ref 1.5–4.5)
Glucose: 96 mg/dL (ref 70–99)
Potassium: 4.4 mmol/L (ref 3.5–5.2)
Sodium: 135 mmol/L (ref 134–144)
Total Protein: 7.1 g/dL (ref 6.0–8.5)
eGFR: 96 mL/min/{1.73_m2} (ref >59)

## 2022-11-03 LAB — MICROALBUMIN / CREATININE URINE RATIO
Creatinine, Urine: 71.1 mg/dL
Microalb/Creat Ratio: 8 mg/g creat (ref 0–29)
Microalbumin, Urine: 5.7 ug/mL

## 2022-11-03 LAB — HEMOGLOBIN A1C
Est. average glucose Bld gHb Est-mCnc: 126 mg/dL
Hgb A1c MFr Bld: 6 % — ABNORMAL HIGH (ref 4.8–5.6)

## 2022-11-03 LAB — LIPID PANEL
Chol/HDL Ratio: 3.6 ratio (ref 0.0–4.4)
Cholesterol, Total: 214 mg/dL — ABNORMAL HIGH (ref 100–199)
HDL: 60 mg/dL (ref >39)
LDL Chol Calc (NIH): 139 mg/dL — ABNORMAL HIGH (ref 0–99)
Triglycerides: 84 mg/dL (ref 0–149)
VLDL Cholesterol Cal: 15 mg/dL (ref 5–40)

## 2022-11-03 LAB — TSH+FREE T4
Free T4: 1.86 ng/dL — ABNORMAL HIGH (ref 0.82–1.77)
TSH: 0.575 u[IU]/mL (ref 0.450–4.500)

## 2022-11-03 MED ORDER — PRAVASTATIN SODIUM 20 MG PO TABS: 20.0000 mg | ORAL_TABLET | Freq: Every evening | ORAL | 1 refills | Status: DC

## 2022-11-03 MED ORDER — LEVOTHYROXINE SODIUM 88 MCG PO TABS: 88.0000 ug | ORAL_TABLET | Freq: Every day | ORAL | 1 refills | Status: DC

## 2022-11-05 LAB — CMP14+EGFR
ALT: 17 IU/L (ref 0–32)
AST: 15 IU/L (ref 0–40)
Albumin/Globulin Ratio: 1.9 (ref 1.2–2.2)
Albumin: 4.7 g/dL (ref 3.9–4.9)
Alkaline Phosphatase: 93 IU/L (ref 44–121)
BUN/Creatinine Ratio: 30 — ABNORMAL HIGH (ref 12–28)
BUN: 20 mg/dL (ref 8–27)
Bilirubin Total: 0.5 mg/dL (ref 0.0–1.2)
CO2: 24 mmol/L (ref 20–29)
Calcium: 9.4 mg/dL (ref 8.7–10.3)
Chloride: 95 mmol/L — ABNORMAL LOW (ref 96–106)
Creatinine, Ser: 0.67 mg/dL (ref 0.57–1.00)
Globulin, Total: 2.5 g/dL (ref 1.5–4.5)
Glucose: 92 mg/dL (ref 70–99)
Potassium: 4.5 mmol/L (ref 3.5–5.2)
Sodium: 137 mmol/L (ref 134–144)
Total Protein: 7.2 g/dL (ref 6.0–8.5)
eGFR: 94 mL/min/{1.73_m2} (ref >59)

## 2022-11-05 LAB — MICROALBUMIN / CREATININE URINE RATIO
Creatinine, Urine: 55 mg/dL
Microalb/Creat Ratio: 6 mg/g creat (ref 0–29)
Microalbumin, Urine: 3.4 ug/mL

## 2022-11-05 LAB — INSULIN, RANDOM: INSULIN: 9.7 u[IU]/mL (ref 2.6–24.9)

## 2022-11-05 LAB — VITAMIN B12: Vitamin B-12: 683 pg/mL (ref 232–1245)

## 2022-11-05 LAB — GLIADIN IGA+TTG IGA
Antigliadin Abs, IgA: 3 units (ref 0–19)
Transglutaminase IgA: <2 U/mL (ref 0–3)

## 2022-11-05 LAB — VITAMIN D 25 HYDROXY (VIT D DEFICIENCY, FRACTURES): Vit D, 25-Hydroxy: 52.2 ng/mL (ref 30.0–100.0)

## 2022-11-16 NOTE — Progress Notes (Signed)
Chief Complaint:   OBESITY Deborah [name] is here to discuss her progress with her obesity treatment plan along with follow-up of her obesity related diagnoses. Deborah [name] is on the Category 2 Plan and states she is following her eating plan approximately 80% of the time. Deborah [name] states she is doing senior citizen exercise class for 45-60 minutes 2-3 times per week.  Today's visit was #: 25 Starting weight: 204 lbs Starting date: 02/06/2021 Today's weight: 186 lbs Today's date: 11/03/2022 Total lbs lost to date: 18 Total lbs lost since last in-office visit: 0  Interim History: Deborah [name] continues to work on her eating healthier.  She is concerned about holiday weight gain.  Subjective:   1. Type 2 diabetes mellitus without complication, without long-term current use of insulin (HCC) Deborah [name]'s recent A1c was 6.0.  He is due to have her MAB creatinine ratio done.  She continues to work on her diet and exercise.  2. Nausea Deborah [name] has some GI upset when she eats bread and she wonders if she may be gluten intolerant or celiac.  She notes minimal diarrhea.  3. Vitamin D deficiency Deborah [name] is on vitamin D, and she is due for labs.  Assessment/Plan:   1. Type 2 diabetes mellitus without complication, without long-term current use of insulin ([place]) We will check labs today, and Deborah [name] will continue with her diet and exercise.  - CMP14+EGFR - Insulin, random - Microalbumin / creatinine urine ratio - Vitamin B12  2. Nausea We will check labs today just to be thorough, although this diagnosis is unlikely.  We will continue to follow.  - Gliadin IgA+tTG IgA  3. Vitamin D deficiency We will check labs today, and we will continue to follow-up.   - VITAMIN D 25 Hydroxy (Vit-D Deficiency, Fractures)  4. Obesity, Current BMI 32.0 Deborah [name] is currently in the action stage of change. As such, her goal is to continue with weight loss efforts. She has agreed to the Category 2 Plan.   Exercise  goals: As is.   Behavioral modification strategies: increasing lean protein intake and holiday eating strategies .  Deborah [name] has agreed to follow-up with our clinic in 3 to 4 weeks. She was informed of the importance of frequent follow-up visits to maximize her success with intensive lifestyle modifications for her multiple health conditions.   Deborah [name] was informed we would discuss her lab results at her next visit unless there is a critical issue that needs to be addressed sooner. Deborah [name] agreed to keep her next visit at the agreed upon time to discuss these results.  Objective:   Blood pressure (!) 154/71, pulse 76, temperature 97.9 F (36.6 C), height 5' 4" (1.626 m), weight 186 lb (84.4 kg), SpO2 100 %. Body mass index is 31.93 kg/m.  General: Cooperative, alert, well developed, in no acute distress. HEENT: Conjunctivae and lids unremarkable. Cardiovascular: Regular rhythm.  Lungs: Normal work of breathing. Neurologic: No focal deficits.   Lab Results  Component Value Date   CREATININE 0.67 11/03/2022   BUN 20 11/03/2022   NA 137 11/03/2022   K 4.5 11/03/2022   CL 95 (L) 11/03/2022   CO2 24 11/03/2022   Lab Results  Component Value Date   ALT 17 11/03/2022   AST 15 11/03/2022   ALKPHOS 93 11/03/2022   BILITOT 0.5 11/03/2022   Lab Results  Component Value Date   HGBA1C 6.0 (H) 11/02/2022   HGBA1C 6.0 (H) 07/01/2022   HGBA1C 6.0 (H) 03/01/2022   HGBA1C 6.0 (  H) 10/28/2021   HGBA1C 5.9 (H) 07/02/2021   Lab Results  Component Value Date   INSULIN 9.7 11/03/2022   INSULIN 31.4 (H) 01/27/2021   Lab Results  Component Value Date   TSH 0.575 11/02/2022   Lab Results  Component Value Date   CHOL 214 (H) 11/02/2022   HDL 60 11/02/2022   LDLCALC 139 (H) 11/02/2022   TRIG 84 11/02/2022   CHOLHDL 3.6 11/02/2022   Lab Results  Component Value Date   VD25OH 52.2 11/03/2022   VD25OH 52.0 02/04/2022   VD25OH 61.9 07/02/2021   Lab Results  Component Value Date    WBC 4.8 11/02/2022   HGB 11.7 11/02/2022   HCT 34.2 11/02/2022   MCV 85 11/02/2022   PLT 446 11/02/2022   No results found for: "IRON", "TIBC", "FERRITIN"  Attestation Statements:   Reviewed by clinician on day of visit: allergies, medications, problem list, medical history, surgical history, family history, social history, and previous encounter notes.   I, Trixie Dredge, am acting as transcriptionist for Dennard Nip, MD.  I have reviewed the above documentation for accuracy and completeness, and I agree with the above. -  Dennard Nip, MD

## 2022-11-19 ENCOUNTER — Encounter: Payer: Self-pay | Admitting: Internal Medicine

## 2022-11-19 ENCOUNTER — Ambulatory Visit: Payer: Medicare Other | Admitting: Internal Medicine

## 2022-11-19 VITALS — BP 140/78 | HR 78 | Temp 97.9°F | Ht 65.0 in | Wt 190.2 lb

## 2022-11-19 DIAGNOSIS — R058 Other specified cough: Secondary | ICD-10-CM | POA: Diagnosis not present

## 2022-11-19 MED ORDER — RABEPRAZOLE SODIUM 20 MG PO TBEC: 20.0000 mg | DELAYED_RELEASE_TABLET | Freq: Every day | ORAL | 11 refills | Status: DC

## 2022-11-19 NOTE — Progress Notes (Unsigned)
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 > Shots x one year no better        History of Present Illness  11/19/2022  Pulmonary/ 1st office eval/Deborah [name]  Chief Complaint  Patient presents with   Consult    Persistent dry cough  Dyspnea:  some power walking more limited by knees than breathing  Cough: dry, worse at hs - coughs for an hour and then no more cough / causes urination and vomiting last episode 2 d prior to OV   Sleep: flat pillow  SABA use: none - they  don't  work Laughing brings it on  / perfumes also  Prednisone seems to help Prilosec made her sick   No obvious day to day or daytime pattern/variability or assoc excess/ purulent sputum or mucus plugs or hemoptysis or cp or chest tightness, subjective wheeze or overt sinus or hb symptoms.     Also denies any obvious fluctuation of symptoms with weather or environmental changes or other aggravating or alleviating factors except as outlined above   No unusual exposure hx or h/o childhood pna/ asthma or knowledge of premature birth.  Current Allergies, Complete Past Medical History, Past Surgical History, Family History, and Social History were reviewed in [employer] record.  ROS  The following are not active complaints unless bolded Hoarseness, sore throat, dysphagia, dental problems, itching, sneezing,  nasal congestion or discharge of excess mucus or purulent secretions, ear ache,   fever, chills, sweats, unintended wt loss or wt gain, classically pleuritic or  exertional cp,  orthopnea pnd or arm/hand swelling  or leg swelling, presyncope, palpitations, abdominal pain, anorexia, nausea, vomiting, diarrhea  or change in bowel habits or change in bladder habits, change in stools or change in urine, dysuria, hematuria,  rash, arthralgias, visual complaints, headache, numbness, weakness or ataxia or problems with walking or coordination,  change in mood or  memory.             Past Medical History:  Diagnosis Date   Back pain    Death of child 36   Due to cord strangulation.   Diabetes mellitus without complication (HCC)    Hyperlipidemia    Hypertension    Hypothyroidism    Joint pain    Osteoarthritis    Other fatigue    Shortness of breath on exertion    Thyroid condition     Outpatient Medications Prior to Visit  Medication Sig Dispense Refill   acetaminophen (TYLENOL) 500 MG tablet Take 1,000 mg by mouth at bedtime.     Blood Glucose Monitoring Suppl (ACURA BLOOD GLUCOSE METER) w/Device KIT Check blood sugars twice daily. Please provide patient with meter and supplies that is covered by pt ins. 1 kit 0   Blood Glucose Monitoring Suppl (ONE TOUCH ULTRA MINI) w/Device KIT Use to check blood sugars 3 times a day. Dx code:e11.65 1 kit 0   brimonidine (ALPHAGAN) 0.2 % ophthalmic solution 1 drop 2 (two) times daily.  cholecalciferol (VITAMIN D) 1000 units tablet Take 2,000 Units by mouth daily.     CO ENZYME Q-10 PO Take by mouth.     cyclobenzaprine (FLEXERIL) 10 MG tablet Take 1 tablet (10 mg total) by mouth 2 (two) times daily as needed for muscle spasms. 20 tablet 0   diclofenac Sodium (VOLTAREN) 1 % GEL Apply 4 g topically 4 (four) times daily. 100 g 0   docusate sodium (COLACE) 100 MG capsule Take 100 mg by mouth daily.     hydrALAZINE (APRESOLINE) 100 MG tablet TAKE 1 TABLET BY MOUTH  TWICE DAILY 180 tablet 3   hydrochlorothiazide (HYDRODIURIL) 25 MG tablet Take 1 tablet (25 mg total) by mouth daily. NEED APPOINTMENT 30 tablet 0    HYDROcodone bit-homatropine (HYDROMET) 5-1.5 MG/5ML syrup Take 5 mLs by mouth every 6 (six) hours as needed for cough. 120 mL 0   latanoprost (XALATAN) 0.005 % ophthalmic solution Place 1 drop into both eyes at bedtime.     levothyroxine (SYNTHROID) 88 MCG tablet Take 1 tablet (88 mcg total) by mouth daily before breakfast. 100 tablet 1   MAGNESIUM PO Take 375 mg by mouth daily.     melatonin 5 MG TABS Take 5 mg by mouth at bedtime.     metFORMIN (GLUCOPHAGE-XR) 500 MG 24 hr tablet Take 1 tablet (500 mg total) by mouth 2 (two) times daily. 180 tablet 0   ONETOUCH ULTRA test strip USE WITH METER TO CHECK  BLOOD SUGAR TWICE DAILY  BEFORE BREAKFAST AND BEFORE DINNER 200 strip 2   pravastatin (PRAVACHOL) 20 MG tablet Take 1 tablet (20 mg total) by mouth every evening. 90 tablet 1   spironolactone (ALDACTONE) 25 MG tablet Take 1 tablet (25 mg total) by mouth daily. NEED APPOINTMENT 30 tablet 0   timolol (BETIMOL) 0.25 % ophthalmic solution Place 1 drop into both eyes daily.     Turmeric 500 MG CAPS Take 1 tablet by mouth as needed (Takes occassionally).     No facility-administered medications prior to visit.     Objective:     BP (!) 140/78 (BP Location: Left Arm, Patient Position: Sitting, Cuff Size: Large)   Pulse 78   Temp 97.9 F (36.6 C) (Oral)   Ht _0  (1.651 m)   Wt 190 lb 3.2 oz (86.3 kg)   SpO2 99%   BMI 31.65 kg/m   SpO2: 99 %  Amb bf nad    HEENT : Oropharynx  clear      Nasal turbinates nl    NECK :  without  apparent JVD/ palpable Nodes/TM    LUNGS: no acc muscle use,  Nl contour chest which is clear to A and P bilaterally without cough on insp or exp maneuvers   CV:  RRR  no s3 or murmur or increase in P2, and no edema   ABD:  soft and nontender with nl inspiratory excursion in the supine position. No bruits or organomegaly appreciated   MS:  Nl gait/ ext warm without deformities Or obvious joint restrictions  calf tenderness, cyanosis or clubbing    SKIN:  warm and dry without lesions    NEURO:  alert, approp, nl sensorium with  no motor or cerebellar deficits apparent.       Assessment   No problem-specific Assessment & Plan notes found for this encounter.     Christinia Gully, MD 11/19/2022

## 2022-11-19 NOTE — Patient Instructions (Signed)
If possible have your eye doctor change you from timolol to betaoptic   Aciphex 20 mg Take 30-60 min before first meal of the day and add pepcid 20 mg one after supper  For drainage / throat tickle try take CHLORPHENIRAMINE  4 mg  ("Allergy Relief" '4mg'$   at [facility] should be easiest to find in the blue (equate is green) box usually on bottom shelf)  take one every 4 hours as needed - extremely effective and inexpensive over the counter- may cause drowsiness so start with just a dose or two an hour before bedtime and see how you tolerate it before trying in daytime.   GERD (REFLUX)  is an extremely common cause of respiratory symptoms just like yours , many times with no obvious heartburn at all.    It can be treated with medication, but also with lifestyle changes including elevation of the head of your bed (ideally with 6 -8inch blocks under the headboard of your bed),  Smoking cessation, avoidance of late meals, excessive alcohol, and avoid fatty foods, chocolate, peppermint, colas, red wine, and acidic juices such as orange juice.  NO MINT OR MENTHOL PRODUCTS SO NO COUGH DROPS  USE SUGARLESS CANDY INSTEAD (Jolley ranchers or Stover's or Life Savers) or even ice chips will also do - the key is to swallow to prevent all throat clearing. NO OIL BASED VITAMINS - use powdered substitutes.  Avoid fish oil when coughing.   Please schedule a follow up office visit in 6 weeks, call sooner if needed

## 2022-11-20 ENCOUNTER — Encounter: Payer: Self-pay | Admitting: Internal Medicine

## 2022-11-20 NOTE — Assessment & Plan Note (Addendum)
Onset in her 20s/ no better with allergy shots, saba/ worse with laughter/perfume and severe to point of urinary incont and vomiting  - 11/19/2022 rx aciphex and 1st gen H1 blockers per guidelines    Of the three most common causes of  Sub-acute / recurrent or chronic cough, only one (GERD)  can actually contribute to/ trigger  the other two (asthma and post nasal drip syndrome)  and perpetuate the cylce of cough.  While not intuitively obvious, many patients with chronic low grade reflux do not cough until there is a primary insult that disturbs the protective epithelial barrier and exposes sensitive nerve endings.   This is typically viral but can due to PNDS and  either may apply here.    >>> The point is that once this occurs, it is difficult to eliminate the cycle  using anything but a maximally effective acid suppression regimen at least in the short run, accompanied by an appropriate diet to address non acid GERD and control / eliminate any pnds with 1st gen H1 blockers per guidelines.  >>> also rec betoptic instead of timolol eyedrops since the latter have been assoc with asthma though doubt this is the driving mechanism for the cough    Advised: The standardized cough guidelines published in Chest by Lissa [name] in 2006 are still the best available and consist of a multiple step process (up to 12!) , not a single office visit,  and are intended  to address this problem logically,  with an alogrithm dependent on response to empiric treatment at  each progressive step  to determine a specific diagnosis with  minimal addtional testing needed. Therefore if adherence is an issue or can't be accurately verified,  it's very unlikely the standard evaluation and treatment will be successful here.    Furthermore, response to therapy (other than acute cough suppression, which should only be used short term with avoidance of narcotic containing cough syrups if possible), can be a gradual process for  which the patient is not likely to  perceive immediate benefit.  Unlike going to an eye doctor where the best perscription is almost always the first one and is immediately effective, this is almost never the case in the management of chronic cough syndromes. Therefore the patient needs to commit up front to consistently adhere to recommendations  for up to 6 weeks of therapy directed at the likely underlying problem(s) before the response can be reasonably evaluated.      Each maintenance medication was reviewed in detail including emphasizing most importantly the difference between maintenance and prns and under what circumstances the prns are to be triggered using an action plan format where appropriate.  Total time for H and P, chart review, counseling,  and generating customized AVS unique to this office visit / same day charting > 45 min with pt new to me

## 2022-11-26 ENCOUNTER — Ambulatory Visit: Payer: Medicare Other | Admitting: Podiatry

## 2022-11-26 VITALS — BP 136/77 | HR 86

## 2022-11-26 DIAGNOSIS — E119 Type 2 diabetes mellitus without complications: Secondary | ICD-10-CM

## 2022-11-26 DIAGNOSIS — M19071 Primary osteoarthritis, right ankle and foot: Secondary | ICD-10-CM | POA: Diagnosis not present

## 2022-11-26 DIAGNOSIS — M21619 Bunion of unspecified foot: Secondary | ICD-10-CM

## 2022-11-26 NOTE — Progress Notes (Unsigned)
Subjective:   Patient ID: Deborah [name], female   DOB: 71 y.o.   MRN: [id number]   HPI Chief Complaint  Patient presents with   Bunions    Diabetic foot care, Right foot bunion pain, started over 5 years ago, Rate of pain 5 out of 10, Diabetic A1c- 6.0 BG- 107, X-Rays done today     No history of ulceration to feet  No numbness or tingling  She gets cramps in the legs but she thought it was coming from her medication, Crestor and they just changed to another medication. She still gets spasms but depends on how much she works.   ROS      Objective:  Physical Exam  ***     Assessment:  ***     Plan:  ***

## 2022-12-01 ENCOUNTER — Ambulatory Visit (INDEPENDENT_AMBULATORY_CARE_PROVIDER_SITE_OTHER): Payer: Medicare Other | Admitting: Family Medicine

## 2022-12-01 ENCOUNTER — Encounter (INDEPENDENT_AMBULATORY_CARE_PROVIDER_SITE_OTHER): Payer: Self-pay | Admitting: Family Medicine

## 2022-12-01 VITALS — BP 151/77 | HR 75 | Temp 97.5°F | Ht 65.0 in | Wt 184.0 lb

## 2022-12-01 DIAGNOSIS — E119 Type 2 diabetes mellitus without complications: Secondary | ICD-10-CM | POA: Diagnosis not present

## 2022-12-01 DIAGNOSIS — Z6831 Body mass index (BMI) 31.0-31.9, adult: Secondary | ICD-10-CM | POA: Diagnosis not present

## 2022-12-01 DIAGNOSIS — Z7984 Long term (current) use of oral hypoglycemic drugs: Secondary | ICD-10-CM | POA: Diagnosis not present

## 2022-12-01 DIAGNOSIS — E669 Obesity, unspecified: Secondary | ICD-10-CM

## 2022-12-01 DIAGNOSIS — I10 Essential (primary) hypertension: Secondary | ICD-10-CM | POA: Diagnosis not present

## 2022-12-01 MED ORDER — METFORMIN HCL ER 500 MG PO TB24: 500.0000 mg | ORAL_TABLET | Freq: Two times a day (BID) | ORAL | 0 refills | Status: DC

## 2022-12-07 ENCOUNTER — Encounter: Payer: Self-pay | Admitting: Nurse Practitioner

## 2022-12-08 ENCOUNTER — Other Ambulatory Visit: Payer: Self-pay

## 2022-12-08 DIAGNOSIS — I10 Essential (primary) hypertension: Secondary | ICD-10-CM

## 2022-12-09 ENCOUNTER — Encounter: Payer: Self-pay | Admitting: Nurse Practitioner

## 2022-12-09 ENCOUNTER — Telehealth: Payer: Medicare Other | Admitting: Physician Assistant

## 2022-12-09 DIAGNOSIS — R6889 Other general symptoms and signs: Secondary | ICD-10-CM

## 2022-12-09 MED ORDER — OSELTAMIVIR PHOSPHATE 75 MG PO CAPS: 75.0000 mg | ORAL_CAPSULE | Freq: Two times a day (BID) | ORAL | 0 refills | Status: DC

## 2022-12-09 NOTE — Progress Notes (Signed)
E visit for Flu like symptoms   We are sorry that you are not feeling well.  Here is how we plan to help! Based on what you have shared with me it looks like you may have a respiratory virus that may be influenza.  Influenza or "the flu" is   an infection caused by a respiratory virus. The flu virus is highly contagious and persons who did not receive their yearly flu vaccination may "catch" the flu from close contact.  We have anti-viral medications to treat the viruses that cause this infection. They are not a "cure" and only shorten the course of the infection. These prescriptions are most effective when they are given within the first 2 days of "flu" symptoms. Antiviral medication are indicated if you have a high risk of complications from the flu. You should  also consider an antiviral medication if you are in close contact with someone who is at risk. These medications can help patients avoid complications from the flu  but have side effects that you should know. Possible side effects from Tamiflu or oseltamivir include nausea, vomiting, diarrhea, dizziness, headaches, eye redness, sleep problems or other respiratory symptoms. You should not take Tamiflu if you have an allergy to oseltamivir or any to the ingredients in Tamiflu.  Based upon your symptoms and potential risk factors I have prescribed Oseltamivir (Tamiflu).  It has been sent to your designated pharmacy.  You will take one 75 mg capsule orally twice a day for the next 5 days. and I recommend that you follow the flu symptoms recommendation that I have listed below.  Please keep well-hydrated and try to get plenty of rest. If you have a humidifier, place it in the bedroom and run it at night. Start a saline nasal rinse for nasal congestion. You can consider use of a nasal steroid spray like Flonase or Nasacort OTC. Salt water-gargles and chloraseptic spray can be very beneficial for sore throat. Mucinex-DM for congestion or  cough. Please take all prescribed medications as directed.  Remain out of work until [employer] for 24 hours without a fever-reducing medication, and you are feeling better.  You should mask until symptoms are resolved.  If anything worsens despite treatment, you need to be evaluated in-person. Please do not delay care.   ANYONE WHO HAS FLU SYMPTOMS SHOULD: Stay home. The flu is highly contagious and going out or to work exposes others! Be sure to drink plenty of fluids. Water is fine as well as fruit juices, sodas and electrolyte beverages. You may want to stay away from caffeine or alcohol. If you are nauseated, try taking small sips of liquids. How do you know if you are getting enough fluid? Your urine should be a pale yellow or almost colorless. Get rest. Taking a steamy shower or using a humidifier may help nasal congestion and ease sore throat pain. Using a saline nasal spray works much the same way. Cough drops, hard candies and sore throat lozenges may ease your cough. Line up a caregiver. Have someone check on you regularly.   GET HELP RIGHT AWAY IF: You cannot keep down liquids or your medications. You become short of breath Your fell like you are going to pass out or loose consciousness. Your symptoms persist after you have completed your treatment plan MAKE SURE YOU  Understand these instructions. Will watch your condition. Will get help right away if you are not doing well or get worse.  Your e-visit answers were reviewed by  a board certified advanced clinical practitioner to complete your personal care plan.  Depending on the condition, your plan could have included both over the counter or prescription medications.  If there is a problem please reply  once you have received a response from your provider.  Your safety is important to [place].  If you have drug allergies check your prescription carefully.    You can use MyChart to ask questions about today's visit, request a  non-urgent call back, or ask for a work or school excuse for 24 hours related to this e-Visit. If it has been greater than 24 hours you will need to follow up with your provider, or enter a new e-Visit to address those concerns.  You will get an e-mail in the next two days asking about your experience.  I hope that your e-visit has been valuable and will speed your recovery. Thank you for using e-visits.

## 2022-12-09 NOTE — Progress Notes (Signed)
I have spent 5 minutes in review of e-visit questionnaire, review and updating patient chart, medical decision making and response to patient.   Vitaliy Eisenhour Cody Krithika Tome, PA-C    

## 2022-12-15 ENCOUNTER — Other Ambulatory Visit: Payer: Self-pay | Admitting: Nurse Practitioner

## 2022-12-15 DIAGNOSIS — R053 Chronic cough: Secondary | ICD-10-CM

## 2022-12-15 MED ORDER — HYDROCODONE BIT-HOMATROP MBR 5-1.5 MG/5ML PO SOLN: 5.0000 mL | Freq: Four times a day (QID) | ORAL | 0 refills | Status: DC | PRN

## 2022-12-15 NOTE — Progress Notes (Signed)
Chief Complaint:   OBESITY Deborah [name] is here to discuss her progress with her obesity treatment plan along with follow-up of her obesity related diagnoses. Deborah [name] is on the Category 2 Plan and states she is following her eating plan approximately 80% of the time. Deborah [name] states she is doing Chief of Staff for 45 minutes 2-3 times per week.  Today's visit was #: 26 Starting weight: 204 lbs Starting date: 02/06/2021 Today's weight: 184 lbs Today's date: 12/01/2022 Total lbs lost to date: 20 Total lbs lost since last in-office visit: 2  Interim History: Deborah [name] has done well with her weight loss even over Thanksgiving. She is trying to decrease simple carbohydrates.   Subjective:   1. Type 2 diabetes mellitus without complication, without long-term current use of insulin (HCC) Deborah [name] states her fasting blood sugars ranges between 99-115, and she is doing very well with decreasing simple carbohydrates and portion control.   2. Essential hypertension Deborah [name]'s blood pressure is elevated today. She was normal at her PCP's office earlier, but  always seems to be elevated here in our office.   Assessment/Plan:   1. Type 2 diabetes mellitus without complication, without long-term current use of insulin (HCC) Deborah [name] will continue metformin 500 mg BID, and we will refill for 90 days.   - metFORMIN (GLUCOPHAGE-XR) 500 MG 24 hr tablet; Take 1 tablet (500 mg total) by mouth 2 (two) times daily.  Dispense: 180 tablet; Refill: 0  2. Essential hypertension Deborah [name] will continue her diet, exercise, and weight loss. She is to check her blood pressure at home 1-2 times per week to compare to ours at her next visit.   3. Obesity, Current BMI 31.7 Deborah [name] is currently in the action stage of change. As such, her goal is to continue with weight loss efforts. She has agreed to the Category 2 Plan.   Exercise goals: As is.  Behavioral modification strategies: decreasing simple carbohydrates,  increasing water intake, and holiday eating strategies .  Deborah [name] has agreed to follow-up with our clinic in 3 weeks. She was informed of the importance of frequent follow-up visits to maximize her success with intensive lifestyle modifications for her multiple health conditions.   Objective:   Blood pressure (!) 151/77, pulse 75, temperature (!) 97.5 F (36.4 C), height '5\' 5"'$  (1.651 m), weight 184 lb (83.5 kg), SpO2 98 %. Body mass index is 30.62 kg/m.  General: Cooperative, alert, well developed, in no acute distress. HEENT: Conjunctivae and lids unremarkable. Cardiovascular: Regular rhythm.  Lungs: Normal work of breathing. Neurologic: No focal deficits.   Lab Results  Component Value Date   CREATININE 0.67 11/03/2022   BUN 20 11/03/2022   NA 137 11/03/2022   K 4.5 11/03/2022   CL 95 (L) 11/03/2022   CO2 24 11/03/2022   Lab Results  Component Value Date   ALT 17 11/03/2022   AST 15 11/03/2022   ALKPHOS 93 11/03/2022   BILITOT 0.5 11/03/2022   Lab Results  Component Value Date   HGBA1C 6.0 (H) 11/02/2022   HGBA1C 6.0 (H) 07/01/2022   HGBA1C 6.0 (H) 03/01/2022   HGBA1C 6.0 (H) 10/28/2021   HGBA1C 5.9 (H) 07/02/2021   Lab Results  Component Value Date   INSULIN 9.7 11/03/2022   INSULIN 31.4 (H) 01/27/2021   Lab Results  Component Value Date   TSH 0.575 11/02/2022   Lab Results  Component Value Date   CHOL 214 (H) 11/02/2022   HDL 60 11/02/2022   LDLCALC 139 (H)  11/02/2022   TRIG 84 11/02/2022   CHOLHDL 3.6 11/02/2022   Lab Results  Component Value Date   VD25OH 52.2 11/03/2022   VD25OH 52.0 02/04/2022   VD25OH 61.9 07/02/2021   Lab Results  Component Value Date   WBC 4.8 11/02/2022   HGB 11.7 11/02/2022   HCT 34.2 11/02/2022   MCV 85 11/02/2022   PLT 446 11/02/2022   No results found for: "IRON", "TIBC", "FERRITIN"  Attestation Statements:   Reviewed by clinician on day of visit: allergies, medications, problem list, medical history,  surgical history, family history, social history, and previous encounter notes.   I, Trixie Dredge, am acting as transcriptionist for Dennard Nip, MD.  I have reviewed the above documentation for accuracy and completeness, and I agree with the above. -  Dennard Nip, MD

## 2022-12-16 ENCOUNTER — Encounter: Payer: Self-pay | Admitting: Nurse Practitioner

## 2022-12-23 ENCOUNTER — Telehealth: Payer: Self-pay | Admitting: Orthopedic Surgery

## 2022-12-23 NOTE — Telephone Encounter (Signed)
Pt called requesting to submit to her insurance company for Euflexxa bilateral knee gel injections. Please call pt when approved. Phone number is [phone].

## 2022-12-26 NOTE — Progress Notes (Signed)
Office Visit    Patient Name: Deborah [name] Date of Encounter: 12/27/2022  Primary Care Provider:  [name], FNP Primary Cardiologist:  None Primary Electrophysiologist: None  Chief Complaint    Deborah [name] is a 72 y.o. female with PMH of HTN, hypothyroidism, DM type II who presents today for follow-up of hypertension.  Past Medical History    Past Medical History:  Diagnosis Date   Back pain    Death of child 43   Due to cord strangulation.   Diabetes mellitus without complication (HCC)    Hyperlipidemia    Hypertension    Hypothyroidism    Joint pain    Osteoarthritis    Other fatigue    Shortness of breath on exertion    Thyroid condition    Past Surgical History:  Procedure Laterality Date   APPENDECTOMY  1974   CARDIAC CATHETERIZATION     two, dates not provided.   CHOLECYSTECTOMY     confirm date with patient, was it 35?   LAPAROSCOPIC TUBAL LIGATION  1987   THYROIDECTOMY      Allergies  Allergies  Allergen Reactions   Amlodipine     headache   Aspartame And Phenylalanine Other (See Comments)    Causes migraines   Bidil [Isosorb Dinitrate-Hydralazine]     headache   Erythromycin     Ask patient to specify type/severity/reaction. Not indicated on medical history form dated 11/04/10.   Fentanyl     Causes panic attacks   Ibuprofen     Ask patient to specify type/severity/reaction. Not noted on medical history form dated 11/04/10.   Lisinopril Cough   Metoprolol Other (See Comments)   Phenylalanine Other (See Comments)    Causes migraine   Prilosec [Omeprazole] Nausea And Vomiting   Rosuvastatin Other (See Comments)   Tramadol Nausea And Vomiting   Valsartan Cough   Verapamil Nausea And Vomiting   Zantac [Ranitidine Hcl]     History of Present Illness    Deborah [name]  is a 72year old female with the above mention past medical history who presents today for follow-up of hypertension.  Deborah [name]  was initially seen in 2021 by Dr. Duke Salvia for elevated blood pressure.  She reported history of intolerance to many blood pressure medications and blood pressures were in the 140s over 80s.  She was started on HCTZ 25 mg, hydralazine 100 mg twice daily and spironolactone 25 mg.  She was seen in follow-up 01/31/2020 and blood pressure was still poorly controlled and hydralazine was increased to 100 mg  times daily. She has been lost to follow-up since 2021.  Deborah [name] presents today for overdue follow-up alone for hypertension.  Since last being seen in the office patient reports she has been doing well with no cardiac complaints since her previous visit.  Her blood pressure today however is elevated initially at 144/78 and was 152/82 on recheck.  She reports at home that her blood pressures range anywhere from 110/72 to 159/88.  She reports that she has been off of her hydralazine and spironolactone for the past 3 weeks.  During our visit we discussed the pathophysiology of blood pressure medications including spironolactone and HCTZ.  We discussed a plan through shared decision to check her blood pressures over the next 2 weeks and report back with findings.  She is staying active with Silver sneakers 2-3 times per week and also reports dissipating at healthy weight and wellness.  She notes some  cramping with HCTZ as well and has increased her fluid intake..  Patient denies chest pain, palpitations, dyspnea, PND, orthopnea, nausea, vomiting, dizziness, syncope, edema, weight gain, or early satiety.  Home Medications    Current Outpatient Medications  Medication Sig Dispense Refill   acetaminophen (TYLENOL) 500 MG tablet Take 1,000 mg by mouth at bedtime.     Blood Glucose Monitoring Suppl (ACURA BLOOD GLUCOSE METER) w/Device KIT Check blood sugars twice daily. Please provide patient with meter and supplies that is covered by pt ins. 1 kit 0   Blood Glucose Monitoring Suppl (ONE TOUCH ULTRA MINI)  w/Device KIT Use to check blood sugars 3 times a day. Dx code:e11.65 1 kit 0   brimonidine (ALPHAGAN) 0.2 % ophthalmic solution 1 drop 2 (two) times daily.     cholecalciferol (VITAMIN D) 1000 units tablet Take 2,000 Units by mouth daily.     CO ENZYME Q-10 PO Take by mouth.     cyclobenzaprine (FLEXERIL) 10 MG tablet Take 1 tablet (10 mg total) by mouth 2 (two) times daily as needed for muscle spasms. 20 tablet 0   diclofenac Sodium (VOLTAREN) 1 % GEL Apply 4 g topically 4 (four) times daily. 100 g 0   docusate sodium (COLACE) 100 MG capsule Take 100 mg by mouth daily.     hydrALAZINE (APRESOLINE) 100 MG tablet TAKE 1 TABLET BY MOUTH  TWICE DAILY 180 tablet 3   HYDROcodone bit-homatropine (HYDROMET) 5-1.5 MG/5ML syrup Take 5 mLs by mouth every 6 (six) hours as needed for cough. 120 mL 0   latanoprost (XALATAN) 0.005 % ophthalmic solution Place 1 drop into both eyes at bedtime.     levothyroxine (SYNTHROID) 88 MCG tablet Take 1 tablet (88 mcg total) by mouth daily before breakfast. 100 tablet 1   MAGNESIUM PO Take 375 mg by mouth daily.     Melatonin 10 MG CAPS Take 10 mg by mouth at bedtime.     metFORMIN (GLUCOPHAGE-XR) 500 MG 24 hr tablet Take 1 tablet (500 mg total) by mouth 2 (two) times daily. 180 tablet 0   ONETOUCH ULTRA test strip USE WITH METER TO CHECK  BLOOD SUGAR TWICE DAILY  BEFORE BREAKFAST AND BEFORE DINNER 200 strip 2   oseltamivir (TAMIFLU) 75 MG capsule Take 1 capsule (75 mg total) by mouth 2 (two) times daily. 10 capsule 0   pravastatin (PRAVACHOL) 20 MG tablet Take 1 tablet (20 mg total) by mouth every evening. 90 tablet 1   RABEprazole (ACIPHEX) 20 MG tablet Take 1 tablet (20 mg total) by mouth daily. 30 tablet 11   timolol (BETIMOL) 0.25 % ophthalmic solution Place 1 drop into both eyes daily.     Turmeric 500 MG CAPS Take 1 tablet by mouth as needed (Takes occassionally).     hydrochlorothiazide (HYDRODIURIL) 25 MG tablet Take 1 tablet (25 mg total) by mouth daily. 90  tablet 1   No current facility-administered medications for this visit.     Review of Systems  Please see the history of present illness.    (+) Lower extremity cramping  All other systems reviewed and are otherwise negative except as noted above.  Physical Exam    Wt Readings from Last 3 Encounters:  12/27/22 185 lb 6.4 oz (84.1 kg)  12/01/22 184 lb (83.5 kg)  11/19/22 190 lb 3.2 oz (86.3 kg)   VS: Vitals:   12/27/22 0855 12/27/22 0941  BP: (!) 144/78 (!) 152/82  Pulse: 86   SpO2: 98%   ,  Body mass index is 30.85 kg/m.  Constitutional:      Appearance: Healthy appearance. Not in distress.  Neck:     Vascular: JVD normal.  Pulmonary:     Effort: Pulmonary effort is normal.     Breath sounds: No wheezing. No rales. Diminished in the bases Cardiovascular:     Normal rate. Regular rhythm. Normal S1. Normal S2.      Murmurs: There is no murmur.  Edema:    Peripheral edema absent.  Abdominal:     Palpations: Abdomen is soft non tender. There is no hepatomegaly.  Skin:    General: Skin is warm and dry.  Neurological:     General: No focal deficit present.     Mental Status: Alert and oriented to person, place and time.     Cranial Nerves: Cranial nerves are intact.  EKG/LABS/Other Studies Reviewed    ECG personally reviewed by me today -none completed today    Lab Results  Component Value Date   WBC 4.8 11/02/2022   HGB 11.7 11/02/2022   HCT 34.2 11/02/2022   MCV 85 11/02/2022   PLT 446 11/02/2022   Lab Results  Component Value Date   CREATININE 0.67 11/03/2022   BUN 20 11/03/2022   NA 137 11/03/2022   K 4.5 11/03/2022   CL 95 (L) 11/03/2022   CO2 24 11/03/2022   Lab Results  Component Value Date   ALT 17 11/03/2022   AST 15 11/03/2022   ALKPHOS 93 11/03/2022   BILITOT 0.5 11/03/2022   Lab Results  Component Value Date   CHOL 214 (H) 11/02/2022   HDL 60 11/02/2022   LDLCALC 139 (H) 11/02/2022   TRIG 84 11/02/2022   CHOLHDL 3.6 11/02/2022     Lab Results  Component Value Date   HGBA1C 6.0 (H) 11/02/2022    Assessment & Plan    1.  Essential hypertension: HYPERTENSION CONTROL Vitals:   12/27/22 0855 12/27/22 0941  BP: (!) 144/78 (!) 152/82    The patient's blood pressure is elevated above target today.  In order to address the patient's elevated BP: Blood pressure will be monitored at home to determine if medication changes need to be made.; A current anti-hypertensive medication was adjusted today.     -We will discontinue spironolactone 25 mg continue HCTZ 25 mg. -Patient will check blood pressures over the next 2 weeks and if remaining elevated we will plan to increase hydralazine twice daily dose or add back spironolactone.   2.  DM type II: -Patient's last hemoglobin A1c was 6.0 -Continue treatment plan per PCP  3.  Hyperlipidemia: -Patient's last LDL was 139 and total cholesterol 214 above goal of less than 100 -She is currently on pravastatin 20 mg due to cramps from Crestor  4.  Shortness of breath: -Patient denies any shortness of breath with activity  Disposition: Follow-up with None or APP in 12 months    Medication Adjustments/Labs and Tests Ordered: Current medicines are reviewed at length with the patient today.  Concerns regarding medicines are outlined above.   Signed, Napoleon Form, Leodis Rains, NP 12/27/2022, 10:01 AM [facility]  Note:  This document was prepared using Dragon voice recognition software and may include unintentional dictation errors.

## 2022-12-27 ENCOUNTER — Encounter (INDEPENDENT_AMBULATORY_CARE_PROVIDER_SITE_OTHER): Payer: Self-pay | Admitting: Physician Assistant

## 2022-12-27 ENCOUNTER — Telehealth: Payer: Self-pay | Admitting: Orthopedic Surgery

## 2022-12-27 ENCOUNTER — Encounter: Payer: Self-pay | Admitting: Nurse Practitioner

## 2022-12-27 ENCOUNTER — Ambulatory Visit (INDEPENDENT_AMBULATORY_CARE_PROVIDER_SITE_OTHER): Payer: Medicare Other | Admitting: Physician Assistant

## 2022-12-27 ENCOUNTER — Ambulatory Visit: Payer: Medicare Other | Attending: Nurse Practitioner | Admitting: Nurse Practitioner

## 2022-12-27 VITALS — BP 152/82 | HR 86 | Ht 65.0 in | Wt 185.4 lb

## 2022-12-27 VITALS — BP 155/72 | HR 81 | Temp 98.0°F | Ht 65.0 in | Wt 182.0 lb

## 2022-12-27 DIAGNOSIS — E1169 Type 2 diabetes mellitus with other specified complication: Secondary | ICD-10-CM

## 2022-12-27 DIAGNOSIS — R0602 Shortness of breath: Secondary | ICD-10-CM | POA: Diagnosis not present

## 2022-12-27 DIAGNOSIS — E119 Type 2 diabetes mellitus without complications: Secondary | ICD-10-CM | POA: Diagnosis not present

## 2022-12-27 DIAGNOSIS — Z6830 Body mass index (BMI) 30.0-30.9, adult: Secondary | ICD-10-CM

## 2022-12-27 DIAGNOSIS — E785 Hyperlipidemia, unspecified: Secondary | ICD-10-CM

## 2022-12-27 DIAGNOSIS — E669 Obesity, unspecified: Secondary | ICD-10-CM

## 2022-12-27 DIAGNOSIS — Z7984 Long term (current) use of oral hypoglycemic drugs: Secondary | ICD-10-CM | POA: Diagnosis not present

## 2022-12-27 DIAGNOSIS — I10 Essential (primary) hypertension: Secondary | ICD-10-CM

## 2022-12-27 MED ORDER — HYDROCHLOROTHIAZIDE 25 MG PO TABS: 25.0000 mg | ORAL_TABLET | Freq: Every day | ORAL | 1 refills | Status: DC

## 2022-12-27 MED ORDER — METFORMIN HCL ER 500 MG PO TB24: 500.0000 mg | ORAL_TABLET | Freq: Two times a day (BID) | ORAL | 0 refills | Status: DC

## 2022-12-27 NOTE — Telephone Encounter (Signed)
VOB submitted for Euflexxa, bilateral knee.  

## 2022-12-27 NOTE — Patient Instructions (Addendum)
Medication Instructions:  DISCONTINUE Spironolactone  *If you need a refill on your cardiac medications before your next appointment, please call your pharmacy*  Lab Work: None Ordered   Testing/Procedures: None Ordered   Follow-Up: At [facility], you and your health needs are our priority.  As part of our continuing mission to provide you with exceptional heart care, we have created designated Provider Care Teams.  These Care Teams include your primary Cardiologist (physician) and Advanced Practice Providers (APPs -  Physician Assistants and Nurse Practitioners) who all work together to provide you with the care you need, when you need it.  We recommend signing up for the patient portal called "MyChart".  Sign up information is provided on this After Visit Summary.  MyChart is used to connect with patients for Virtual Visits (Telemedicine).  Patients are able to view lab/test results, encounter notes, upcoming appointments, etc.  Non-urgent messages can be sent to your provider as well.   To learn more about what you can do with MyChart, go to [email or web address].    Your next appointment:   12 month(s)  The format for your next appointment:   In Person  Provider:   Skeet Latch, MD    Other Instructions CHECK YOUR BLOOD PRESSURE DAILY FOR 1-2 WEEKS AND CONTACT THE OFFICE WITH YOUR READINGS   Important Information About Sugar

## 2022-12-27 NOTE — Telephone Encounter (Signed)
Patient called needing a call back concerning the injections she is suppose to be scheduled for. The number to contact patient is [phone]

## 2022-12-27 NOTE — Telephone Encounter (Signed)
Talked with patient concerning gel injection and this has been submitted for Euflexxa.

## 2022-12-28 ENCOUNTER — Telehealth: Payer: Self-pay

## 2022-12-28 NOTE — Telephone Encounter (Signed)
PA required for Euflexxa, bilateral knee. PA submitted through Euflexxa Portal.  PA may not be approved due to Euflexxa not being a preferred product. PA pending

## 2022-12-28 NOTE — Telephone Encounter (Addendum)
Called and left a VM for patient to return my call concerning gel injection.  Talked with patient and advised her that Euflexxa is not a preferred product and that the preferred products are Gelsyn-3, Durolane, Synvisc, & SynviscOne.  Patient decided to go with Durolane.  VOB submitted for Durolane, bilateral knee

## 2022-12-31 ENCOUNTER — Telehealth: Payer: Self-pay

## 2022-12-31 ENCOUNTER — Telehealth: Payer: Self-pay | Admitting: Orthopedic Surgery

## 2022-12-31 DIAGNOSIS — M1711 Unilateral primary osteoarthritis, right knee: Secondary | ICD-10-CM

## 2022-12-31 DIAGNOSIS — M1712 Unilateral primary osteoarthritis, left knee: Secondary | ICD-10-CM

## 2022-12-31 NOTE — Telephone Encounter (Signed)
Called and left a VM for patient to CB to schedule for gel injection with Dr. Dean or Luke.  Check referral tab  

## 2023-01-03 NOTE — Progress Notes (Unsigned)
Chief Complaint:   OBESITY Deborah [name] is here to discuss her progress with her obesity treatment plan along with follow-up of her obesity related diagnoses. Deborah [name] is on the Category 2 Plan and states she is following her eating plan approximately 85% of the time. Deborah [name] states she is doing Retail banker Class 45 minutes 2-3 times per week.  Today's visit was #: 24 Starting weight: 204 lbs Starting date: 02/06/2021 Today's weight: 182 lbs Today's date: 12/27/2022 Total lbs lost to date: 22 Total lbs lost since last in-office visit: 2  Interim History: Deborah [name] has done well with weight loss overall. She had the flu recently and is still recovering. Breakfast- eggs, apple, string cheese Lunch- sandwich/[employer], rotisserie chicken sandwich Dinner- eating out a lot  Subjective:   1. Type 2 diabetes mellitus without complication, without long-term current use of insulin (HCC) A1c 6.0 on 11/02/2022. Deborah [name] is on Metformin 500 mg BID and working on decreasing simple carbohydrates and increasing lean proteins.  Assessment/Plan:   1. Type 2 diabetes mellitus without complication, without long-term current use of insulin (HCC) Continue Metformin, prudent nutritional plan, and exercise.  Refill- metFORMIN (GLUCOPHAGE-XR) 500 MG 24 hr tablet; Take 1 tablet (500 mg total) by mouth 2 (two) times daily.  Dispense: 180 tablet; Refill: 0  2. Obesity, Current BMI 30.3 Deborah [name] is currently in the action stage of change. As such, her goal is to continue with weight loss efforts. She has agreed to the Category 2 Plan and keeping a food journal and adhering to recommended goals of 400-500 calories and 35+ grams protein with supper.   Handout: Eating Out Guide  Exercise goals:  As is  Behavioral modification strategies: increasing lean protein intake, decreasing simple carbohydrates, decreasing eating out, and planning for success.  Sabel has agreed to follow-up with our clinic in 4 weeks. She  was informed of the importance of frequent follow-up visits to maximize her success with intensive lifestyle modifications for her multiple health conditions.   Objective:   Blood pressure (!) 155/72, pulse 81, temperature 98 F (36.7 C), height '5\' 5"'$  (1.651 m), weight 182 lb (82.6 kg), SpO2 95 %. Body mass index is 30.29 kg/m.  General: Cooperative, alert, well developed, in no acute distress. HEENT: Conjunctivae and lids unremarkable. Cardiovascular: Regular rhythm.  Lungs: Normal work of breathing. Neurologic: No focal deficits.   Lab Results  Component Value Date   CREATININE 0.67 11/03/2022   BUN 20 11/03/2022   NA 137 11/03/2022   K 4.5 11/03/2022   CL 95 (L) 11/03/2022   CO2 24 11/03/2022   Lab Results  Component Value Date   ALT 17 11/03/2022   AST 15 11/03/2022   ALKPHOS 93 11/03/2022   BILITOT 0.5 11/03/2022   Lab Results  Component Value Date   HGBA1C 6.0 (H) 11/02/2022   HGBA1C 6.0 (H) 07/01/2022   HGBA1C 6.0 (H) 03/01/2022   HGBA1C 6.0 (H) 10/28/2021   HGBA1C 5.9 (H) 07/02/2021   Lab Results  Component Value Date   INSULIN 9.7 11/03/2022   INSULIN 31.4 (H) 01/27/2021   Lab Results  Component Value Date   TSH 0.575 11/02/2022   Lab Results  Component Value Date   CHOL 214 (H) 11/02/2022   HDL 60 11/02/2022   LDLCALC 139 (H) 11/02/2022   TRIG 84 11/02/2022   CHOLHDL 3.6 11/02/2022   Lab Results  Component Value Date   VD25OH 52.2 11/03/2022   VD25OH 52.0 02/04/2022   VD25OH 61.9 07/02/2021  Lab Results  Component Value Date   WBC 4.8 11/02/2022   HGB 11.7 11/02/2022   HCT 34.2 11/02/2022   MCV 85 11/02/2022   PLT 446 11/02/2022   Attestation Statements:   Reviewed by clinician on day of visit: allergies, medications, problem list, medical history, surgical history, family history, social history, and previous encounter notes.  I, Kathlene November, BS, CMA, am acting as transcriptionist for [employer] [place], PA-C.  I have  reviewed the above documentation for accuracy and completeness, and I agree with the above. -  ***

## 2023-01-10 ENCOUNTER — Telehealth: Payer: Self-pay | Admitting: Nurse Practitioner

## 2023-01-10 NOTE — Telephone Encounter (Signed)
Please let Deborah [name] know that her B/P remains above her goal of 130/80 and she will need to increase her hydralazine to 100 mg BID. Please direct her to continue to check her BP and submit results after two weeks

## 2023-01-10 NOTE — Progress Notes (Signed)
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 > Shots x one year no better        History of Present Illness  11/19/2022  Pulmonary/ 1st office eval/Ramello Cordial for cough x decades / worse in winter  Chief Complaint  Patient presents with   Consult    Persistent dry cough  Dyspnea:  some power walking more limited by knees than breathing  Cough: dry, worse at hs - coughs for an hour and then no more cough / causes urination and vomiting last episode 2 d prior to OV   Sleep: flat pillow  SABA use: none - they  don't  work Laughing brings it on  / perfumes also  Prednisone seems to help Prilosec made her sick  Rec If possible have your eye doctor change you from timolol to betaoptic  Aciphex 20 mg Take 30-60 min before first meal of the day and add pepcid 20 mg one after supper For drainage / throat tickle try take CHLORPHENIRAMINE  4 mg  GERD diet       01/11/2023  f/u ov/Hong Moring re: recurrent cough s/p Flu A> tamaflu  maint on aciphex/pepcid /h1 x 2 hs the cough was not improved prior to the flu and then much worse   Chief Complaint  Patient presents with   Follow-up    Dx flu 12/09/22.  Took Tamiflu.  Still coughing with clear mucus.  Chest feels sore from coughing continuously.  Would like prednisone and more cough syrup with codeine  Dyspnea:  staying inside  Cough: minimal clear mucus  Sleeping: cough worse for naps and hs (anytime lies down)  SABA use: none /"they never work"  (on timolol x decades though and still is) 02: none    No obvious  patterns in day to day or daytime variability or assoc excess/ purulent sputum or mucus plugs or hemoptysis or cp or chest tightness, subjective wheeze or overt sinus or hb symptoms.    . Also denies any obvious fluctuation of symptoms with weather or environmental changes or other aggravating or alleviating factors except as outlined above   No unusual exposure hx or h/o childhood pna/ asthma or knowledge of premature birth.  Current Allergies, Complete Past Medical History, Past Surgical History, Family History, and Social History were reviewed in [employer] record.  ROS  The following are not active complaints unless bolded Hoarseness, sore throat, dysphagia, dental problems, itching, sneezing,  nasal congestion or discharge of excess mucus or purulent secretions, ear ache,   fever, chills, sweats, unintended wt loss or wt gain, classically pleuritic or exertional cp,  orthopnea pnd or arm/hand swelling  or leg swelling, presyncope, palpitations, abdominal pain, anorexia, nausea, vomiting, diarrhea  or change in bowel habits or change in bladder habits, change in stools or change in urine, dysuria, hematuria,  rash, arthralgias, visual complaints, headache, numbness, weakness or ataxia or problems with walking or coordination,  change in mood or  memory.  Current Meds  Medication Sig   acetaminophen (TYLENOL) 500 MG tablet Take 1,000 mg by mouth at bedtime.   Blood Glucose Monitoring Suppl (ACURA BLOOD GLUCOSE METER) w/Device KIT Check blood sugars twice daily. Please provide patient with meter and supplies that is covered by pt ins.   Blood Glucose Monitoring Suppl (ONE TOUCH ULTRA MINI) w/Device KIT Use to check blood sugars 3 times a day. Dx code:e11.65   brimonidine (ALPHAGAN) 0.2 % ophthalmic solution 1 drop 2 (two) times daily.   chlorpheniramine (CHLOR-TRIMETON) 4 MG tablet Take 4 mg by mouth at bedtime.   cholecalciferol (VITAMIN D) 1000 units tablet Take  2,000 Units by mouth daily.   CO ENZYME Q-10 PO Take by mouth.   cyclobenzaprine (FLEXERIL) 10 MG tablet Take 1 tablet (10 mg total) by mouth 2 (two) times daily as needed for muscle spasms.   diclofenac Sodium (VOLTAREN) 1 % GEL Apply 4 g topically 4 (four) times daily.   docusate sodium (COLACE) 100 MG capsule Take 100 mg by mouth daily.   hydrALAZINE (APRESOLINE) 100 MG tablet TAKE 1 TABLET BY MOUTH  TWICE DAILY   hydrochlorothiazide (HYDRODIURIL) 25 MG tablet Take 1 tablet (25 mg total) by mouth daily.   latanoprost (XALATAN) 0.005 % ophthalmic solution Place 1 drop into both eyes at bedtime.   levothyroxine (SYNTHROID) 88 MCG tablet Take 1 tablet (88 mcg total) by mouth daily before breakfast.   MAGNESIUM PO Take 375 mg by mouth daily.   Melatonin 10 MG CAPS Take 10 mg by mouth at bedtime.   metFORMIN (GLUCOPHAGE-XR) 500 MG 24 hr tablet Take 1 tablet (500 mg total) by mouth 2 (two) times daily.   ONETOUCH ULTRA test strip USE WITH METER TO CHECK  BLOOD SUGAR TWICE DAILY  BEFORE BREAKFAST AND BEFORE DINNER   pravastatin (PRAVACHOL) 20 MG tablet Take 1 tablet (20 mg total) by mouth every evening.   RABEprazole (ACIPHEX) 20 MG tablet Take 1 tablet (20 mg total) by mouth daily.   timolol (BETIMOL) 0.25 % ophthalmic solution Place 1 drop into both eyes daily.   Turmeric 500 MG CAPS Take 1 tablet by mouth as needed (Takes occassionally).              Past Medical History:  Diagnosis Date   Back pain    Death of child 30   Due to cord strangulation.   Diabetes mellitus without complication ([place])    Hyperlipidemia    Hypertension    Hypothyroidism    Joint pain    Osteoarthritis    Other fatigue    Shortness of breath on exertion    Thyroid condition         Objective:     01/11/2023       186  12/27/22 182 lb (82.6 kg)  12/27/22 185 lb 6.4 oz (84.1 kg)  12/01/22 184 lb (83.5 kg)      Vital signs reviewed  01/11/2023  - Note at rest 02 sats  98% on RA   General  appearance:    somber/ tearful amb bf nad/ mild pseudowheeze on fvc only     HEENT : Oropharynx  clear     Nasal turbinates nl    NECK :  without  apparent JVD/ palpable Nodes/TM    LUNGS: no acc muscle use,  Nl contour chest which is clear to A and P bilaterally with cough on  exp maneuvers   CV:  RRR  no s3 or murmur or increase  in P2, and no edema   ABD:  soft and nontender with nl inspiratory excursion in the supine position. No bruits or organomegaly appreciated   MS:  Nl gait/ ext warm without deformities Or obvious joint restrictions  calf tenderness, cyanosis or clubbing    SKIN: warm and dry without lesions    NEURO:  alert, approp, nl sensorium with  no motor or cerebellar deficits apparent.           Assessment

## 2023-01-11 ENCOUNTER — Encounter: Payer: Self-pay | Admitting: Internal Medicine

## 2023-01-11 ENCOUNTER — Ambulatory Visit: Payer: Medicare Other | Admitting: Internal Medicine

## 2023-01-11 ENCOUNTER — Ambulatory Visit (INDEPENDENT_AMBULATORY_CARE_PROVIDER_SITE_OTHER): Payer: Medicare Other

## 2023-01-11 VITALS — BP 142/90 | HR 80 | Temp 98.7°F | Ht 65.0 in | Wt 186.0 lb

## 2023-01-11 DIAGNOSIS — R058 Other specified cough: Secondary | ICD-10-CM

## 2023-01-11 DIAGNOSIS — R059 Cough, unspecified: Secondary | ICD-10-CM | POA: Diagnosis not present

## 2023-01-11 LAB — POCT EXHALED NITRIC OXIDE: FeNO level (ppb): 16

## 2023-01-11 MED ORDER — HYDRALAZINE HCL 100 MG PO TABS: 150.0000 mg | ORAL_TABLET | Freq: Two times a day (BID) | ORAL | 1 refills | Status: DC

## 2023-01-11 MED ORDER — BENZONATATE 200 MG PO CAPS: 200.0000 mg | ORAL_CAPSULE | Freq: Three times a day (TID) | ORAL | 1 refills | Status: DC | PRN

## 2023-01-11 MED ORDER — HYDROCODONE BIT-HOMATROP MBR 5-1.5 MG/5ML PO SOLN: 5.0000 mL | Freq: Four times a day (QID) | ORAL | 0 refills | Status: DC | PRN

## 2023-01-11 MED ORDER — METHYLPREDNISOLONE ACETATE 80 MG/ML IJ SUSP
120.0000 mg | Freq: Once | INTRAMUSCULAR | Status: AC
  Administered 2023-01-11: 120 mg via INTRAMUSCULAR

## 2023-01-11 MED ORDER — AMITRIPTYLINE HCL 150 MG PO TABS: 75.0000 mg | ORAL_TABLET | Freq: Every day | ORAL | 2 refills | Status: DC

## 2023-01-11 NOTE — Assessment & Plan Note (Addendum)
Onset in her 20s/ no better with allergy shots, saba/ worse with laughter/perfume and severe to point of urinary incont and vomiting  - 11/19/2022 rx aciphex and 1st gen H1 blockers per guidelines  > no better 01/11/2023  - 01/11/2023 added elavil at hs  - Sinus ct 01/11/2023 >>>  - FENO  01/11/2023 =  16  Hx has changed since intial ov to cone of daily and nightly cough x decades suggesting habitual cough that resolves only p asleep and does not typically wake her or respond to the usual meds for rhinitis/asthma or gerd.  Will complete the w/u  as above and consider early trial of gabapentin vs referral to ENT/[facility] voice center  Again strongly advised on substitute for timolol per eye doc.         Each maintenance medication was reviewed in detail including emphasizing most importantly the difference between maintenance and prns and under what circumstances the prns are to be triggered using an action plan format where appropriate.  Total time for H and P, chart review, counseling,  and generating customized AVS unique to this office visit / same day charting = > 30 min for  refractory respiratory  symptoms of uncertain etiology

## 2023-01-11 NOTE — Telephone Encounter (Signed)
Patient is returning call, states that you can leave the message on VM. Please advise

## 2023-01-11 NOTE — Telephone Encounter (Signed)
Left message for the patient to contact the office. 

## 2023-01-11 NOTE — Addendum Note (Signed)
Addended by: Whitman Hero on: 01/11/2023 12:15 PM   Modules accepted: Orders

## 2023-01-11 NOTE — Patient Instructions (Addendum)
Elavil  150  mg one quarter tablet after supper every night (stop the chlorpheniramine for now)   Hydromet  1-2 tsp at bedtime / during the day 200 mg tessalon every 6 hours as needed   Depomedrol 120 mg IM   Call your doctor about changing your timolol to betoptic  My office will be contacting you by phone for referral to sinus CT   - if you don't hear back from my office within one week please call us back or notify us thru MyChart and we'll address it right away.   Please remember to go to the  x-ray department  for your tests - we will call you with the results when they are available    Please schedule a follow up office visit in 6 weeks, call sooner if needed - bring all meds

## 2023-01-11 NOTE — Telephone Encounter (Signed)
Spoke with patient and gave her Jaquelyn Bitter recommendations.   The patient states she is currently taking the Hydralazine bid. Per Jaquelyn Bitter ok to increase med to tid. Patient declined and states she only wants to take the tablet bid and wanted to know if she could take '150mg'$  biud instead of '100mg'$  tid. Ok with Jaquelyn Bitter.   Per Jaquelyn Bitter patient advised that this could cause dizziness advised the patient to contact the office if she experiences any symptoms. She voiced understanding.   Rx(s) sent to pharmacy electronically.

## 2023-01-12 ENCOUNTER — Other Ambulatory Visit: Payer: Self-pay | Admitting: Nurse Practitioner

## 2023-01-12 DIAGNOSIS — E782 Mixed hyperlipidemia: Secondary | ICD-10-CM

## 2023-01-14 ENCOUNTER — Encounter: Payer: Self-pay | Admitting: Nurse Practitioner

## 2023-01-17 NOTE — Progress Notes (Signed)
Left detailed msg on machine ok per DPR

## 2023-01-20 ENCOUNTER — Ambulatory Visit (INDEPENDENT_AMBULATORY_CARE_PROVIDER_SITE_OTHER): Payer: Medicare Other | Admitting: Family Medicine

## 2023-01-20 ENCOUNTER — Encounter (INDEPENDENT_AMBULATORY_CARE_PROVIDER_SITE_OTHER): Payer: Self-pay | Admitting: Family Medicine

## 2023-01-20 VITALS — BP 183/91 | HR 79 | Temp 98.0°F | Ht 65.0 in | Wt 184.2 lb

## 2023-01-20 DIAGNOSIS — E669 Obesity, unspecified: Secondary | ICD-10-CM

## 2023-01-20 DIAGNOSIS — I10 Essential (primary) hypertension: Secondary | ICD-10-CM

## 2023-01-20 DIAGNOSIS — E559 Vitamin D deficiency, unspecified: Secondary | ICD-10-CM | POA: Diagnosis not present

## 2023-01-20 DIAGNOSIS — Z6830 Body mass index (BMI) 30.0-30.9, adult: Secondary | ICD-10-CM | POA: Diagnosis not present

## 2023-01-21 ENCOUNTER — Ambulatory Visit: Payer: Medicare Other | Admitting: Surgical

## 2023-01-21 ENCOUNTER — Encounter: Payer: Self-pay | Admitting: Orthopedic Surgery

## 2023-01-21 DIAGNOSIS — M1711 Unilateral primary osteoarthritis, right knee: Secondary | ICD-10-CM

## 2023-01-21 DIAGNOSIS — M170 Bilateral primary osteoarthritis of knee: Secondary | ICD-10-CM | POA: Diagnosis not present

## 2023-01-21 DIAGNOSIS — M1712 Unilateral primary osteoarthritis, left knee: Secondary | ICD-10-CM

## 2023-01-21 MED ORDER — LIDOCAINE HCL 1 % IJ SOLN
5.0000 mL | INTRAMUSCULAR | Status: AC | PRN
  Administered 2023-01-21: 5 mL

## 2023-01-21 MED ORDER — SODIUM HYALURONATE 60 MG/3ML IX PRSY
60.0000 mg | PREFILLED_SYRINGE | INTRA_ARTICULAR | Status: AC | PRN
  Administered 2023-01-21: 60 mg via INTRA_ARTICULAR

## 2023-01-21 NOTE — Progress Notes (Signed)
   Procedure Note  Patient: Deborah [name]             Date of Birth: [birth year]           MRN: [id number]             Visit Date: 01/21/2023  Procedures: Visit Diagnoses: No diagnosis found.  Large Joint Inj: bilateral knee on 01/21/2023 1:36 PM Indications: diagnostic evaluation, joint swelling and pain Details: 18 G 1.5 in needle, superolateral approach  Arthrogram: No  Medications (Right): 5 mL lidocaine 1 %; 60 mg Sodium Hyaluronate 60 MG/3ML Medications (Left): 5 mL lidocaine 1 %; 60 mg Sodium Hyaluronate 60 MG/3ML Aspirate (Left): 15 mL Outcome: tolerated well, no immediate complications Procedure, treatment alternatives, risks and benefits explained, specific risks discussed. Consent was given by the patient. Immediately prior to procedure a time out was called to verify the correct patient, procedure, equipment, support staff and site/side marked as required. Patient was prepped and draped in the usual sterile fashion.

## 2023-01-24 ENCOUNTER — Other Ambulatory Visit: Payer: Self-pay | Admitting: Internal Medicine

## 2023-01-24 ENCOUNTER — Ambulatory Visit (HOSPITAL_COMMUNITY)
Admission: RE | Admit: 2023-01-24 | Discharge: 2023-01-24 | Disposition: A | Discharge status: Home / Self Care | Payer: Medicare Other | Source: Ambulatory Visit | Attending: Internal Medicine | Admitting: Internal Medicine

## 2023-01-24 DIAGNOSIS — R058 Other specified cough: Secondary | ICD-10-CM | POA: Diagnosis not present

## 2023-01-24 DIAGNOSIS — J341 Cyst and mucocele of nose and nasal sinus: Secondary | ICD-10-CM | POA: Diagnosis not present

## 2023-01-27 ENCOUNTER — Telehealth: Payer: Self-pay | Admitting: Internal Medicine

## 2023-01-27 DIAGNOSIS — J329 Chronic sinusitis, unspecified: Secondary | ICD-10-CM

## 2023-01-27 NOTE — Telephone Encounter (Signed)
Called patient but she did not answer. Left message for her to call us back.  

## 2023-01-27 NOTE — Telephone Encounter (Signed)
Patient is returning phone call. Patient phone number is [phone]. 

## 2023-01-28 NOTE — Telephone Encounter (Signed)
Spoke with patient in regards to CT results. Advised Dr. Lenard Forth recommends referral to ENT patient agrees. Order for referral has been placed. Nothing further needed.

## 2023-01-28 NOTE — Telephone Encounter (Signed)
Attempted to call pt but unable to reach. Left message to return call.  

## 2023-01-28 NOTE — Telephone Encounter (Signed)
Patient is returning phone call. Patient phone number is [phone].

## 2023-01-28 NOTE — Telephone Encounter (Signed)
ATC X1 LVM for patient to call the office back

## 2023-02-01 NOTE — Progress Notes (Unsigned)
Chief Complaint:   OBESITY Deborah [name] is here to discuss her progress with her obesity treatment plan along with follow-up of her obesity related diagnoses. Deborah [name] is on the Category 2 Plan and keeping a food journal and adhering to recommended goals of 400-500 calories and 35+ grams of protein at supper daily and states she is following her eating plan approximately 80% of the time. Deborah [name] states she is doing Chief of Staff for 45 minutes 3 times per week.  Today's visit was #: 28 Starting weight: 204 lbs Starting date: 02/06/2021 Today's weight: 184 lbs Today's date: 01/20/2023 Total lbs lost to date: 20 Total lbs lost since last in-office visit: 0  Interim History: Deborah [name] is working on portion control and Psychologist, counselling. She is trying to decrease PM snacking. She is doing group exercises but her knees have been hurting. She will having "gel" injections tomorrow.   Subjective:   1. Essential hypertension Deborah [name] is having her blood pressure medications adjusted by Cardiology. Her spironolactone was discontinued and she is retaining a bit of fluid today. She states her blood pressure at home are much lower than when in our office.   2. Vitamin D deficiency Deborah [name] is on Vitamin D, and her last level was at goal. She has no signs of over-replacement and could be low due to Winter.   Assessment/Plan:   1. Essential hypertension Deborah [name] is to bring her blood pressure cuff into her next visit and we will compare blood pressure readings. Blood pressure elevation may be in part to white coat hypertension. We will follow-up in 1 month.   2. Vitamin D deficiency Deborah [name] will continue Vitamin D OTC 2,000 IU daily, and we will recheck labs in 2 months.   3. BMI 30.0-30.9,adult  4. Obesity, Beginning BMI 35.02 Deborah [name] is currently in the action stage of change. As such, her goal is to continue with weight loss efforts. She has agreed to the Category 2 Plan.   Exercise goals:  As is.   Behavioral modification strategies: no skipping meals and meal planning and cooking strategies.  Deborah [name] has agreed to follow-up with our clinic in 4 weeks. She was informed of the importance of frequent follow-up visits to maximize her success with intensive lifestyle modifications for her multiple health conditions.   Objective:   Blood pressure (!) 183/91, pulse 79, temperature 98 F (36.7 C), height 5' 5"$  (1.651 m), weight 184 lb 3.2 oz (83.6 kg), SpO2 99 %. Body mass index is 30.65 kg/m.  General: Cooperative, alert, well developed, in no acute distress. HEENT: Conjunctivae and lids unremarkable. Cardiovascular: Regular rhythm.  Lungs: Normal work of breathing. Neurologic: No focal deficits.   Lab Results  Component Value Date   CREATININE 0.67 11/03/2022   BUN 20 11/03/2022   NA 137 11/03/2022   K 4.5 11/03/2022   CL 95 (L) 11/03/2022   CO2 24 11/03/2022   Lab Results  Component Value Date   ALT 17 11/03/2022   AST 15 11/03/2022   ALKPHOS 93 11/03/2022   BILITOT 0.5 11/03/2022   Lab Results  Component Value Date   HGBA1C 6.0 (H) 11/02/2022   HGBA1C 6.0 (H) 07/01/2022   HGBA1C 6.0 (H) 03/01/2022   HGBA1C 6.0 (H) 10/28/2021   HGBA1C 5.9 (H) 07/02/2021   Lab Results  Component Value Date   INSULIN 9.7 11/03/2022   INSULIN 31.4 (H) 01/27/2021   Lab Results  Component Value Date   TSH 0.575 11/02/2022   Lab Results  Component Value Date   CHOL 214 (H) 11/02/2022   HDL 60 11/02/2022   LDLCALC 139 (H) 11/02/2022   TRIG 84 11/02/2022   CHOLHDL 3.6 11/02/2022   Lab Results  Component Value Date   VD25OH 52.2 11/03/2022   VD25OH 52.0 02/04/2022   VD25OH 61.9 07/02/2021   Lab Results  Component Value Date   WBC 4.8 11/02/2022   HGB 11.7 11/02/2022   HCT 34.2 11/02/2022   MCV 85 11/02/2022   PLT 446 11/02/2022   No results found for: "IRON", "TIBC", "FERRITIN"  Attestation Statements:   Reviewed by clinician on day of visit: allergies,  medications, problem list, medical history, surgical history, family history, social history, and previous encounter notes.  Time spent on visit including pre-visit chart review and post-visit care and charting was 30 minutes.   I, Trixie Dredge, am acting as transcriptionist for Dennard Nip, MD.  I have reviewed the above documentation for accuracy and completeness, and I agree with the above. -  Dennard Nip, MD

## 2023-02-02 ENCOUNTER — Other Ambulatory Visit: Payer: Self-pay | Admitting: Internal Medicine

## 2023-02-08 ENCOUNTER — Telehealth: Payer: Self-pay | Admitting: Nurse Practitioner

## 2023-02-08 ENCOUNTER — Encounter: Payer: Self-pay | Admitting: Internal Medicine

## 2023-02-08 DIAGNOSIS — I10 Essential (primary) hypertension: Secondary | ICD-10-CM

## 2023-02-08 DIAGNOSIS — H2513 Age-related nuclear cataract, bilateral: Secondary | ICD-10-CM | POA: Diagnosis not present

## 2023-02-08 DIAGNOSIS — H401131 Primary open-angle glaucoma, bilateral, mild stage: Secondary | ICD-10-CM | POA: Diagnosis not present

## 2023-02-08 LAB — HM DIABETES EYE EXAM

## 2023-02-08 MED ORDER — SPIRONOLACTONE 25 MG PO TABS: 25.0000 mg | ORAL_TABLET | Freq: Every day | ORAL | 1 refills | Status: DC

## 2023-02-08 NOTE — Telephone Encounter (Signed)
Please advise Ms. Knoedler to continue to document her blood pressures and we can reorder her spironolactone 25 mg daily.  We will have her come in for BMET in 2 weeks.  Please let her know that if her blood pressures do not improve we will refer her to the hypertension clinic.  Please let me know if you have any additional questions.  Ambrose Pancoast, NP

## 2023-02-08 NOTE — Telephone Encounter (Signed)
Patient returning call.

## 2023-02-08 NOTE — Addendum Note (Signed)
Addended by: Whitman Hero on: 02/08/2023 03:56 PM   Modules accepted: Orders

## 2023-02-08 NOTE — Telephone Encounter (Signed)
Left detailed message with results, ok per DPR, and to call back if any questions.  Rx(s) sent to pharmacy electronically.

## 2023-02-09 NOTE — Telephone Encounter (Signed)
Mychart message sent by pt:  [facility] (supporting Tanda Rockers, MD)14 hours ago (7:24 PM)    !.      I gave the Amitriptylline a good two weeks and I had to stop taking it because I almost fell 3 times,  getting up going to the bathroom at night I truly had a loss of balance, no coordination and the nightmares were awlfull    2.   I stop taking the Pepscid because I was throwing up my supper several times, The nausea and vomiting was terrible. 3.   I  did not purchase the Tessalon Pearls, my insurance did not pay for them, the cost was too high for me 59.00 for 30 days, I have them in the past and they don't help me. 4  I had an appointment with the Eye [facility]  and they stopped the Timol, but started me on 2 different eye drops Thanks  [name]    Dr. Melvyn Novas, please advise.

## 2023-02-09 NOTE — Telephone Encounter (Signed)
Spoke with the patient and informed her that the Spironolactone was sent to [facility]. Aldo informed the patient that Jaquelyn Bitter would like for her to have labs 2 weeks after starting the Spironolactone. Patient requested to have labs drawn at [facility]. Advised the patient that was fine.   Patient voiced understanding.

## 2023-02-09 NOTE — Telephone Encounter (Signed)
Left message for the patient to contact the office.

## 2023-02-09 NOTE — Telephone Encounter (Signed)
Duplicate message.      Closing encounter.

## 2023-02-11 ENCOUNTER — Other Ambulatory Visit: Payer: Self-pay | Admitting: Nurse Practitioner

## 2023-02-15 ENCOUNTER — Ambulatory Visit (INDEPENDENT_AMBULATORY_CARE_PROVIDER_SITE_OTHER): Payer: Medicare Other | Admitting: Family Medicine

## 2023-02-15 ENCOUNTER — Encounter (INDEPENDENT_AMBULATORY_CARE_PROVIDER_SITE_OTHER): Payer: Self-pay | Admitting: Family Medicine

## 2023-02-15 VITALS — BP 160/83 | HR 81 | Temp 97.9°F | Ht 65.0 in | Wt 182.0 lb

## 2023-02-15 DIAGNOSIS — I10 Essential (primary) hypertension: Secondary | ICD-10-CM

## 2023-02-15 DIAGNOSIS — E669 Obesity, unspecified: Secondary | ICD-10-CM

## 2023-02-15 DIAGNOSIS — Z6830 Body mass index (BMI) 30.0-30.9, adult: Secondary | ICD-10-CM | POA: Diagnosis not present

## 2023-02-15 DIAGNOSIS — G4709 Other insomnia: Secondary | ICD-10-CM

## 2023-02-21 ENCOUNTER — Other Ambulatory Visit: Payer: Self-pay

## 2023-02-21 DIAGNOSIS — R058 Other specified cough: Secondary | ICD-10-CM

## 2023-02-21 DIAGNOSIS — R0602 Shortness of breath: Secondary | ICD-10-CM

## 2023-02-21 NOTE — Progress Notes (Signed)
pft  

## 2023-02-21 NOTE — Progress Notes (Unsigned)
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 > Shots x one year no better        History of Present Illness  11/19/2022  Pulmonary/ 1st office eval/Cherron Blitzer for cough x decades / worse in winter  Chief Complaint  Patient presents with   Consult    Persistent dry cough  Dyspnea:  some power walking more limited by knees than breathing  Cough: dry, worse at hs - coughs for an hour and then no more cough / causes urination and vomiting last episode 2 d prior to OV   Sleep: flat pillow  SABA use: none - they  don't  work Laughing brings it on  / perfumes also  Prednisone seems to help Prilosec made her sick  Rec If possible have your eye doctor change you from timolol to betaoptic  Aciphex 20 mg Take 30-60 min before first meal of the day and add pepcid 20 mg one after supper For drainage / throat tickle try take CHLORPHENIRAMINE  4 mg  GERD diet       01/11/2023  f/u ov/Kambrea Carrasco re: recurrent cough s/p Flu A> tamaflu  maint on aciphex/pepcid /h1 x 2 hs the cough was not improved prior to the flu and then much worse   Chief Complaint  Patient presents with   Follow-up    Dx flu 12/09/22.  Took Tamiflu.  Still coughing with clear mucus.  Chest feels sore from coughing continuously.  Would like prednisone and more cough syrup with codeine  Dyspnea:  staying inside  Cough: minimal clear mucus  Sleeping: cough worse for naps and hs (anytime lies down)  SABA use: none /"they never work"  (on timolol x decades though and still is) 02: none  Rec Elavil  150   mg one quarter tablet after supper every night (stop the chlorpheniramine for now)  Hydromet  1-2 tsp at bedtime / during the day 200 mg tessalon every 6 hours as needed  Depomedrol 120 mg IM  Call your doctor about changing your timolol to betoptic Sinus CT  done 01/24/23 Scattered mild paranasal sinus mucosal thickening and bubbly opacity does not appear significantly changed from a 2016 Head CT,  Please schedule a follow up office visit in 6 weeks, call sooner if needed - bring all meds    02/22/2023  f/u ov/Kinley Dozier re: ***   maint on ***  No chief complaint on file.   Dyspnea:  *** Cough: *** Sleeping: *** SABA use: *** 02: *** Covid status:   *** Lung cancer screening :  ***    No obvious day to day or daytime variability or assoc excess/ purulent sputum or mucus plugs or hemoptysis or cp or chest tightness, subjective wheeze or overt sinus or hb symptoms.   *** without nocturnal  or early am exacerbation  of respiratory  c/o's or need for noct saba. Also denies any obvious fluctuation of symptoms with weather or environmental changes or other aggravating or alleviating factors except as  outlined above   No unusual exposure hx or h/o childhood pna/ asthma or knowledge of premature birth.  Current Allergies, Complete Past Medical History, Past Surgical History, Family History, and Social History were reviewed in [employer] record.  ROS  The following are not active complaints unless bolded Hoarseness, sore throat, dysphagia, dental problems, itching, sneezing,  nasal congestion or discharge of excess mucus or purulent secretions, ear ache,   fever, chills, sweats, unintended wt loss or wt gain, classically pleuritic or exertional cp,  orthopnea pnd or arm/hand swelling  or leg swelling, presyncope, palpitations, abdominal pain, anorexia, nausea, vomiting, diarrhea  or change in bowel habits or change in bladder habits, change in stools or change in urine,  dysuria, hematuria,  rash, arthralgias, visual complaints, headache, numbness, weakness or ataxia or problems with walking or coordination,  change in mood or  memory.        No outpatient medications have been marked as taking for the 02/22/23 encounter (Appointment) with Tanda Rockers, MD.                 Past Medical History:  Diagnosis Date   Back pain    Death of child 38   Due to cord strangulation.   Diabetes mellitus without complication ([place])    Hyperlipidemia    Hypertension    Hypothyroidism    Joint pain    Osteoarthritis    Other fatigue    Shortness of breath on exertion    Thyroid condition         Objective:    02/22/2023          ***  01/11/2023       186  12/27/22 182 lb (82.6 kg)  12/27/22 185 lb 6.4 oz (84.1 kg)  12/01/22 184 lb (83.5 kg)       Vital signs reviewed  02/22/2023  - Note at rest 02 sats  ***% on ***   General appearance:    ***    cough on  exp maneuvers***           Assessment

## 2023-02-22 ENCOUNTER — Encounter: Payer: Self-pay | Admitting: Pharmacist

## 2023-02-22 ENCOUNTER — Ambulatory Visit (INDEPENDENT_AMBULATORY_CARE_PROVIDER_SITE_OTHER): Payer: Medicare Other | Admitting: Internal Medicine

## 2023-02-22 ENCOUNTER — Encounter: Payer: Self-pay | Admitting: Internal Medicine

## 2023-02-22 ENCOUNTER — Encounter: Payer: Self-pay | Admitting: Nurse Practitioner

## 2023-02-22 VITALS — BP 124/86 | HR 75 | Temp 98.0°F | Ht 65.0 in | Wt 186.0 lb

## 2023-02-22 DIAGNOSIS — R058 Other specified cough: Secondary | ICD-10-CM

## 2023-02-22 DIAGNOSIS — R0602 Shortness of breath: Secondary | ICD-10-CM | POA: Diagnosis not present

## 2023-02-22 LAB — PULMONARY FUNCTION TEST
DL/VA % pred: 130 %
DL/VA: 5.35 ml/min/mmHg/L
DLCO cor % pred: 88 %
DLCO cor: 17.83 ml/min/mmHg
DLCO unc % pred: 88 %
DLCO unc: 17.83 ml/min/mmHg
FEF 25-75 Pre: 1.93 L/sec
FEF2575-%Pred-Pre: 98 %
FEV1-%Pred-Pre: 70 %
FEV1-Pre: 1.67 L
FEV1FVC-%Pred-Pre: 116 %
FEV6-%Pred-Pre: 63 %
FEV6-Pre: 1.89 L
FEV6FVC-%Pred-Pre: 104 %
FVC-%Pred-Pre: 60 %
FVC-Pre: 1.89 L
Pre FEV1/FVC ratio: 88 %
Pre FEV6/FVC Ratio: 100 %
RV % pred: 80 %
RV: 1.79 L
TLC % pred: 73 %
TLC: 3.81 L

## 2023-02-22 MED ORDER — RABEPRAZOLE SODIUM 20 MG PO TBEC: DELAYED_RELEASE_TABLET | ORAL | 11 refills | Status: DC

## 2023-02-22 MED ORDER — GABAPENTIN 100 MG PO CAPS: 100.0000 mg | ORAL_CAPSULE | Freq: Four times a day (QID) | ORAL | 2 refills | Status: DC

## 2023-02-22 NOTE — Progress Notes (Signed)
Spiro, DLCO and PLETH performed today. Patient refused last Arlyce Harman with Albuterol. Physician was informed.

## 2023-02-22 NOTE — Patient Instructions (Addendum)
Aciphex 20 mg Take 30- 60 min before your first and last meals of the day   Gabapentin 100 mg  1 an hour before bedtime x one week then increase by one a week to a max  of 400 mg at bedtime or a maximum of 300 mg 4x daily = 1200 mg per 24 hours  My office will be contacting you by phone for referral to [facility]   - if you don't hear back from my office within one week please call us back or notify us thru MyChart and we'll address it right away.   Pulmoanry follow up is as needed

## 2023-02-22 NOTE — Patient Instructions (Signed)
Spiro, DLCO and PLETH performed today. Patient refused last Deborah [name] with Albuterol. Physician was informed.

## 2023-02-23 ENCOUNTER — Encounter: Payer: Self-pay | Admitting: Internal Medicine

## 2023-02-23 ENCOUNTER — Other Ambulatory Visit: Payer: Self-pay

## 2023-02-23 DIAGNOSIS — E782 Mixed hyperlipidemia: Secondary | ICD-10-CM

## 2023-02-23 MED ORDER — PRAVASTATIN SODIUM 20 MG PO TABS: 20.0000 mg | ORAL_TABLET | Freq: Every evening | ORAL | 2 refills | Status: DC

## 2023-02-23 NOTE — Assessment & Plan Note (Signed)
Onset in her 20s/ no better with allergy shots, saba/ worse with laughter/perfume and severe to point of urinary incont and vomiting  - 11/19/2022 rx aciphex and 1st gen H1 blockers per guidelines  > no better 01/11/2023  - 01/11/2023 added elavil at hs  - Sinus ct  01/24/23  c/w bubbly secretions > ent eval if not improving  - FENO  01/11/2023 =  16  - 02/22/2023 referred to ENT ([facility]) with gabapentin trial in meantime  and double aciphex to bid  ac  Not sure the sinus ct findings have anything to do with the cough which is more habitual than physiologic - hycodan is the only thing that helps but does not result in a throat full of mucus in am which might be the case of just suppress an abnormal pnds due to sinus dz   Rec trial of gabapentin starting with 100 mg and max of 1200 mg daily   Discussed in detail all the  indications, usual  risks and alternatives  relative to the benefits with patient who agrees to proceed with Rx as outlined.             Each maintenance medication was reviewed in detail including emphasizing most importantly the difference between maintenance and prns and under what circumstances the prns are to be triggered using an action plan format where appropriate.  Total time for H and P, chart review, counseling,   and generating customized AVS unique to this office visit / same day charting = 21 min

## 2023-02-28 ENCOUNTER — Encounter: Payer: Self-pay | Admitting: Nurse Practitioner

## 2023-03-01 ENCOUNTER — Other Ambulatory Visit: Payer: Self-pay | Admitting: Nurse Practitioner

## 2023-03-01 DIAGNOSIS — R058 Other specified cough: Secondary | ICD-10-CM

## 2023-03-01 MED ORDER — HYDROCODONE BIT-HOMATROP MBR 5-1.5 MG/5ML PO SOLN: 5.0000 mL | Freq: Four times a day (QID) | ORAL | 0 refills | Status: DC | PRN

## 2023-03-02 ENCOUNTER — Encounter: Payer: Self-pay | Admitting: Internal Medicine

## 2023-03-02 NOTE — Telephone Encounter (Signed)
Received the following message from patient:   "Dr. Melvyn Novas Letting you know that I tried the Gabapentin and it is not for me. I was Dizzy and clumsy every morning, so anxious, but the worst part was a severe headache in the morning.   I have not received any information about the other physician you are referring me to  or an appointment.  Please send me the information or a My chart message   Deborah [name]"  I provided her with the information for Dr. Bettina Gavia office.   Dr. Melvyn Novas, can you please advise on the gabapentin? Thanks!

## 2023-03-02 NOTE — Telephone Encounter (Signed)
Sorry to hear that - just to confirm what we talked about was only starting with 100 mg total at bedtime for a week before increasing by one pill each week?  I don't have any other suggestions in meantime other than take the chlorpheniramine 4 mg x 2 at least an hour before bed and repeat in  1 or 2 4 hours later with 8 inch bed blocks under head of bed  Also happy to have her see one of the other pulmonary docs though none of our guys have a special interest in cough and best bet is Dr Neldon Mc with allergy (ok to refer if she desires while waiting for ENT appt)

## 2023-03-03 ENCOUNTER — Other Ambulatory Visit: Payer: Self-pay | Admitting: Nurse Practitioner

## 2023-03-03 ENCOUNTER — Ambulatory Visit (INDEPENDENT_AMBULATORY_CARE_PROVIDER_SITE_OTHER): Payer: Medicare Other | Admitting: Nurse Practitioner

## 2023-03-03 ENCOUNTER — Encounter: Payer: Self-pay | Admitting: Nurse Practitioner

## 2023-03-03 VITALS — BP 140/90 | HR 76 | Temp 98.3°F | Ht 65.0 in | Wt 183.0 lb

## 2023-03-03 DIAGNOSIS — I10 Essential (primary) hypertension: Secondary | ICD-10-CM

## 2023-03-03 DIAGNOSIS — Z6830 Body mass index (BMI) 30.0-30.9, adult: Secondary | ICD-10-CM

## 2023-03-03 DIAGNOSIS — R7303 Prediabetes: Secondary | ICD-10-CM

## 2023-03-03 DIAGNOSIS — E782 Mixed hyperlipidemia: Secondary | ICD-10-CM | POA: Diagnosis not present

## 2023-03-03 DIAGNOSIS — R058 Other specified cough: Secondary | ICD-10-CM

## 2023-03-03 DIAGNOSIS — G4700 Insomnia, unspecified: Secondary | ICD-10-CM | POA: Diagnosis not present

## 2023-03-03 DIAGNOSIS — Z2821 Immunization not carried out because of patient refusal: Secondary | ICD-10-CM

## 2023-03-03 DIAGNOSIS — R053 Chronic cough: Secondary | ICD-10-CM

## 2023-03-03 MED ORDER — TRAZODONE HCL 50 MG PO TABS: ORAL_TABLET | ORAL | 1 refills | Status: DC

## 2023-03-03 NOTE — Progress Notes (Signed)
Barnet Glasgow Martin,acting as a Education administrator for Minette Brine, FNP.,have documented all relevant documentation on the behalf of Minette Brine, FNP,as directed by  Minette Brine, FNP while in the presence of Minette Brine, [place].    Subjective:     Patient ID: Deborah [name] , female    DOB: 27-Oct-1951 , 72 y.o.   MRN: [id number]   Chief Complaint  Patient presents with   Hypertension    HPI  Patient presents today for a BP check and prediabetes. She took her blood pressure medications this morning at 7a. Patient states compliance with medications and has no other concerns today. Continues to go to [facility] and eating healthier  She had been taken off spironolactone for a few weeks but restarted a few weeks ago.   She has not been able to tolerate amitriptylline and gabapentin from Dr Melvyn Novas.      Past Medical History:  Diagnosis Date   Back pain    Death of child 89   Due to cord strangulation.   Diabetes mellitus without complication (HCC)    Hyperlipidemia    Hypertension    Hypothyroidism    Joint pain    Osteoarthritis    Other fatigue    Shortness of breath on exertion    Thyroid condition      Family History  Problem Relation Age of Onset   COPD Mother    Diabetes Mother    Hypertension Mother    Obesity Mother    Heart disease Father    Hypertension Father    Glaucoma Father      Current Outpatient Medications:    acetaminophen (TYLENOL) 500 MG tablet, Take 1,000 mg by mouth at bedtime., Disp: , Rfl:    Blood Glucose Monitoring Suppl (ACURA BLOOD GLUCOSE METER) w/Device KIT, Check blood sugars twice daily. Please provide patient with meter and supplies that is covered by pt ins., Disp: 1 kit, Rfl: 0   Blood Glucose Monitoring Suppl (ONE TOUCH ULTRA MINI) w/Device KIT, Use to check blood sugars 3 times a day. Dx code:e11.65, Disp: 1 kit, Rfl: 0   cholecalciferol (VITAMIN D) 1000 units tablet, Take 2,000 Units by mouth daily., Disp: , Rfl:     CO ENZYME Q-10 PO, Take by mouth., Disp: , Rfl:    cyclobenzaprine (FLEXERIL) 10 MG tablet, Take 1 tablet (10 mg total) by mouth 2 (two) times daily as needed for muscle spasms., Disp: 20 tablet, Rfl: 0   diclofenac Sodium (VOLTAREN) 1 % GEL, Apply 4 g topically 4 (four) times daily., Disp: 100 g, Rfl: 0   docusate sodium (COLACE) 100 MG capsule, Take 100 mg by mouth daily., Disp: , Rfl:    glucose blood (ONETOUCH ULTRA) test strip, USE WITH METER TO CHECK BLOOD SUGAR BEFORE BREAKFAST AND BEFORE DINNER DAILY, Disp: 200 strip, Rfl: 2   hydrALAZINE (APRESOLINE) 100 MG tablet, Take 1.5 tablets (150 mg total) by mouth 2 (two) times daily., Disp: 270 tablet, Rfl: 1   hydrochlorothiazide (HYDRODIURIL) 25 MG tablet, TAKE 1 TABLET BY MOUTH DAILY, Disp: 90 tablet, Rfl: 1   HYDROcodone bit-homatropine (HYCODAN) 5-1.5 MG/5ML syrup, Take 5 mLs by mouth every 6 (six) hours as needed for cough., Disp: 240 mL, Rfl: 0   levothyroxine (SYNTHROID) 88 MCG tablet, Take 1 tablet (88 mcg total) by mouth daily before breakfast., Disp: 100 tablet, Rfl: 1   MAGNESIUM PO, Take 375 mg by mouth daily., Disp: , Rfl:    Melatonin 10 MG CAPS,  Take 10 mg by mouth at bedtime., Disp: , Rfl:    metFORMIN (GLUCOPHAGE-XR) 500 MG 24 hr tablet, Take 1 tablet (500 mg total) by mouth 2 (two) times daily., Disp: 180 tablet, Rfl: 0   pravastatin (PRAVACHOL) 20 MG tablet, Take 1 tablet (20 mg total) by mouth every evening., Disp: 100 tablet, Rfl: 2   RABEprazole (ACIPHEX) 20 MG tablet, Take 30- 60 min before your first and last meals of the day, Disp: 60 tablet, Rfl: 11   spironolactone (ALDACTONE) 25 MG tablet, Take 1 tablet (25 mg total) by mouth daily., Disp: 90 tablet, Rfl: 1   traZODone (DESYREL) 50 MG tablet, Take 1/2 tablet by mouth at hs, Disp: 30 tablet, Rfl: 1   Turmeric 500 MG CAPS, Take 1 tablet by mouth as needed (Takes occassionally)., Disp: , Rfl:    gabapentin (NEURONTIN) 100 MG capsule, Take 1 capsule (100 mg total) by mouth  4 (four) times daily. (Patient not taking: Reported on 03/03/2023), Disp: 120 capsule, Rfl: 2   Allergies  Allergen Reactions   Amlodipine     headache   Aspartame And Phenylalanine Other (See Comments)    Causes migraines   Bidil [Isosorb Dinitrate-Hydralazine]     headache   Elavil [Amitriptyline] Other (See Comments)    Loss of balance, hallucinations   Erythromycin     Ask patient to specify type/severity/reaction. Not indicated on medical history form dated 11/04/10.   Fentanyl     Causes panic attacks   Ibuprofen     Ask patient to specify type/severity/reaction. Not noted on medical history form dated 11/04/10.   Lisinopril Cough   Metoprolol Other (See Comments)   Phenylalanine Other (See Comments)    Causes migraine   Prilosec [Omeprazole] Nausea And Vomiting   Rosuvastatin Other (See Comments)   Tramadol Nausea And Vomiting   Valsartan Cough   Verapamil Nausea And Vomiting   Zantac [Ranitidine Hcl]      Review of Systems  Constitutional: Negative.  Negative for fatigue.  HENT: Negative.    Eyes: Negative.   Respiratory: Negative.    Cardiovascular: Negative.  Negative for chest pain, palpitations and leg swelling.  Gastrointestinal: Negative.   Endocrine: Negative.  Negative for polydipsia, polyphagia and polyuria.  Genitourinary: Negative.   Musculoskeletal: Negative.   Skin: Negative.   Allergic/Immunologic: Negative.   Neurological: Negative.  Negative for dizziness and headaches.  Hematological: Negative.   Psychiatric/Behavioral: Negative.       Today's Vitals   03/03/23 0844 03/03/23 0940  BP: (!) 150/90 (!) 140/90  Pulse: 76   Temp: 98.3 F (36.8 C)   TempSrc: Oral   Weight: 183 lb (83 kg)   Height: 5\' 5"  (1.651 m)   PainSc: 0-No pain    Body mass index is 30.45 kg/m.   Objective:  Physical Exam Vitals reviewed.  Constitutional:      General: She is not in acute distress.    Appearance: Normal appearance.  Cardiovascular:     Rate  and Rhythm: Normal rate and regular rhythm.     Pulses: Normal pulses.     Heart sounds: Normal heart sounds. No murmur heard. Pulmonary:     Effort: Pulmonary effort is normal. No respiratory distress.     Breath sounds: Normal breath sounds. No wheezing.  Skin:    Capillary Refill: Capillary refill takes less than 2 seconds.  Neurological:     General: No focal deficit present.     Mental Status: She is alert and  oriented to person, place, and time.     Cranial Nerves: No cranial nerve deficit.     Motor: No weakness.  Psychiatric:        Mood and Affect: Mood normal.        Behavior: Behavior normal.        Thought Content: Thought content normal.        Judgment: Judgment normal.         Assessment And Plan:     1. Essential hypertension Comments: Blood pressure is elevated this visit, repeat is slightly better continue focusing on low salt diet. continue f/u with Cardiology had some changes recent weeks. - BMP8+eGFR  2. Mixed hyperlipidemia Comments: Cholesterol levels are stable, continu curent medications, tolerating well. - Lipid panel  3. Prediabetes Comments: Continue medications, tolerating well. - Hemoglobin A1c  4. Insomnia, unspecified type Comments: Will restart Trazadone as the melatonin was not effective. - traZODone (DESYREL) 50 MG tablet; Take 1/2 tablet by mouth at hs  Dispense: 30 tablet; Refill: 1  5. BMI 30.0-30.9,adult Continue with healthy weight and wellness. She is encouraged to strive for BMI less than 30 to decrease cardiac risk. Advised to aim for at least 150 minutes of exercise per week.  6. Persistent cough  7. Upper airway cough syndrome Comments: Has seen Pulmonology however she does not feel the interventions and medications started have not been effective for her cough, cough syrup works best  8. COVID-19 vaccination declined Comments: Not interested at this time.     Patient was given opportunity to ask questions. Patient  verbalized understanding of the plan and was able to repeat key elements of the plan. All questions were answered to their satisfaction.  Minette Brine, FNP    I, Minette Brine, FNP, have reviewed all documentation for this visit. The documentation on 03/03/23 for the exam, diagnosis, procedures, and orders are all accurate and complete.  IF YOU HAVE BEEN REFERRED TO A SPECIALIST, IT MAY TAKE 1-2 WEEKS TO SCHEDULE/PROCESS THE REFERRAL. IF YOU HAVE NOT HEARD FROM US/SPECIALIST IN TWO WEEKS, PLEASE GIVE [place] A CALL AT [phone].   THE PATIENT IS ENCOURAGED TO PRACTICE SOCIAL DISTANCING DUE TO THE COVID-19 PANDEMIC.

## 2023-03-03 NOTE — Patient Instructions (Signed)
Hypertension, Adult ?Hypertension is another name for high blood pressure. High blood pressure forces your heart to work harder to pump blood. This can cause problems over time. ?There are two numbers in a blood pressure reading. There is a top number (systolic) over a bottom number (diastolic). It is best to have a blood pressure that is below 120/80. ?What are the causes? ?The cause of this condition is not known. Some other conditions can lead to high blood pressure. ?What increases the risk? ?Some lifestyle factors can make you more likely to develop high blood pressure: ?Smoking. ?Not getting enough exercise or physical activity. ?Being overweight. ?Having too much fat, sugar, calories, or salt (sodium) in your diet. ?Drinking too much alcohol. ?Other risk factors include: ?Having any of these conditions: ?Heart disease. ?Diabetes. ?High cholesterol. ?Kidney disease. ?Obstructive sleep apnea. ?Having a family history of high blood pressure and high cholesterol. ?Age. The risk increases with age. ?Stress. ?What are the signs or symptoms? ?High blood pressure may not cause symptoms. Very high blood pressure (hypertensive crisis) may cause: ?Headache. ?Fast or uneven heartbeats (palpitations). ?Shortness of breath. ?Nosebleed. ?Vomiting or feeling like you may vomit (nauseous). ?Changes in how you see. ?Very bad chest pain. ?Feeling dizzy. ?Seizures. ?How is this treated? ?This condition is treated by making healthy lifestyle changes, such as: ?Eating healthy foods. ?Exercising more. ?Drinking less alcohol. ?Your doctor may prescribe medicine if lifestyle changes do not help enough and if: ?Your top number is above 130. ?Your bottom number is above 80. ?Your personal target blood pressure may vary. ?Follow these instructions at home: ?Eating and drinking ? ?If told, follow the DASH eating plan. To follow this plan: ?Fill one half of your plate at each meal with fruits and vegetables. ?Fill one fourth of your plate  at each meal with whole grains. Whole grains include whole-wheat pasta, brown rice, and whole-grain bread. ?Eat or drink low-fat dairy products, such as skim milk or low-fat yogurt. ?Fill one fourth of your plate at each meal with low-fat (lean) proteins. Low-fat proteins include fish, chicken without skin, eggs, beans, and tofu. ?Avoid fatty meat, cured and processed meat, or chicken with skin. ?Avoid pre-made or processed food. ?Limit the amount of salt in your diet to less than 1,500 mg each day. ?Do not drink alcohol if: ?Your doctor tells you not to drink. ?You are pregnant, may be pregnant, or are planning to become pregnant. ?If you drink alcohol: ?Limit how much you have to: ?0-1 drink a day for women. ?0-2 drinks a day for men. ?Know how much alcohol is in your drink. In the [place] one drink equals one 12 oz bottle of beer (355 mL), one 5 oz glass of wine (148 mL), or one 1? oz glass of hard liquor (44 mL). ?Lifestyle ? ?Work with your doctor to stay at a healthy weight or to lose weight. Ask your doctor what the best weight is for you. ?Get at least 30 minutes of exercise that causes your heart to beat faster (aerobic exercise) most days of the week. This may include walking, swimming, or biking. ?Get at least 30 minutes of exercise that strengthens your muscles (resistance exercise) at least 3 days a week. This may include lifting weights or doing Pilates. ?Do not smoke or use any products that contain nicotine or tobacco. If you need help quitting, ask your doctor. ?Check your blood pressure at home as told by your doctor. ?Keep all follow-up visits. ?Medicines ?Take over-the-counter and prescription medicines   only as told by your doctor. Follow directions carefully. ?Do not skip doses of blood pressure medicine. The medicine does not work as well if you skip doses. Skipping doses also puts you at risk for problems. ?Ask your doctor about side effects or reactions to medicines that you should watch  for. ?Contact a doctor if: ?You think you are having a reaction to the medicine you are taking. ?You have headaches that keep coming back. ?You feel dizzy. ?You have swelling in your ankles. ?You have trouble with your vision. ?Get help right away if: ?You get a very bad headache. ?You start to feel mixed up (confused). ?You feel weak or numb. ?You feel faint. ?You have very bad pain in your: ?Chest. ?Belly (abdomen). ?You vomit more than once. ?You have trouble breathing. ?These symptoms may be an emergency. Get help right away. Call 911. ?Do not wait to see if the symptoms will go away. ?Do not drive yourself to the hospital. ?Summary ?Hypertension is another name for high blood pressure. ?High blood pressure forces your heart to work harder to pump blood. ?For most people, a normal blood pressure is less than 120/80. ?Making healthy choices can help lower blood pressure. If your blood pressure does not get lower with healthy choices, you may need to take medicine. ?This information is not intended to replace advice given to you by your health care provider. Make sure you discuss any questions you have with your health care provider. ?Document Revised: 09/24/2021 Document Reviewed: 09/24/2021 ?Elsevier Patient Education ? 2023 [employer] ? ?

## 2023-03-04 LAB — LIPID PANEL
Chol/HDL Ratio: 2.9 ratio (ref 0.0–4.4)
Cholesterol, Total: 197 mg/dL (ref 100–199)
HDL: 67 mg/dL (ref >39)
LDL Chol Calc (NIH): 115 mg/dL — ABNORMAL HIGH (ref 0–99)
Triglycerides: 84 mg/dL (ref 0–149)
VLDL Cholesterol Cal: 15 mg/dL (ref 5–40)

## 2023-03-04 LAB — BMP8+EGFR
BUN/Creatinine Ratio: 19 (ref 12–28)
BUN: 14 mg/dL (ref 8–27)
CO2: 23 mmol/L (ref 20–29)
Calcium: 9.8 mg/dL (ref 8.7–10.3)
Chloride: 97 mmol/L (ref 96–106)
Creatinine, Ser: 0.74 mg/dL (ref 0.57–1.00)
Glucose: 95 mg/dL (ref 70–99)
Potassium: 4.8 mmol/L (ref 3.5–5.2)
Sodium: 138 mmol/L (ref 134–144)
eGFR: 87 mL/min/{1.73_m2} (ref >59)

## 2023-03-04 LAB — HEMOGLOBIN A1C
Est. average glucose Bld gHb Est-mCnc: 126 mg/dL
Hgb A1c MFr Bld: 6 % — ABNORMAL HIGH (ref 4.8–5.6)

## 2023-03-07 NOTE — Progress Notes (Signed)
Chief Complaint:   OBESITY Jaley is here to discuss her progress with her obesity treatment plan along with follow-up of her obesity related diagnoses. Kischa is on the Category 2 Plan and states she is following her eating plan approximately 80% of the time. Naporsha states she is doing senior citizen classes for 45 minutes 2 times per week.  Today's visit was #: 40 Starting weight: 204 lbs Starting date: 01/27/2021 Today's weight: 182 lbs Today's date: 02/15/2023 Total lbs lost to date: 22 Total lbs lost since last in-office visit: 2  Interim History: Laureen continues to do well with her weight loss.  She is walking at work and she does group exercises.  She sometimes notes increased hunger and the late mornings, which may be related to days when her breakfast protein is decreased.  Subjective:   1. Essential hypertension Gelisa is being followed by cardiology, who is adjusting her medications.  Her blood pressure at home is generally better than what it is in our office.  2. Other insomnia Merari states she is not sleeping well and this has been going on for quite a while.  She has never been evaluated by a sleep doctor.  Assessment/Plan:   1. Essential hypertension Ima is to continue to work on her diet and exercise to treat her hypertension.  Will continue to follow to watch for signs of hypotension with continued weight loss and medication adjustments.  2. Other insomnia Loreena was offered a referral for a sleep consultation.  She will think on this and let me know if she is interested.  Will continue to monitor.  3. BMI 30.0-30.9,adult  4. Obesity, Beginning BMI 35.02 Bunita is currently in the action stage of change. As such, her goal is to continue with weight loss efforts. She has agreed to the Category 2 Plan.   We will recheck fasting labs at her next visit.   Exercise goals: As is. Chair exercise handout was given.   Behavioral modification  strategies: increasing lean protein intake, especially at breakfast and meal planning and cooking strategies.  Maysa has agreed to follow-up with our clinic in 4 weeks. She was informed of the importance of frequent follow-up visits to maximize her success with intensive lifestyle modifications for her multiple health conditions.   Objective:   Blood pressure (!) 160/83, pulse 81, temperature 97.9 F (36.6 C), height 5\' 5"  (1.651 m), weight 182 lb (82.6 kg), SpO2 95 %. Body mass index is 30.29 kg/m.  Lab Results  Component Value Date   CREATININE 0.74 03/03/2023   BUN 14 03/03/2023   NA 138 03/03/2023   K 4.8 03/03/2023   CL 97 03/03/2023   CO2 23 03/03/2023   Lab Results  Component Value Date   ALT 17 11/03/2022   AST 15 11/03/2022   ALKPHOS 93 11/03/2022   BILITOT 0.5 11/03/2022   Lab Results  Component Value Date   HGBA1C 6.0 (H) 03/03/2023   HGBA1C 6.0 (H) 11/02/2022   HGBA1C 6.0 (H) 07/01/2022   HGBA1C 6.0 (H) 03/01/2022   HGBA1C 6.0 (H) 10/28/2021   Lab Results  Component Value Date   INSULIN 9.7 11/03/2022   INSULIN 31.4 (H) 01/27/2021   Lab Results  Component Value Date   TSH 0.575 11/02/2022   Lab Results  Component Value Date   CHOL 197 03/03/2023   HDL 67 03/03/2023   LDLCALC 115 (H) 03/03/2023   TRIG 84 03/03/2023   CHOLHDL 2.9 03/03/2023   Lab  Results  Component Value Date   VD25OH 52.2 11/03/2022   VD25OH 52.0 02/04/2022   VD25OH 61.9 07/02/2021   Lab Results  Component Value Date   WBC 4.8 11/02/2022   HGB 11.7 11/02/2022   HCT 34.2 11/02/2022   MCV 85 11/02/2022   PLT 446 11/02/2022   No results found for: "IRON", "TIBC", "FERRITIN"  Attestation Statements:   Reviewed by clinician on day of visit: allergies, medications, problem list, medical history, surgical history, family history, social history, and previous encounter notes.  Time spent on visit including pre-visit chart review and post-visit care and charting was 30  minutes.   I, Trixie Dredge, am acting as transcriptionist for Dennard Nip, MD.  I have reviewed the above documentation for accuracy and completeness, and I agree with the above. -  Dennard Nip, MD

## 2023-03-16 ENCOUNTER — Ambulatory Visit (INDEPENDENT_AMBULATORY_CARE_PROVIDER_SITE_OTHER): Payer: Medicare Other | Admitting: Family Medicine

## 2023-03-16 ENCOUNTER — Encounter (INDEPENDENT_AMBULATORY_CARE_PROVIDER_SITE_OTHER): Payer: Self-pay | Admitting: Family Medicine

## 2023-03-16 VITALS — BP 150/79 | HR 84 | Temp 97.8°F | Ht 65.0 in | Wt 182.0 lb

## 2023-03-16 DIAGNOSIS — E7849 Other hyperlipidemia: Secondary | ICD-10-CM

## 2023-03-16 DIAGNOSIS — E119 Type 2 diabetes mellitus without complications: Secondary | ICD-10-CM | POA: Diagnosis not present

## 2023-03-16 DIAGNOSIS — E669 Obesity, unspecified: Secondary | ICD-10-CM | POA: Diagnosis not present

## 2023-03-16 DIAGNOSIS — Z7984 Long term (current) use of oral hypoglycemic drugs: Secondary | ICD-10-CM | POA: Diagnosis not present

## 2023-03-16 DIAGNOSIS — I10 Essential (primary) hypertension: Secondary | ICD-10-CM

## 2023-03-16 DIAGNOSIS — Z6830 Body mass index (BMI) 30.0-30.9, adult: Secondary | ICD-10-CM

## 2023-03-16 MED ORDER — METFORMIN HCL ER 500 MG PO TB24: 500.0000 mg | ORAL_TABLET | Freq: Two times a day (BID) | ORAL | 0 refills | Status: DC

## 2023-03-17 ENCOUNTER — Other Ambulatory Visit: Payer: Self-pay | Admitting: Nurse Practitioner

## 2023-03-21 NOTE — Progress Notes (Unsigned)
Chief Complaint:   OBESITY Deborah [name] is here to discuss her progress with her obesity treatment plan along with follow-up of her obesity related diagnoses. Deborah [name] is on the Category 2 Plan and states she is following her eating plan approximately 80% of the time. 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  Today's visit was #: 30 Starting weight: 204 lbs Starting date: 01/27/2021 Today's weight: 182 lbs Today's date: 03/16/2023 Total lbs lost to date: 22 Total lbs lost since last in-office visit: 0  Interim History: Deborah [name] has done well with maintaining her weight.  She is staying active at work and with exercises.  She is thinking about Easter and trying to make strategies to not go too far off track.  Subjective:   1. Type 2 diabetes mellitus without complication, without long-term current use of insulin (HCC) Deborah [name] continues to do well with her diet.  Her fasting blood sugars mostly range in the 90s.  2. Other hyperlipidemia Deborah [name]'s last LDL has improved with her diet and statin, but it is not yet at goal.  3. Essential hypertension Deborah [name]'s blood pressure remains elevated.  She is following up with Cardiology for this, and she is trying to decrease her sodium.  Assessment/Plan:   1. Type 2 diabetes mellitus without complication, without long-term current use of insulin (HCC) Deborah [name] will continue with her diet and metformin, and we will refill metformin for 90 days.  - metFORMIN (GLUCOPHAGE-XR) 500 MG 24 hr tablet; Take 1 tablet (500 mg total) by mouth 2 (two) times daily.  Dispense: 180 tablet; Refill: 0  2. Other hyperlipidemia Deborah [name] will continue to decrease cholesterol in her diet, and we will continue to follow.  3. Essential hypertension Deborah [name] will continue to follow-up with Cardiology, and she will continue to work on her diet.  4. BMI 30.0-30.9,adult  5. Obesity, Beginning BMI 35.02 Deborah [name] is  currently in the action stage of change. As such, her goal is to continue with weight loss efforts. She has agreed to the Category 2 Plan.   Exercise goals: As is.    Behavioral modification strategies: increasing lean protein intake and holiday eating strategies .  Deborah [name] has agreed to follow-up with our clinic in 4 weeks. She was informed of the importance of frequent follow-up visits to maximize her success with intensive lifestyle modifications for her multiple health conditions.   Objective:   Blood pressure (!) 150/79, pulse 84, temperature 97.8 F (36.6 C), height 5\' 5"  (1.651 m), weight 182 lb (82.6 kg), SpO2 95 %. Body mass index is 30.29 kg/m.  Lab Results  Component Value Date   CREATININE 0.74 03/03/2023   BUN 14 03/03/2023   NA 138 03/03/2023   K 4.8 03/03/2023   CL 97 03/03/2023   CO2 23 03/03/2023   Lab Results  Component Value Date   ALT 17 11/03/2022   AST 15 11/03/2022   ALKPHOS 93 11/03/2022   BILITOT 0.5 11/03/2022   Lab Results  Component Value Date   HGBA1C 6.0 (H) 03/03/2023   HGBA1C 6.0 (H) 11/02/2022   HGBA1C 6.0 (H) 07/01/2022   HGBA1C 6.0 (H) 03/01/2022   HGBA1C 6.0 (H) 10/28/2021   Lab Results  Component Value Date   INSULIN 9.7 11/03/2022   INSULIN 31.4 (H) 01/27/2021   Lab Results  Component Value Date   TSH 0.575 11/02/2022   Lab Results  Component Value Date   CHOL 197 03/03/2023  HDL 67 03/03/2023   LDLCALC 115 (H) 03/03/2023   TRIG 84 03/03/2023   CHOLHDL 2.9 03/03/2023   Lab Results  Component Value Date   VD25OH 52.2 11/03/2022   VD25OH 52.0 02/04/2022   VD25OH 61.9 07/02/2021   Lab Results  Component Value Date   WBC 4.8 11/02/2022   HGB 11.7 11/02/2022   HCT 34.2 11/02/2022   MCV 85 11/02/2022   PLT 446 11/02/2022   No results found for: "IRON", "TIBC", "FERRITIN"  Attestation Statements:   Reviewed by clinician on day of visit: allergies, medications, problem list, medical history, surgical history,  family history, social history, and previous encounter notes.   I, Trixie Dredge, am acting as transcriptionist for Dennard Nip, MD.  I have reviewed the above documentation for accuracy and completeness, and I agree with the above. -  Dennard Nip, MD

## 2023-03-23 ENCOUNTER — Ambulatory Visit
Admission: EM | Admit: 2023-03-23 | Discharge: 2023-03-23 | Disposition: A | Discharge status: Home / Self Care | Payer: Medicare Other | Attending: Emergency Medicine | Admitting: Emergency Medicine

## 2023-03-23 DIAGNOSIS — S60322A Blister (nonthermal) of left thumb, initial encounter: Secondary | ICD-10-CM

## 2023-03-23 MED ORDER — CEPHALEXIN 500 MG PO CAPS: 500.0000 mg | ORAL_CAPSULE | Freq: Two times a day (BID) | ORAL | 0 refills | Status: AC

## 2023-03-23 NOTE — ED Triage Notes (Signed)
Pt presents with painful blister on left thumb X 3 days with no known injury.

## 2023-03-23 NOTE — ED Provider Notes (Signed)
[facility]    CSN: [id number] Arrival date & time: 03/23/23  1029      History   Chief Complaint Chief Complaint  Patient presents with   Blister    HPI Deborah [name] is a 72 y.o. female.   Patient presents for evaluation of a blister to the left thumb beginning 3 days ago.  Started abruptly, unknown cause however patient endorses that she bites fingernails daily as well as she completes fingersticks to check blood sugar but does not feel this is related.  Area has increased in size and become swollen, causing a constant throbbing sensation, rated a 10 out of 10.  Has attempted use of ice pack which has been ineffective.  Denies drainage or fevers.    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   Diabetes mellitus without complication    Hyperlipidemia    Hypertension    Hypothyroidism    Joint pain    Osteoarthritis    Other fatigue    Shortness of breath on exertion    Thyroid condition     Patient Active Problem List   Diagnosis Date Noted   BMI 30.0-30.9,adult 01/20/2023   Obesity, Beginning BMI 35.02 01/20/2023   Essential hypertension 07/05/2022   Type 2 diabetes mellitus without complication, without long-term current use of insulin 07/05/2022   Other hyperlipidemia 03/04/2022   Insomnia 04/30/2019   Seasonal allergies 03/27/2019   Prediabetes 01/11/2019   Hypothyroidism 01/11/2019   Vitamin D deficiency 01/11/2019   Upper airway cough syndrome 01/11/2019   Fatigue 01/11/2019   Type 2 diabetes mellitus 09/16/2018   Weight loss 06/08/2011   Menopause 06/08/2011    Past Surgical History:  Procedure Laterality Date   APPENDECTOMY  1974   CARDIAC CATHETERIZATION     two, dates not provided.   CHOLECYSTECTOMY     confirm date with patient, was it 106?   LAPAROSCOPIC TUBAL LIGATION  1987   THYROIDECTOMY      OB History     Gravida  4   Para  3   Term       Preterm      AB      Living         SAB      IAB      Ectopic      Multiple      Live Births               Home Medications    Prior to Admission medications   Medication Sig Start Date End Date Taking? Authorizing Provider  acetaminophen (TYLENOL) 500 MG tablet Take 1,000 mg by mouth at bedtime.    [provider]  Blood Glucose Monitoring Suppl (ACURA BLOOD GLUCOSE METER) w/Device KIT Check blood sugars twice daily. Please provide patient with meter and supplies that is covered by pt ins. 01/18/19   Minette Brine, FNP  Blood Glucose Monitoring Suppl (ONE TOUCH ULTRA MINI) w/Device KIT Use to check blood sugars 3 times a day. Dx code:e11.65 04/21/20   Minette Brine, FNP  cholecalciferol (VITAMIN D) 1000 units tablet Take 2,000 Units by mouth daily.    [provider]  CO ENZYME Q-10 PO Take by mouth.    [provider]  cyclobenzaprine (FLEXERIL) 10 MG tablet Take 1 tablet (10 mg total) by mouth 2 (two) times daily as needed for muscle spasms. 05/02/21  Pati Gallo S, PA  diclofenac Sodium (VOLTAREN) 1 % GEL Apply 4 g topically 4 (four) times daily. 05/02/21   Tedd Sias, PA  docusate sodium (COLACE) 100 MG capsule Take 100 mg by mouth daily.    [provider]  glucose blood (ONETOUCH ULTRA) test strip USE WITH METER TO CHECK BLOOD SUGAR BEFORE BREAKFAST AND BEFORE DINNER DAILY 02/14/23   Minette Brine, FNP  hydrALAZINE (APRESOLINE) 100 MG tablet TAKE 1 AND 1/2 TABLETS BY MOUTH  TWICE DAILY 03/18/23   Marylu Lund., NP  hydrochlorothiazide (HYDRODIURIL) 25 MG tablet TAKE 1 TABLET BY MOUTH DAILY 03/03/23   Marylu Lund., NP  HYDROcodone bit-homatropine (HYCODAN) 5-1.5 MG/5ML syrup Take 5 mLs by mouth every 6 (six) hours as needed for cough. 03/01/23   Minette Brine, FNP  levothyroxine (SYNTHROID) 88 MCG tablet Take 1 tablet (88 mcg total) by mouth daily before breakfast. 11/03/22   Minette Brine, FNP  MAGNESIUM PO Take 375 mg by  mouth daily.    [provider]  Melatonin 10 MG CAPS Take 10 mg by mouth at bedtime.    [provider]  metFORMIN (GLUCOPHAGE-XR) 500 MG 24 hr tablet Take 1 tablet (500 mg total) by mouth 2 (two) times daily. 03/16/23   Dennard Nip D, MD  pravastatin (PRAVACHOL) 20 MG tablet Take 1 tablet (20 mg total) by mouth every evening. 02/23/23   Minette Brine, FNP  RABEprazole (ACIPHEX) 20 MG tablet Take 30- 60 min before your first and last meals of the day 02/22/23   Tanda Rockers, MD  spironolactone (ALDACTONE) 25 MG tablet Take 1 tablet (25 mg total) by mouth daily. 02/08/23   Marylu Lund., NP  traZODone (DESYREL) 50 MG tablet Take 1/2 tablet by mouth at hs 03/03/23   Minette Brine, FNP  Turmeric 500 MG CAPS Take 1 tablet by mouth as needed (Takes occassionally).    [provider]    Family History Family History  Problem Relation Age of Onset   COPD Mother    Diabetes Mother    Hypertension Mother    Obesity Mother    Heart disease Father    Hypertension Father    Glaucoma Father     Social History Social History   Tobacco Use   Smoking status: Never   Smokeless tobacco: Never  Vaping Use   Vaping Use: Never used  Substance Use Topics   Alcohol use: Not Currently   Drug use: No     Allergies   Amlodipine, Aspartame and phenylalanine, Bidil [isosorb dinitrate-hydralazine], Elavil [amitriptyline], Erythromycin, Fentanyl, Ibuprofen, Lisinopril, Metoprolol, Phenylalanine, Prilosec [omeprazole], Rosuvastatin, Tramadol, Valsartan, Verapamil, and Zantac [ranitidine hcl]   Review of Systems Review of Systems   Physical Exam Triage Vital Signs ED Triage Vitals  Enc Vitals Group     BP 03/23/23 1137 (!) 162/84     Pulse Rate 03/23/23 1137 86     Resp --      Temp 03/23/23 1137 98 F (36.7 C)     Temp Source 03/23/23 1137 Oral     SpO2 03/23/23 1137 97 %     Weight --      Height --      Head Circumference --      Peak Flow --      Pain  Score 03/23/23 1136 7     Pain Loc --      Pain Edu? --      Excl. in [place]? --  No data found.  Updated Vital Signs BP (!) 162/84 (BP Location: Right Arm)   Pulse 86   Temp 98 F (36.7 C) (Oral)   SpO2 97%   Visual Acuity Right Eye Distance:   Left Eye Distance:   Bilateral Distance:    Right Eye Near:   Left Eye Near:    Bilateral Near:     Physical Exam Constitutional:      Appearance: Normal appearance.  Eyes:     Extraocular Movements: Extraocular movements intact.  Skin:    Comments: Erythema and  moderate swelling present over the medial palmar aspect of the proximal phalanx of the left thumb, blood noted at to be underneath the skin, sensation intact, capillary refill less than 3, has full range of motion of the finger, 2+ radial pulse  Neurological:     Mental Status: She is alert and oriented to person, place, and time.      UC Treatments / Results  Labs (all labs ordered are listed, but only abnormal results are displayed) Labs Reviewed - No data to display  EKG   Radiology No results found.  Procedures Incision and Drainage  Date/Time: 03/23/2023 12:15 PM  Performed by: Hans Eden, NP Authorized by: Hans Eden, NP   Consent:    Consent obtained:  Verbal   Consent given by:  Patient   Risks, benefits, and alternatives were discussed: yes   Universal protocol:    Procedure explained and questions answered to patient or proxy's satisfaction: yes     Patient identity confirmed:  Verbally with patient Location:    Type:  Cyst   Size:  1x2   Location: Left thumb. Pre-procedure details:    Skin preparation:  Chlorhexidine with alcohol Anesthesia:    Anesthesia method:  Topical application   Topical anesthesia: lidocaine mist. Procedure type:    Complexity:  Simple Procedure details:    Incision types:  Stab incision   Drainage:  Bloody and purulent   Drainage amount:  Moderate   Wound treatment:  Wound left open   Packing  materials:  None Post-procedure details:    Procedure completion:  Tolerated  (including critical care time)  Medications Ordered in UC Medications - No data to display  Initial Impression / Assessment and Plan / UC Course  I have reviewed the triage vital signs and the nursing notes.  Pertinent labs & imaging results that were available during my care of the patient were reviewed by me and considered in my medical decision making (see chart for details).  Blister of left thumb, initial encounter  Opening to the skin most likely related to fingerstick and nailbiting, discussed with patient, incision and drainage completed, blood and purulent drainage noted, nonadherent dressing placed, prescribed cephalexin to prevent infection as patient is diabetic, recommended continued ice and heat over the affected area, may use over-the-counter analgesics for management of pain, declined Toradol injection in office, advised follow-up with primary doctor in 1 week for reevaluation Final Clinical Impressions(s) / UC Diagnoses   Final diagnoses:  None   Discharge Instructions   None    ED Prescriptions   None    PDMP not reviewed this encounter.   Hans Eden, NP 03/23/23 1216

## 2023-03-23 NOTE — Discharge Instructions (Signed)
Area has been drained here in office  Cleanse area with soap and water at least once daily, may also cleanse during normal hygiene, cover with a Band-Aid to keep area from becoming dirty, may apply topical antibiotic cream if you would like, you will be on oral medicine  You have been placed on antibiotic to keep area from becoming infected, take cephalexin every morning and every evening for 5 days  May take Tylenol every 6 hours as needed for management of pain  May use ice or heat over the affected area in 10 to 15-minute intervals  Please schedule follow-up appointment with your primary doctor in 1 week for reevaluation of symptoms

## 2023-04-09 ENCOUNTER — Other Ambulatory Visit: Payer: Self-pay | Admitting: Nurse Practitioner

## 2023-04-09 DIAGNOSIS — E039 Hypothyroidism, unspecified: Secondary | ICD-10-CM

## 2023-04-13 ENCOUNTER — Ambulatory Visit (INDEPENDENT_AMBULATORY_CARE_PROVIDER_SITE_OTHER): Payer: Medicare Other | Admitting: Family Medicine

## 2023-05-02 ENCOUNTER — Encounter: Payer: Self-pay | Admitting: Nurse Practitioner

## 2023-05-05 ENCOUNTER — Encounter: Payer: Self-pay | Admitting: Nurse Practitioner

## 2023-05-09 ENCOUNTER — Other Ambulatory Visit: Payer: Self-pay | Admitting: Nurse Practitioner

## 2023-05-09 DIAGNOSIS — R058 Other specified cough: Secondary | ICD-10-CM

## 2023-05-09 MED ORDER — HYDROCODONE BIT-HOMATROP MBR 5-1.5 MG/5ML PO SOLN: 5.0000 mL | Freq: Four times a day (QID) | ORAL | 0 refills | Status: DC | PRN

## 2023-05-11 ENCOUNTER — Ambulatory Visit (INDEPENDENT_AMBULATORY_CARE_PROVIDER_SITE_OTHER): Payer: Medicare Other | Admitting: Family Medicine

## 2023-05-11 ENCOUNTER — Other Ambulatory Visit: Payer: Self-pay | Admitting: Nurse Practitioner

## 2023-05-11 ENCOUNTER — Encounter (INDEPENDENT_AMBULATORY_CARE_PROVIDER_SITE_OTHER): Payer: Self-pay | Admitting: Family Medicine

## 2023-05-11 VITALS — BP 131/81 | HR 88 | Temp 98.2°F | Ht 65.0 in | Wt 182.0 lb

## 2023-05-11 DIAGNOSIS — E7849 Other hyperlipidemia: Secondary | ICD-10-CM

## 2023-05-11 DIAGNOSIS — E119 Type 2 diabetes mellitus without complications: Secondary | ICD-10-CM

## 2023-05-11 DIAGNOSIS — I10 Essential (primary) hypertension: Secondary | ICD-10-CM

## 2023-05-11 DIAGNOSIS — Z7984 Long term (current) use of oral hypoglycemic drugs: Secondary | ICD-10-CM

## 2023-05-11 DIAGNOSIS — Z6830 Body mass index (BMI) 30.0-30.9, adult: Secondary | ICD-10-CM

## 2023-05-11 DIAGNOSIS — E669 Obesity, unspecified: Secondary | ICD-10-CM

## 2023-05-11 MED ORDER — METFORMIN HCL ER 500 MG PO TB24: 500.0000 mg | ORAL_TABLET | Freq: Two times a day (BID) | ORAL | 0 refills | Status: DC

## 2023-05-11 NOTE — Progress Notes (Signed)
Chief Complaint:   OBESITY Deborah [name] is here to discuss her progress with her obesity treatment plan along with follow-up of her obesity related diagnoses. Deborah [name] is on the Category 2 Plan and states she is following her eating plan approximately 80% of the time. 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  Today's visit was #: 31 Starting weight: 204 lbs Starting date: 01/27/2021 Today's weight: 182 lbs Today's date: 05/11/2023 Total lbs lost to date: 22 Total lbs lost since last in-office visit: 0  Interim History: Deborah [name] has done well with maintaining her weight loss. Her husband has been sick and her stress has been high. She continues to work on her Category 2 plan and her hunger has been controlled.   Subjective:   1. Other hyperlipidemia Deborah [name] is on Pravachol and she notes some myalgias. She had been on CoQ10 in the past, but it was too expensive to continue.   2. Type 2 diabetes mellitus without complication, without long-term current use of insulin (HCC) Deborah [name] is on metformin and she is working on her diet. She denies nausea or vomiting, and she requests a refill.   Assessment/Plan:   1. Other hyperlipidemia Deborah [name] was encouraged to discuss this with her PCP. In the meanwhile, daily a glass of low calorie electrolytes before bed may help.   2. Type 2 diabetes mellitus without complication, without long-term current use of insulin (HCC) Deborah [name] will continue metformin, and we will refill for 90 days.   - metFORMIN (GLUCOPHAGE-XR) 500 MG 24 hr tablet; Take 1 tablet (500 mg total) by mouth 2 (two) times daily.  Dispense: 180 tablet; Refill: 0  3. BMI 30.0-30.9,adult  4. Obesity, Beginning BMI 35.02 Deborah [name] is currently in the action stage of change. As such, her goal is to continue with weight loss efforts. She has agreed to the Category 2 Plan.   Exercise goals: As is.   Behavioral modification strategies: increasing lean  protein intake.  Deborah [name] has agreed to follow-up with our clinic in 4 weeks. She was informed of the importance of frequent follow-up visits to maximize her success with intensive lifestyle modifications for her multiple health conditions.   Objective:   Blood pressure 131/81, pulse 88, temperature 98.2 F (36.8 C), height 5\' 5"  (1.651 m), weight 182 lb (82.6 kg), SpO2 99 %. Body mass index is 30.29 kg/m.  Lab Results  Component Value Date   CREATININE 0.74 03/03/2023   BUN 14 03/03/2023   NA 138 03/03/2023   K 4.8 03/03/2023   CL 97 03/03/2023   CO2 23 03/03/2023   Lab Results  Component Value Date   ALT 17 11/03/2022   AST 15 11/03/2022   ALKPHOS 93 11/03/2022   BILITOT 0.5 11/03/2022   Lab Results  Component Value Date   HGBA1C 6.0 (H) 03/03/2023   HGBA1C 6.0 (H) 11/02/2022   HGBA1C 6.0 (H) 07/01/2022   HGBA1C 6.0 (H) 03/01/2022   HGBA1C 6.0 (H) 10/28/2021   Lab Results  Component Value Date   INSULIN 9.7 11/03/2022   INSULIN 31.4 (H) 01/27/2021   Lab Results  Component Value Date   TSH 0.575 11/02/2022   Lab Results  Component Value Date   CHOL 197 03/03/2023   HDL 67 03/03/2023   LDLCALC 115 (H) 03/03/2023   TRIG 84 03/03/2023   CHOLHDL 2.9 03/03/2023   Lab Results  Component Value Date   VD25OH 52.2 11/03/2022   VD25OH 52.0 02/04/2022  VD25OH 61.9 07/02/2021   Lab Results  Component Value Date   WBC 4.8 11/02/2022   HGB 11.7 11/02/2022   HCT 34.2 11/02/2022   MCV 85 11/02/2022   PLT 446 11/02/2022   No results found for: "IRON", "TIBC", "FERRITIN"  Attestation Statements:   Reviewed by clinician on day of visit: allergies, medications, problem list, medical history, surgical history, family history, social history, and previous encounter notes.   I, Burt Knack, am acting as transcriptionist for Quillian Quince, MD.  I have reviewed the above documentation for accuracy and completeness, and I agree with the above. -  Quillian Quince,  MD

## 2023-05-24 ENCOUNTER — Other Ambulatory Visit: Payer: Self-pay | Admitting: Nurse Practitioner

## 2023-05-24 DIAGNOSIS — G4700 Insomnia, unspecified: Secondary | ICD-10-CM

## 2023-06-07 ENCOUNTER — Encounter (INDEPENDENT_AMBULATORY_CARE_PROVIDER_SITE_OTHER): Payer: Self-pay | Admitting: Family Medicine

## 2023-06-07 ENCOUNTER — Ambulatory Visit (INDEPENDENT_AMBULATORY_CARE_PROVIDER_SITE_OTHER): Payer: Medicare Other | Admitting: Family Medicine

## 2023-06-07 VITALS — BP 137/81 | HR 69 | Temp 98.0°F | Ht 65.0 in | Wt 183.0 lb

## 2023-06-07 DIAGNOSIS — G4709 Other insomnia: Secondary | ICD-10-CM | POA: Diagnosis not present

## 2023-06-07 DIAGNOSIS — E119 Type 2 diabetes mellitus without complications: Secondary | ICD-10-CM | POA: Diagnosis not present

## 2023-06-07 DIAGNOSIS — Z6830 Body mass index (BMI) 30.0-30.9, adult: Secondary | ICD-10-CM | POA: Diagnosis not present

## 2023-06-07 DIAGNOSIS — Z7984 Long term (current) use of oral hypoglycemic drugs: Secondary | ICD-10-CM

## 2023-06-07 DIAGNOSIS — E669 Obesity, unspecified: Secondary | ICD-10-CM

## 2023-06-07 MED ORDER — METFORMIN HCL ER 500 MG PO TB24: 500.0000 mg | ORAL_TABLET | Freq: Two times a day (BID) | ORAL | 0 refills | Status: DC

## 2023-06-07 NOTE — Progress Notes (Unsigned)
Chief Complaint:   OBESITY Deborah [name] is here to discuss her progress with her obesity treatment plan along with follow-up of her obesity related diagnoses. Deborah [name] is on the Category 2 Plan and states she is following her eating plan approximately 50% of the time. 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  Today's visit was #: 32 Starting weight: 204 lbs Starting date: 01/27/2021 Today's weight: 183 lbs Today's date: 06/07/2023 Total lbs lost to date: 21 Total lbs lost since last in-office visit: 0  Interim History: Patient has been eating out more but she has been mindful of her portions.  She has done less exercise recently but she is still active and on her feet at work.  Subjective:   1. Type 2 diabetes mellitus without complication, without long-term current use of insulin (HCC) Patient has been out of metformin due to pharmacy error.  She requests another refill.  Her last A1c was at 6.0 with normal renal function.  2. Other insomnia Patient is still not sleeping well.  She has tried trazodone but she wakes up with numb hands and feet, and her symptoms resolved when she stopped.  She averages approximately 4 hours of sleep total per night.  Assessment/Plan:   1. Type 2 diabetes mellitus without complication, without long-term current use of insulin (HCC) Patient will continue with her diet and exercise, and will continue metformin and we will refill for 90 days.  - metFORMIN (GLUCOPHAGE-XR) 500 MG 24 hr tablet; Take 1 tablet (500 mg total) by mouth 2 (two) times daily.  Dispense: 180 tablet; Refill: 0  2. Other insomnia We will refer the patient to Dr. Vickey Huger for a consultation.  3. BMI 30.0-30.9,adult  4. Obesity, Beginning BMI 35.02 Deborah [name] is currently in the action stage of change. As such, her goal is to continue with weight loss efforts. She has agreed to the Category 2 Plan.   Exercise goals: Increase strengthening  exercises.  Behavioral modification strategies: increasing lean protein intake and increasing vegetables.  Deborah [name] has agreed to follow-up with our clinic in 4 weeks. She was informed of the importance of frequent follow-up visits to maximize her success with intensive lifestyle modifications for her multiple health conditions.   Objective:   Blood pressure 137/81, pulse 69, temperature 98 F (36.7 C), height 5\' 5"  (1.651 m), weight 183 lb (83 kg), SpO2 96 %. Body mass index is 30.45 kg/m.  Lab Results  Component Value Date   CREATININE 0.74 03/03/2023   BUN 14 03/03/2023   NA 138 03/03/2023   K 4.8 03/03/2023   CL 97 03/03/2023   CO2 23 03/03/2023   Lab Results  Component Value Date   ALT 17 11/03/2022   AST 15 11/03/2022   ALKPHOS 93 11/03/2022   BILITOT 0.5 11/03/2022   Lab Results  Component Value Date   HGBA1C 6.0 (H) 03/03/2023   HGBA1C 6.0 (H) 11/02/2022   HGBA1C 6.0 (H) 07/01/2022   HGBA1C 6.0 (H) 03/01/2022   HGBA1C 6.0 (H) 10/28/2021   Lab Results  Component Value Date   INSULIN 9.7 11/03/2022   INSULIN 31.4 (H) 01/27/2021   Lab Results  Component Value Date   TSH 0.575 11/02/2022   Lab Results  Component Value Date   CHOL 197 03/03/2023   HDL 67 03/03/2023   LDLCALC 115 (H) 03/03/2023   TRIG 84 03/03/2023   CHOLHDL 2.9 03/03/2023   Lab Results  Component Value Date  VD25OH 52.2 11/03/2022   VD25OH 52.0 02/04/2022   VD25OH 61.9 07/02/2021   Lab Results  Component Value Date   WBC 4.8 11/02/2022   HGB 11.7 11/02/2022   HCT 34.2 11/02/2022   MCV 85 11/02/2022   PLT 446 11/02/2022   No results found for: "IRON", "TIBC", "FERRITIN"  Attestation Statements:   Reviewed by clinician on day of visit: allergies, medications, problem list, medical history, surgical history, family history, social history, and previous encounter notes.   I, Burt Knack, am acting as transcriptionist for Quillian Quince, MD.  I have reviewed the above  documentation for accuracy and completeness, and I agree with the above. -  Quillian Quince, MD

## 2023-06-08 ENCOUNTER — Ambulatory Visit (INDEPENDENT_AMBULATORY_CARE_PROVIDER_SITE_OTHER): Payer: Medicare Other | Admitting: Family Medicine

## 2023-06-14 ENCOUNTER — Ambulatory Visit (INDEPENDENT_AMBULATORY_CARE_PROVIDER_SITE_OTHER): Payer: Medicare Other | Admitting: Nurse Practitioner

## 2023-06-14 ENCOUNTER — Encounter: Payer: Self-pay | Admitting: Nurse Practitioner

## 2023-06-14 VITALS — BP 140/70 | HR 78 | Temp 99.4°F | Ht 65.0 in | Wt 184.6 lb

## 2023-06-14 DIAGNOSIS — Z6830 Body mass index (BMI) 30.0-30.9, adult: Secondary | ICD-10-CM

## 2023-06-14 DIAGNOSIS — Z2821 Immunization not carried out because of patient refusal: Secondary | ICD-10-CM | POA: Diagnosis not present

## 2023-06-14 DIAGNOSIS — E119 Type 2 diabetes mellitus without complications: Secondary | ICD-10-CM | POA: Diagnosis not present

## 2023-06-14 DIAGNOSIS — I10 Essential (primary) hypertension: Secondary | ICD-10-CM | POA: Diagnosis not present

## 2023-06-14 DIAGNOSIS — E782 Mixed hyperlipidemia: Secondary | ICD-10-CM | POA: Diagnosis not present

## 2023-06-14 DIAGNOSIS — G4709 Other insomnia: Secondary | ICD-10-CM

## 2023-06-14 NOTE — Assessment & Plan Note (Signed)
Congratulated on her continued weight loss and continue f/u with [facility]

## 2023-06-14 NOTE — Progress Notes (Signed)
Madelaine Bhat, CMA,acting as a Neurosurgeon for [name], FNP.,have documented all relevant documentation on the behalf of [name], FNP,as directed by  [name], FNP while in the presence of [name], FNP.  Subjective:  Patient ID: Deborah [name] , female    DOB: [birth year] , 72 y.o.   MRN: [id number]  Chief Complaint  Patient presents with   Hypertension   Diabetes    HPI  Patient presents today for BP, DM, and chol check. Patient reports compliance with medications and has no other concerns today. Patient denies any chest pains, SOB, or headaches. She was out of metformin for a brief time.   She drank caffeinated coffee prior to her appt.   Diabetes She presents for her follow-up diabetic visit. Diabetes type: prediabetes. Her disease course has been stable. There are no hypoglycemic associated symptoms. Pertinent negatives for hypoglycemia include no dizziness or headaches. There are no diabetic associated symptoms. Pertinent negatives for diabetes include no chest pain, no fatigue, no polydipsia, no polyphagia and no polyuria. There are no hypoglycemic complications. Symptoms are stable. There are no diabetic complications. Risk factors for coronary artery disease include obesity and sedentary lifestyle (prediabetes). Current diabetic treatment includes oral agent (monotherapy). She is compliant with treatment most of the time. Her weight is decreasing steadily. She is following a generally healthy (Continues with [facility], last visit on 06/07/2023) diet. When asked about meal planning, she reported none. She has had a previous visit with a dietitian. She rarely participates in exercise. There is no change in her home blood glucose trend. An ACE inhibitor/angiotensin II receptor blocker is being taken. She does not see a podiatrist.Eye exam is current (04/28/2022 - encouraged to make appt).     Past Medical History:  Diagnosis Date   Back pain    Death  of child 22   Due to cord strangulation.   Diabetes mellitus without complication (HCC)    Hyperlipidemia    Hypertension    Hypothyroidism    Joint pain    Osteoarthritis    Other fatigue    Shortness of breath on exertion    Thyroid condition      Family History  Problem Relation Age of Onset   COPD Mother    Diabetes Mother    Hypertension Mother    Obesity Mother    Heart disease Father    Hypertension Father    Glaucoma Father      Current Outpatient Medications:    acetaminophen (TYLENOL) 500 MG tablet, Take 1,000 mg by mouth at bedtime., Disp: , Rfl:    Blood Glucose Monitoring Suppl (ACURA BLOOD GLUCOSE METER) w/Device KIT, Check blood sugars twice daily. Please provide patient with meter and supplies that is covered by pt ins., Disp: 1 kit, Rfl: 0   Blood Glucose Monitoring Suppl (ONE TOUCH ULTRA MINI) w/Device KIT, Use to check blood sugars 3 times a day. Dx code:e11.65, Disp: 1 kit, Rfl: 0   cholecalciferol (VITAMIN D) 1000 units tablet, Take 2,000 Units by mouth daily., Disp: , Rfl:    cyclobenzaprine (FLEXERIL) 10 MG tablet, Take 1 tablet (10 mg total) by mouth 2 (two) times daily as needed for muscle spasms., Disp: 20 tablet, Rfl: 0   diclofenac Sodium (VOLTAREN) 1 % GEL, Apply 4 g topically 4 (four) times daily., Disp: 100 g, Rfl: 0   docusate sodium (COLACE) 100 MG capsule, Take 100 mg by mouth daily., Disp: , Rfl:    glucose  blood (ONETOUCH ULTRA) test strip, USE WITH METER TO CHECK BLOOD SUGAR BEFORE BREAKFAST AND BEFORE DINNER DAILY, Disp: 200 strip, Rfl: 2   hydrALAZINE (APRESOLINE) 100 MG tablet, TAKE 1 AND 1/2 TABLETS BY MOUTH  TWICE DAILY, Disp: 270 tablet, Rfl: 3   hydrochlorothiazide (HYDRODIURIL) 25 MG tablet, TAKE 1 TABLET BY MOUTH DAILY, Disp: 90 tablet, Rfl: 2   HYDROcodone bit-homatropine (HYCODAN) 5-1.5 MG/5ML syrup, Take 5 mLs by mouth every 6 (six) hours as needed for cough., Disp: 240 mL, Rfl: 0   levothyroxine (SYNTHROID) 88 MCG tablet, TAKE 1  TABLET BY MOUTH DAILY  BEFORE BREAKFAST, Disp: 100 tablet, Rfl: 2   MAGNESIUM PO, Take 375 mg by mouth daily., Disp: , Rfl:    Melatonin 10 MG CAPS, Take 10 mg by mouth at bedtime., Disp: , Rfl:    metFORMIN (GLUCOPHAGE-XR) 500 MG 24 hr tablet, Take 1 tablet (500 mg total) by mouth 2 (two) times daily., Disp: 180 tablet, Rfl: 0   pravastatin (PRAVACHOL) 20 MG tablet, Take 1 tablet (20 mg total) by mouth every evening., Disp: 100 tablet, Rfl: 2   spironolactone (ALDACTONE) 25 MG tablet, Take 1 tablet (25 mg total) by mouth daily., Disp: 90 tablet, Rfl: 1   Turmeric 500 MG CAPS, Take 1 tablet by mouth as needed (Takes occassionally)., Disp: , Rfl:    traZODone (DESYREL) 50 MG tablet, TAKE 1/2 TABLET BY MOUTH AT BEDTIME (Patient not taking: Reported on 06/14/2023), Disp: 45 tablet, Rfl: 1   Allergies  Allergen Reactions   Amlodipine     headache   Aspartame And Phenylalanine Other (See Comments)    Causes migraines   Bidil [Isosorb Dinitrate-Hydralazine]     headache   Elavil [Amitriptyline] Other (See Comments)    Loss of balance, hallucinations   Erythromycin     Ask patient to specify type/severity/reaction. Not indicated on medical history form dated 11/04/10.   Fentanyl     Causes panic attacks   Ibuprofen     Ask patient to specify type/severity/reaction. Not noted on medical history form dated 11/04/10.   Lisinopril Cough   Metoprolol Other (See Comments)   Phenylalanine Other (See Comments)    Causes migraine   Prilosec [Omeprazole] Nausea And Vomiting   Rosuvastatin Other (See Comments)   Tramadol Nausea And Vomiting   Valsartan Cough   Verapamil Nausea And Vomiting   Zantac [Ranitidine Hcl]      Review of Systems  Constitutional: Negative.  Negative for fatigue.  HENT: Negative.    Eyes: Negative.   Respiratory: Negative.    Cardiovascular: Negative.  Negative for chest pain, palpitations and leg swelling.  Gastrointestinal: Negative.   Endocrine: Negative.  Negative  for polydipsia, polyphagia and polyuria.  Genitourinary: Negative.   Musculoskeletal: Negative.   Skin: Negative.   Allergic/Immunologic: Negative.   Neurological: Negative.  Negative for dizziness and headaches.  Hematological: Negative.   Psychiatric/Behavioral: Negative.       Today's Vitals   06/14/23 0843  BP: (!) 150/70  Pulse: 78  Temp: 99.4 F (37.4 C)  TempSrc: Oral  Weight: 184 lb 9.6 oz (83.7 kg)  Height: 5\' 5"  (1.651 m)  PainSc: 6   PainLoc: Knee   Body mass index is 30.72 kg/m.  Wt Readings from Last 3 Encounters:  06/14/23 184 lb 9.6 oz (83.7 kg)  06/07/23 183 lb (83 kg)  05/11/23 182 lb (82.6 kg)    The 10-year ASCVD risk score (Arnett DK, et al., 2019) is: 35.7%  Values used to calculate the score:     Age: 71 years     Sex: Female     Is Non-Hispanic African American: Yes     Diabetic: Yes     Tobacco smoker: No     Systolic Blood Pressure: 150 mmHg     Is BP treated: Yes     HDL Cholesterol: 67 mg/dL     Total Cholesterol: 197 mg/dL  Objective:  Physical Exam Vitals reviewed.  Constitutional:      General: She is not in acute distress.    Appearance: Normal appearance.  Cardiovascular:     Rate and Rhythm: Normal rate and regular rhythm.     Pulses: Normal pulses.     Heart sounds: Normal heart sounds. No murmur heard. Pulmonary:     Effort: Pulmonary effort is normal. No respiratory distress.     Breath sounds: Normal breath sounds. No wheezing.  Skin:    Capillary Refill: Capillary refill takes less than 2 seconds.  Neurological:     General: No focal deficit present.     Mental Status: She is alert and oriented to person, place, and time.     Cranial Nerves: No cranial nerve deficit.     Motor: No weakness.  Psychiatric:        Mood and Affect: Mood normal.        Behavior: Behavior normal.        Thought Content: Thought content normal.        Judgment: Judgment normal.         Assessment And Plan:  Type 2 diabetes mellitus  without complication, without long-term current use of insulin (HCC) Assessment & Plan: Stable, will check HgbA1c. She is encouraged to schedule appt with ophthalmologist. If unable to get in with them she can call to office to schedule for her diabetic eye exam in Sept  Orders: -     Hemoglobin A1c  Essential hypertension Assessment & Plan: Blood pressure is improved with repeat, continue to limit intake of caffeinated drinks  Orders: -     Basic metabolic panel  Mixed hyperlipidemia Assessment & Plan: Stable, continue statin  Orders: -     Lipid panel  COVID-19 vaccination declined  BMI 30.0-30.9,adult Assessment & Plan: Congratulated on her continued weight loss and continue f/u with [facility]   Other insomnia Assessment & Plan: She is no longer taking trazodone due to side effects.     Return for controlled DM check 4 months.   Patient was given opportunity to ask questions. Patient verbalized understanding of the plan and was able to repeat key elements of the plan. All questions were answered to their satisfaction.  [name], FNP  I, [name], FNP, have reviewed all documentation for this visit. The documentation on 06/14/23 for the exam, diagnosis, procedures, and orders are all accurate and complete.   IF YOU HAVE BEEN REFERRED TO A SPECIALIST, IT MAY TAKE 1-2 WEEKS TO SCHEDULE/PROCESS THE REFERRAL. IF YOU HAVE NOT HEARD FROM US/SPECIALIST IN TWO WEEKS, PLEASE GIVE [place] A CALL AT [phone].

## 2023-06-14 NOTE — Assessment & Plan Note (Signed)
Blood pressure is improved with repeat, continue to limit intake of caffeinated drinks

## 2023-06-14 NOTE — Assessment & Plan Note (Signed)
Stable, will check HgbA1c. She is encouraged to schedule appt with ophthalmologist. If unable to get in with them she can call to office to schedule for her diabetic eye exam in Sept

## 2023-06-14 NOTE — Assessment & Plan Note (Signed)
Stable, continue statin 

## 2023-06-14 NOTE — Assessment & Plan Note (Signed)
She is no longer taking trazodone due to side effects.

## 2023-06-15 LAB — LIPID PANEL
Chol/HDL Ratio: 3 ratio (ref 0.0–4.4)
Cholesterol, Total: 186 mg/dL (ref 100–199)
HDL: 61 mg/dL (ref >39)
LDL Chol Calc (NIH): 108 mg/dL — ABNORMAL HIGH (ref 0–99)
Triglycerides: 94 mg/dL (ref 0–149)
VLDL Cholesterol Cal: 17 mg/dL (ref 5–40)

## 2023-06-15 LAB — BASIC METABOLIC PANEL
BUN/Creatinine Ratio: 21 (ref 12–28)
BUN: 16 mg/dL (ref 8–27)
CO2: 23 mmol/L (ref 20–29)
Calcium: 9.8 mg/dL (ref 8.7–10.3)
Chloride: 96 mmol/L (ref 96–106)
Creatinine, Ser: 0.76 mg/dL (ref 0.57–1.00)
Glucose: 90 mg/dL (ref 70–99)
Potassium: 4.6 mmol/L (ref 3.5–5.2)
Sodium: 134 mmol/L (ref 134–144)
eGFR: 84 mL/min/{1.73_m2} (ref >59)

## 2023-06-15 LAB — HEMOGLOBIN A1C
Est. average glucose Bld gHb Est-mCnc: 131 mg/dL
Hgb A1c MFr Bld: 6.2 % — ABNORMAL HIGH (ref 4.8–5.6)

## 2023-07-01 ENCOUNTER — Ambulatory Visit (HOSPITAL_BASED_OUTPATIENT_CLINIC_OR_DEPARTMENT_OTHER): Payer: Medicare Other | Admitting: Family

## 2023-07-01 ENCOUNTER — Encounter (HOSPITAL_BASED_OUTPATIENT_CLINIC_OR_DEPARTMENT_OTHER): Payer: Self-pay | Admitting: Family

## 2023-07-01 VITALS — BP 132/86 | HR 74 | Ht 65.0 in | Wt 187.5 lb

## 2023-07-01 DIAGNOSIS — R072 Precordial pain: Secondary | ICD-10-CM | POA: Diagnosis not present

## 2023-07-01 DIAGNOSIS — I10 Essential (primary) hypertension: Secondary | ICD-10-CM | POA: Diagnosis not present

## 2023-07-01 DIAGNOSIS — E1169 Type 2 diabetes mellitus with other specified complication: Secondary | ICD-10-CM

## 2023-07-01 DIAGNOSIS — E782 Mixed hyperlipidemia: Secondary | ICD-10-CM

## 2023-07-01 DIAGNOSIS — Z7984 Long term (current) use of oral hypoglycemic drugs: Secondary | ICD-10-CM

## 2023-07-01 MED ORDER — METOPROLOL TARTRATE 100 MG PO TABS
ORAL_TABLET | ORAL | 0 refills | Status: DC
Start: 2023-07-01 — End: 2023-08-12

## 2023-07-01 NOTE — Patient Instructions (Signed)
Medication Instructions:  Your physician recommends that you continue on your current medications as directed. Please refer to the Current Medication list given to you today.  *If you need a refill on your cardiac medications before your next appointment, please call your pharmacy*   Lab Work: Your physician recommends that you return for lab work for [facility] if CT is scheduled after 7/25.  If you have labs (blood work) drawn today and your tests are completely normal, you will receive your results only by: MyChart Message (if you have MyChart) OR A paper copy in the mail If you have any lab test that is abnormal or we need to change your treatment, we will call you to review the results.   Testing/Procedures: Your physician has requested that you have an echocardiogram. Echocardiography is a painless test that uses sound waves to create images of your heart. It provides your doctor with information about the size and shape of your heart and how well your heart's chambers and valves are working. This procedure takes approximately one hour. There are no restrictions for this procedure. Please do NOT wear cologne, perfume, aftershave, or lotions (deodorant is allowed). Please arrive 15 minutes prior to your appointment time.    Your cardiac CT will be scheduled at one of the below locations:   [facility] [address] [place], [place] [address] [phone]  If scheduled at [facility], please arrive at the [facility] Entrance (Entrance C2) of [facility] 30 minutes prior to test start time. You can use the FREE valet parking offered at entrance C (encouraged to control the heart rate for the test)  Proceed to the [facility] Radiology Department (first floor) to check-in and test prep.  All radiology patients and guests should use entrance C2 at [facility], accessed from [facility], even though the hospital's physical address  listed is [address].     Please follow these instructions carefully (unless otherwise directed):  An IV will be required for this test and Nitroglycerin will be given.   On the Night Before the Test: Be sure to Drink plenty of water. Do not consume any caffeinated/decaffeinated beverages or chocolate 12 hours prior to your test. Do not take any antihistamines 12 hours prior to your test.   On the Day of the Test: Drink plenty of water until 1 hour prior to the test. Do not eat any food 1 hour prior to test. You may take your regular medications prior to the test.  Take metoprolol (Lopressor)100mg   two hours prior to test. If you take Furosemide/Hydrochlorothiazide/Spironolactone, please HOLD on the morning of the test. FEMALES- please wear underwire-free bra if available, avoid dresses & tight clothing      After the Test: Drink plenty of water. After receiving IV contrast, you may experience a mild flushed feeling. This is normal. On occasion, you may experience a mild rash up to 24 hours after the test. This is not dangerous. If this occurs, you can take Benadryl 25 mg and increase your fluid intake. If you experience trouble breathing, this can be serious. If it is severe call 911 IMMEDIATELY. If it is mild, please call our office. If you take any of these medications: Glipizide/Metformin, Avandament, Glucavance, please do not take 48 hours after completing test unless otherwise instructed.  We will call to schedule your test 2-4 weeks out understanding that some insurance companies will need an authorization prior to the  service being performed.   For more information and frequently asked questions, please visit our website : [email or web address]  For non-scheduling related questions, please contact the cardiac imaging nurse navigator should you have any questions/concerns: Rockwell Alexandria, Cardiac Imaging Nurse Navigator Larey Brick, Cardiac  Imaging Nurse Navigator [facility] Direct Office Dial: [phone]   For scheduling needs, including cancellations and rescheduling, please call [place], [phone].  Follow-Up: At [facility], you and your health needs are our priority.  As part of our continuing mission to provide you with exceptional heart care, we have created designated Provider Care Teams.  These Care Teams include your primary Cardiologist (physician) and Advanced Practice Providers (APPs -  Physician Assistants and Nurse Practitioners) who all work together to provide you with the care you need, when you need it.  We recommend signing up for the patient portal called "MyChart".  Sign up information is provided on this After Visit Summary.  MyChart is used to connect with patients for Virtual Visits (Telemedicine).  Patients are able to view lab/test results, encounter notes, upcoming appointments, etc.  Non-urgent messages can be sent to your provider as well.   To learn more about what you can do with MyChart, go to [email or web address].    Your next appointment:   6 weeks with either Dr. Duke Salvia or Gillian Shields, NP

## 2023-07-01 NOTE — Progress Notes (Signed)
Cardiology Office Note:  .   Date:  07/01/2023  ID:  Eugenia Pancoast, DOB [birth year], MRN [id number] PCP: [name], FNP  [facility] Providers Cardiologist:  None    History of Present Illness: .   Deborah [name] is a 72 y.o. female with a history of hypertension, hypothyroidism, DM2, aortic atherosclerosis, coronary calcification seen on CT, hyperlipidemia.  Prior cardiac catheterization in 1994 and 2002 were unremarkable.  CT 2016 with mild coronary calcifications.  Established Dr. Duke Salvia 2021 for elevated BP.  She reported intolerance to many BP medications and was eventually maintained on HCTZ 25 mg daily, hydralazine 100 mg twice daily, spironolactone 25 mg daily.  Hydralazine was later increased to 100 mg 3 times daily.  She was seen 12/27/2022 by Robin Searing, NP to reestablish with cardiology.  Home BP 110/72 to 159/88.  She had been off spironolactone for the past 3 weeks.  She was active participating Silver sneakers 2-3 times per week.  She was recommended to continue home monitoring and continue HCTZ 25 mg.  01/10/2023 as BP persistently elevated she was recommended to increase hydralazine to 100 mg twice daily.  Due to persistent elevated BPs on 02/08/2023 she was recommended to resume spironolactone 25 mg daily.  Presents today for follow up independently. Reports she is getting intermittent left sided spasm under her left breast and sometimes right upper chest. Occasionally triggered by cough but otherwise occurs at random with or without activity. Lasting <5 minutes and self resolves. Describes as a "vice" like pain. She requests echocardiogram. Reports no shortness of breath nor dyspnea of exertion.    Reports persistent cough which has been ongoing for years. She has seen Dr. Sherene Sires for pulmonology of this without clear answers. Was on Aciphex which did not make much difference. Dr. Loreta Ave last did her endoscopy and said everything from GI perspective looked  well.   BP at home has been as high as 150 after stressful day but most often 130/80. Limiting to 2 cups of coffee in the morning.   Previous antihypertensives Amlodipine - headache Bidil - headache Lisinopril - cough Metoprolol  Valsartan - cough Verapamil - nausea  ROS: Please see the history of present illness.    All other systems reviewed and are negative.   Studies Reviewed: Marland Kitchen   EKG Interpretation Date/Time:  Friday July 01 2023 08:07:59 EDT Ventricular Rate:  74 PR Interval:  148 QRS Duration:  78 QT Interval:  402 QTC Calculation: 446 R Axis:   39  Text Interpretation: Normal sinus rhythm Normal ECG No acute ST/T wave changes. Confirmed by Gillian Shields ([id number]) on 07/01/2023 8:31:12 AM        Risk Assessment/Calculations:             Physical Exam:   VS:  BP 132/86   Pulse 74   Ht 5\' 5"  (1.651 m)   Wt 187 lb 8 oz (85 kg)   BMI 31.20 kg/m    Wt Readings from Last 3 Encounters:  07/01/23 187 lb 8 oz (85 kg)  06/14/23 184 lb 9.6 oz (83.7 kg)  06/07/23 183 lb (83 kg)    GEN: Well nourished, overweight, well developed in no acute distress NECK: No JVD; No carotid bruits CARDIAC: RRR, no murmurs, rubs, gallops RESPIRATORY:  Clear to auscultation without rales, wheezing or rhonchi  ABDOMEN: Soft, non-tender, non-distended EXTREMITIES:  No edema; No deformity   ASSESSMENT AND PLAN: .    Chest pain in adult / Coronary  calcification / Aortic atherosclerosis -reports chest discomfort under her left breast as well as right chest that lasts less than 5 minutes and she describes as a "vice".  EKG today NSR with no acute ST/T wave changes.  Noted prior coronary calcification and aortic atherosclerosis by previous imaging.  Plan for echocardiogram as well as cardiac CTA.  HTN - intolerant to multiple previous antihypertensives detailed above.  BP reasonably well-controlled.  Continue present regimen hydralazine 150 mg twice daily, hydrochlorothiazide 25 mg daily,  spironolactone 25 mg daily.  If BP persistently difficult to control consider increasing dose of spironolactone. Discussed to monitor BP at home at least 2 hours after medications and sitting for 5-10 minutes.   DM2- 06/14/23 A1c 6.2. Continue to follow with PCP.   HLD, LDL goal less than 100- 06/14/23 LDL 108.  Continue pravastatin 20 mg daily at this time.  Readdress lipid goals based on above testing.       Dispo: follow up in 6 weeks with Dr. Duke Salvia or APP  Signed, Alver Sorrow, NP

## 2023-07-06 ENCOUNTER — Encounter: Payer: Self-pay | Admitting: Nurse Practitioner

## 2023-07-07 ENCOUNTER — Other Ambulatory Visit: Payer: Self-pay | Admitting: Nurse Practitioner

## 2023-07-07 ENCOUNTER — Ambulatory Visit (INDEPENDENT_AMBULATORY_CARE_PROVIDER_SITE_OTHER): Payer: Medicare Other | Admitting: Family Medicine

## 2023-07-07 DIAGNOSIS — R058 Other specified cough: Secondary | ICD-10-CM

## 2023-07-07 MED ORDER — HYDROCODONE BIT-HOMATROP MBR 5-1.5 MG/5ML PO SOLN
5.0000 mL | Freq: Four times a day (QID) | ORAL | 0 refills | Status: DC | PRN
Start: 2023-07-07 — End: 2023-09-05

## 2023-07-07 NOTE — Progress Notes (Signed)
PDMP not reviewed this encounter.  

## 2023-07-08 ENCOUNTER — Telehealth (HOSPITAL_COMMUNITY): Payer: Self-pay | Admitting: *Deleted

## 2023-07-08 NOTE — Telephone Encounter (Signed)
Patient returning call about her upcoming cardiac imaging study; pt verbalizes understanding of appt date/time, parking situation and where to check in, pre-test NPO status and medications ordered, and verified current allergies; name and call back number provided for further questions should they arise  Larey Brick RN Navigator Cardiac Imaging [facility] [phone] office [phone] cell  Patient reports her allergy to metoprolol is nausea, vomiting, and headache.  She will take 100mg  metoprolol tartrate two hours prior to her cardiac CT scan. She is aware to arrive at 8am.

## 2023-07-08 NOTE — Telephone Encounter (Signed)
Attempted to call patient regarding upcoming cardiac CT appointment. °Left message on voicemail with name and callback number ° °Merle Prescott RN Navigator Cardiac Imaging °[facility] °[phone] Office °[phone] Cell ° °

## 2023-07-11 ENCOUNTER — Encounter (HOSPITAL_COMMUNITY): Payer: Self-pay

## 2023-07-11 ENCOUNTER — Ambulatory Visit (HOSPITAL_COMMUNITY)
Admission: RE | Admit: 2023-07-11 | Discharge: 2023-07-11 | Disposition: A | Discharge status: Home / Self Care | Payer: Medicare Other | Source: Ambulatory Visit | Attending: Family | Admitting: Family

## 2023-07-11 DIAGNOSIS — R072 Precordial pain: Secondary | ICD-10-CM | POA: Insufficient documentation

## 2023-07-11 MED ORDER — NITROGLYCERIN 0.4 MG SL SUBL
0.8000 mg | SUBLINGUAL_TABLET | Freq: Once | SUBLINGUAL | Status: AC
  Administered 2023-07-11: 0.8 mg via SUBLINGUAL

## 2023-07-11 MED ORDER — IOHEXOL 350 MG/ML SOLN
95.0000 mL | Freq: Once | INTRAVENOUS | Status: AC | PRN
  Administered 2023-07-11: 95 mL via INTRAVENOUS

## 2023-07-11 MED ORDER — NITROGLYCERIN 0.4 MG SL SUBL
SUBLINGUAL_TABLET | SUBLINGUAL | Status: AC
  Filled 2023-07-11: qty 2

## 2023-07-12 ENCOUNTER — Telehealth (HOSPITAL_BASED_OUTPATIENT_CLINIC_OR_DEPARTMENT_OTHER): Payer: Self-pay

## 2023-07-12 NOTE — Telephone Encounter (Addendum)
Seen by patient Deborah [name] on 07/11/2023  9:03 PM, follow up mychart message sent to patient.    ----- Message from Alver Sorrow sent at 07/11/2023  5:09 PM EDT ----- Coronary calcium score of 256 placing her in 87th percentile for age, sex, and race matched control. 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 Not enough to cause symptoms but enough to be aggressive about prevention.   Start Aspirin EC 81mg  daily.  Ideally would like LDL (bad cholesterol) less than 70. When last checked in June it was 108. Recommend increase Pravastatin to 40mg  daily with repeat FLP/LFT in 2 months.

## 2023-07-14 ENCOUNTER — Encounter: Payer: Self-pay | Admitting: Nurse Practitioner

## 2023-07-15 ENCOUNTER — Ambulatory Visit (HOSPITAL_COMMUNITY): Payer: Medicare Other | Attending: Family

## 2023-07-15 DIAGNOSIS — R072 Precordial pain: Secondary | ICD-10-CM | POA: Insufficient documentation

## 2023-07-15 LAB — ECHOCARDIOGRAM COMPLETE
Area-P 1/2: 3.3 cm2
S' Lateral: 2.8 cm

## 2023-07-18 ENCOUNTER — Encounter: Payer: Self-pay | Admitting: Nurse Practitioner

## 2023-07-18 ENCOUNTER — Other Ambulatory Visit: Payer: Self-pay | Admitting: Nurse Practitioner

## 2023-07-27 NOTE — Telephone Encounter (Signed)
I did speak with the patient about this, would she be able to stop the pravastatin and just do Zetia? She does not want to do both and said she would just wait. Let me know your thoughts.    Kindest Regards,      [name], DNP, FNP-BC   Will forward to Ronn Melena NP for review

## 2023-07-28 NOTE — Telephone Encounter (Signed)
Would recommend Pravastatin at least at low dose 3x per week as it can help to stabilize the existing plaque.   However if she wishes, can trial solely Zetia 10mg  daily and repeat lipid panel in 2 months to reassess.   She could also meet with our pharmacy lipid team to discuss options in detail if she wishes.   Alver Sorrow, NP

## 2023-07-28 NOTE — Telephone Encounter (Signed)
Left message to call back  

## 2023-07-29 NOTE — Telephone Encounter (Signed)
2nd call attempt, no answer, left message for patient to call back!

## 2023-08-01 ENCOUNTER — Encounter (HOSPITAL_BASED_OUTPATIENT_CLINIC_OR_DEPARTMENT_OTHER): Payer: Self-pay

## 2023-08-01 NOTE — Telephone Encounter (Signed)
3rd call attempt, no answer, recommendations mailed to patient. Removing from triage pool at this time.

## 2023-08-04 ENCOUNTER — Other Ambulatory Visit: Payer: Self-pay | Admitting: Nurse Practitioner

## 2023-08-12 ENCOUNTER — Encounter (HOSPITAL_BASED_OUTPATIENT_CLINIC_OR_DEPARTMENT_OTHER): Payer: Self-pay | Admitting: Family

## 2023-08-12 ENCOUNTER — Ambulatory Visit (HOSPITAL_BASED_OUTPATIENT_CLINIC_OR_DEPARTMENT_OTHER): Payer: Medicare Other | Admitting: Family

## 2023-08-12 VITALS — BP 147/82 | HR 74 | Ht 65.0 in | Wt 191.1 lb

## 2023-08-12 DIAGNOSIS — E785 Hyperlipidemia, unspecified: Secondary | ICD-10-CM

## 2023-08-12 DIAGNOSIS — Z7984 Long term (current) use of oral hypoglycemic drugs: Secondary | ICD-10-CM | POA: Diagnosis not present

## 2023-08-12 DIAGNOSIS — E782 Mixed hyperlipidemia: Secondary | ICD-10-CM | POA: Diagnosis not present

## 2023-08-12 DIAGNOSIS — E1169 Type 2 diabetes mellitus with other specified complication: Secondary | ICD-10-CM

## 2023-08-12 DIAGNOSIS — I10 Essential (primary) hypertension: Secondary | ICD-10-CM | POA: Diagnosis not present

## 2023-08-12 DIAGNOSIS — I25118 Atherosclerotic heart disease of native coronary artery with other forms of angina pectoris: Secondary | ICD-10-CM | POA: Diagnosis not present

## 2023-08-12 MED ORDER — EZETIMIBE 10 MG PO TABS: 10.0000 mg | ORAL_TABLET | Freq: Every day | ORAL | 3 refills | Status: DC

## 2023-08-12 MED ORDER — PRAVASTATIN SODIUM 20 MG PO TABS
20.0000 mg | ORAL_TABLET | ORAL | Status: DC
Start: 2023-08-12 — End: 2023-09-07

## 2023-08-12 NOTE — Patient Instructions (Addendum)
Medication Instructions:  Your physician has recommended you make the following change in your medication:   CHANGE Pravastatin to 20mg  three times per week  START Zetia 10mg  daily  *If you need a refill on your cardiac medications before your next appointment, please call your pharmacy*   Lab Work: Will plan to recheck your cholesterol labs as part of your annual physical in November.   Follow-Up: At [facility], you and your health needs are our priority.  As part of our continuing mission to provide you with exceptional heart care, we have created designated Provider Care Teams.  These Care Teams include your primary Cardiologist (physician) and Advanced Practice Providers (APPs -  Physician Assistants and Nurse Practitioners) who all work together to provide you with the care you need, when you need it.  We recommend signing up for the patient portal called "MyChart".  Sign up information is provided on this After Visit Summary.  MyChart is used to connect with patients for Virtual Visits (Telemedicine).  Patients are able to view lab/test results, encounter notes, upcoming appointments, etc.  Non-urgent messages can be sent to your provider as well.   To learn more about what you can do with MyChart, go to [email or web address].    Your next appointment:   As scheduled 12/23/2023 with Dr. Duke Salvia  Other Instructions  Natural remedies have not been found to be effective in lowering cholesterol and preventing progression of aortic atherosclerosis. I've linked a good article below for you if you are interested. It found that low dose statin was significant in reducing cholesterol compared to a number of supplements.   [email or web address]   _______________________  If you want referral, send [place] a MyChart.

## 2023-08-12 NOTE — Progress Notes (Signed)
Cardiology Office Note:  .   Date:  08/12/2023  ID:  Tate Logalbo, DOB 05/19/1951, MRN [id number] PCP: [name], FNP  [facility] Providers Cardiologist:  None    History of Present Illness: .   Tahirih Delbuono is a 72 y.o. female with a history of hypertension, hypothyroidism, DM2, aortic atherosclerosis, coronary calcification seen on CT, hyperlipidemia.  Prior cardiac catheterization in 1994 and 2002 were unremarkable.  CT 2016 with mild coronary calcifications.  Established Dr. Duke Salvia 2021 for elevated BP.  She reported intolerance to many BP medications and was eventually maintained on HCTZ 25 mg daily, hydralazine 100 mg twice daily, spironolactone 25 mg daily.  Hydralazine was later increased to 100 mg 3 times daily.  She was seen 12/27/2022 by Robin Searing, NP to reestablish with cardiology.  Home BP 110/72 to 159/88.  She had been off spironolactone for the past 3 weeks.  She was active participating Silver sneakers 2-3 times per week.  She was recommended to continue home monitoring and continue HCTZ 25 mg.  01/10/2023 as BP persistently elevated she was recommended to increase hydralazine to 100 mg twice daily.  Due to persistent elevated BPs on 02/08/2023 she was recommended to resume spironolactone 25 mg daily.  Last seen 07/01/23 for ongoing dyspnea and occasional intermittent left-sided spasm under the left breast.  Subsequent coronary CTA 07/11/2023 with coronary calcium score 256 placing her in the 87th percentile for age/sex/race matched controlled with mild nonobstructive coronary artery disease (25-49%).  Echocardiogram 07/15/2023 normal LVEF, gr1dd, no RWMA, mild MR, moderate TR.  Presents today for follow up independently. Still getting intermittent left sided spasm under her left breast - reviewed reassuring echo and CTA. Lipid panel 06/14/23 total cholesterol 96, triglycerides 94, HL 61, LDL 108. BP at home  at its highest 142. This morning 136/72.  Taking Pravstatin 4-5 times per week but does note myalgia. Has been working on dietary changes.   Previous antihypertensives Amlodipine - headache Bidil - headache Lisinopril - cough Metoprolol  Valsartan - cough Verapamil - nausea  Previous hyperlipidemia Rosuvastatin -   ROS: Please see the history of present illness.    All other systems reviewed and are negative.   Studies Reviewed: .        Cardiac Studies & Procedures       ECHOCARDIOGRAM  ECHOCARDIOGRAM COMPLETE 07/15/2023  Narrative ECHOCARDIOGRAM REPORT    Patient Name:   Marcia Marotz Date of Exam: 07/15/2023 Medical Rec #:  [id number]               Height:       65.0 in Accession #:    [id number]              Weight:       187.5 lb Date of Birth:  04/15/1951                BSA:          1.925 m Patient Age:    72 years                BP:           145/77 mmHg Patient Gender: F                       HR:           70 bpm. Exam Location:  [address]  Procedure: 2D Echo, Cardiac Doppler, Color Doppler and 3D  Echo  Indications:    Chest Pain R07.9  History:        Patient has no prior history of Echocardiogram examinations. Signs/Symptoms:Shortness of Breath, Chest Pain and Fatigue; Risk Factors:Diabetes, Dyslipidemia, Hypertension, Family History of Coronary Artery Disease and Non-Smoker.  Sonographer:    Farrel Conners RDCS Referring Phys: Alver Sorrow  IMPRESSIONS   1. Left ventricular ejection fraction, by estimation, is 60 to 65%. The left ventricle has normal function. The left ventricle has no regional wall motion abnormalities. Left ventricular diastolic parameters are consistent with Grade I diastolic dysfunction (impaired relaxation). 2. Right ventricular systolic function is normal. The right ventricular size is normal. There is normal pulmonary artery systolic pressure. 3. The mitral valve is normal in structure. Mild mitral valve regurgitation. No evidence of mitral  stenosis. 4. The aortic valve is normal in structure. Aortic valve regurgitation is trivial. No aortic stenosis is present. 5. Pulmonic valve regurgitation is moderate. 6. The inferior vena cava is normal in size with greater than 50% respiratory variability, suggesting right atrial pressure of 3 mmHg.  FINDINGS Left Ventricle: Left ventricular ejection fraction, by estimation, is 60 to 65%. The left ventricle has normal function. The left ventricle has no regional wall motion abnormalities. The left ventricular internal cavity size was normal in size. There is no left ventricular hypertrophy. Left ventricular diastolic parameters are consistent with Grade I diastolic dysfunction (impaired relaxation). Indeterminate filling pressures.  Right Ventricle: The right ventricular size is normal. No increase in right ventricular wall thickness. Right ventricular systolic function is normal. There is normal pulmonary artery systolic pressure. The tricuspid regurgitant velocity is 2.45 m/s, and with an assumed right atrial pressure of 3 mmHg, the estimated right ventricular systolic pressure is 27.0 mmHg.  Left Atrium: Left atrial size was normal in size.  Right Atrium: Right atrial size was normal in size.  Pericardium: There is no evidence of pericardial effusion.  Mitral Valve: The mitral valve is normal in structure. Mild mitral valve regurgitation. No evidence of mitral valve stenosis.  Tricuspid Valve: The tricuspid valve is normal in structure. Tricuspid valve regurgitation is mild . No evidence of tricuspid stenosis.  Aortic Valve: The aortic valve is normal in structure. Aortic valve regurgitation is trivial. No aortic stenosis is present.  Pulmonic Valve: The pulmonic valve was normal in structure. Pulmonic valve regurgitation is moderate. No evidence of pulmonic stenosis.  Aorta: The aortic root is normal in size and structure.  Venous: The inferior vena cava is normal in size with  greater than 50% respiratory variability, suggesting right atrial pressure of 3 mmHg.  IAS/Shunts: No atrial level shunt detected by color flow Doppler.   LEFT VENTRICLE PLAX 2D LVIDd:         4.25 cm   Diastology LVIDs:         2.80 cm   LV e' medial:    5.00 cm/s LV PW:         0.80 cm   LV E/e' medial:  11.8 LV IVS:        0.90 cm   LV e' lateral:   11.00 cm/s LVOT diam:     2.20 cm   LV E/e' lateral: 5.4 LV SV:         84 LV SV Index:   44 LVOT Area:     3.80 cm  3D Volume EF: 3D EF:        65 % LV EDV:       139  ml LV ESV:       48 ml LV SV:        91 ml  RIGHT VENTRICLE RV Basal diam:  3.10 cm RV S prime:     12.70 cm/s TAPSE (M-mode): 1.9 cm RVSP:           27.0 mmHg  LEFT ATRIUM             Index        RIGHT ATRIUM           Index LA diam:        3.80 cm 1.97 cm/m   RA Pressure: 3.00 mmHg LA Vol (A2C):   67.4 ml 35.02 ml/m  RA Area:     13.60 cm LA Vol (A4C):   57.2 ml 29.72 ml/m  RA Volume:   32.00 ml  16.63 ml/m LA Biplane Vol: 64.0 ml 33.25 ml/m AORTIC VALVE LVOT Vmax:   94.15 cm/s LVOT Vmean:  64.800 cm/s LVOT VTI:    0.220 m  AORTA Ao Root diam: 3.00 cm Ao Asc diam:  3.50 cm  MITRAL VALVE                TRICUSPID VALVE MV Area (PHT): cm          TR Peak grad:   24.0 mmHg MV Decel Time: 230 msec     TR Vmax:        245.00 cm/s MV E velocity: 59.00 cm/s   Estimated RAP:  3.00 mmHg MV A velocity: 100.17 cm/s  RVSP:           27.0 mmHg MV E/A ratio:  0.59 SHUNTS Systemic VTI:  0.22 m Systemic Diam: 2.20 cm  Chilton Si MD Electronically signed by Chilton Si MD Signature Date/Time: 07/15/2023/2:07:39 PM    Final     CT SCANS  CT CORONARY MORPH W/CTA COR W/SCORE 07/11/2023  Addendum 07/17/2023  9:32 PM ADDENDUM REPORT: 07/17/2023 21:29  EXAM: OVER-READ INTERPRETATION  CT CHEST  The following report is an over-read performed by radiologist Dr. Donetta Potts [facility] on 07/17/2023. This over-read does not  include interpretation of cardiac or coronary anatomy or pathology. The cardiac interpretation by the cardiologist is attached.  COMPARISON:  07/09/2015.  FINDINGS: The heart is normal in size and there is a trace pericardial effusion. Multi-vessel coronary artery calcifications are noted. The aorta and pulmonary trunk are normal in caliber.  No mediastinal or hilar lymphadenopathy. The esophagus is within normal limits. A small hiatal hernia is noted.  Atelectasis is noted bilaterally.  No effusion or pneumothorax.  No acute abnormality in the upper abdomen.  Degenerative changes are present in the thoracic spine. No acute osseous abnormality.  IMPRESSION: 1. Coronary artery calcifications. 2. Small hiatal hernia.   Electronically Signed By: Thornell Sartorius M.D. On: 07/17/2023 21:29  Narrative CLINICAL DATA:  Chest pain  EXAM: Cardiac/Coronary CTA  TECHNIQUE: A non-contrast, gated CT scan was obtained with axial slices of 3 mm through the heart for calcium scoring. Calcium scoring was performed using the Agatston method. A 120 kV prospective, gated, contrast cardiac scan was obtained. Gantry rotation speed was 250 msecs and collimation was 0.6 mm. Two sublingual nitroglycerin tablets (0.8 mg) were given. The 3D data set was reconstructed in 5% intervals of the 35-75% of the R-R cycle. Diastolic phases were analyzed on a dedicated workstation using MPR, MIP, and VRT modes. The patient received 95 cc of contrast.  FINDINGS: Image quality: Excellent.  Noise artifact is: Limited.  Coronary Arteries:  Normal coronary origin.  Left dominance.  Left main: The left main is a large caliber vessel with a normal take off from the left coronary cusp that bifurcates to form a left anterior descending artery and a left circumflex artery. There is no plaque or stenosis.  Left anterior descending artery: The LAD contains minimal mixed density plaque (<25%). D1 contains mild  calcified plaque (25-49%). D2 is patent.  Left circumflex artery: The LCX is dominant. There is minimal mixed density plaque (<25%). The LCX gives off 2 patent obtuse marginal branches. The LCX terminates as a patent PDA.  Right coronary artery: The RCA is non-dominant with normal take off from the right coronary cusp. There is minimal non-calcified plaque (<25%).  Right Atrium: Right atrial size is within normal limits.  Right Ventricle: The right ventricular cavity is within normal limits.  Left Atrium: Left atrial size is normal in size with no left atrial appendage filling defect.  Left Ventricle: The ventricular cavity size is within normal limits.  Pulmonary arteries: Normal in size.  Pulmonary veins: Normal pulmonary venous drainage.  Pericardium: Normal thickness without significant effusion or calcium present.  Cardiac valves: The aortic valve is trileaflet without significant calcification. The mitral valve is normal without significant calcification.  Aorta: Normal caliber without significant disease.  Extra-cardiac findings: See attached radiology report for non-cardiac structures.  IMPRESSION: 1. Coronary calcium score of 256. This was 87th percentile for age-, sex, and race-matched controls.  2. Total plaque volume 337 mm3 which is 60th percentile for age- and sex-matched controls (calcified plaque 27 mm3; non-calcified plaque 310 mm3). TPV is severe/extensive.  3. Normal coronary origin with left dominance.  4. Minimal mixed density plaque in the LAD (<25%).  5. Mild calcified plaque (25-49%) in D1.  6. Minimal mixed density plaque (<25%) in the LCX.  7. Minimal non-calcified plaque (<25%) in the non-dominant RCA.  RECOMMENDATIONS: 1. Mild non-obstructive CAD (25-49%). Consider non-atherosclerotic causes of chest pain. Consider preventive therapy and risk factor modification.  Lennie Odor, MD  Electronically Signed: By: Lennie Odor  M.D. On: 07/11/2023 12:11          Risk Assessment/Calculations:     HYPERTENSION CONTROL Vitals:   08/12/23 0834 08/12/23 0837 08/12/23 0838  BP: (!) 173/84 (!) 157/83 (!) 147/82    The patient's blood pressure is elevated above target today.  In order to address the patient's elevated BP: Blood pressure will be monitored at home to determine if medication changes need to be made.; The blood pressure is usually elevated in clinic.  Blood pressures monitored at home have been optimal.; Follow up with general cardiology has been recommended.          Physical Exam:   VS:  BP (!) 147/82 (BP Location: Right Arm, Patient Position: Sitting, Cuff Size: Large)   Pulse 74   Ht 5\' 5"  (1.651 m)   Wt 191 lb 1.6 oz (86.7 kg)   SpO2 98%   BMI 31.80 kg/m    Wt Readings from Last 3 Encounters:  08/12/23 191 lb 1.6 oz (86.7 kg)  07/01/23 187 lb 8 oz (85 kg)  06/14/23 184 lb 9.6 oz (83.7 kg)    GEN: Well nourished, overweight, well developed in no acute distress NECK: No JVD; No carotid bruits CARDIAC: RRR, no murmurs, rubs, gallops RESPIRATORY:  Clear to auscultation without rales, wheezing or rhonchi  ABDOMEN: Soft, non-tender, non-distended EXTREMITIES:  No edema; No deformity  ASSESSMENT AND PLAN: .    Coronary calcification / Aortic atherosclerosis - Coronary CTA 06/2023 with mild nonobstructive CAD (25-49%). GDMT Pravastatin, Zetia. Does not tolerate beta blockers.   HTN - intolerant to multiple previous antihypertensives detailed above.  BP reasonably well-controlled by home readings.  Continue present regimen hydralazine 150 mg twice daily, hydrochlorothiazide 25 mg daily, spironolactone 25 mg daily. Discussed to monitor BP at home at least 2 hours after medications and sitting for 5-10 minutes.   DM2- 06/14/23 A1c 6.2. Continue to follow with PCP.   HLD, LDL goal less than 100- 06/14/23 LDL 108.  Hesitant regarding statins and cholesterol injections.  Notes some myalgias with  pravastatin.  She is agreeable to continue pravastatin 20 mg 3 times per week and add Zetia 10 mg daily.  She has upcoming annual physical with her PCP in November and lipids can be reassessed at that time.       Dispo: follow up 12/2023 with Dr. Duke Salvia or APP  Signed, Alver Sorrow, NP

## 2023-09-01 ENCOUNTER — Encounter: Payer: Self-pay | Admitting: Nurse Practitioner

## 2023-09-03 ENCOUNTER — Other Ambulatory Visit: Payer: Self-pay | Admitting: Nurse Practitioner

## 2023-09-03 DIAGNOSIS — E785 Hyperlipidemia, unspecified: Secondary | ICD-10-CM

## 2023-09-03 DIAGNOSIS — G4700 Insomnia, unspecified: Secondary | ICD-10-CM

## 2023-09-03 DIAGNOSIS — E782 Mixed hyperlipidemia: Secondary | ICD-10-CM

## 2023-09-05 ENCOUNTER — Other Ambulatory Visit: Payer: Self-pay | Admitting: Nurse Practitioner

## 2023-09-05 DIAGNOSIS — R058 Other specified cough: Secondary | ICD-10-CM

## 2023-09-05 MED ORDER — HYDROCODONE BIT-HOMATROP MBR 5-1.5 MG/5ML PO SOLN
5.0000 mL | Freq: Four times a day (QID) | ORAL | 0 refills | Status: DC | PRN
Start: 2023-09-05 — End: 2023-10-25

## 2023-10-13 ENCOUNTER — Ambulatory Visit: Payer: Medicare Other | Admitting: Nurse Practitioner

## 2023-10-13 ENCOUNTER — Encounter: Payer: Self-pay | Admitting: Nurse Practitioner

## 2023-10-13 ENCOUNTER — Telehealth (HOSPITAL_BASED_OUTPATIENT_CLINIC_OR_DEPARTMENT_OTHER): Payer: Self-pay

## 2023-10-13 ENCOUNTER — Ambulatory Visit: Payer: Medicare Other

## 2023-10-13 VITALS — BP 140/90 | HR 89 | Temp 98.3°F | Ht 65.0 in | Wt 182.0 lb

## 2023-10-13 VITALS — BP 128/80 | HR 89 | Temp 98.3°F | Ht 65.0 in | Wt 182.2 lb

## 2023-10-13 DIAGNOSIS — I119 Hypertensive heart disease without heart failure: Secondary | ICD-10-CM | POA: Diagnosis not present

## 2023-10-13 DIAGNOSIS — E039 Hypothyroidism, unspecified: Secondary | ICD-10-CM

## 2023-10-13 DIAGNOSIS — I700 Atherosclerosis of aorta: Secondary | ICD-10-CM

## 2023-10-13 DIAGNOSIS — Z0000 Encounter for general adult medical examination without abnormal findings: Secondary | ICD-10-CM

## 2023-10-13 DIAGNOSIS — R5383 Other fatigue: Secondary | ICD-10-CM | POA: Diagnosis not present

## 2023-10-13 DIAGNOSIS — Z23 Encounter for immunization: Secondary | ICD-10-CM

## 2023-10-13 DIAGNOSIS — E782 Mixed hyperlipidemia: Secondary | ICD-10-CM | POA: Diagnosis not present

## 2023-10-13 DIAGNOSIS — E1169 Type 2 diabetes mellitus with other specified complication: Secondary | ICD-10-CM

## 2023-10-13 DIAGNOSIS — E119 Type 2 diabetes mellitus without complications: Secondary | ICD-10-CM | POA: Diagnosis not present

## 2023-10-13 DIAGNOSIS — Z6830 Body mass index (BMI) 30.0-30.9, adult: Secondary | ICD-10-CM

## 2023-10-13 DIAGNOSIS — E559 Vitamin D deficiency, unspecified: Secondary | ICD-10-CM | POA: Diagnosis not present

## 2023-10-13 DIAGNOSIS — E785 Hyperlipidemia, unspecified: Secondary | ICD-10-CM

## 2023-10-13 NOTE — Progress Notes (Signed)
Madelaine Bhat, CMA,acting as a Neurosurgeon for [name], FNP.,have documented all relevant documentation on the behalf of [name], FNP,as directed by  [name], FNP while in the presence of [name], FNP.  Subjective:  Patient ID: Deborah [name] , female    DOB: [birth year] , 72 y.o.   MRN: [id number]  No chief complaint on file.   HPI  Patient presents today for a bp and dm follow up, Patient reports compliance with medication. Patient denies any chest pain, SOB, or headaches. Patient has no concerns today.  She also had her AWV done with the nurse today  Wt Readings from Last 3 Encounters: 10/13/23 : 182 lb (82.6 kg) 10/13/23 : 182 lb 3.2 oz (82.6 kg) 08/12/23 : 191 lb 1.6 oz (86.7 kg)    Diabetes She presents for her follow-up diabetic visit. Diabetes type: prediabetes. Her disease course has been stable. There are no hypoglycemic associated symptoms. Pertinent negatives for hypoglycemia include no dizziness or headaches. There are no diabetic associated symptoms. Pertinent negatives for diabetes include no chest pain, no fatigue, no polydipsia, no polyphagia and no polyuria. There are no hypoglycemic complications. Symptoms are stable. Diabetic complications include heart disease. Risk factors for coronary artery disease include obesity, sedentary lifestyle, hypertension and post-menopausal (prediabetes). Current diabetic treatment includes oral agent (monotherapy). She is compliant with treatment most of the time. Her weight is decreasing steadily. She is following a generally healthy (Continues with [facility], last visit on 06/07/2023) diet. Diabetic meal planning: recommendations with healthy weight and wellness. She has had a previous visit with a dietitian. She participates in exercise intermittently (mostly with walking with work and was going to [employer]). There is no change in her home blood glucose trend. An ACE inhibitor/angiotensin II  receptor blocker is being taken. She does not see a podiatrist.Eye exam is current (awaiting report from most recent eye exam).     Past Medical History:  Diagnosis Date   Back pain    Death of child 82   Due to cord strangulation.   Diabetes mellitus without complication (HCC)    Hyperlipidemia    Hypertension    Hypothyroidism    Joint pain    Nonobstructive atherosclerosis of coronary artery    Osteoarthritis    Other fatigue    Shortness of breath on exertion    Thyroid condition      Family History  Problem Relation Age of Onset   COPD Mother    Diabetes Mother    Hypertension Mother    Obesity Mother    Heart disease Father        died of sudden MI   Hypertension Father    Glaucoma Father      Current Outpatient Medications:    acetaminophen (TYLENOL) 500 MG tablet, Take 1,000 mg by mouth at bedtime., Disp: , Rfl:    aspirin EC 81 MG tablet, Take 81 mg by mouth daily. Swallow whole., Disp: , Rfl:    Blood Glucose Monitoring Suppl (ACURA BLOOD GLUCOSE METER) w/Device KIT, Check blood sugars twice daily. Please provide patient with meter and supplies that is covered by pt ins., Disp: 1 kit, Rfl: 0   Blood Glucose Monitoring Suppl (ONE TOUCH ULTRA MINI) w/Device KIT, Use to check blood sugars 3 times a day. Dx code:e11.65, Disp: 1 kit, Rfl: 0   cholecalciferol (VITAMIN D) 1000 units tablet, Take 2,000 Units by mouth daily., Disp: , Rfl:    COLLAGEN PO, Take  1 tablet by mouth daily., Disp: , Rfl:    cyclobenzaprine (FLEXERIL) 10 MG tablet, Take 1 tablet (10 mg total) by mouth 2 (two) times daily as needed for muscle spasms., Disp: 20 tablet, Rfl: 0   diclofenac Sodium (VOLTAREN) 1 % GEL, Apply 4 g topically 4 (four) times daily., Disp: 100 g, Rfl: 0   docusate sodium (COLACE) 100 MG capsule, Take 100 mg by mouth daily., Disp: , Rfl:    glucose blood (ONETOUCH ULTRA) test strip, USE WITH METER TO CHECK BLOOD SUGAR BEFORE BREAKFAST AND BEFORE DINNER DAILY, Disp: 200  strip, Rfl: 2   hydrALAZINE (APRESOLINE) 100 MG tablet, TAKE 1 AND 1/2 TABLETS BY MOUTH  TWICE DAILY, Disp: 270 tablet, Rfl: 3   hydrochlorothiazide (HYDRODIURIL) 25 MG tablet, TAKE 1 TABLET BY MOUTH DAILY, Disp: 90 tablet, Rfl: 2   HYDROcodone bit-homatropine (HYCODAN) 5-1.5 MG/5ML syrup, Take 5 mLs by mouth every 6 (six) hours as needed for cough., Disp: 240 mL, Rfl: 0   levothyroxine (SYNTHROID) 88 MCG tablet, TAKE 1 TABLET BY MOUTH DAILY  BEFORE BREAKFAST, Disp: 100 tablet, Rfl: 2   MAGNESIUM PO, Take 375 mg by mouth daily., Disp: , Rfl:    Melatonin 10 MG CAPS, Take 10 mg by mouth at bedtime., Disp: , Rfl:    metFORMIN (GLUCOPHAGE-XR) 500 MG 24 hr tablet, Take 1 tablet (500 mg total) by mouth 2 (two) times daily., Disp: 180 tablet, Rfl: 0   naproxen sodium (ALEVE) 220 MG tablet, Take 220 mg by mouth daily as needed., Disp: , Rfl:    pravastatin (PRAVACHOL) 20 MG tablet, TAKE 1 TABLET BY MOUTH EVERY DAY IN THE EVENING (Patient not taking: Reported on 10/13/2023), Disp: 90 tablet, Rfl: 3   spironolactone (ALDACTONE) 25 MG tablet, TAKE 1 TABLET (25 MG TOTAL) BY MOUTH DAILY., Disp: 90 tablet, Rfl: 1   Turmeric 500 MG CAPS, Take 1 tablet by mouth as needed (Takes occassionally)., Disp: , Rfl:    Allergies  Allergen Reactions   Amlodipine     headache   Aspartame And Phenylalanine Other (See Comments)    Causes migraines   Bidil [Isosorb Dinitrate-Hydralazine]     headache   Elavil [Amitriptyline] Other (See Comments)    Loss of balance, hallucinations   Erythromycin     Ask patient to specify type/severity/reaction. Not indicated on medical history form dated 11/04/10.   Fentanyl     Causes panic attacks   Ibuprofen     Ask patient to specify type/severity/reaction. Not noted on medical history form dated 11/04/10.   Lisinopril Cough   Metoprolol Other (See Comments)   Phenylalanine Other (See Comments)    Causes migraine   Prilosec [Omeprazole] Nausea And Vomiting   Rosuvastatin  Other (See Comments)   Tramadol Nausea And Vomiting   Valsartan Cough   Verapamil Nausea And Vomiting   Zantac [Ranitidine Hcl]      Review of Systems  Constitutional: Negative.  Negative for fatigue.  HENT: Negative.    Eyes: Negative.   Respiratory: Negative.    Cardiovascular: Negative.  Negative for chest pain.  Gastrointestinal: Negative.   Endocrine: Negative for polydipsia, polyphagia and polyuria.  Neurological: Negative.  Negative for dizziness and headaches.  Psychiatric/Behavioral: Negative.       Today's Vitals   10/13/23 0917  BP: (!) 140/90  Pulse: 89  Temp: 98.3 F (36.8 C)  TempSrc: Oral  Weight: 182 lb (82.6 kg)  Height: 5\' 5"  (1.651 m)  PainSc: 7  Body mass index is 30.29 kg/m.  Wt Readings from Last 3 Encounters:  10/13/23 182 lb (82.6 kg)  10/13/23 182 lb 3.2 oz (82.6 kg)  08/12/23 191 lb 1.6 oz (86.7 kg)       10/13/2023    9:17 AM 06/14/2023    8:46 AM 11/02/2022    8:56 AM 09/29/2022    9:17 AM 07/01/2022    8:29 AM  Depression screen PHQ 2/9  Decreased Interest 0 0 0 0 0  Down, Depressed, Hopeless 0 1 0 0 0  PHQ - 2 Score 0 1 0 0 0  Altered sleeping 3 2     Tired, decreased energy 0 1     Change in appetite 0 0     Feeling bad or failure about yourself  0 0     Trouble concentrating 0 0     Moving slowly or fidgety/restless 0 0     Suicidal thoughts 0 0     PHQ-9 Score 3 4     Difficult doing work/chores Not difficult at all Not difficult at all       Objective:  Physical Exam Vitals reviewed.  Constitutional:      General: She is not in acute distress.    Appearance: Normal appearance.  Cardiovascular:     Rate and Rhythm: Normal rate and regular rhythm.     Pulses: Normal pulses.     Heart sounds: Normal heart sounds. No murmur heard. Pulmonary:     Effort: Pulmonary effort is normal. No respiratory distress.     Breath sounds: Normal breath sounds. No wheezing.  Skin:    Capillary Refill: Capillary refill takes less  than 2 seconds.  Neurological:     General: No focal deficit present.     Mental Status: She is alert and oriented to person, place, and time.     Cranial Nerves: No cranial nerve deficit.     Motor: No weakness.  Psychiatric:        Mood and Affect: Mood normal.        Behavior: Behavior normal.        Thought Content: Thought content normal.        Judgment: Judgment normal.         Assessment And Plan:  Type 2 diabetes mellitus without complication, without long-term current use of insulin (HCC) Assessment & Plan: Stable, will check HgbA1c. She is encouraged to schedule appt with ophthalmologist. If unable to get in with them she can call to office to schedule for her diabetic eye exam in Sept  Orders: -     Hemoglobin A1c -     Microalbumin / creatinine urine ratio -     CMP14+EGFR  Hypertensive heart disease without heart failure Assessment & Plan: Blood pressure is improved with repeat done by AWV nurse, continue to limit intake of caffeinated drinks.  Continue current medications.  Orders: -     CMP14+EGFR  Mixed hyperlipidemia Assessment & Plan: Stable, continue statin  Orders: -     Lipid panel -     CMP14+EGFR  Acquired hypothyroidism Assessment & Plan: Thyroid levels are normal.  Continue current medications.  Will check thyroid levels today.  Orders: -     TSH + free T4  Vitamin D deficiency Assessment & Plan: Will check vitamin D level and supplement as needed.    Also encouraged to spend 15 minutes in the sun daily.    Orders: -  VITAMIN D 25 Hydroxy (Vit-D Deficiency, Fractures)  Atherosclerosis of aorta (HCC) Assessment & Plan: She is not taking her statin at this time, I have sent a message to her Cardiology team as well to see if there are any other options for her   BMI 30.0-30.9,adult Assessment & Plan: Congratulated on her continued weight loss.    Other fatigue Assessment & Plan: Will check for metabolic causes.  I do not  see that she has had a sleep study this may be something to consider in the future especially if her labs are normal.  Orders: -     CBC -     Vitamin B12 -     Iron, TIBC and Ferritin Panel    No follow-ups on file.  Patient was given opportunity to ask questions. Patient verbalized understanding of the plan and was able to repeat key elements of the plan. All questions were answered to their satisfaction.    Jeanell Sparrow, FNP, have reviewed all documentation for this visit. The documentation on 10/13/23 for the exam, diagnosis, procedures, and orders are all accurate and complete.   IF YOU HAVE BEEN REFERRED TO A SPECIALIST, IT MAY TAKE 1-2 WEEKS TO SCHEDULE/PROCESS THE REFERRAL. IF YOU HAVE NOT HEARD FROM US/SPECIALIST IN TWO WEEKS, PLEASE GIVE [place] A CALL AT [phone].

## 2023-10-13 NOTE — Progress Notes (Signed)
Subjective:   Deborah [name] is a 72 y.o. female who presents for Medicare Annual (Subsequent) preventive examination.  Visit Complete: In person    Cardiac Risk Factors include: advanced age (>60men, >14 women);diabetes mellitus;dyslipidemia;hypertension;obesity (BMI >30kg/m2)     Objective:    Today's Vitals   10/13/23 0907 10/13/23 0928  BP: (!) 140/90 128/80  Pulse: 89   Temp: 98.3 F (36.8 C)   TempSrc: Oral   SpO2: 98%   Weight: 182 lb 3.2 oz (82.6 kg)   Height: 5\' 5"  (1.651 m)   PainSc: 7     Body mass index is 30.32 kg/m.     10/13/2023    9:15 AM 09/29/2022    9:16 AM 09/02/2021   10:08 AM 05/02/2021    2:38 PM 10/15/2020    8:41 AM 10/17/2019    8:49 AM 10/11/2018   10:15 AM  Advanced Directives  Does Patient Have a Medical Advance Directive? Yes Yes Yes No Yes Yes No  Type of Estate agent of [place];Living will Healthcare Power of [place];Living will Healthcare Power of [place];Living will  Healthcare Power of [place];Living will Healthcare Power of [place];Living will   Copy of Healthcare Power of Attorney in Chart? No - copy requested No - copy requested No - copy requested  No - copy requested No - copy requested   Would patient like information on creating a medical advance directive?    Yes (ED - Information included in AVS)   Yes (MAU/Ambulatory/Procedural Areas - Information given)    Current Medications (verified) Outpatient Encounter Medications as of 10/13/2023  Medication Sig   acetaminophen (TYLENOL) 500 MG tablet Take 1,000 mg by mouth at bedtime.   aspirin EC 81 MG tablet Take 81 mg by mouth daily. Swallow whole.   Blood Glucose Monitoring Suppl (ACURA BLOOD GLUCOSE METER) w/Device KIT Check blood sugars twice daily. Please provide patient with meter and supplies that is covered by pt ins.   Blood Glucose Monitoring Suppl (ONE TOUCH ULTRA MINI) w/Device KIT Use to check blood sugars 3 times a day. Dx  code:e11.65   cholecalciferol (VITAMIN D) 1000 units tablet Take 2,000 Units by mouth daily.   COLLAGEN PO Take 1 tablet by mouth daily.   cyclobenzaprine (FLEXERIL) 10 MG tablet Take 1 tablet (10 mg total) by mouth 2 (two) times daily as needed for muscle spasms.   diclofenac Sodium (VOLTAREN) 1 % GEL Apply 4 g topically 4 (four) times daily.   docusate sodium (COLACE) 100 MG capsule Take 100 mg by mouth daily.   hydrALAZINE (APRESOLINE) 100 MG tablet TAKE 1 AND 1/2 TABLETS BY MOUTH  TWICE DAILY   hydrochlorothiazide (HYDRODIURIL) 25 MG tablet TAKE 1 TABLET BY MOUTH DAILY   HYDROcodone bit-homatropine (HYCODAN) 5-1.5 MG/5ML syrup Take 5 mLs by mouth every 6 (six) hours as needed for cough.   levothyroxine (SYNTHROID) 88 MCG tablet TAKE 1 TABLET BY MOUTH DAILY  BEFORE BREAKFAST   MAGNESIUM PO Take 375 mg by mouth daily.   Melatonin 10 MG CAPS Take 10 mg by mouth at bedtime.   metFORMIN (GLUCOPHAGE-XR) 500 MG 24 hr tablet Take 1 tablet (500 mg total) by mouth 2 (two) times daily.   naproxen sodium (ALEVE) 220 MG tablet Take 220 mg by mouth daily as needed.   spironolactone (ALDACTONE) 25 MG tablet TAKE 1 TABLET (25 MG TOTAL) BY MOUTH DAILY.   Turmeric 500 MG CAPS Take 1 tablet by mouth as needed (Takes occassionally).   ezetimibe (  ZETIA) 10 MG tablet Take 1 tablet (10 mg total) by mouth daily. (Patient not taking: Reported on 10/13/2023)   glucose blood (ONETOUCH ULTRA) test strip USE WITH METER TO CHECK BLOOD SUGAR BEFORE BREAKFAST AND BEFORE DINNER DAILY   pravastatin (PRAVACHOL) 20 MG tablet TAKE 1 TABLET BY MOUTH EVERY DAY IN THE EVENING (Patient not taking: Reported on 10/13/2023)   No facility-administered encounter medications on file as of 10/13/2023.    Allergies (verified) Amlodipine, Aspartame and phenylalanine, Bidil [isosorb dinitrate-hydralazine], Elavil [amitriptyline], Erythromycin, Fentanyl, Ibuprofen, Lisinopril, Metoprolol, Phenylalanine, Prilosec [omeprazole], Rosuvastatin,  Tramadol, Valsartan, Verapamil, and Zantac [ranitidine hcl]   History: Past Medical History:  Diagnosis Date   Back pain    Death of child 56   Due to cord strangulation.   Diabetes mellitus without complication (HCC)    Hyperlipidemia    Hypertension    Hypothyroidism    Joint pain    Nonobstructive atherosclerosis of coronary artery    Osteoarthritis    Other fatigue    Shortness of breath on exertion    Thyroid condition    Past Surgical History:  Procedure Laterality Date   APPENDECTOMY  1974   CARDIAC CATHETERIZATION     two, dates not provided.   CHOLECYSTECTOMY     confirm date with patient, was it 58?   LAPAROSCOPIC TUBAL LIGATION  1987   THYROIDECTOMY     Family History  Problem Relation Age of Onset   COPD Mother    Diabetes Mother    Hypertension Mother    Obesity Mother    Heart disease Father        died of sudden MI   Hypertension Father    Glaucoma Father    Social History   Socioeconomic History   Marital status: Married    Spouse name: Donnabelle Ruplinger   Number of children: Not on file   Years of education: Not on file   Highest education level: Not on file  Occupational History   Occupation: retired    Associate Professor: [employer]  Tobacco Use   Smoking status: Never   Smokeless tobacco: Never  Vaping Use   Vaping status: Never Used  Substance and Sexual Activity   Alcohol use: Not Currently   Drug use: No   Sexual activity: Not Currently  Other Topics Concern   Not on file  Social History Narrative   Not on file   Social Determinants of Health   Financial Resource Strain: Low Risk  (10/13/2023)   Overall Financial Resource Strain (CARDIA)    Difficulty of Paying Living Expenses: Not hard at all  Food Insecurity: No Food Insecurity (10/13/2023)   Hunger Vital Sign    Worried About Running Out of Food in the Last Year: Never true    Ran Out of Food in the Last Year: Never true  Transportation Needs: No Transportation Needs  (10/13/2023)   PRAPARE - Administrator, Civil Service (Medical): No    Lack of Transportation (Non-Medical): No  Physical Activity: Inactive (10/13/2023)   Exercise Vital Sign    Days of Exercise per Week: 0 days    Minutes of Exercise per Session: 0 min  Stress: Stress Concern Present (10/13/2023)   [employer] of Occupational Health - Occupational Stress Questionnaire    Feeling of Stress : To some extent  Social Connections: Moderately Integrated (10/13/2023)   Social Connection and Isolation Panel [NHANES]    Frequency of Communication with Friends and Family: More than three  times a week    Frequency of Social Gatherings with Friends and Family: Twice a week    Attends Religious Services: More than 4 times per year    Active Member of [employer] or Organizations: No    Attends Engineer, structural: Never    Marital Status: Married    Tobacco Counseling Counseling given: Not Answered   Clinical Intake:  Pre-visit preparation completed: Yes  Pain : 0-10 Pain Score: 7  Pain Type: Chronic pain Pain Location: Knee Pain Orientation: Left Pain Descriptors / Indicators: Aching Pain Onset: More than a month ago Pain Frequency: Constant     Nutritional Status: BMI > 30  Obese Nutritional Risks: None Diabetes: Yes CBG done?: No Did pt. bring in CBG monitor from home?: No  How often do you need to have someone help you when you read instructions, pamphlets, or other written materials from your doctor or pharmacy?: 1 - Never  Interpreter Needed?: No  Information entered by :: NAllen LPN   Activities of Daily Living    10/13/2023    9:08 AM  In your present state of health, do you have any difficulty performing the following activities:  Hearing? 0  Vision? 0  Difficulty concentrating or making decisions? 0  Walking or climbing stairs? 1  Comment due to knee  Dressing or bathing? 0  Doing errands, shopping? 0  Preparing Food and eating ? N   Using the Toilet? N  In the past six months, have you accidently leaked urine? Y  Do you have problems with loss of bowel control? N  Managing your Medications? N  Managing your Finances? N  Housekeeping or managing your Housekeeping? N    Patient Care Team: [name], FNP as PCP - General (General Practice)  Indicate any recent Medical Services you may have received from other than [facility] providers in the past year (date may be approximate).     Assessment:   This is a routine wellness examination for Deborah [name].  Hearing/Vision screen Hearing Screening - Comments:: Denies hearing issues Vision Screening - Comments:: No regular eye exams, [facility]   Goals Addressed             This Visit's Progress    Patient Stated       10/13/2023, wants to lose weight       Depression Screen    10/13/2023    9:17 AM 06/14/2023    8:46 AM 11/02/2022    8:56 AM 09/29/2022    9:17 AM 07/01/2022    8:29 AM 09/02/2021   10:10 AM 01/27/2021    9:04 AM  PHQ 2/9 Scores  PHQ - 2 Score 0 1 0 0 0 0 2  PHQ- 9 Score 3 4     9     Fall Risk    10/13/2023    9:16 AM 06/14/2023    8:45 AM 11/02/2022    8:56 AM 09/29/2022    9:16 AM 07/01/2022    8:29 AM  Fall Risk   Falls in the past year? 0 0 0 0 0  Number falls in past yr: 0 0 0 0 0  Injury with Fall? 0 0 0 0 0  Risk for fall due to : Medication side effect;Impaired balance/gait No Fall Risks No Fall Risks Medication side effect   Follow up Falls prevention discussed;Falls evaluation completed Falls evaluation completed Falls evaluation completed Falls prevention discussed;Education provided;Falls evaluation completed Falls evaluation completed    MEDICARE RISK  AT HOME: Medicare Risk at Home Any stairs in or around the home?: No If so, are there any without handrails?: No Home free of loose throw rugs in walkways, pet beds, electrical cords, etc?: Yes Adequate lighting in your home to reduce risk of falls?: Yes Life alert?:  No Use of a cane, walker or w/c?: No Grab bars in the bathroom?: Yes Shower chair or bench in shower?: No Elevated toilet seat or a handicapped toilet?: No  TIMED UP AND GO:  Was the test performed?  Yes  Length of time to ambulate 10 feet: 6 sec Gait slow and steady without use of assistive device    Cognitive Function:        10/13/2023    9:18 AM 09/29/2022    9:19 AM 09/02/2021   10:12 AM 10/15/2020    8:43 AM 10/17/2019    8:52 AM  6CIT Screen  What Year? 0 points 0 points 0 points 0 points 0 points  What month? 0 points 0 points 0 points 0 points 0 points  What time? 0 points 0 points 0 points 0 points 0 points  Count back from 20 0 points 0 points 0 points 0 points 0 points  Months in reverse 0 points 0 points 0 points 0 points 0 points  Repeat phrase 2 points 2 points 6 points 0 points 2 points  Total Score 2 points 2 points 6 points 0 points 2 points    Immunizations Immunization History  Administered Date(s) Administered   Fluad Quad(high Dose 65+) 09/16/2022   Fluad Trivalent(High Dose 65+) 10/13/2023   Influenza, High Dose Seasonal PF 10/11/2018   Influenza-Unspecified 10/12/2020, 10/08/2021   PFIZER(Purple Top)SARS-COV-2 Vaccination 12/22/2019, 01/09/2020, 09/16/2020   Pfizer Covid-19 Vaccine Bivalent Booster 38yrs & up 12/24/2021   Pneumococcal Conjugate-13 10/17/2018   Pneumococcal Polysaccharide-23 10/28/2021   Tdap 10/19/2019   Zoster Recombinant(Shingrix) 07/01/2022, 11/02/2022    TDAP status: Up to date  Flu Vaccine status: Completed at today's visit  Pneumococcal vaccine status: Up to date  Covid-19 vaccine status: Information provided on how to obtain vaccines.   Qualifies for Shingles Vaccine? Yes   Zostavax completed Yes   Shingrix Completed?: Yes  Screening Tests Health Maintenance  Topic Date Due   OPHTHALMOLOGY EXAM  04/29/2023   COVID-19 Vaccine (5 - 2023-24 season) 08/21/2023   Diabetic kidney evaluation - Urine ACR   11/04/2023   FOOT EXAM  11/27/2023   HEMOGLOBIN A1C  12/14/2023   Diabetic kidney evaluation - eGFR measurement  06/13/2024   MAMMOGRAM  07/08/2024   Medicare Annual Wellness (AWV)  10/12/2024   Colonoscopy  03/19/2026   DTaP/Tdap/Td (2 - Td or Tdap) 10/18/2029   Pneumonia Vaccine 82+ Years old  Completed   INFLUENZA VACCINE  Completed   DEXA SCAN  Completed   Hepatitis C Screening  Completed   Zoster Vaccines- Shingrix  Completed   HPV VACCINES  Aged Out    Health Maintenance  Health Maintenance Due  Topic Date Due   OPHTHALMOLOGY EXAM  04/29/2023   COVID-19 Vaccine (5 - 2023-24 season) 08/21/2023   Diabetic kidney evaluation - Urine ACR  11/04/2023    Colorectal cancer screening: Type of screening: Colonoscopy. Completed 03/19/2022. Repeat every 4 years  Mammogram status: Completed 07/08/2022. Repeat every year  Bone Density status: Completed 12/10/2020.   Lung Cancer Screening: (Low Dose CT Chest recommended if Age 51-80 years, 20 pack-year currently smoking OR have quit w/in 15years.) does not qualify.   Lung Cancer  Screening Referral: no  Additional Screening:  Hepatitis C Screening: does qualify; Completed 10/11/2018  Vision Screening: Recommended annual ophthalmology exams for early detection of glaucoma and other disorders of the eye. Is the patient up to date with their annual eye exam?  No  Who is the provider or what is the name of the office in which the patient attends annual eye exams? [facility] If pt is not established with a provider, would they like to be referred to a provider to establish care? No .   Dental Screening: Recommended annual dental exams for proper oral hygiene  Diabetic Foot Exam: Diabetic Foot Exam: Completed 11/26/2022  Community Resource Referral / Chronic Care Management: CRR required this visit?  No   CCM required this visit?  No     Plan:     I have personally reviewed and noted the following in the patient's chart:    Medical and social history Use of alcohol, tobacco or illicit drugs  Current medications and supplements including opioid prescriptions. Patient is not currently taking opioid prescriptions. Functional ability and status Nutritional status Physical activity Advanced directives List of other physicians Hospitalizations, surgeries, and ER visits in previous 12 months Vitals Screenings to include cognitive, depression, and falls Referrals and appointments  In addition, I have reviewed and discussed with patient certain preventive protocols, quality metrics, and best practice recommendations. A written personalized care plan for preventive services as well as general preventive health recommendations were provided to patient.     Barb Merino, LPN   44/12/270   After Visit Summary: (In Person-Printed) AVS printed and given to the patient  Nurse Notes: none

## 2023-10-13 NOTE — Patient Instructions (Signed)
Deborah [name] , Thank you for taking time to come for your Medicare Wellness Visit. I appreciate your ongoing commitment to your health goals. Please review the following plan we discussed and let me know if I can assist you in the future.   Referrals/Orders/Follow-Ups/Clinician Recommendations: none  This is a list of the screening recommended for you and due dates:  Health Maintenance  Topic Date Due   Eye exam for diabetics  04/29/2023   Flu Shot  07/21/2023   COVID-19 Vaccine (5 - 2023-24 season) 08/21/2023   Yearly kidney health urinalysis for diabetes  11/04/2023   Complete foot exam   11/27/2023   Hemoglobin A1C  12/14/2023   Yearly kidney function blood test for diabetes  06/13/2024   Mammogram  07/08/2024   Medicare Annual Wellness Visit  10/12/2024   Colon Cancer Screening  03/19/2026   DTaP/Tdap/Td vaccine (2 - Td or Tdap) 10/18/2029   Pneumonia Vaccine  Completed   DEXA scan (bone density measurement)  Completed   Hepatitis C Screening  Completed   Zoster (Shingles) Vaccine  Completed   HPV Vaccine  Aged Out    Advanced directives: (Copy Requested) Please bring a copy of your health care power of attorney and living will to the office to be added to your chart at your convenience.  Next Medicare Annual Wellness Visit scheduled for next year: No., office will schedule appointment  Insert Preventive Care attachment Insert FALL PREVENTION attachment if needed

## 2023-10-13 NOTE — Telephone Encounter (Addendum)
Referral placed, Zetia removed from med list. Left detailed message about referral being placed.    ----- Message from Alver Sorrow sent at 10/13/2023  9:56 AM EDT ----- Regarding: PharmD lipid referral Received note from PCP that Deborah [name] is not taking Zetia due to leg cramps and is taking Pravastatin intermittently. LDL goal <70 due to coronary artery disease. Can we please place referral to PharmD lipid clinic for further management of hyperlipidemia? Thank you!  Alver Sorrow, NP

## 2023-10-14 LAB — CBC
Hematocrit: 35 % (ref 34.0–46.6)
Hemoglobin: 10.8 g/dL — ABNORMAL LOW (ref 11.1–15.9)
MCH: 27.9 pg (ref 26.6–33.0)
MCHC: 30.9 g/dL — ABNORMAL LOW (ref 31.5–35.7)
MCV: 90 fL (ref 79–97)
Platelets: 446 10*3/uL (ref 150–450)
RBC: 3.87 x10E6/uL (ref 3.77–5.28)
RDW: 12.9 % (ref 11.7–15.4)
WBC: 4.1 10*3/uL (ref 3.4–10.8)

## 2023-10-14 LAB — MICROALBUMIN / CREATININE URINE RATIO
Creatinine, Urine: 48.9 mg/dL
Microalb/Creat Ratio: <6 mg/g{creat} (ref 0–29)
Microalbumin, Urine: <3 ug/mL

## 2023-10-14 LAB — CMP14+EGFR
ALT: 13 IU/L (ref 0–32)
AST: 17 IU/L (ref 0–40)
Albumin: 4.5 g/dL (ref 3.8–4.8)
Alkaline Phosphatase: 85 IU/L (ref 44–121)
BUN/Creatinine Ratio: 23 (ref 12–28)
BUN: 15 mg/dL (ref 8–27)
Bilirubin Total: 0.5 mg/dL (ref 0.0–1.2)
CO2: 22 mmol/L (ref 20–29)
Calcium: 9.6 mg/dL (ref 8.7–10.3)
Chloride: 92 mmol/L — ABNORMAL LOW (ref 96–106)
Creatinine, Ser: 0.65 mg/dL (ref 0.57–1.00)
Globulin, Total: 2.6 g/dL (ref 1.5–4.5)
Glucose: 94 mg/dL (ref 70–99)
Potassium: 4.1 mmol/L (ref 3.5–5.2)
Sodium: 131 mmol/L — ABNORMAL LOW (ref 134–144)
Total Protein: 7.1 g/dL (ref 6.0–8.5)
eGFR: 94 mL/min/1.73 (ref >59)

## 2023-10-14 LAB — IRON,TIBC AND FERRITIN PANEL
Ferritin: 56 ng/mL (ref 15–150)
Iron Saturation: 19 % (ref 15–55)
Iron: 68 ug/dL (ref 27–139)
Total Iron Binding Capacity: 362 ug/dL (ref 250–450)
UIBC: 294 ug/dL (ref 118–369)

## 2023-10-14 LAB — VITAMIN D 25 HYDROXY (VIT D DEFICIENCY, FRACTURES): Vit D, 25-Hydroxy: 52.7 ng/mL (ref 30.0–100.0)

## 2023-10-14 LAB — LIPID PANEL
Chol/HDL Ratio: 3.6 ratio (ref 0.0–4.4)
Cholesterol, Total: 218 mg/dL — ABNORMAL HIGH (ref 100–199)
HDL: 60 mg/dL (ref >39)
LDL Chol Calc (NIH): 142 mg/dL — ABNORMAL HIGH (ref 0–99)
Triglycerides: 93 mg/dL (ref 0–149)
VLDL Cholesterol Cal: 16 mg/dL (ref 5–40)

## 2023-10-14 LAB — HEMOGLOBIN A1C
Est. average glucose Bld gHb Est-mCnc: 123 mg/dL
Hgb A1c MFr Bld: 5.9 % — ABNORMAL HIGH (ref 4.8–5.6)

## 2023-10-14 LAB — VITAMIN B12: Vitamin B-12: 1014 pg/mL (ref 232–1245)

## 2023-10-14 LAB — TSH+FREE T4
Free T4: 1.62 ng/dL (ref 0.82–1.77)
TSH: 2.9 u[IU]/mL (ref 0.450–4.500)

## 2023-10-19 NOTE — Assessment & Plan Note (Signed)
Stable, continue statin 

## 2023-10-19 NOTE — Assessment & Plan Note (Signed)
She is not taking her statin at this time, I have sent a message to her Cardiology team as well to see if there are any other options for her

## 2023-10-19 NOTE — Assessment & Plan Note (Signed)
Will check for metabolic causes.  I do not see that she has had a sleep study this may be something to consider in the future especially if her labs are normal.

## 2023-10-19 NOTE — Assessment & Plan Note (Signed)
Thyroid levels are normal.  Continue current medications.  Will check thyroid levels today.

## 2023-10-19 NOTE — Assessment & Plan Note (Addendum)
Blood pressure is improved with repeat done by AWV nurse, continue to limit intake of caffeinated drinks.  Continue current medications.

## 2023-10-19 NOTE — Assessment & Plan Note (Signed)
Congratulated on her continued weight loss.

## 2023-10-19 NOTE — Assessment & Plan Note (Signed)
Will check vitamin D level and supplement as needed.    Also encouraged to spend 15 minutes in the sun daily.   

## 2023-10-19 NOTE — Assessment & Plan Note (Signed)
Stable, will check HgbA1c. She is encouraged to schedule appt with ophthalmologist. If unable to get in with them she can call to office to schedule for her diabetic eye exam in Sept

## 2023-10-23 ENCOUNTER — Other Ambulatory Visit: Payer: Self-pay | Admitting: Nurse Practitioner

## 2023-10-23 DIAGNOSIS — I10 Essential (primary) hypertension: Secondary | ICD-10-CM

## 2023-10-24 ENCOUNTER — Encounter: Payer: Self-pay | Admitting: Nurse Practitioner

## 2023-10-24 ENCOUNTER — Other Ambulatory Visit: Payer: Self-pay | Admitting: Nurse Practitioner

## 2023-10-24 DIAGNOSIS — Z1231 Encounter for screening mammogram for malignant neoplasm of breast: Secondary | ICD-10-CM

## 2023-10-25 ENCOUNTER — Other Ambulatory Visit: Payer: Self-pay | Admitting: Nurse Practitioner

## 2023-10-25 DIAGNOSIS — R058 Other specified cough: Secondary | ICD-10-CM

## 2023-10-25 MED ORDER — HYDROCODONE BIT-HOMATROP MBR 5-1.5 MG/5ML PO SOLN: 5.0000 mL | Freq: Four times a day (QID) | ORAL | 0 refills | Status: DC | PRN

## 2023-10-26 ENCOUNTER — Ambulatory Visit
Admission: RE | Admit: 2023-10-26 | Discharge: 2023-10-26 | Disposition: A | Discharge status: Home / Self Care | Payer: Medicare Other | Source: Ambulatory Visit | Attending: Nurse Practitioner | Admitting: Nurse Practitioner

## 2023-10-26 DIAGNOSIS — Z1231 Encounter for screening mammogram for malignant neoplasm of breast: Secondary | ICD-10-CM | POA: Diagnosis not present

## 2023-11-01 ENCOUNTER — Ambulatory Visit: Payer: Medicare Other

## 2023-11-14 NOTE — Progress Notes (Unsigned)
Madelaine Bhat, CMA,acting as a Neurosurgeon for [name], FNP.,have documented all relevant documentation on the behalf of [name], FNP,as directed by  [name], FNP while in the presence of [name], FNP.  Subjective:    Patient ID: Deborah [name] , female    DOB: 06/11/1951 , 72 y.o.   MRN: [id number]  No chief complaint on file.   HPI  Patient presents today for HM, Patient reports compliance with medication. Patient denies any chest pain, SOB, or headaches. Patient has no concerns today.     Past Medical History:  Diagnosis Date  . Back pain   . Death of child 42   Due to cord strangulation.  . Diabetes mellitus without complication (HCC)   . Hyperlipidemia   . Hypertension   . Hypothyroidism   . Joint pain   . Nonobstructive atherosclerosis of coronary artery   . Osteoarthritis   . Other fatigue   . Shortness of breath on exertion   . Thyroid condition      Family History  Problem Relation Age of Onset  . COPD Mother   . Diabetes Mother   . Hypertension Mother   . Obesity Mother   . Heart disease Father        died of sudden MI  . Hypertension Father   . Glaucoma Father      Current Outpatient Medications:  .  acetaminophen (TYLENOL) 500 MG tablet, Take 1,000 mg by mouth at bedtime., Disp: , Rfl:  .  aspirin EC 81 MG tablet, Take 81 mg by mouth daily. Swallow whole., Disp: , Rfl:  .  Blood Glucose Monitoring Suppl (ACURA BLOOD GLUCOSE METER) w/Device KIT, Check blood sugars twice daily. Please provide patient with meter and supplies that is covered by pt ins., Disp: 1 kit, Rfl: 0 .  Blood Glucose Monitoring Suppl (ONE TOUCH ULTRA MINI) w/Device KIT, Use to check blood sugars 3 times a day. Dx code:e11.65, Disp: 1 kit, Rfl: 0 .  cholecalciferol (VITAMIN D) 1000 units tablet, Take 2,000 Units by mouth daily., Disp: , Rfl:  .  COLLAGEN PO, Take 1 tablet by mouth daily., Disp: , Rfl:  .  cyclobenzaprine (FLEXERIL) 10 MG tablet, Take 1 tablet  (10 mg total) by mouth 2 (two) times daily as needed for muscle spasms., Disp: 20 tablet, Rfl: 0 .  diclofenac Sodium (VOLTAREN) 1 % GEL, Apply 4 g topically 4 (four) times daily., Disp: 100 g, Rfl: 0 .  docusate sodium (COLACE) 100 MG capsule, Take 100 mg by mouth daily., Disp: , Rfl:  .  glucose blood (ONETOUCH ULTRA) test strip, USE WITH METER TO CHECK BLOOD  SUGAR TWICE DAILY BEFORE  BREAKFAST AND BEFORE DINNER, Disp: 200 strip, Rfl: 2 .  hydrALAZINE (APRESOLINE) 100 MG tablet, TAKE 1 AND 1/2 TABLETS BY MOUTH  TWICE DAILY, Disp: 270 tablet, Rfl: 3 .  hydrochlorothiazide (HYDRODIURIL) 25 MG tablet, Take 1 tablet (25 mg total) by mouth daily. Please keep scheduled appointment for future refills. Thank you., Disp: 90 tablet, Rfl: 0 .  HYDROcodone bit-homatropine (HYCODAN) 5-1.5 MG/5ML syrup, Take 5 mLs by mouth every 6 (six) hours as needed for cough., Disp: 240 mL, Rfl: 0 .  levothyroxine (SYNTHROID) 88 MCG tablet, TAKE 1 TABLET BY MOUTH DAILY  BEFORE BREAKFAST, Disp: 100 tablet, Rfl: 2 .  MAGNESIUM PO, Take 375 mg by mouth daily., Disp: , Rfl:  .  Melatonin 10 MG CAPS, Take 10 mg by mouth at bedtime., Disp: , Rfl:  .  metFORMIN (GLUCOPHAGE-XR) 500 MG 24 hr tablet, Take 1 tablet (500 mg total) by mouth 2 (two) times daily., Disp: 180 tablet, Rfl: 0 .  naproxen sodium (ALEVE) 220 MG tablet, Take 220 mg by mouth daily as needed., Disp: , Rfl:  .  pravastatin (PRAVACHOL) 20 MG tablet, TAKE 1 TABLET BY MOUTH EVERY DAY IN THE EVENING (Patient not taking: Reported on 10/13/2023), Disp: 90 tablet, Rfl: 3 .  spironolactone (ALDACTONE) 25 MG tablet, TAKE 1 TABLET (25 MG TOTAL) BY MOUTH DAILY., Disp: 90 tablet, Rfl: 1 .  Turmeric 500 MG CAPS, Take 1 tablet by mouth as needed (Takes occassionally)., Disp: , Rfl:    Allergies  Allergen Reactions  . Amlodipine     headache  . Aspartame And Phenylalanine Other (See Comments)    Causes migraines  . Bidil [Isosorb Dinitrate-Hydralazine]     headache  .  Elavil [Amitriptyline] Other (See Comments)    Loss of balance, hallucinations  . Erythromycin     Ask patient to specify type/severity/reaction. Not indicated on medical history form dated 11/04/10.  Marland Kitchen Fentanyl     Causes panic attacks  . Ibuprofen     Ask patient to specify type/severity/reaction. Not noted on medical history form dated 11/04/10.  Marland Kitchen Lisinopril Cough  . Metoprolol Other (See Comments)  . Phenylalanine Other (See Comments)    Causes migraine  . Prilosec [Omeprazole] Nausea And Vomiting  . Rosuvastatin Other (See Comments)  . Tramadol Nausea And Vomiting  . Valsartan Cough  . Verapamil Nausea And Vomiting  . Zantac [Ranitidine Hcl]       The patient states she uses {contraceptive methods:5051} for birth control. No LMP recorded. Patient is postmenopausal.. {Dysmenorrhea-menorrhagia:21918}. Negative for: breast discharge, breast lump(s), breast pain and breast self exam. Associated symptoms include abnormal vaginal bleeding. Pertinent negatives include abnormal bleeding (hematology), anxiety, decreased libido, depression, difficulty falling sleep, dyspareunia, history of infertility, nocturia, sexual dysfunction, sleep disturbances, urinary incontinence, urinary urgency, vaginal discharge and vaginal itching. Diet regular.The patient states her exercise level is    . The patient's tobacco use is:  Social History   Tobacco Use  Smoking Status Never  Smokeless Tobacco Never  . She has been exposed to passive smoke. The patient's alcohol use is:  Social History   Substance and Sexual Activity  Alcohol Use Not Currently  . Additional information: Last pap ***, next one scheduled for ***.    Review of Systems  Constitutional: Negative.   HENT: Negative.    Eyes: Negative.   Respiratory: Negative.    Cardiovascular: Negative.   Gastrointestinal: Negative.   Endocrine: Negative.   Genitourinary: Negative.   Musculoskeletal: Negative.   Skin: Negative.    Allergic/Immunologic: Negative.   Neurological: Negative.   Hematological: Negative.   Psychiatric/Behavioral: Negative.      There were no vitals filed for this visit. There is no height or weight on file to calculate BMI.  Wt Readings from Last 3 Encounters:  10/13/23 182 lb (82.6 kg)  10/13/23 182 lb 3.2 oz (82.6 kg)  08/12/23 191 lb 1.6 oz (86.7 kg)     Objective:  Physical Exam      Assessment And Plan:     Encounter for annual health examination  Type 2 diabetes mellitus without complication, without long-term current use of insulin (HCC)  Mixed hyperlipidemia  Acquired hypothyroidism  Essential hypertension     No follow-ups on file. Patient was given opportunity to ask questions. Patient verbalized understanding of the plan and  was able to repeat key elements of the plan. All questions were answered to their satisfaction.   [name], FNP  I, [name], FNP, have reviewed all documentation for this visit. The documentation on 11/14/23 for the exam, diagnosis, procedures, and orders are all accurate and complete.

## 2023-11-15 ENCOUNTER — Encounter: Payer: Self-pay | Admitting: Nurse Practitioner

## 2023-11-15 ENCOUNTER — Ambulatory Visit: Payer: Medicare Other | Admitting: Nurse Practitioner

## 2023-11-15 VITALS — BP 140/70 | HR 78 | Temp 98.5°F | Ht 65.0 in | Wt 186.0 lb

## 2023-11-15 DIAGNOSIS — Z0000 Encounter for general adult medical examination without abnormal findings: Secondary | ICD-10-CM | POA: Diagnosis not present

## 2023-11-15 DIAGNOSIS — T466X5A Adverse effect of antihyperlipidemic and antiarteriosclerotic drugs, initial encounter: Secondary | ICD-10-CM

## 2023-11-15 DIAGNOSIS — R7303 Prediabetes: Secondary | ICD-10-CM

## 2023-11-15 DIAGNOSIS — E119 Type 2 diabetes mellitus without complications: Secondary | ICD-10-CM

## 2023-11-15 DIAGNOSIS — E039 Hypothyroidism, unspecified: Secondary | ICD-10-CM | POA: Diagnosis not present

## 2023-11-15 DIAGNOSIS — I700 Atherosclerosis of aorta: Secondary | ICD-10-CM

## 2023-11-15 DIAGNOSIS — E559 Vitamin D deficiency, unspecified: Secondary | ICD-10-CM | POA: Diagnosis not present

## 2023-11-15 DIAGNOSIS — I119 Hypertensive heart disease without heart failure: Secondary | ICD-10-CM | POA: Diagnosis not present

## 2023-11-15 DIAGNOSIS — E1159 Type 2 diabetes mellitus with other circulatory complications: Secondary | ICD-10-CM

## 2023-11-15 DIAGNOSIS — Z2821 Immunization not carried out because of patient refusal: Secondary | ICD-10-CM | POA: Diagnosis not present

## 2023-11-15 DIAGNOSIS — R3981 Functional urinary incontinence: Secondary | ICD-10-CM

## 2023-11-15 DIAGNOSIS — R053 Chronic cough: Secondary | ICD-10-CM | POA: Diagnosis not present

## 2023-11-15 DIAGNOSIS — E782 Mixed hyperlipidemia: Secondary | ICD-10-CM

## 2023-11-15 DIAGNOSIS — G720 Drug-induced myopathy: Secondary | ICD-10-CM | POA: Diagnosis not present

## 2023-11-15 DIAGNOSIS — I10 Essential (primary) hypertension: Secondary | ICD-10-CM

## 2023-11-15 MED ORDER — METFORMIN HCL ER 500 MG PO TB24: 500.0000 mg | ORAL_TABLET | Freq: Two times a day (BID) | ORAL | 1 refills | Status: DC

## 2023-11-15 NOTE — Assessment & Plan Note (Signed)

## 2023-11-15 NOTE — Assessment & Plan Note (Signed)
She is not taking her statin at this time, she was to start pravastatin but is not taking.

## 2023-11-15 NOTE — Assessment & Plan Note (Signed)
Stable, continue metformin

## 2023-11-15 NOTE — Assessment & Plan Note (Signed)

## 2023-11-15 NOTE — Assessment & Plan Note (Signed)
Will check vitamin D level and supplement as needed.    Also encouraged to spend 15 minutes in the sun daily.   

## 2023-11-15 NOTE — Assessment & Plan Note (Addendum)
Cholesterol levels are elevated, she is not taking her statin due to side effects. Her next appt with Cardiology is in January. I have discontinued her pravastatin since she is not taking.

## 2023-11-15 NOTE — Assessment & Plan Note (Signed)
Blood pressure is slightly elevated, repeat remains slightly elevated. Encouraged to focus on lifestyle modifications

## 2023-11-15 NOTE — Assessment & Plan Note (Signed)
HgbA1c is stable, she reports she has not had a HgbA1c greater than 6.4, I have not seen this in her chart as well, so I discontinued her diabetes modifier.

## 2023-11-15 NOTE — Assessment & Plan Note (Signed)
She is not interested in a referral to PT for pelvic rehab. I have advised her to be mindful in changing her incontinence pads regular to avoid infection

## 2023-11-15 NOTE — Assessment & Plan Note (Signed)
Thyroid levels are normal.  Continue current medications.  Will check thyroid levels today.

## 2023-11-15 NOTE — Patient Instructions (Addendum)
Health Maintenance  Topic Date Due   COVID-19 Vaccine (5 - 2023-24 season) 12/01/2023*   Yearly kidney function blood test for diabetes  10/12/2024   Yearly kidney health urinalysis for diabetes  10/12/2024   Medicare Annual Wellness Visit  10/12/2024   Mammogram  10/25/2025   Colon Cancer Screening  03/19/2026   DTaP/Tdap/Td vaccine (2 - Td or Tdap) 10/18/2029   Pneumonia Vaccine  Completed   Flu Shot  Completed   DEXA scan (bone density measurement)  Completed   Hepatitis C Screening  Completed   Zoster (Shingles) Vaccine  Completed   HPV Vaccine  Aged Out  *Topic was postponed. The date shown is not the original due date.   Sleep aids to research: Belsomra Silenor rozerem

## 2023-11-15 NOTE — Assessment & Plan Note (Signed)
She has been to pulmonology who recommended she see ENT however she declined

## 2023-11-30 ENCOUNTER — Other Ambulatory Visit: Payer: Self-pay | Admitting: Nurse Practitioner

## 2023-12-07 ENCOUNTER — Ambulatory Visit: Payer: Medicare Other | Admitting: Surgical

## 2023-12-07 ENCOUNTER — Encounter: Payer: Self-pay | Admitting: Orthopedic Surgery

## 2023-12-07 ENCOUNTER — Other Ambulatory Visit (INDEPENDENT_AMBULATORY_CARE_PROVIDER_SITE_OTHER): Payer: Medicare Other

## 2023-12-07 DIAGNOSIS — M1712 Unilateral primary osteoarthritis, left knee: Secondary | ICD-10-CM

## 2023-12-07 DIAGNOSIS — M1711 Unilateral primary osteoarthritis, right knee: Secondary | ICD-10-CM | POA: Diagnosis not present

## 2023-12-07 DIAGNOSIS — M170 Bilateral primary osteoarthritis of knee: Secondary | ICD-10-CM | POA: Diagnosis not present

## 2023-12-07 MED ORDER — BUPIVACAINE HCL 0.25 % IJ SOLN
4.0000 mL | INTRAMUSCULAR | Status: AC | PRN
  Administered 2023-12-07: 4 mL via INTRA_ARTICULAR

## 2023-12-07 MED ORDER — LIDOCAINE HCL 1 % IJ SOLN
5.0000 mL | INTRAMUSCULAR | Status: AC | PRN
  Administered 2023-12-07: 5 mL

## 2023-12-07 MED ORDER — METHYLPREDNISOLONE ACETATE 40 MG/ML IJ SUSP
40.0000 mg | INTRAMUSCULAR | Status: AC | PRN
  Administered 2023-12-07: 40 mg via INTRA_ARTICULAR

## 2023-12-07 NOTE — Progress Notes (Signed)
Office Visit Note   Patient: Deborah [name]           Date of Birth: 11-Dec-1951           MRN: [id number] Visit Date: 12/07/2023 Requested by: [name], FNP [address] [place],  [place] [address] PCP: [name], FNP  Subjective: Chief Complaint  Patient presents with   Right Knee - Pain   Left Knee - Pain    HPI: Deborah [name] is a 72 y.o. female who presents to the office reporting knee pain.  Has history of knee arthritis.  No new falls or injuries.  No fevers or chills.  Previous injections have provided good relief and they are here today to repeat injections.  Had previous gel injection earlier this year.  Cortisone helps more than gel injection.  Takes Tylenol, Aleve, topical Voltaren.  He is considering total knee replacement.  Does have history of diabetes with last A1c 6.0.  Also has a history of CAD and follows with cardiology..                ROS: All systems reviewed are negative as they relate to the chief complaint within the history of present illness.  Patient denies fevers or chills.  Assessment & Plan: Visit Diagnoses:  1. Unilateral primary osteoarthritis, left knee   2. Unilateral primary osteoarthritis, right knee     Plan: Patient is a 72 year old female who presents for evaluation of bilateral knee pain.  Has history of bilateral knee osteoarthritis.  Today's radiographs demonstrate severe degenerative changes of the medial and patellofemoral compartment of both knees with left knee bothering her more than the right.  We discussed options available to patient.  Cortisone injections help more than the gel injection so we will continue with these.  She is considering knee replacement and we had a discussion about what this would entail.  She has multiple family members that have had knee replacement.  She is someone on the fence that she would like to try injections now and see how these do.  Left knee aspirated 20 cc of  nonpurulent synovial fluid in both knees injected with cortisone today.  Follow-up as needed with next possible injection in about 3 months.  She understands that she cannot have knee replacement for 3 months after these injections.  Follow-Up Instructions: No follow-ups on file.   Orders:  Orders Placed This Encounter  Procedures   XR Knee 1-2 Views Right   XR KNEE 3 VIEW LEFT   No orders of the defined types were placed in this encounter.     Procedures: Large Joint Inj: bilateral knee on 12/07/2023 11:36 AM Indications: diagnostic evaluation, joint swelling and pain Details: 18 G 1.5 in needle, superolateral approach  Arthrogram: No  Medications (Right): 5 mL lidocaine 1 %; 4 mL bupivacaine 0.25 %; 40 mg methylPREDNISolone acetate 40 MG/ML Medications (Left): 5 mL lidocaine 1 %; 4 mL bupivacaine 0.25 %; 40 mg methylPREDNISolone acetate 40 MG/ML Aspirate (Left): 20 mL Outcome: tolerated well, no immediate complications Procedure, treatment alternatives, risks and benefits explained, specific risks discussed. Consent was given by the patient. Immediately prior to procedure a time out was called to verify the correct patient, procedure, equipment, support staff and site/side marked as required. Patient was prepped and draped in the usual sterile fashion.       Clinical Data: No additional findings.  Objective: Vital Signs: There were no vitals taken for this visit.  Physical  Exam:  Constitutional: Patient appears well-developed HEENT:  Head: Normocephalic Eyes:EOM are normal Neck: Normal range of motion Cardiovascular: Normal rate Pulmonary/chest: Effort normal Neurologic: Patient is alert Skin: Skin is warm Psychiatric: Patient has normal mood and affect  Ortho Exam: Ortho exam demonstrates knees without cellulitis or skin changes.  Effusion present in left knee, none in right knee.  No calf tenderness.  Negative Homans' sign.  No pain with hip range of motion.  Able  to perform straight leg raise with both lower extremities.  Lower extremities warm and well-perfused. TTP over medial joint line primarily over both knees L>R.  No significant lateral joint line TTP  Specialty Comments:  No specialty comments available.  Imaging: No results found.   PMFS History: Patient Active Problem List   Diagnosis Date Noted   Encounter for annual health examination 11/15/2023   Hypertensive heart disease without heart failure 11/15/2023   Functional urinary incontinence 11/15/2023   Persistent cough 11/15/2023   Statin myopathy 11/15/2023   Atherosclerosis of aorta (HCC) 10/13/2023   COVID-19 vaccination declined 06/14/2023   BMI 30.0-30.9,adult 01/20/2023   Obesity, Beginning BMI 35.02 01/20/2023   Essential hypertension 07/05/2022   Type 2 diabetes mellitus without complication, without long-term current use of insulin (HCC) 07/05/2022   Mixed hyperlipidemia 03/04/2022   Insomnia 04/30/2019   Seasonal allergies 03/27/2019   Prediabetes 01/11/2019   Hypothyroidism 01/11/2019   Vitamin D deficiency 01/11/2019   Upper airway cough syndrome 01/11/2019   Fatigue 01/11/2019   Menopause 06/08/2011   Past Medical History:  Diagnosis Date   Back pain    Death of child 51   Due to cord strangulation.   Diabetes mellitus without complication (HCC)    Hyperlipidemia    Hypertension    Hypothyroidism    Joint pain    Nonobstructive atherosclerosis of coronary artery    Osteoarthritis    Other fatigue    Shortness of breath on exertion    Thyroid condition     Family History  Problem Relation Age of Onset   COPD Mother    Diabetes Mother    Hypertension Mother    Obesity Mother    Heart disease Father        died of sudden MI   Hypertension Father    Glaucoma Father     Past Surgical History:  Procedure Laterality Date   APPENDECTOMY  1974   CARDIAC CATHETERIZATION     two, dates not provided.   CHOLECYSTECTOMY     confirm date with  patient, was it 74?   LAPAROSCOPIC TUBAL LIGATION  1987   THYROIDECTOMY     Social History   Occupational History   Occupation: retired    Associate Professor: [employer]  Tobacco Use   Smoking status: Never   Smokeless tobacco: Never  Vaping Use   Vaping status: Never Used  Substance and Sexual Activity   Alcohol use: Not Currently   Drug use: No   Sexual activity: Not Currently

## 2023-12-12 ENCOUNTER — Encounter: Payer: Self-pay | Admitting: Nurse Practitioner

## 2023-12-15 ENCOUNTER — Other Ambulatory Visit: Payer: Self-pay | Admitting: Nurse Practitioner

## 2023-12-15 ENCOUNTER — Encounter: Payer: Self-pay | Admitting: Nurse Practitioner

## 2023-12-15 DIAGNOSIS — R058 Other specified cough: Secondary | ICD-10-CM

## 2023-12-15 MED ORDER — HYDROCODONE BIT-HOMATROP MBR 5-1.5 MG/5ML PO SOLN: 5.0000 mL | Freq: Four times a day (QID) | ORAL | 0 refills | Status: DC | PRN

## 2023-12-23 ENCOUNTER — Other Ambulatory Visit: Payer: Self-pay | Admitting: Nurse Practitioner

## 2023-12-23 ENCOUNTER — Ambulatory Visit (HOSPITAL_BASED_OUTPATIENT_CLINIC_OR_DEPARTMENT_OTHER): Payer: Medicare Other | Admitting: Cardiovascular Disease

## 2023-12-23 ENCOUNTER — Encounter (HOSPITAL_BASED_OUTPATIENT_CLINIC_OR_DEPARTMENT_OTHER): Payer: Self-pay | Admitting: Cardiovascular Disease

## 2023-12-23 VITALS — BP 146/80 | HR 80 | Ht 65.0 in | Wt 188.1 lb

## 2023-12-23 DIAGNOSIS — I700 Atherosclerosis of aorta: Secondary | ICD-10-CM | POA: Diagnosis not present

## 2023-12-23 DIAGNOSIS — I10 Essential (primary) hypertension: Secondary | ICD-10-CM | POA: Diagnosis not present

## 2023-12-23 DIAGNOSIS — E782 Mixed hyperlipidemia: Secondary | ICD-10-CM

## 2023-12-23 DIAGNOSIS — R7303 Prediabetes: Secondary | ICD-10-CM

## 2023-12-23 MED ORDER — SPIRONOLACTONE 25 MG PO TABS: 37.5000 mg | ORAL_TABLET | Freq: Every day | ORAL | 3 refills | Status: DC

## 2023-12-23 NOTE — Progress Notes (Signed)
 Cardiology Office Note:  .   Date:  12/23/2023  ID:  Deborah [name], DOB 06-14-1951, MRN [id number] PCP: Georgina Speaks, FNP  [facility] Providers Cardiologist:  None    History of Present Illness: .    Deborah [name] is a 73 y.o. female with hypertension, hypothyroidism, and diabetes here for follow-up.  She was initially seen 12/2019 for hypertension.  Deborah [name] reports having high blood pressure for years.  When her daughter was born she had a hypertensive crisis and struggled with BP since that time.  It has never been very well-controlled.    She saw Speaks Georgina, FNP, on 09/2019 and her BP was 170/98.  She has a history of intolerance to many medications so she was referred to cardiology for further management.  She reports having been on HCTZ for many years.   She notices that her BP is worse when she is feeling stressed.  Prior to COVID-19 she was exercising regularly.  Hydrochlorothiazide  was reduced and she was started on spironolactone .  Rosuvastatin  was increased to daily.  At her visit 01/2020 hydralazine  was increased.    Deborah [name] did not see cardiology again until 12/2022 when she saw Deborah Alberts, NP.  At that time she was feeling well but had been off of her hydralazine  and spironolactone  for the preceding 3 weeks.  At that time spironolactone  was discontinued and HCTZ was continued.  Her blood pressure was consistently elevated so spironolactone  was restarted and hydralazine  was increased.  She reported atypical chest pain and was referred for coronary CTA 06/2023 that revealed mild nonobstructive disease and a calcium  score of 256, which was 87 percentile.  She had an echo that revealed normal LVEF and grade 1 diastolic dysfunction.  She had moderate tricuspid regurgitation.   Deborah [name] presents with chronic sleep disturbances and a new complaint of persistent left arm and shoulder pain. She reports a history of poor tolerance to multiple statin  medications, including rosuvastatin , pravastatin , Lipitor, and Zetia , all of which have caused significant muscle cramps and aches. She has also tried multiple medications for sleep, including trazodone  and amitriptyline , but discontinued due to adverse effects including loss of balance and falls. Currently, she is using over-the-counter sleep aids, such as melatonin, but reports waking up with headaches.  Deborah [name] also reports a recent increase in nocturia, which she attributes to her current regimen of spironolactone  and hydrochlorothiazide . She expresses frustration with frequent urination and its impact on her sleep.  Her left arm and shoulder pain is a daily occurrence and is exacerbated by movement. She reports a remote fall in a grocery store after starting amitriptyline . She has not sought orthopedic consultation for this issue yet.  Deborah [name] remains active despite her knee pain, working in a clinic once or twice a week. She denies any chest pain, pressure, or significant dyspnea. She also reports a two-week lapse in her thyroid  medication due to issues with her pharmacy.  Her blood pressure control has been suboptimal, with home readings often in the 130s systolic and 80s diastolic. She has been taking hydralazine  150mg  twice daily.     Prior antihypertensives: HCTZ for years BidIl- head hurt Valsartan- cough Norvasc- migraines Metoprolol  Lisinopril   ROS:  As per HPI  Studies Reviewed: SABRA   EKG Interpretation Date/Time:  Friday December 23 2023 08:48:30 EST Ventricular Rate:  80 PR Interval:  148 QRS Duration:  76 QT Interval:  404 QTC Calculation: 465 R Axis:   19  Text Interpretation: Normal sinus rhythm Nonspecific T wave abnormality When compared with ECG of 01-Jul-2023 08:07, No significant change was found Confirmed by Raford Riggs ([id number]) on 12/23/2023 8:58:27 AM   Echo 07/15/23:  1. Left ventricular ejection fraction, by estimation, is 60 to 65%. The  left  ventricle has normal function. The left ventricle has no regional  wall motion abnormalities. Left ventricular diastolic parameters are  consistent with Grade I diastolic  dysfunction (impaired relaxation).   2. Right ventricular systolic function is normal. The right ventricular  size is normal. There is normal pulmonary artery systolic pressure.   3. The mitral valve is normal in structure. Mild mitral valve  regurgitation. No evidence of mitral stenosis.   4. The aortic valve is normal in structure. Aortic valve regurgitation is  trivial. No aortic stenosis is present.   5. Pulmonic valve regurgitation is moderate.   6. The inferior vena cava is normal in size with greater than 50%  respiratory variability, suggesting right atrial pressure of 3 mmHg.   Coronary CT-A 07/11/23: IMPRESSION: 1. Coronary calcium  score of 256. This was 87th percentile for age-, sex, and race-matched controls.   2. Total plaque volume 337 mm3 which is 60th percentile for age- and sex-matched controls (calcified plaque 27 mm3; non-calcified plaque 310 mm3). TPV is severe/extensive.   3. Normal coronary origin with left dominance.   4. Minimal mixed density plaque in the LAD (<25%).   5. Mild calcified plaque (25-49%) in D1.   6. Minimal mixed density plaque (<25%) in the LCX.   7. Minimal non-calcified plaque (<25%) in the non-dominant RCA.   RECOMMENDATIONS: 1. Mild non-obstructive CAD (25-49%). Consider non-atherosclerotic causes of chest pain. Consider preventive therapy and risk factor modification.  Risk Assessment/Calculations:    HYPERTENSION CONTROL Vitals:   12/23/23 0843 12/23/23 1028  BP: (!) 140/92 (!) 146/80    The patient's blood pressure is elevated above target today.  In order to address the patient's elevated BP: A current anti-hypertensive medication was adjusted today.    Physical Exam:   VS:  BP (!) 146/80   Pulse 80   Ht 5' 5 (1.651 m)   Wt 188 lb 1.6 oz (85.3  kg)   SpO2 99%   BMI 31.30 kg/m  , BMI Body mass index is 31.3 kg/m. GENERAL:  Well appearing HEENT: Pupils equal round and reactive, fundi not visualized, oral mucosa unremarkable NECK:  No jugular venous distention, waveform within normal limits, carotid upstroke brisk and symmetric, no bruits, no thyromegaly LUNGS:  Clear to auscultation bilaterally HEART:  RRR.  PMI not displaced or sustained,S1 and S2 within normal limits, no S3, no S4, no clicks, no rubs, mp murmurs ABD:  Flat, positive bowel sounds normal in frequency in pitch, no bruits, no rebound, no guarding, no midline pulsatile mass, no hepatomegaly, no splenomegaly EXT:  2 plus pulses throughout, no edema, no cyanosis no clubbing SKIN:  No rashes no nodules NEURO:  Cranial nerves II through XII grossly intact, motor grossly intact throughout PSYCH:  Cognitively intact, oriented to person place and time   ASSESSMENT AND PLAN: .    # Hyperlipidemia Patient has tried multiple statins (rosuvastatin , pravastatin , Lipitor) and ezetimibe  (Zetia ), all of which have caused intolerable muscle cramps and aches. Discussed the option of PCSK9 inhibitors (Repatha , Praluent) and inclisiran (Leqvio). Patient prefers Leqvio due to less frequent dosing. -Order lipid panel and comprehensive metabolic panel in 1 week. -Refer to Dr. Mona for initiation of Leqvio.  #  Hypertension Blood pressure elevated today, patient reports home readings in the 130s/80s. Patient currently on hydralazine  150mg  BID and spironolactone  25mg  daily. Patient reports frequent nocturia with current regimen. -Increase spironolactone  to 37.5mg  daily. -Check comprehensive metabolic panel in 1 week to assess potassium levels.  # Insomnia Patient has tried trazodone  and amitriptyline , both of which caused intolerable side effects. Currently using over-the-counter sleep aids (melatonin, Z-drug unspecified) with limited success and morning headaches. -No changes made to  current regimen at this time.  # Shoulder Pain Chronic left shoulder pain, worse with movement. Suspected rotator cuff injury. -Refer to [facility] for evaluation.  Follow-up in 4 months after initiation of Leqvio and adjustment of spironolactone  dose.      Signed, Annabella Scarce, MD

## 2023-12-23 NOTE — Patient Instructions (Addendum)
 Medication Instructions:  We are starting the process of getting Leqvio approved  Increase Spironolactone  to 37.5 mg daily   *If you need a refill on your cardiac medications before your next appointment, please call your pharmacy*   Lab Work: Your physician recommends that you return for a FASTING lipid profile and cmp in 1 week. No appointment needed. Lab is located on the 3rd floor   Your physician recommends that you return for a FASTING lipid profile and cmp in 3 months after starting Leqvio   If you have labs (blood work) drawn today and your tests are completely normal, you will receive your results only by: MyChart Message (if you have MyChart) OR A paper copy in the mail If you have any lab test that is abnormal or we need to change your treatment, we will call you to review the results.   Testing/Procedures: None ordered    Follow-Up: At [facility], you and your health needs are our priority.  As part of our continuing mission to provide you with exceptional heart care, we have created designated Provider Care Teams.  These Care Teams include your primary Cardiologist (physician) and Advanced Practice Providers (APPs -  Physician Assistants and Nurse Practitioners) who all work together to provide you with the care you need, when you need it.  We recommend signing up for the patient portal called MyChart.  Sign up information is provided on this After Visit Summary.  MyChart is used to connect with patients for Virtual Visits (Telemedicine).  Patients are able to view lab/test results, encounter notes, upcoming appointments, etc.  Non-urgent messages can be sent to your provider as well.   To learn more about what you can do with MyChart, go to [email or web address].    Your next appointment:   4 month(s)  Provider:   Annabella Scarce, MD  Other Instructions

## 2023-12-30 ENCOUNTER — Encounter (HOSPITAL_BASED_OUTPATIENT_CLINIC_OR_DEPARTMENT_OTHER): Payer: Self-pay | Admitting: Cardiovascular Disease

## 2023-12-30 DIAGNOSIS — E782 Mixed hyperlipidemia: Secondary | ICD-10-CM | POA: Diagnosis not present

## 2023-12-31 LAB — COMPREHENSIVE METABOLIC PANEL
ALT: 13 [IU]/L (ref 0–32)
AST: 13 [IU]/L (ref 0–40)
Albumin: 4.4 g/dL (ref 3.8–4.8)
Alkaline Phosphatase: 91 [IU]/L (ref 44–121)
BUN/Creatinine Ratio: 23 (ref 12–28)
BUN: 16 mg/dL (ref 8–27)
Bilirubin Total: 0.4 mg/dL (ref 0.0–1.2)
CO2: 24 mmol/L (ref 20–29)
Calcium: 10 mg/dL (ref 8.7–10.3)
Chloride: 95 mmol/L — ABNORMAL LOW (ref 96–106)
Creatinine, Ser: 0.69 mg/dL (ref 0.57–1.00)
Globulin, Total: 2.5 g/dL (ref 1.5–4.5)
Glucose: 94 mg/dL (ref 70–99)
Potassium: 4.6 mmol/L (ref 3.5–5.2)
Sodium: 135 mmol/L (ref 134–144)
Total Protein: 6.9 g/dL (ref 6.0–8.5)
eGFR: 93 mL/min/{1.73_m2} (ref >59)

## 2023-12-31 LAB — LIPID PANEL
Chol/HDL Ratio: 3.9 {ratio} (ref 0.0–4.4)
Cholesterol, Total: 256 mg/dL — ABNORMAL HIGH (ref 100–199)
HDL: 66 mg/dL (ref >39)
LDL Chol Calc (NIH): 177 mg/dL — ABNORMAL HIGH (ref 0–99)
Triglycerides: 80 mg/dL (ref 0–149)
VLDL Cholesterol Cal: 13 mg/dL (ref 5–40)

## 2024-01-05 NOTE — Telephone Encounter (Signed)
I don't see that a signed form has been faxed in.  Do you know if they got her signature to start this?

## 2024-01-09 ENCOUNTER — Encounter (HOSPITAL_BASED_OUTPATIENT_CLINIC_OR_DEPARTMENT_OTHER): Payer: Self-pay | Admitting: Cardiovascular Disease

## 2024-01-16 ENCOUNTER — Other Ambulatory Visit: Payer: Self-pay | Admitting: Pharmacist

## 2024-01-16 ENCOUNTER — Telehealth: Payer: Self-pay | Admitting: Pharmacist

## 2024-01-16 NOTE — Telephone Encounter (Signed)
Vasishali, Please enter the treatment plan and I will begin the auth process.  Thanks Selena Batten

## 2024-01-16 NOTE — Telephone Encounter (Signed)
Received notification from [facility] start from. Document uploaded in media.

## 2024-01-18 ENCOUNTER — Telehealth: Payer: Self-pay | Admitting: Pharmacy Technician

## 2024-01-18 NOTE — Telephone Encounter (Signed)
Deborah [name],  Patient has been denied due to she have not failed or tried preferred medications. Praluent Repatha  We will d/c the treatment plan.  @[email or web address] , please d/c the treatment plan.  Auth Submission: DENIED Site of care: Site of care: [facility] Payer: [employer] Medication & CPT/J Code(s) submitted: Leqvio (Inclisiran) [phone] Route of submission (phone, fax, portal):  Phone # Fax # Auth type: Buy/Bill PB Units/visits requested:  Reference number:  Approval from:  to

## 2024-01-23 NOTE — Telephone Encounter (Signed)
Phillips Hay, PharmD had sent MyChart msg to patient about Deborah [name] denial and other preferred therapy on Jan 20,2025  Call to f/u, N/A, LVM to schedule appointment with lipid clinic to discuss other options.

## 2024-02-10 ENCOUNTER — Encounter: Payer: Self-pay | Admitting: Nurse Practitioner

## 2024-02-14 ENCOUNTER — Other Ambulatory Visit: Payer: Self-pay | Admitting: Nurse Practitioner

## 2024-02-14 DIAGNOSIS — R058 Other specified cough: Secondary | ICD-10-CM

## 2024-02-14 MED ORDER — HYDROCODONE BIT-HOMATROP MBR 5-1.5 MG/5ML PO SOLN: 5.0000 mL | Freq: Four times a day (QID) | ORAL | 0 refills | Status: DC | PRN

## 2024-02-26 ENCOUNTER — Other Ambulatory Visit: Payer: Self-pay | Admitting: Nurse Practitioner

## 2024-02-26 DIAGNOSIS — E039 Hypothyroidism, unspecified: Secondary | ICD-10-CM

## 2024-03-07 ENCOUNTER — Ambulatory Visit: Payer: Medicare Other | Admitting: Orthopedic Surgery

## 2024-03-07 DIAGNOSIS — M170 Bilateral primary osteoarthritis of knee: Secondary | ICD-10-CM

## 2024-03-07 DIAGNOSIS — M1711 Unilateral primary osteoarthritis, right knee: Secondary | ICD-10-CM

## 2024-03-07 DIAGNOSIS — M1712 Unilateral primary osteoarthritis, left knee: Secondary | ICD-10-CM

## 2024-03-08 NOTE — Progress Notes (Unsigned)
 Office Visit Note   Patient: Deborah [name]           Date of Birth: 1951/02/03           MRN: [id number] Visit Date: 03/07/2024 Requested by: [name], FNP [address] [place],  [place] [address] PCP: [name], FNP  Subjective: Chief Complaint  Patient presents with   Right Knee - Pain   Left Knee - Pain    HPI: Deborah [name] is a 73 y.o. female who presents to the office reporting bilateral knee pain.  Patient has had good results with injections in the past.  She would like to have injections performed today.  Blood glucose did not go up very much with prior injections.  Hemoglobin A1c currently less than 6.0.  Continues to have weightbearing pain in the knees.  Has known history of arthritis..                ROS: All systems reviewed are negative as they relate to the chief complaint within the history of present illness.  Patient denies fevers or chills.  Assessment & Plan: Visit Diagnoses:  1. Unilateral primary osteoarthritis, left knee   2. Unilateral primary osteoarthritis, right knee     Plan: Impression is bilateral knee arthritis.  Bilateral knee injections performed today.  Will see how she does with those.  Continue with nonweightbearing for strengthening exercises and follow-up as needed.  Over-the-counter medication as necessary to alleviate inflammatory symptoms  Follow-Up Instructions: No follow-ups on file.   Orders:  No orders of the defined types were placed in this encounter.  No orders of the defined types were placed in this encounter.     Procedures: Large Joint Inj: bilateral knee on 03/07/2024 9:11 PM Indications: diagnostic evaluation, joint swelling and pain Details: 18 G 1.5 in needle, superolateral approach  Arthrogram: No  Medications (Right): 5 mL lidocaine 1 %; 4 mL bupivacaine 0.25 %; 40 mg methylPREDNISolone acetate 40 MG/ML Medications (Left): 5 mL lidocaine 1 %; 4 mL bupivacaine 0.25 %; 40 mg  methylPREDNISolone acetate 40 MG/ML Outcome: tolerated well, no immediate complications Procedure, treatment alternatives, risks and benefits explained, specific risks discussed. Consent was given by the patient. Immediately prior to procedure a time out was called to verify the correct patient, procedure, equipment, support staff and site/side marked as required. Patient was prepped and draped in the usual sterile fashion.       Clinical Data: No additional findings.  Objective: Vital Signs: There were no vitals taken for this visit.  Physical Exam:  Constitutional: Patient appears well-developed HEENT:  Head: Normocephalic Eyes:EOM are normal Neck: Normal range of motion Cardiovascular: Normal rate Pulmonary/chest: Effort normal Neurologic: Patient is alert Skin: Skin is warm Psychiatric: Patient has normal mood and affect  Ortho Exam: Ortho exam demonstrates slight flexion contractures bilaterally with intact extensor mechanism.  No groin pain with internal/external Tatian of the leg.  Pedal pulses palpable.  Ankle dorsiflexion intact.  Collateral and cruciate ligaments are stable.  Necko tenderness present in both knees.  Specialty Comments:  No specialty comments available.  Imaging: No results found.   PMFS History: Patient Active Problem List   Diagnosis Date Noted   Encounter for annual health examination 11/15/2023   Hypertensive heart disease without heart failure 11/15/2023   Functional urinary incontinence 11/15/2023   Persistent cough 11/15/2023   Statin myopathy 11/15/2023   Atherosclerosis of aorta (HCC) 10/13/2023   COVID-19 vaccination declined 06/14/2023  BMI 30.0-30.9,adult 01/20/2023   Obesity, Beginning BMI 35.02 01/20/2023   Essential hypertension 07/05/2022   Type 2 diabetes mellitus without complication, without long-term current use of insulin (HCC) 07/05/2022   Mixed hyperlipidemia 03/04/2022   Insomnia 04/30/2019   Seasonal allergies  03/27/2019   Prediabetes 01/11/2019   Hypothyroidism 01/11/2019   Vitamin D deficiency 01/11/2019   Upper airway cough syndrome 01/11/2019   Fatigue 01/11/2019   Menopause 06/08/2011   Past Medical History:  Diagnosis Date   Back pain    Death of child 27   Due to cord strangulation.   Diabetes mellitus without complication (HCC)    Hyperlipidemia    Hypertension    Hypothyroidism    Joint pain    Nonobstructive atherosclerosis of coronary artery    Osteoarthritis    Other fatigue    Shortness of breath on exertion    Thyroid condition     Family History  Problem Relation Age of Onset   COPD Mother    Diabetes Mother    Hypertension Mother    Obesity Mother    Heart disease Father        died of sudden MI   Hypertension Father    Glaucoma Father     Past Surgical History:  Procedure Laterality Date   APPENDECTOMY  1974   CARDIAC CATHETERIZATION     two, dates not provided.   CHOLECYSTECTOMY     confirm date with patient, was it 87?   LAPAROSCOPIC TUBAL LIGATION  1987   THYROIDECTOMY     Social History   Occupational History   Occupation: retired    Associate Professor: [employer]  Tobacco Use   Smoking status: Never   Smokeless tobacco: Never  Vaping Use   Vaping status: Never Used  Substance and Sexual Activity   Alcohol use: Not Currently   Drug use: No   Sexual activity: Not Currently

## 2024-03-09 ENCOUNTER — Encounter: Payer: Self-pay | Admitting: Orthopedic Surgery

## 2024-03-09 MED ORDER — METHYLPREDNISOLONE ACETATE 40 MG/ML IJ SUSP
40.0000 mg | INTRAMUSCULAR | Status: AC | PRN
  Administered 2024-03-07: 40 mg via INTRA_ARTICULAR

## 2024-03-09 MED ORDER — BUPIVACAINE HCL 0.25 % IJ SOLN
4.0000 mL | INTRAMUSCULAR | Status: AC | PRN
  Administered 2024-03-07: 4 mL via INTRA_ARTICULAR

## 2024-03-09 MED ORDER — LIDOCAINE HCL 1 % IJ SOLN
5.0000 mL | INTRAMUSCULAR | Status: AC | PRN
  Administered 2024-03-07: 5 mL

## 2024-03-12 ENCOUNTER — Encounter: Payer: Self-pay | Admitting: Nurse Practitioner

## 2024-03-13 NOTE — Progress Notes (Signed)
 Deborah [name], CMA,acting as a Neurosurgeon for Deborah Felts, FNP.,have documented all relevant documentation on the behalf of Deborah Felts, FNP,as directed by  Deborah Felts, FNP while in the presence of Deborah Felts, FNP.  Subjective:  Patient ID: Deborah [name] , female    DOB: 1951/08/21 , 73 y.o.   MRN: [id number]  Chief Complaint  Patient presents with   Diabetes    HPI  Patient presents today for a chol and dm follow up, Patient reports compliance with medication. Patient denies any chest pain, SOB, or headaches. Patient has no concerns today. She is inquiring about having a calcium risk screening and Lipoprotein. She has stopped going to healthy weight and wellness.      Past Medical History:  Diagnosis Date   Back pain    Death of child 16   Due to cord strangulation.   Diabetes mellitus without complication (HCC)    Hyperlipidemia    Hypertension    Hypothyroidism    Joint pain    Nonobstructive atherosclerosis of coronary artery    Osteoarthritis    Other fatigue    Shortness of breath on exertion    Thyroid condition      Family History  Problem Relation Age of Onset   COPD Mother    Diabetes Mother    Hypertension Mother    Obesity Mother    Heart disease Father        died of sudden MI   Hypertension Father    Glaucoma Father      Current Outpatient Medications:    acetaminophen (TYLENOL) 500 MG tablet, Take 1,000 mg by mouth at bedtime., Disp: , Rfl:    aspirin EC 81 MG tablet, Take 81 mg by mouth daily. Swallow whole., Disp: , Rfl:    Blood Glucose Monitoring Suppl (ACURA BLOOD GLUCOSE METER) w/Device KIT, Check blood sugars twice daily. Please provide patient with meter and supplies that is covered by pt ins., Disp: 1 kit, Rfl: 0   Blood Glucose Monitoring Suppl (ONE TOUCH ULTRA MINI) w/Device KIT, Use to check blood sugars 3 times a day. Dx code:e11.65, Disp: 1 kit, Rfl: 0   cholecalciferol (VITAMIN D) 1000 units tablet, Take 2,000 Units by  mouth daily., Disp: , Rfl:    COLLAGEN PO, Take 1 tablet by mouth daily., Disp: , Rfl:    cyclobenzaprine (FLEXERIL) 10 MG tablet, Take 1 tablet (10 mg total) by mouth 2 (two) times daily as needed for muscle spasms., Disp: 20 tablet, Rfl: 0   diclofenac Sodium (VOLTAREN) 1 % GEL, Apply 4 g topically 4 (four) times daily., Disp: 100 g, Rfl: 0   docusate sodium (COLACE) 100 MG capsule, Take 100 mg by mouth daily., Disp: , Rfl:    glucose blood (ONETOUCH ULTRA) test strip, USE WITH METER TO CHECK BLOOD  SUGAR TWICE DAILY BEFORE  BREAKFAST AND BEFORE DINNER, Disp: 200 strip, Rfl: 2   hydrALAZINE (APRESOLINE) 100 MG tablet, TAKE 1 AND 1/2 TABLETS BY MOUTH  TWICE DAILY, Disp: 270 tablet, Rfl: 2   hydrochlorothiazide (HYDRODIURIL) 25 MG tablet, TAKE 1 TABLET BY MOUTH DAILY, Disp: 90 tablet, Rfl: 3   HYDROcodone bit-homatropine (HYCODAN) 5-1.5 MG/5ML syrup, Take 5 mLs by mouth every 6 (six) hours as needed for cough., Disp: 240 mL, Rfl: 0   levothyroxine (SYNTHROID) 88 MCG tablet, TAKE 1 TABLET BY MOUTH DAILY  BEFORE BREAKFAST, Disp: 100 tablet, Rfl: 2   MAGNESIUM PO, Take 375 mg by mouth daily., Disp: , Rfl:  Melatonin 10 MG CAPS, Take 10 mg by mouth at bedtime., Disp: , Rfl:    metFORMIN (GLUCOPHAGE-XR) 500 MG 24 hr tablet, Take 1 tablet (500 mg total) by mouth 2 (two) times daily., Disp: 180 tablet, Rfl: 1   naproxen sodium (ALEVE) 220 MG tablet, Take 220 mg by mouth daily as needed., Disp: , Rfl:    spironolactone (ALDACTONE) 25 MG tablet, Take 1.5 tablets (37.5 mg total) by mouth daily., Disp: 120 tablet, Rfl: 3   Turmeric 500 MG CAPS, Take 1 tablet by mouth as needed (Takes occassionally)., Disp: , Rfl:    Allergies  Allergen Reactions   Amlodipine     headache   Aspartame And Phenylalanine Other (See Comments)    Causes migraines   Bidil [Isosorb Dinitrate-Hydralazine]     headache   Elavil [Amitriptyline] Other (See Comments)    Loss of balance, hallucinations   Erythromycin     Ask  patient to specify type/severity/reaction. Not indicated on medical history form dated 11/04/10.   Fentanyl     Causes panic attacks   Ibuprofen     Ask patient to specify type/severity/reaction. Not noted on medical history form dated 11/04/10.   Lisinopril Cough   Metoprolol Other (See Comments)   Phenylalanine Other (See Comments)    Causes migraine   Prilosec [Omeprazole] Nausea And Vomiting   Rosuvastatin Other (See Comments)   Tramadol Nausea And Vomiting   Valsartan Cough   Verapamil Nausea And Vomiting   Zantac [Ranitidine Hcl]    Zetia [Ezetimibe]     myalgias     Review of Systems  Constitutional: Negative.  Negative for fatigue.  HENT: Negative.    Eyes: Negative.   Respiratory: Negative.    Cardiovascular: Negative.  Negative for chest pain.  Gastrointestinal: Negative.   Endocrine: Negative for polydipsia, polyphagia and polyuria.  Neurological: Negative.  Negative for dizziness and headaches.  Psychiatric/Behavioral: Negative.       Today's Vitals   03/14/24 0835  BP: 130/80  Pulse: 78  Temp: 98.9 F (37.2 C)  TempSrc: Oral  Weight: 187 lb 3.2 oz (84.9 kg)  Height: 5\' 5"  (1.651 m)  PainSc: 0-No pain   Body mass index is 31.15 kg/m.  Wt Readings from Last 3 Encounters:  03/14/24 187 lb 3.2 oz (84.9 kg)  12/23/23 188 lb 1.6 oz (85.3 kg)  11/15/23 186 lb (84.4 kg)     Objective:  Physical Exam Vitals and nursing note reviewed.  Constitutional:      General: She is not in acute distress.    Appearance: Normal appearance.  Cardiovascular:     Rate and Rhythm: Normal rate and regular rhythm.     Pulses: Normal pulses.     Heart sounds: Normal heart sounds. No murmur heard. Pulmonary:     Effort: Pulmonary effort is normal. No respiratory distress.     Breath sounds: Normal breath sounds. No wheezing.  Skin:    Capillary Refill: Capillary refill takes less than 2 seconds.  Neurological:     General: No focal deficit present.     Mental Status:  She is alert and oriented to person, place, and time.     Cranial Nerves: No cranial nerve deficit.     Motor: No weakness.  Psychiatric:        Mood and Affect: Mood normal.        Behavior: Behavior normal.        Thought Content: Thought content normal.  Judgment: Judgment normal.         Assessment And Plan:  Type 2 diabetes mellitus with hyperlipidemia (HCC) Assessment & Plan: Stable, continue metformin.  Will check HgbA1c  Orders: -     Hemoglobin A1c  Acquired hypothyroidism Assessment & Plan: Thyroid levels are normal.  Continue current medications.  Will check thyroid levels today.   COVID-19 vaccination declined Assessment & Plan: Declines covid 19 vaccine. Discussed risk of covid 72 and if she changes her mind about the vaccine to call the office. Education has been provided regarding the importance of this vaccine but patient still declined. Advised may receive this vaccine at local pharmacy or Health Dept.or vaccine clinic. Aware to provide a copy of the vaccination record if obtained from local pharmacy or Health Dept.  Encouraged to take multivitamin, vitamin d, vitamin c and zinc to increase immune system. Aware can call office if would like to have vaccine here at office. Verbalized acceptance and understanding.      Return for controlled DM check 4 months.  Patient was given opportunity to ask questions. Patient verbalized understanding of the plan and was able to repeat key elements of the plan. All questions were answered to their satisfaction.    Deborah Sparrow, FNP, have reviewed all documentation for this visit. The documentation on 03/14/24 for the exam, diagnosis, procedures, and orders are all accurate and complete.   IF YOU HAVE BEEN REFERRED TO A SPECIALIST, IT MAY TAKE 1-2 WEEKS TO SCHEDULE/PROCESS THE REFERRAL. IF YOU HAVE NOT HEARD FROM US/SPECIALIST IN TWO WEEKS, PLEASE GIVE [place] A CALL AT [phone].

## 2024-03-14 ENCOUNTER — Ambulatory Visit (INDEPENDENT_AMBULATORY_CARE_PROVIDER_SITE_OTHER): Payer: Medicare Other | Admitting: Nurse Practitioner

## 2024-03-14 ENCOUNTER — Encounter: Payer: Self-pay | Admitting: Nurse Practitioner

## 2024-03-14 VITALS — BP 130/80 | HR 78 | Temp 98.9°F | Ht 65.0 in | Wt 187.2 lb

## 2024-03-14 DIAGNOSIS — E782 Mixed hyperlipidemia: Secondary | ICD-10-CM

## 2024-03-14 DIAGNOSIS — E039 Hypothyroidism, unspecified: Secondary | ICD-10-CM

## 2024-03-14 DIAGNOSIS — Z2821 Immunization not carried out because of patient refusal: Secondary | ICD-10-CM

## 2024-03-14 DIAGNOSIS — E1169 Type 2 diabetes mellitus with other specified complication: Secondary | ICD-10-CM

## 2024-03-14 DIAGNOSIS — E119 Type 2 diabetes mellitus without complications: Secondary | ICD-10-CM | POA: Diagnosis not present

## 2024-03-14 DIAGNOSIS — E785 Hyperlipidemia, unspecified: Secondary | ICD-10-CM | POA: Diagnosis not present

## 2024-03-14 LAB — HEMOGLOBIN A1C
Est. average glucose Bld gHb Est-mCnc: 128 mg/dL
Hgb A1c MFr Bld: 6.1 % — ABNORMAL HIGH (ref 4.8–5.6)

## 2024-03-25 ENCOUNTER — Encounter: Payer: Self-pay | Admitting: Nurse Practitioner

## 2024-03-25 NOTE — Assessment & Plan Note (Signed)

## 2024-03-25 NOTE — Assessment & Plan Note (Signed)
 Stable, continue metformin.  Will check HgbA1c

## 2024-03-25 NOTE — Assessment & Plan Note (Signed)
 Thyroid levels are normal.  Continue current medications.  Will check thyroid levels today.

## 2024-04-04 ENCOUNTER — Encounter: Payer: Self-pay | Admitting: Nurse Practitioner

## 2024-04-04 ENCOUNTER — Other Ambulatory Visit: Payer: Self-pay | Admitting: Nurse Practitioner

## 2024-04-04 DIAGNOSIS — E039 Hypothyroidism, unspecified: Secondary | ICD-10-CM

## 2024-04-04 MED ORDER — LEVOTHYROXINE SODIUM 88 MCG PO TABS
88.0000 ug | ORAL_TABLET | Freq: Every day | ORAL | 1 refills | Status: DC
Start: 2024-04-04 — End: 2024-10-15

## 2024-04-04 NOTE — Telephone Encounter (Signed)
 Copied from CRM [id number]. Topic: Clinical - Medication Refill >> Apr 04, 2024  8:30 AM Baldomero Bone wrote: Most Recent Primary Care Visit:  Provider: Susanna Epley  Department: [facility]  Visit Type: OFFICE VISIT  Date: 03/14/2024  Medication: levothyroxine (SYNTHROID) 88 MCG tablet  Has the patient contacted their pharmacy? Yes (Agent: If no, request that the patient contact the pharmacy for the refill. If patient does not wish to contact the pharmacy document the reason why and proceed with request.) (Agent: If yes, when and what did the pharmacy advise?)  Is this the correct pharmacy for this prescription? Yes If no, delete pharmacy and type the correct one.  This is the patient's preferred pharmacy:  [facility] [address], [place] - [address] [address] Phone: [phone] Fax: [phone]  Has the prescription been filled recently? No  Is the patient out of the medication? Yes  Has the patient been seen for an appointment in the last year OR does the patient have an upcoming appointment? Yes  Can we respond through MyChart? Yes  Agent: Please be advised that Rx refills may take up to 3 business days. We ask that you follow-up with your pharmacy.

## 2024-04-11 ENCOUNTER — Encounter: Payer: Self-pay | Admitting: Nurse Practitioner

## 2024-04-11 ENCOUNTER — Other Ambulatory Visit: Payer: Self-pay | Admitting: Nurse Practitioner

## 2024-04-11 DIAGNOSIS — R058 Other specified cough: Secondary | ICD-10-CM

## 2024-04-11 MED ORDER — HYDROCODONE BIT-HOMATROP MBR 5-1.5 MG/5ML PO SOLN
5.0000 mL | Freq: Four times a day (QID) | ORAL | 0 refills | Status: DC | PRN
Start: 2024-04-11 — End: 2024-05-15

## 2024-04-11 MED ORDER — HYDROCODONE BIT-HOMATROP MBR 5-1.5 MG/5ML PO SOLN
5.0000 mL | Freq: Four times a day (QID) | ORAL | 0 refills | Status: DC | PRN
Start: 2024-04-11 — End: 2024-04-11

## 2024-04-13 DIAGNOSIS — E782 Mixed hyperlipidemia: Secondary | ICD-10-CM | POA: Diagnosis not present

## 2024-04-14 LAB — COMPREHENSIVE METABOLIC PANEL WITH GFR
ALT: 13 IU/L (ref 0–32)
AST: 13 IU/L (ref 0–40)
Albumin: 4.6 g/dL (ref 3.8–4.8)
Alkaline Phosphatase: 86 IU/L (ref 44–121)
BUN/Creatinine Ratio: 22 (ref 12–28)
BUN: 17 mg/dL (ref 8–27)
Bilirubin Total: 0.6 mg/dL (ref 0.0–1.2)
CO2: 24 mmol/L (ref 20–29)
Calcium: 9.9 mg/dL (ref 8.7–10.3)
Chloride: 95 mmol/L — ABNORMAL LOW (ref 96–106)
Creatinine, Ser: 0.78 mg/dL (ref 0.57–1.00)
Globulin, Total: 2.5 g/dL (ref 1.5–4.5)
Glucose: 94 mg/dL (ref 70–99)
Potassium: 4.6 mmol/L (ref 3.5–5.2)
Sodium: 134 mmol/L (ref 134–144)
Total Protein: 7.1 g/dL (ref 6.0–8.5)
eGFR: 81 mL/min/{1.73_m2} (ref >59)

## 2024-04-14 LAB — LIPID PANEL
Chol/HDL Ratio: 3.7 ratio (ref 0.0–4.4)
Cholesterol, Total: 226 mg/dL — ABNORMAL HIGH (ref 100–199)
HDL: 61 mg/dL (ref >39)
LDL Chol Calc (NIH): 149 mg/dL — ABNORMAL HIGH (ref 0–99)
Triglycerides: 93 mg/dL (ref 0–149)
VLDL Cholesterol Cal: 16 mg/dL (ref 5–40)

## 2024-04-20 ENCOUNTER — Telehealth: Payer: Self-pay

## 2024-04-20 NOTE — Telephone Encounter (Signed)
 Called patient see if she got the of hydrocodone . Patient stated she really wanted to talk to PCP but she did not get the . Provider notified.

## 2024-04-23 ENCOUNTER — Encounter (HOSPITAL_BASED_OUTPATIENT_CLINIC_OR_DEPARTMENT_OTHER): Payer: Self-pay

## 2024-04-23 ENCOUNTER — Telehealth: Payer: Self-pay | Admitting: Nurse Practitioner

## 2024-04-23 DIAGNOSIS — J302 Other seasonal allergic rhinitis: Secondary | ICD-10-CM

## 2024-04-23 DIAGNOSIS — R058 Other specified cough: Secondary | ICD-10-CM

## 2024-04-23 NOTE — Telephone Encounter (Signed)
 Returned call to patient since her message last week. She was concerned about the way the pharmacist interacted with her about her hycodan syrup. I did explain that we could try her on an albuterol  or Airsupra (inhaler) or another steroid inhaler to help with not needing the narcotic cough syrup. She has seen Pulmonology last year who also made recommendations as well as one to an allergy specialist in [facility] but she did not want to go out of town. She does inform me that she has taken allergy injections in the past when she was younger for about 3 years. 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 I will refer her to Dr. Tempie Fee.

## 2024-04-25 ENCOUNTER — Encounter (HOSPITAL_BASED_OUTPATIENT_CLINIC_OR_DEPARTMENT_OTHER): Payer: Self-pay | Admitting: Cardiovascular Disease

## 2024-04-25 ENCOUNTER — Ambulatory Visit (HOSPITAL_BASED_OUTPATIENT_CLINIC_OR_DEPARTMENT_OTHER): Payer: Medicare Other | Admitting: Cardiovascular Disease

## 2024-04-25 ENCOUNTER — Other Ambulatory Visit (INDEPENDENT_AMBULATORY_CARE_PROVIDER_SITE_OTHER): Payer: Self-pay | Admitting: Family Medicine

## 2024-04-25 VITALS — BP 136/70 | HR 79 | Ht 62.0 in | Wt 185.2 lb

## 2024-04-25 DIAGNOSIS — I10 Essential (primary) hypertension: Secondary | ICD-10-CM | POA: Diagnosis not present

## 2024-04-25 DIAGNOSIS — E119 Type 2 diabetes mellitus without complications: Secondary | ICD-10-CM

## 2024-04-25 DIAGNOSIS — I700 Atherosclerosis of aorta: Secondary | ICD-10-CM

## 2024-04-25 DIAGNOSIS — I119 Hypertensive heart disease without heart failure: Secondary | ICD-10-CM | POA: Diagnosis not present

## 2024-04-25 DIAGNOSIS — E782 Mixed hyperlipidemia: Secondary | ICD-10-CM | POA: Diagnosis not present

## 2024-04-25 MED ORDER — SPIRONOLACTONE 25 MG PO TABS: 25.0000 mg | ORAL_TABLET | Freq: Every day | ORAL | 3 refills | Status: AC

## 2024-04-25 MED ORDER — GEMFIBROZIL 600 MG PO TABS: 600.0000 mg | ORAL_TABLET | Freq: Two times a day (BID) | ORAL | 3 refills | Status: DC

## 2024-04-25 MED ORDER — HYDROCHLOROTHIAZIDE 12.5 MG PO TABS: 25.0000 mg | ORAL_TABLET | Freq: Every day | ORAL | 3 refills | Status: DC

## 2024-04-25 MED ORDER — CLONIDINE HCL 0.1 MG PO TABS: 0.1000 mg | ORAL_TABLET | Freq: Two times a day (BID) | ORAL | 3 refills | Status: DC

## 2024-04-25 NOTE — Patient Instructions (Addendum)
 Medication Instructions:  Your physician has recommended you make the following change in your medication:   Change: hydrochlorothiazide  12.5mg  daily   Change: Spirolactone 25mg  daily   Start: Clonidine 0.1mg  twice daily   Start: Genfibrozil 600mg  twice daily   Lab Work: Your physician recommends that you return for lab work in 2-3 months for Fasting Lipid Panel & CMP   Follow-Up:  Donivan Furry [facility])- 2 months   Please follow up in 1 year with Dr. Theodis Fiscal, Slater Duncan, NP or Neomi Banks, NP

## 2024-04-25 NOTE — Progress Notes (Signed)
 Cardiology Office Note:  .   Date:  04/25/2024  ID:  Deborah [name], DOB 23-Dec-1950, MRN [id number] PCP: Susanna Epley, FNP  [name] Providers Cardiologist:  None    History of Present Illness: .    Deborah [name] is a 73 y.o. female with hypertension, hypothyroidism, and diabetes here for follow-up.  She was initially seen 12/2019 for hypertension.  Deborah [name] reports having high blood pressure for years.  When her daughter was born she had a hypertensive crisis and struggled with BP since that time.  It has never been very well-controlled.    She saw Susanna Epley, FNP, on 09/2019 and her BP was 170/98.  She has a history of intolerance to many medications so she was referred to cardiology for further management.  She reports having been on HCTZ for many years.   She notices that her BP is worse when she is feeling stressed.  Prior to COVID-19 she was exercising regularly.  Hydrochlorothiazide  was reduced and she was started on spironolactone .  Rosuvastatin  was increased to daily.  At her visit 01/2020 hydralazine  was increased.     Deborah [name] did not see cardiology again until 12/2022 when she saw Charles Connor, NP.  At that time she was feeling well but had been off of her hydralazine  and spironolactone  for the preceding 3 weeks.  At that time spironolactone  was discontinued and HCTZ was continued.  Her blood pressure was consistently elevated so spironolactone  was restarted and hydralazine  was increased.  She reported atypical chest pain and was referred for coronary CTA 06/2023 that revealed mild nonobstructive disease and a calcium  score of 256, which was 87 percentile.  She had an echo that revealed normal LVEF and grade 1 diastolic dysfunction.  She had moderate tricuspid regurgitation.   Deborah [name] reports a history of poor tolerance to multiple statin medications, including rosuvastatin , pravastatin , Lipitor, and Zetia , all of which have caused significant muscle  cramps and aches.  She was started on Leqvio.  At her last visit she was struggling with insomnia and nocturia.  BP was uncontrolled.  Hydrochlorothiazide  was discontinued and spironolactone  was increased.    Deborah [name] also reports a recent increase in nocturia, which she attributes to her current regimen of spironolactone  and hydrochlorothiazide . She expresses frustration with frequent urination and its impact on her sleep.   Her left arm and shoulder pain is a daily occurrence and is exacerbated by movement. She reports a remote fall in a grocery store after starting amitriptyline . She has not sought orthopedic consultation for this issue yet.   [name] remains active despite her knee pain, working in a clinic once or twice a week. She denies any chest pain, pressure, or significant dyspnea. She also reports a two-week lapse in her thyroid  medication due to issues with her pharmacy.   Her blood pressure control has been suboptimal, with home readings often in the 130s systolic and 80s diastolic. She has been taking hydralazine  150mg  twice daily.   Discussed the use of AI scribe software for clinical note transcription with the patient, who gave verbal consent to proceed.  History of Present Illness Deborah [name] notes that her blood pressure has been well-controlled since the increase in spironolactone , but she experiences frequent nocturia, waking up four times a night, which she attributes to her current dose of hydrochlorothiazide . She has been on hydrochlorothiazide  for many years, while spironolactone  was added more recently. She is considering reducing the dose of hydrochlorothiazide  or finding  an alternative medication due to the impact on her sleep.  She has a history of allergies to several blood pressure medications, including lisinopril and beta blockers, which cause her to cough. She mentions a persistent cough that has been severe enough to prevent her from working. She also  notes sporadic chest pains occurring about three times a month, described as a 'fleeting chest pain' or 'something turning over in my chest,' lasting less than ten minutes. No association with acid reflux, as previous treatments for reflux have not been effective.  Regarding her hyperlipidemia, she adjusted her diet, which resulted in some improvement in her lipid levels, but not to her desired extent. She is reluctant to use Repatha due to its administration method and has previously experienced muscle pain with statins. She is interested in trying gemfibrozil. She has been on pravastatin  in the past, which caused leg pain, and is considering a reduced frequency of use to mitigate side effects.  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 She reports feeling well during exercise but occasionally experiences the aforementioned chest pains. No significant acid reflux symptoms and has been evaluated for this in the past without conclusive findings.  ROS:  As per HPI  Studies Reviewed: .       Echo 07/15/23: 1. Left ventricular ejection fraction, by estimation, is 60 to 65%. The  left ventricle has normal function. The left ventricle has no regional  wall motion abnormalities. Left ventricular diastolic parameters are  consistent with Grade I diastolic  dysfunction (impaired relaxation).   2. Right ventricular systolic function is normal. The right ventricular  size is normal. There is normal pulmonary artery systolic pressure.   3. The mitral valve is normal in structure. Mild mitral valve  regurgitation. No evidence of mitral stenosis.   4. The aortic valve is normal in structure. Aortic valve regurgitation is  trivial. No aortic stenosis is present.   5. Pulmonic valve regurgitation is moderate.   6. The inferior vena cava is normal in size with greater than 50%  respiratory variability, suggesting right atrial pressure of  3 mmHg.   Risk Assessment/Calculations:             Physical Exam:   VS:  BP 136/70 (BP Location: Right Arm, Patient Position: Sitting, Cuff Size: Normal)   Pulse 79   Ht 5\' 2"  (1.575 m)   Wt 185 lb 3.2 oz (84 kg)   SpO2 97%   BMI 33.87 kg/m  , BMI Body mass index is 33.87 kg/m. GENERAL:  Well appearing HEENT: Pupils equal round and reactive, fundi not visualized, oral mucosa unremarkable NECK:  No jugular venous distention, waveform within normal limits, carotid upstroke brisk and symmetric, no bruits, no thyromegaly LUNGS:  Clear to auscultation bilaterally HEART:  RRR.  PMI not displaced or sustained,S1 and S2 within normal limits, no S3, no S4, no clicks, no rubs, no murmurs ABD:  Flat, positive bowel sounds normal in frequency in pitch, no bruits, no rebound, no guarding, no midline pulsatile mass, no hepatomegaly, no splenomegaly EXT:  2 plus pulses throughout, no edema, no cyanosis no clubbing SKIN:  No rashes no nodules NEURO:  Cranial nerves II through XII grossly intact, motor grossly intact throughout PSYCH:  Cognitively intact, oriented to person place and time   ASSESSMENT AND PLAN: .    Assessment & Plan # Hypertension Hypertension controlled with current regimen. Nocturia due to diuretics. Limited  antihypertensive options due to allergies. Clonidine considered to manage blood pressure without worsening nocturia.  She has intolerances to many antihypertensives.  - Prescribe clonidine 0.1 mg twice daily. - Reduce hydrochlorothiazide  to 12.5 mg daily.    - Monitor blood pressure regularly; discontinue hydrochlorothiazide  if hypotension occurs. - Reduce spironolactone  to 25mg  daily.  Continue hydralazine .   # Hyperlipidemia Hyperlipidemia not controlled with diet. Leqvio was denied by her insurance.  Repatha declined due to injection concerns. Statins cause myalgia. Gemfibrozil considered despite limited efficacy in reducing cardiovascular risk. - Prescribe  gemfibrozil once daily. - Check lipid panel in three months.  # Sporadic chest pain # Non-obstructive CAD:  Intermittent chest pain not related to coronary artery disease. Possibly non-cardiac in origin.  # Chronic cough Chronic cough persists. Possible allergy or GERD-related. Acid reflux medications poorly tolerated. Referral to allergist suggested.  Follow-up - Schedule follow-up in two months with PharmD to reassess blood pressure and lipid levels. - Instruct her to record blood pressure readings and bring them to the next appointment.     Signed, Maudine Sos, MD

## 2024-04-29 ENCOUNTER — Encounter (HOSPITAL_BASED_OUTPATIENT_CLINIC_OR_DEPARTMENT_OTHER): Payer: Self-pay | Admitting: Cardiovascular Disease

## 2024-04-30 MED ORDER — CLONIDINE HCL 0.1 MG PO TABS: ORAL_TABLET | ORAL | 3 refills | Status: AC

## 2024-04-30 NOTE — Telephone Encounter (Signed)
 Follow up question

## 2024-04-30 NOTE — Telephone Encounter (Signed)
 Half tablet AM and whole tablet PM would be fine. I would recommend checking BP once per day and sending us  a log in one week.   Detra Bores S Carlesha Seiple, NP

## 2024-04-30 NOTE — Telephone Encounter (Signed)
 Please review and advise.

## 2024-05-12 ENCOUNTER — Other Ambulatory Visit: Payer: Self-pay | Admitting: Nurse Practitioner

## 2024-05-15 ENCOUNTER — Other Ambulatory Visit: Payer: Self-pay | Admitting: Nurse Practitioner

## 2024-05-15 ENCOUNTER — Encounter: Payer: Self-pay | Admitting: Nurse Practitioner

## 2024-05-15 DIAGNOSIS — R058 Other specified cough: Secondary | ICD-10-CM

## 2024-05-15 MED ORDER — HYDROCODONE BIT-HOMATROP MBR 5-1.5 MG/5ML PO SOLN: 5.0000 mL | Freq: Four times a day (QID) | ORAL | 0 refills | Status: DC | PRN

## 2024-06-04 ENCOUNTER — Ambulatory Visit: Admitting: Internal Medicine

## 2024-06-04 ENCOUNTER — Other Ambulatory Visit: Payer: Self-pay

## 2024-06-04 ENCOUNTER — Encounter: Payer: Self-pay | Admitting: Internal Medicine

## 2024-06-04 VITALS — BP 138/82 | HR 68 | Temp 98.1°F | Ht 62.6 in | Wt 184.9 lb

## 2024-06-04 DIAGNOSIS — R053 Chronic cough: Secondary | ICD-10-CM | POA: Diagnosis not present

## 2024-06-04 DIAGNOSIS — Z789 Other specified health status: Secondary | ICD-10-CM | POA: Diagnosis not present

## 2024-06-04 DIAGNOSIS — J310 Chronic rhinitis: Secondary | ICD-10-CM

## 2024-06-04 MED ORDER — IPRATROPIUM BROMIDE 0.03 % NA SOLN: 1.0000 | Freq: Every evening | NASAL | 12 refills | Status: AC | PRN

## 2024-06-04 NOTE — Patient Instructions (Addendum)
 Chronic cough: upper airway cough syndrome. Chronic cough exacerbated by seasonal pollen, possibly due to allergies, nerve damage, or reflux. Previous treatments ineffective or intolerable. Limited options due to medication intolerances. Hast tried and failed medications for GERD, antihistamines, multiple inhalers. Responsive only to hycodan and briefly to steroids and antibiotics. No longer following with pulmonary. Has never been evaluated by ENT.  - Order blood work for environmental allergies. - Refer to ENT for further evaluation. - Prescribe Atrovent nasal spray, one spray each nostril at night. - Advise use of nasal saline spray before Atrovent to hydrate and loosen mucus. - Obtain GI records for further evaluation.  History of allergic rhinitis Chronic rhinitis with ineffective antihistamines and nasal sprays. Allergy testing will guide potential allergy injections. - Order blood work for environmental allergies. - Consider allergy injections based on allergy test results.  Medication intolerance Intolerance to multiple medications limits treatment options for chronic cough and other conditions.      Follow up : 3 months, sooner if needed.  It was a pleasure meeting you in clinic today! Thank you for allowing me to participate in your care.

## 2024-06-04 NOTE — Progress Notes (Signed)
 NEW PATIENT Date of Service/Encounter:   06/04/2024 Referring provider: Susanna Epley, FNP Primary care provider: Susanna Epley, FNP  Subjective:  Deborah [name] is a 73 y.o. female with a PMHx of HTN, vitamin D  deficiency, hypothyroidism, DM2, obesity, urinary incontinence presenting today for evaluation of chronic cough and rhinitis.  History obtained from: chart review and patient.   Discussed the use of AI scribe software for clinical note transcription with the patient, who gave verbal consent to proceed.  History of Present Illness   Deborah [name] is a 73 year old female who presents with a chronic cough.  She has experienced a chronic cough for many years, describing it as a lifelong issue. The cough is particularly worse during the spring or when exposed to seasonal pollen, and it is characterized as a wet cough with phlegm production. The cough significantly disrupts her sleep, as she coughs throughout the night and experiences very little rest. She also coughs frequently at work.  She has been using Hycodan cough syrup at night, which helps to some extent, but due to the opioid crisis, her physicians are reluctant to continue prescribing it. She has tried numerous treatments for her cough, including seeing a lung physician for a year, who attempted various medications including those for GERD, but she is intolerant to medications like Pepcid and Prilosec, which cause nausea and vomiting. Inhalers, including bronchodilators and tessalon  pearls, have been tried without success. Gabapentin  was prescribed but caused severe side effects, leading her to list it as an allergy. She has a history of intolerance to many medications, including several blood pressure medications and cholesterol statins, which have exacerbated her cough. Beta blockers, including those in eye drops like timolol, worsened her cough.  She has a history of allergy shots, which she took for a year  before her first pregnancy, and found them somewhat helpful. She has not had an upper endoscopy but has undergone a bronchoscopy in the past, which provided temporary relief. Sinus x-rays in the past showed clogged sinuses. She has never seen ENT.  She has been prescribed prednisone  during severe cough episodes, which provides short-term relief, but the cough returns after the treatment ends. She has tried various antihistamines, including Zyrtec, Claritin, Allegra, and Benadryl , but they either do not work or cause adverse effects. Mucinex  was also tried but worsened her cough. She experiences postnasal drip, especially at night, leading to increased phlegm production.  She has not been on ipratropium nasal spray that she knows of.  She has tried inhaled steroids which were not effective.  She has not tried montelukast (Singulair).Aaron Aas Antibiotics, when combined with prednisone , have provided temporary relief during severe colds. She experiences mucus drainage down her throat at night, requiring her to sleep on two pillows. She has not tried Atrovent nasal spray or nasal saline rinses.       Chart Review:  [facility] (LV 04/25/24): She has a history of allergies to several blood pressure medications, including lisinopril and beta blockers, which cause her to cough. She mentions a persistent cough that has been severe enough to prevent her from working. She also notes sporadic chest pains occurring about three times a month, described as a 'fleeting chest pain' or 'something turning over in my chest,' lasting less than ten minutes. No association with acid reflux, as previous treatments for reflux have not been effective.  And Plan: # Chronic cough Chronic cough persists. Possible allergy or GERD-related. Acid reflux medications poorly tolerated.  Referral to allergist suggested.  Extensive medication allergy list-none of which are to penicillins.  Last pulmonary visit 02/22/23:  # Chronic cough Onset in her 20s/ no better with allergy shots, saba/ worse with laughter/perfume and severe to point of urinary incont and vomiting  - 11/19/2022 rx aciphex  and 1st gen H1 blockers per guidelines  > no better 01/11/2023  - 01/11/2023 added elavil  at hs  - Sinus ct  01/24/23  c/w bubbly secretions > ent eval if not improving  - FENO  01/11/2023 =  17  - 02/22/2023 referred to ENT ([facility]) with gabapentin  trial in meantime  and double aciphex  to bid  ac   Not sure the sinus ct findings have anything to do with the cough which is more habitual than physiologic - hycodan is the only thing that helps but does not result in a throat full of mucus in am which might be the case of just suppress an abnormal pnds due to sinus dz    Rec trial of gabapentin  starting with 100 mg and max of 1200 mg daily  Per notes on 03/02/23 with Pulmonary: she was unable to tolerate gabapentin  and tried chlorpehniramine which she also reports she could not tolerate  Pertinent labs/imaging:  CXR 01/11/23: no active cardiopulmonary disease Maxillofacilal CT 2024: 01/23/23: 1. Scattered mild paranasal sinus mucosal thickening and bubbly opacity does not appear significantly changed from a 2016 Head CT, along with an incidental appearing right sphenoid sinus mucous retention cyst.  2. Middle ears and mastoids remain clear 2022: CBCd: AEC 100  Past Medical History: Past Medical History:  Diagnosis Date   Back pain    Death of child 71   Due to cord strangulation.   Diabetes mellitus without complication (HCC)    Hyperlipidemia    Hypertension    Hypothyroidism    Joint pain    Nonobstructive atherosclerosis of coronary artery    Osteoarthritis    Other fatigue    Shortness of breath on exertion    Thyroid  condition    Medication List:  Current Outpatient Medications  Medication Sig Dispense Refill   acetaminophen  (TYLENOL ) 500 MG tablet Take 1,000 mg by mouth at bedtime.     aspirin EC 81  MG tablet Take 81 mg by mouth daily. Swallow whole.     Blood Glucose Monitoring Suppl (ACURA BLOOD GLUCOSE METER) w/Device KIT Check blood sugars twice daily. Please provide patient with meter and supplies that is covered by pt ins. 1 kit 0   Blood Glucose Monitoring Suppl (ONE TOUCH ULTRA MINI) w/Device KIT Use to check blood sugars 3 times a day. Dx code:e11.65 1 kit 0   cholecalciferol (VITAMIN D ) 1000 units tablet Take 2,000 Units by mouth daily.     cloNIDine  (CATAPRES ) 0.1 MG tablet Please take a half tablet in the morning and a whole tablet in the evening ( 12 hours apart) 180 tablet 3   cyclobenzaprine  (FLEXERIL ) 10 MG tablet Take 1 tablet (10 mg total) by mouth 2 (two) times daily as needed for muscle spasms. 20 tablet 0   diclofenac  Sodium (VOLTAREN ) 1 % GEL Apply 4 g topically 4 (four) times daily. 100 g 0   docusate sodium (COLACE) 100 MG capsule Take 100 mg by mouth daily.     hydrALAZINE  (APRESOLINE ) 100 MG tablet TAKE 1 AND 1/2 TABLETS BY MOUTH  TWICE DAILY 300 tablet 2   hydrochlorothiazide  (HYDRODIURIL ) 12.5 MG tablet Take 2 tablets (25 mg total) by mouth daily. 45  tablet 3   HYDROcodone  bit-homatropine (HYCODAN) 5-1.5 MG/5ML syrup Take 5 mLs by mouth every 6 (six) hours as needed for cough. 120 mL 0   ipratropium (ATROVENT) 0.03 % nasal spray Place 1 spray into both nostrils at bedtime as needed for rhinitis. 30 mL 12   levothyroxine  (SYNTHROID ) 88 MCG tablet Take 1 tablet (88 mcg total) by mouth daily before breakfast. 100 tablet 1   MAGNESIUM  PO Take 375 mg by mouth daily.     Melatonin 10 MG CAPS Take 10 mg by mouth at bedtime.     metFORMIN  (GLUCOPHAGE -XR) 500 MG 24 hr tablet Take 1 tablet (500 mg total) by mouth 2 (two) times daily. 180 tablet 1   naproxen  sodium (ALEVE ) 220 MG tablet Take 220 mg by mouth daily as needed.     spironolactone  (ALDACTONE ) 25 MG tablet Take 1 tablet (25 mg total) by mouth daily. 90 tablet 3   Turmeric 500 MG CAPS Take 1 tablet by mouth as needed  (Takes occassionally).     COLLAGEN PO Take 1 tablet by mouth daily. (Patient not taking: Reported on 06/04/2024)     gemfibrozil  (LOPID ) 600 MG tablet Take 1 tablet (600 mg total) by mouth 2 (two) times daily before a meal. (Patient not taking: Reported on 06/04/2024) 180 tablet 3   glucose blood (ONETOUCH ULTRA) test strip USE WITH METER TO CHECK BLOOD  SUGAR TWICE DAILY BEFORE  BREAKFAST AND BEFORE DINNER 200 strip 2   No current facility-administered medications for this visit.   Known Allergies:  Allergies  Allergen Reactions   Amlodipine     headache   Aspartame And Phenylalanine Other (See Comments)    Causes migraines   Bidil [Isosorb Dinitrate-Hydralazine ]     headache   Elavil  [Amitriptyline ] Other (See Comments)    Loss of balance, hallucinations   Erythromycin     Ask patient to specify type/severity/reaction. Not indicated on medical history form dated 11/04/10.   Fentanyl      Causes panic attacks   Ibuprofen     Ask patient to specify type/severity/reaction. Not noted on medical history form dated 11/04/10.   Lisinopril Cough   Metoprolol  Other (See Comments)   Phenylalanine Other (See Comments)    Causes migraine   Prilosec [Omeprazole] Nausea And Vomiting   Rosuvastatin  Other (See Comments)   Tramadol  Nausea And Vomiting   Valsartan Cough   Verapamil Nausea And Vomiting   Zantac [Ranitidine Hcl]    Zetia  [Ezetimibe ]     myalgias   Past Surgical History: Past Surgical History:  Procedure Laterality Date   APPENDECTOMY  1974   CARDIAC CATHETERIZATION     two, dates not provided.   CHOLECYSTECTOMY     confirm date with patient, was it 25?   LAPAROSCOPIC TUBAL LIGATION  1987   THYROIDECTOMY     Family History: Family History  Problem Relation Age of Onset   COPD Mother    Diabetes Mother    Hypertension Mother    Obesity Mother    Heart disease Father        died of sudden MI   Hypertension Father    Glaucoma Father    Social History: Kristie  lives in a house built in the 80s, no water damage, carpet floors, electric heating, central AC, no pets, no roaches, not using dust mite covers on the bed of the pillows, no smoke exposure.  Works as a Programme researcher, broadcasting/film/video as needed.  Helps with infusion.  Not  exposed to fumes chemicals or dust at her job or hobbies.  No HEPA filter in the home.  [place]   ROS:  All other systems negative except as noted per HPI.  Objective:  Blood pressure 138/82, pulse 68, temperature 98.1 F (36.7 C), temperature source Temporal, height 5' 2.6 (1.59 m), weight 184 lb 14.4 oz (83.9 kg), SpO2 96%. Body mass index is 33.18 kg/m. Physical Exam:  General Appearance:  Alert, cooperative, no distress, appears stated age  Head:  Normocephalic, without obvious abnormality, atraumatic  Eyes:  Conjunctiva clear, EOM's intact  Ears EACs normal bilaterally and normal TMs bilaterally  Nose: Nares normal, normal mucosa and no visible anterior polyps  Throat: Lips, tongue normal; teeth and gums normal, normal posterior oropharynx  Neck: Supple, symmetrical  Lungs:   clear to auscultation bilaterally, Respirations unlabored, no coughing  Heart:  regular rate and rhythm and no murmur, Appears well perfused  Extremities: No edema  Skin: Skin color, texture, turgor normal and no rashes or lesions on visualized portions of skin  Neurologic: No gross deficits   Diagnostics: Spirometry:  Tracings reviewed. Her effort: Good reproducible efforts. FVC: 1.73L  FEV1: 1.44L, 83% predicted  FEV1/FVC ratio: 0.83  Interpretation: Spirometry consistent with normal pattern.  Please see scanned spirometry results for details.  Labs:  Lab Orders         Allergens Zone 2       Assessment and Plan  Assessment and Plan    Chronic cough Chronic cough exacerbated by seasonal pollen, possibly due to allergies, nerve damage, or reflux. Previous treatments ineffective or intolerable. Limited  options due to medication intolerances. Hast tried and failed medications for GERD, antihistamines, multiple inhalers. Responsive only to hycodan and briefly to steroids and antibiotics. No longer following with pulmonary. Has never been evaluated by ENT.  -Spirometry today looked normal. - Order blood work for environmental allergies. - Refer to ENT for further evaluation. - Prescribe Atrovent nasal spray, one spray each nostril at night. - Advise use of nasal saline spray before Atrovent to hydrate and loosen mucus. - Obtain GI records for further evaluation.  History of allergic rhinitis Chronic rhinitis with ineffective antihistamines and nasal sprays. Allergy testing will guide potential allergy injections. - Order blood work for environmental allergies. - Consider allergy injections based on allergy test results.  Medication intolerance Intolerance to multiple medications limits treatment options for chronic cough and other conditions.      Follow up : 3 months, sooner if needed.  It was a pleasure meeting you in clinic today! Thank you for allowing me to participate in your care.  Jonathon Neighbors, MD [facility]    This note in its entirety was forwarded to the Provider who requested this consultation.  Other: samples provided of: neil med saline spray  Thank you for your kind referral. I appreciate the opportunity to take part in [place] care. Please do not hesitate to contact me with questions.  Sincerely,  Jonathon Neighbors, MD [facility] 

## 2024-06-05 ENCOUNTER — Encounter (INDEPENDENT_AMBULATORY_CARE_PROVIDER_SITE_OTHER): Payer: Self-pay

## 2024-06-05 ENCOUNTER — Telehealth: Payer: Self-pay | Admitting: Internal Medicine

## 2024-06-05 NOTE — Telephone Encounter (Signed)
 Deborah [name] has been internally referred to [facility].  I made a note in the referral to schedule her with female providers only.  They will reach out to the patient to schedule.

## 2024-06-07 ENCOUNTER — Ambulatory Visit: Payer: Self-pay | Admitting: Internal Medicine

## 2024-06-07 LAB — IGE+ALLERGENS ZONE 2(30)

## 2024-06-07 NOTE — Progress Notes (Signed)
 Please let Deborah [name] know that her environmental panel is negative and she makes a relatively low number of allergic antibody.  Based on these results, do not suspect that allergies are playing a role in her symptoms. She has been referred to ENT for further evaluation.

## 2024-06-14 ENCOUNTER — Ambulatory Visit: Admitting: Podiatry

## 2024-06-14 ENCOUNTER — Other Ambulatory Visit: Payer: Self-pay | Admitting: Nurse Practitioner

## 2024-06-14 ENCOUNTER — Encounter (INDEPENDENT_AMBULATORY_CARE_PROVIDER_SITE_OTHER): Payer: Self-pay

## 2024-06-14 ENCOUNTER — Encounter: Payer: Self-pay | Admitting: Nurse Practitioner

## 2024-06-14 DIAGNOSIS — E119 Type 2 diabetes mellitus without complications: Secondary | ICD-10-CM | POA: Diagnosis not present

## 2024-06-14 DIAGNOSIS — M21619 Bunion of unspecified foot: Secondary | ICD-10-CM

## 2024-06-14 DIAGNOSIS — R058 Other specified cough: Secondary | ICD-10-CM

## 2024-06-14 MED ORDER — HYDROCODONE BIT-HOMATROP MBR 5-1.5 MG/5ML PO SOLN
5.0000 mL | Freq: Four times a day (QID) | ORAL | 0 refills | Status: DC | PRN
Start: 2024-06-14 — End: 2024-07-12

## 2024-06-14 NOTE — Progress Notes (Signed)
  Subjective:  Patient ID: Deborah [name], female    DOB: 01/06/1951,  MRN: [id number]  Chief Complaint  Patient presents with   [facility]    RM#13 [facility] patient has no other concerns.    Discussed the use of AI scribe software for clinical note transcription with the patient, who gave verbal consent to proceed.  History of Present Illness Deborah [name] is a 73 year old female with diabetes who presents for a diabetic foot exam.  Her blood sugar control is good, with a recent A1c of 6.1. She has no wounds, numbness, or tingling in her feet and no recent foot injuries. She has a bunion on her right foot, which may require surgical intervention but she does not want to proceed with that. She experiences leg cramps, which she attributes to her statin medication.      Objective:    Physical Exam General: AAO x3, NAD  Dermatological: There are no ulcerations present bilaterally.  Dry skin is present to the heels however it is mild.   Vascular: Dorsalis Pedis artery and Posterior Tibial artery pedal pulses are 2/4 bilateral with immedate capillary fill time.  There is no pain with calf compression, swelling, warmth, erythema.   Neruologic: Grossly intact via light touch bilateral. Sensation intact with SWMF.  Musculoskeletal:  There are no areas of tenderness bilaterally. Bunion is present on the right. No pain on exam. MMT 5/5.      No images are attached to the encounter.    Results LABS HbA1c: 6.1% (03/14/2024)   Assessment:   1. Bunion   2. Type II diabetes mellitus, well controlled (HCC)   3. Encounter for diabetic foot exam [facility])      Plan:  Patient was evaluated and treated and all questions answered.  Assessment and Plan Assessment & Plan Bunion, right foot Bunion present on right foot. Surgical intervention discussed as definitive treatment but she is not ready to proceed.  - Discuss surgical options for future bunion removal if desired. Continue  offloading, shoe modifications to help decrease pressure.   Type 2 diabetes mellitus without complications Type 2 diabetes well-controlled with A1c of 6.1. No complications or foot issues noted. Emphasized regular foot checks to prevent complications. - Advise daily foot inspections. - Schedule yearly diabetic foot exam. - Instruct to report new foot issues immediately.  Return in about 1 year (around 06/14/2025) for diabetic foot exam .  Donnice JONELLE Fees DPM

## 2024-06-14 NOTE — Patient Instructions (Signed)

## 2024-06-27 DIAGNOSIS — I10 Essential (primary) hypertension: Secondary | ICD-10-CM | POA: Diagnosis not present

## 2024-06-27 DIAGNOSIS — I119 Hypertensive heart disease without heart failure: Secondary | ICD-10-CM | POA: Diagnosis not present

## 2024-06-27 DIAGNOSIS — E782 Mixed hyperlipidemia: Secondary | ICD-10-CM | POA: Diagnosis not present

## 2024-06-27 DIAGNOSIS — I700 Atherosclerosis of aorta: Secondary | ICD-10-CM | POA: Diagnosis not present

## 2024-06-28 LAB — COMPREHENSIVE METABOLIC PANEL WITH GFR
ALT: 9 IU/L (ref 0–32)
AST: 12 IU/L (ref 0–40)
Albumin: 4.3 g/dL (ref 3.8–4.8)
Alkaline Phosphatase: 82 IU/L (ref 44–121)
BUN/Creatinine Ratio: 23 (ref 12–28)
BUN: 16 mg/dL (ref 8–27)
Bilirubin Total: 0.5 mg/dL (ref 0.0–1.2)
CO2: 20 mmol/L (ref 20–29)
Calcium: 9.5 mg/dL (ref 8.7–10.3)
Chloride: 96 mmol/L (ref 96–106)
Creatinine, Ser: 0.7 mg/dL (ref 0.57–1.00)
Globulin, Total: 2.4 g/dL (ref 1.5–4.5)
Glucose: 89 mg/dL (ref 70–99)
Potassium: 4.5 mmol/L (ref 3.5–5.2)
Sodium: 133 mmol/L — ABNORMAL LOW (ref 134–144)
Total Protein: 6.7 g/dL (ref 6.0–8.5)
eGFR: 92 mL/min/1.73 (ref >59)

## 2024-06-28 LAB — LIPID PANEL
Chol/HDL Ratio: 3.8 ratio (ref 0.0–4.4)
Cholesterol, Total: 207 mg/dL — ABNORMAL HIGH (ref 100–199)
HDL: 54 mg/dL (ref >39)
LDL Chol Calc (NIH): 140 mg/dL — ABNORMAL HIGH (ref 0–99)
Triglycerides: 70 mg/dL (ref 0–149)
VLDL Cholesterol Cal: 13 mg/dL (ref 5–40)

## 2024-06-29 ENCOUNTER — Other Ambulatory Visit: Payer: Self-pay | Admitting: Nurse Practitioner

## 2024-06-29 ENCOUNTER — Encounter (HOSPITAL_BASED_OUTPATIENT_CLINIC_OR_DEPARTMENT_OTHER): Payer: Self-pay

## 2024-06-29 ENCOUNTER — Ambulatory Visit (HOSPITAL_BASED_OUTPATIENT_CLINIC_OR_DEPARTMENT_OTHER): Admitting: Pharmacist Clinician (PhC)/ Clinical Pharmacy Specialist

## 2024-06-29 VITALS — BP 124/76 | Wt 186.6 lb

## 2024-06-29 DIAGNOSIS — E782 Mixed hyperlipidemia: Secondary | ICD-10-CM

## 2024-06-29 DIAGNOSIS — I10 Essential (primary) hypertension: Secondary | ICD-10-CM | POA: Diagnosis not present

## 2024-06-29 NOTE — Progress Notes (Signed)
 Office Visit    Patient Name: Gabrella Stroh Date of Encounter: 06/29/2024  Primary Care Provider:  Georgina Speaks, FNP Primary Cardiologist:  None  Chief Complaint    Hypertension - Advanced hypertension clinic; hyperlipidemia  Past Medical History   HLD 7/25 LDL 140, statin intolerant  CAD 7/24 CAC = 256 (87th percentile); on leqvio  preDM 3/25 A1c 6.1  hypothyroid On levothyroxine  88 mcg    Allergies  Allergen Reactions   Amlodipine     headache   Aspartame And Phenylalanine Other (See Comments)    Causes migraines   Bidil [Isosorb Dinitrate-Hydralazine ]     headache   Elavil  [Amitriptyline ] Other (See Comments)    Loss of balance, hallucinations   Erythromycin     Ask patient to specify type/severity/reaction. Not indicated on medical history form dated 11/04/10.   Fentanyl      Causes panic attacks   Ibuprofen     Ask patient to specify type/severity/reaction. Not noted on medical history form dated 11/04/10.   Lisinopril Cough   Metoprolol  Other (See Comments)   Phenylalanine Other (See Comments)    Causes migraine   Prilosec [Omeprazole] Nausea And Vomiting   Rosuvastatin  Other (See Comments)   Tramadol  Nausea And Vomiting   Valsartan Cough   Verapamil Nausea And Vomiting   Zantac [Ranitidine Hcl]    Zetia  [Ezetimibe ]     myalgias    History of Present Illness    Lynna Zamorano is a 73 y.o. female patient who was referred to cardiology for hypertension.  She reported having high blood pressure for years. When her daughter was born she had a hypertensive crisis and struggled with BP since that time. She also has an intolerance to multiple medications, making it harder to treat.  Most recently she was seen by Dr. Raford in May, with a pressure of 136/70.  Clonidine  0.1 mg bid was added, but 4 days later she sent a message reporting morning drowsiness and wanted to know if she could cut the morning dose to 1/2 tablet.  She also noted that  the gemfibrozil  was causing her muscle cramps and it was discontinued.   She had been willing to try Leqvio for her elevated LDL cholesterol, but insurance requires trial with evolocumab first.   Today she returns for follow up.  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   She has some problems with leg cramps and usually drinks dill pickle juice or Powerade to help with this.   She has not tolerated any lipid lowering therapies, and notes that all the concerns she has about Repatha she has picked up off the internet.    Repatha with grant MyChart when approved [facility] [address]  Blood Pressure Goal:  130/80  Current Medications: clonidine  0.05 mg in am/0.1 mg in pm, hydrochlorothiazide  12.5 mg daily, spironolactone  25 mg daily, hydralazine  150 mg bid  Previously tried:   amlodipine - headache   Bidil - headache   Lisinopril - cough   Metoprolol  - not listed   Valsartan - cough   Verapamil - nausea/vomiting  Family Hx:   both parents with hypertension, unsure of siblings; son 54 with hypertension, DM;   Social Hx:      Tobacco: no  Alcohol: rarely  Caffeine:  coffee 1 cup daily - standard  Diet:  half eating out, sit down restaurants; variety of proteins; daily vegetables, fresh  when can, plenty of fruit; doesn't snack much    Exercise: did silver sneakers for awhile, but works twice weekly in a infusion clinic; [facility])  Home BP readings:  checks home BP most days, highest 132 systolic, diastolic 81; has wrist cuff at home (2, devices, read interchangably)   Accessory Clinical Findings    Lab Results  Component Value Date   CREATININE 0.70 06/27/2024   BUN 16 06/27/2024   NA 133 (L) 06/27/2024   K 4.5 06/27/2024   CL 96 06/27/2024   CO2 20 06/27/2024   Lab Results  Component Value Date   ALT 9 06/27/2024   AST 12 06/27/2024   ALKPHOS 82 06/27/2024   BILITOT 0.5  06/27/2024   Lab Results  Component Value Date   HGBA1C 6.1 (H) 03/14/2024    Screening for Secondary Hypertension:      Relevant Labs/Studies:    Latest Ref Rng & Units 06/27/2024    9:03 AM 04/13/2024    9:19 AM 12/30/2023    8:48 AM  Basic Labs  Sodium 134 - 144 mmol/L 133  134  135   Potassium 3.5 - 5.2 mmol/L 4.5  4.6  4.6   Creatinine 0.57 - 1.00 mg/dL 9.29  9.21  9.30        Latest Ref Rng & Units 10/13/2023   10:12 AM 11/02/2022    9:56 AM  Thyroid    TSH 0.450 - 4.500 uIU/mL 2.900  0.575                   Home Medications    Current Outpatient Medications  Medication Sig Dispense Refill   acetaminophen  (TYLENOL ) 500 MG tablet Take 1,000 mg by mouth at bedtime.     aspirin EC 81 MG tablet Take 81 mg by mouth daily. Swallow whole.     Blood Glucose Monitoring Suppl (ACURA BLOOD GLUCOSE METER) w/Device KIT Check blood sugars twice daily. Please provide patient with meter and supplies that is covered by pt ins. 1 kit 0   Blood Glucose Monitoring Suppl (ONE TOUCH ULTRA MINI) w/Device KIT Use to check blood sugars 3 times a day. Dx code:e11.65 1 kit 0   cholecalciferol (VITAMIN D ) 1000 units tablet Take 2,000 Units by mouth daily.     cloNIDine  (CATAPRES ) 0.1 MG tablet Please take a half tablet in the morning and a whole tablet in the evening ( 12 hours apart) 180 tablet 3   cyclobenzaprine  (FLEXERIL ) 10 MG tablet Take 1 tablet (10 mg total) by mouth 2 (two) times daily as needed for muscle spasms. 20 tablet 0   diclofenac  Sodium (VOLTAREN ) 1 % GEL Apply 4 g topically 4 (four) times daily. 100 g 0   docusate sodium (COLACE) 100 MG capsule Take 100 mg by mouth daily.     glucose blood (ONETOUCH ULTRA) test strip USE WITH METER TO CHECK BLOOD  SUGAR TWICE DAILY BEFORE  BREAKFAST AND BEFORE DINNER 200 strip 2   hydrALAZINE  (APRESOLINE ) 100 MG tablet TAKE 1 AND 1/2 TABLETS BY MOUTH  TWICE DAILY 300 tablet 2   hydrochlorothiazide  (HYDRODIURIL ) 12.5 MG tablet Take 2 tablets  (25 mg total) by mouth daily. 45 tablet 3   HYDROcodone  bit-homatropine (HYCODAN) 5-1.5 MG/5ML syrup Take 5 mLs by mouth every 6 (six) hours as needed for cough. 120 mL 0   ipratropium (ATROVENT ) 0.03 % nasal spray Place 1 spray into both nostrils at bedtime as needed for rhinitis. 30 mL 12   levothyroxine  (  SYNTHROID ) 88 MCG tablet Take 1 tablet (88 mcg total) by mouth daily before breakfast. 100 tablet 1   MAGNESIUM  PO Take 375 mg by mouth daily.     Melatonin 10 MG CAPS Take 10 mg by mouth at bedtime.     metFORMIN  (GLUCOPHAGE -XR) 500 MG 24 hr tablet Take 1 tablet (500 mg total) by mouth 2 (two) times daily. 180 tablet 1   naproxen  sodium (ALEVE ) 220 MG tablet Take 220 mg by mouth daily as needed.     spironolactone  (ALDACTONE ) 25 MG tablet Take 1 tablet (25 mg total) by mouth daily. 90 tablet 3   Turmeric 500 MG CAPS Take 1 tablet by mouth as needed (Takes occassionally).     COLLAGEN PO Take 1 tablet by mouth daily. (Patient not taking: Reported on 06/29/2024)     No current facility-administered medications for this visit.     Assessment & Plan       Essential hypertension Assessment: BP is controlled in office BP 124/76 mmHg;  above the goal (<130/80). Tolerates clonidine  0.05 mg in am/0.1 mg in pm, hydrochlorothiazide  12.5 mg daily, spironolactone  25 mg daily, hydralazine  150 mg bid well, without any side effects Denies SOB, palpitation, chest pain, headaches,or swelling Reiterated the importance of regular exercise and low salt diet   Plan:  Continue taking clonidine  0.05 mg in am/0.1 mg in pm, hydrochlorothiazide  12.5 mg daily, spironolactone  25 mg daily, hydralazine  150 mg bid Patient to keep record of BP readings with heart rate and report to us  at the next visit Patient to follow up with Dr. Raford in 9 months  Labs ordered today: none  Mixed hyperlipidemia Assessment: Patient with ASCVD not at LDL goal of < 70 Most recent LDL 140 on 7/25 Not able to tolerate statins  and ezetimibe  secondary to myalgias   Reviewed options for lowering LDL cholesterol, including PCSK-9 inhibitors and inclisiran.  Patient insurance denied Leqvio without trial of PCSK-9.  Reviewed medication information and assured her medication is widely used in cardiology with very few side effects.    Plan: Patient agreeable to starting Repatha 140 mg q14d _[facility] [address] Repeat labs after:  3 months Lipid Liver function Patient was given information on [employer] - will sign patient up when PA approved Marital status Income < $102,000 (married)   Allean Mink PharmD CPP [facility]  [address] [place], [place] [address] [phone]

## 2024-06-29 NOTE — Assessment & Plan Note (Signed)
 Assessment: Patient with ASCVD not at LDL goal of < 70 Most recent LDL 140 on 7/25 Not able to tolerate statins and ezetimibe  secondary to myalgias   Reviewed options for lowering LDL cholesterol, including PCSK-9 inhibitors and inclisiran.  Patient insurance denied Leqvio without trial of PCSK-9.  Reviewed medication information and assured her medication is widely used in cardiology with very few side effects.    Plan: Patient agreeable to starting Repatha 140 mg q14d _[facility] [address] Repeat labs after:  3 months Lipid Liver function Patient was given information on [employer] - will sign patient up when PA approved Marital status Income < $102,000 (married)

## 2024-06-29 NOTE — Assessment & Plan Note (Signed)
 Assessment: BP is controlled in office BP 124/76 mmHg;  above the goal (<130/80). Tolerates clonidine  0.05 mg in am/0.1 mg in pm, hydrochlorothiazide  12.5 mg daily, spironolactone  25 mg daily, hydralazine  150 mg bid well, without any side effects Denies SOB, palpitation, chest pain, headaches,or swelling Reiterated the importance of regular exercise and low salt diet   Plan:  Continue taking clonidine  0.05 mg in am/0.1 mg in pm, hydrochlorothiazide  12.5 mg daily, spironolactone  25 mg daily, hydralazine  150 mg bid Patient to keep record of BP readings with heart rate and report to us  at the next visit Patient to follow up with Dr. Raford in 9 months  Labs ordered today: none

## 2024-06-29 NOTE — Patient Instructions (Signed)
 Follow up appointment: 9 month with Dr. Raford  Take your BP meds as follows:  Continue with current BP medications  Start Repatha 140 mg every 14 days.   We will reach out after 4-6 doses to repeat cholesterol labs.  Check your blood pressure at home daily (if able) and keep record of the readings.  Your blood pressure goal is < 130/80  To check your pressure at home you will need to:  1. Sit up in a chair, with feet flat on the floor and back supported. Do not cross your ankles or legs. 2. Rest your left arm so that the cuff is about heart level. If the cuff goes on your upper arm,  then just relax the arm on the table, arm of the chair or your lap. If you have a wrist cuff, we  suggest relaxing your wrist against your chest (think of it as Pledging the Flag with the  wrong arm).  3. Place the cuff snugly around your arm, about 1 inch above the crook of your elbow. The  cords should be inside the groove of your elbow.  4. Sit quietly, with the cuff in place, for about 5 minutes. After that 5 minutes press the power  button to start a reading. 5. Do not talk or move while the reading is taking place.  6. Record your readings on a sheet of paper. Although most cuffs have a memory, it is often  easier to see a pattern developing when the numbers are all in front of you.  7. You can repeat the reading after 1-3 minutes if it is recommended  Make sure your bladder is empty and you have not had caffeine or tobacco within the last 30 min  Always bring your blood pressure log with you to your appointments. If you have not brought your monitor in to be double checked for accuracy, please bring it to your next appointment.  You can find a list of quality blood pressure cuffs at [email or web address]  Important lifestyle changes to control high blood pressure  Intervention  Effect on the BP  Lose extra pounds and watch your waistline Weight loss is one of the most effective lifestyle changes  for controlling blood pressure. If you're overweight or obese, losing even a small amount of weight can help reduce blood pressure. Blood pressure might go down by about 1 millimeter of mercury (mm Hg) with each kilogram (about 2.2 pounds) of weight lost.  Exercise regularly As a general goal, aim for at least 30 minutes of moderate physical activity every day. Regular physical activity can lower high blood pressure by about 5 to 8 mm Hg.  Eat a healthy diet Eating a diet rich in whole grains, fruits, vegetables, and low-fat dairy products and low in saturated fat and cholesterol. A healthy diet can lower high blood pressure by up to 11 mm Hg.  Reduce salt (sodium) in your diet Even a small reduction of sodium in the diet can improve heart health and reduce high blood pressure by about 5 to 6 mm Hg.  Limit alcohol One drink equals 12 ounces of beer, 5 ounces of wine, or 1.5 ounces of 80-proof liquor.  Limiting alcohol to less than one drink a day for women or two drinks a day for men can help lower blood pressure by about 4 mm Hg.   If you have any questions or concerns please use My Chart to send questions or call the office at [phone]

## 2024-07-02 ENCOUNTER — Ambulatory Visit: Payer: Self-pay | Admitting: Cardiovascular Disease

## 2024-07-03 ENCOUNTER — Telehealth (INDEPENDENT_AMBULATORY_CARE_PROVIDER_SITE_OTHER): Payer: Self-pay | Admitting: Otolaryngology

## 2024-07-03 ENCOUNTER — Encounter (INDEPENDENT_AMBULATORY_CARE_PROVIDER_SITE_OTHER): Payer: Self-pay

## 2024-07-03 NOTE — Telephone Encounter (Signed)
 Returned patient's call regarding scheduling an appt.  LVM and Sent MyChart msg requesting a call back.

## 2024-07-06 NOTE — Telephone Encounter (Signed)
 Lilliam has been scheduled for 9/19 at 10:00 am with Dr. Soldatova.

## 2024-07-11 ENCOUNTER — Ambulatory Visit: Admitting: Surgical

## 2024-07-11 ENCOUNTER — Encounter: Payer: Self-pay | Admitting: Surgical

## 2024-07-11 DIAGNOSIS — M1711 Unilateral primary osteoarthritis, right knee: Secondary | ICD-10-CM

## 2024-07-11 DIAGNOSIS — M170 Bilateral primary osteoarthritis of knee: Secondary | ICD-10-CM

## 2024-07-11 DIAGNOSIS — M1712 Unilateral primary osteoarthritis, left knee: Secondary | ICD-10-CM

## 2024-07-11 MED ORDER — LIDOCAINE HCL 1 % IJ SOLN
5.0000 mL | INTRAMUSCULAR | Status: AC | PRN
  Administered 2024-07-11: 5 mL

## 2024-07-11 MED ORDER — TRIAMCINOLONE ACETONIDE 40 MG/ML IJ SUSP
40.0000 mg | INTRAMUSCULAR | Status: AC | PRN
  Administered 2024-07-11: 40 mg via INTRA_ARTICULAR

## 2024-07-11 MED ORDER — BUPIVACAINE HCL 0.25 % IJ SOLN
4.0000 mL | INTRAMUSCULAR | Status: AC | PRN
  Administered 2024-07-11: 4 mL via INTRA_ARTICULAR

## 2024-07-11 NOTE — Progress Notes (Signed)
 Office Visit Note   Patient: Deborah [name]           Date of Birth: [birth year]           MRN: [id number] Visit Date: 07/11/2024 Requested by: Georgina Speaks, FNP [address] [place],  [place] [address] PCP: Georgina Speaks, FNP  Subjective: Chief Complaint  Patient presents with   Left Knee - Pain   Right Knee - Pain    HPI: Deborah [name] is a 73 y.o. female who presents to the office reporting knee pain.  Has history of knee arthritis.  No new falls or injuries.  No fevers or chills.  Previous injections have provided good relief and they are here today to repeat injections.  These injections provide relief for about 1 to 2 months on average.  Last injection was 03/07/2024..                ROS: All systems reviewed are negative as they relate to the chief complaint within the history of present illness.  Patient denies fevers or chills.  Assessment & Plan: Visit Diagnoses:  1. Unilateral primary osteoarthritis, left knee   2. Unilateral primary osteoarthritis, right knee     Plan: Plan for repeat bilateral cortisone injections today.  We did discuss other options available to patient.  Gel injections have not been helpful for her in the past.  Discussed the possibility of trying PRP injections but currently she is not interested in that.  She is considering knee replacement in the future, which multiple family members have had success with.  Still would like to proceed with cortisone injections today and understands that we cannot do any sort of knee arthroplasty for 3 months after this injection.  Tolerated both injections well.  She will talk with her family about their experience with knee replacement and consider further discussion at her next visit.  Follow-Up Instructions: No follow-ups on file.   Orders:  No orders of the defined types were placed in this encounter.  No orders of the defined types were placed in this encounter.      Procedures: Large Joint Inj: bilateral knee on 07/11/2024 11:31 AM Indications: diagnostic evaluation, joint swelling and pain Details: 18 G 1.5 in needle, superolateral approach  Arthrogram: No  Medications (Right): 5 mL lidocaine  1 %; 4 mL bupivacaine  0.25 %; 40 mg triamcinolone  acetonide 40 MG/ML Medications (Left): 5 mL lidocaine  1 %; 4 mL bupivacaine  0.25 %; 40 mg triamcinolone  acetonide 40 MG/ML Outcome: tolerated well, no immediate complications Procedure, treatment alternatives, risks and benefits explained, specific risks discussed. Consent was given by the patient. Immediately prior to procedure a time out was called to verify the correct patient, procedure, equipment, support staff and site/side marked as required. Patient was prepped and draped in the usual sterile fashion.       Clinical Data: No additional findings.  Objective: Vital Signs: There were no vitals taken for this visit.  Physical Exam:  Constitutional: Patient appears well-developed HEENT:  Head: Normocephalic Eyes:EOM are normal Neck: Normal range of motion Cardiovascular: Normal rate Pulmonary/chest: Effort normal Neurologic: Patient is alert Skin: Skin is warm Psychiatric: Patient has normal mood and affect  Ortho Exam: Ortho exam demonstrates knees without cellulitis or skin changes.  Effusion not present in either knee.  No calf tenderness.  Negative Homans' sign.  No pain with hip range of motion.  Able to perform straight leg raise with both lower extremities.  Lower extremities warm  and well-perfused.  Tender over the medial and lateral joint lines of both knees.  Specialty Comments:  No specialty comments available.  Imaging: No results found.   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/15/2023   Atherosclerosis of aorta (HCC) 10/13/2023   COVID-19 vaccination declined 06/14/2023   BMI 30.0-30.9,adult 01/20/2023   Obesity, Beginning BMI 35.02 01/20/2023   Essential hypertension 07/05/2022   Type 2 diabetes mellitus without complication, without long-term current use of insulin  (HCC) 07/05/2022   Mixed hyperlipidemia 03/04/2022   Insomnia 04/30/2019   Seasonal allergies 03/27/2019   Prediabetes 01/11/2019   Hypothyroidism 01/11/2019   Vitamin D  deficiency 01/11/2019   Upper airway cough syndrome 01/11/2019   Fatigue 01/11/2019   Menopause 06/08/2011   Past Medical History:  Diagnosis Date   Back pain    Death of child 51   Due to cord strangulation.   Diabetes mellitus without complication (HCC)    Hyperlipidemia    Hypertension    Hypothyroidism    Joint pain    Nonobstructive atherosclerosis of coronary artery    Osteoarthritis    Other fatigue    Shortness of breath on exertion    Thyroid  condition     Family History  Problem Relation Age of Onset   COPD Mother    Diabetes Mother    Hypertension Mother    Obesity Mother    Heart disease Father        died of sudden MI   Hypertension Father    Glaucoma Father     Past Surgical History:  Procedure Laterality Date   APPENDECTOMY  1974   CARDIAC CATHETERIZATION     two, dates not provided.   CHOLECYSTECTOMY     confirm date with patient, was it 1?   LAPAROSCOPIC TUBAL LIGATION  1987   THYROIDECTOMY     Social History   Occupational History   Occupation: retired    Associate Professor:  [employer]  Tobacco Use   Smoking status: Never    Passive exposure: Past   Smokeless tobacco: Never  Vaping Use   Vaping status: Never Used  Substance and Sexual Activity   Alcohol use: Not Currently   Drug use: No   Sexual activity: Not Currently

## 2024-07-12 ENCOUNTER — Telehealth: Payer: Self-pay | Admitting: Pharmacy Technician

## 2024-07-12 ENCOUNTER — Other Ambulatory Visit (HOSPITAL_COMMUNITY): Payer: Self-pay

## 2024-07-12 ENCOUNTER — Encounter (HOSPITAL_BASED_OUTPATIENT_CLINIC_OR_DEPARTMENT_OTHER): Payer: Self-pay | Admitting: Cardiovascular Disease

## 2024-07-12 ENCOUNTER — Other Ambulatory Visit: Payer: Self-pay | Admitting: Nurse Practitioner

## 2024-07-12 ENCOUNTER — Telehealth: Payer: Self-pay | Admitting: Pharmacist Clinician (PhC)/ Clinical Pharmacy Specialist

## 2024-07-12 ENCOUNTER — Encounter: Payer: Self-pay | Admitting: Nurse Practitioner

## 2024-07-12 ENCOUNTER — Other Ambulatory Visit: Payer: Self-pay

## 2024-07-12 DIAGNOSIS — E1169 Type 2 diabetes mellitus with other specified complication: Secondary | ICD-10-CM

## 2024-07-12 DIAGNOSIS — R058 Other specified cough: Secondary | ICD-10-CM

## 2024-07-12 DIAGNOSIS — E782 Mixed hyperlipidemia: Secondary | ICD-10-CM

## 2024-07-12 MED ORDER — HYDROCODONE BIT-HOMATROP MBR 5-1.5 MG/5ML PO SOLN
5.0000 mL | Freq: Four times a day (QID) | ORAL | 0 refills | Status: DC | PRN
Start: 2024-07-12 — End: 2024-08-22

## 2024-07-12 MED ORDER — ACCU-CHEK GUIDE TEST VI STRP: ORAL_STRIP | 12 refills | Status: AC

## 2024-07-12 MED ORDER — ACCU-CHEK FASTCLIX LANCETS MISC: 2 refills | Status: AC

## 2024-07-12 MED ORDER — ACCU-CHEK AVIVA PLUS W/DEVICE KIT: PACK | 0 refills | Status: AC

## 2024-07-12 NOTE — Telephone Encounter (Signed)
 From pt calls  Pharmacy Patient Advocate Encounter   Received notification from Pt Calls Messages that prior authorization for repatha is required/requested.   Insurance verification completed.   The patient is insured through [facility] .   Per test claim: PA required; PA submitted to above mentioned insurance via latent Key/confirmation #/EOC [id number] Status is pending

## 2024-07-12 NOTE — Telephone Encounter (Signed)
 Please do PA for Repatha

## 2024-07-12 NOTE — Telephone Encounter (Signed)
 Prior auth started.  Will reach out once approved and let patient know.  She is aware

## 2024-07-12 NOTE — Telephone Encounter (Signed)
 Pharmacy Patient Advocate Encounter  Received notification from [employer] that Prior Authorization for repatha has been APPROVED from 07/12/24 to 01/12/25. Ran test claim, Copay is $302.00. This test claim was processed through [facility]- copay amounts may vary at other pharmacies due to pharmacy/plan contracts, or as the patient moves through the different stages of their insurance plan.   PA #/Case ID/Reference #: [id number]

## 2024-07-15 ENCOUNTER — Other Ambulatory Visit (HOSPITAL_BASED_OUTPATIENT_CLINIC_OR_DEPARTMENT_OTHER): Payer: Self-pay | Admitting: Cardiovascular Disease

## 2024-07-15 DIAGNOSIS — I10 Essential (primary) hypertension: Secondary | ICD-10-CM

## 2024-07-17 ENCOUNTER — Encounter: Payer: Self-pay | Admitting: Nurse Practitioner

## 2024-07-17 ENCOUNTER — Ambulatory Visit (INDEPENDENT_AMBULATORY_CARE_PROVIDER_SITE_OTHER): Admitting: Nurse Practitioner

## 2024-07-17 VITALS — BP 120/70 | HR 79 | Temp 98.7°F | Ht 62.6 in | Wt 183.0 lb

## 2024-07-17 DIAGNOSIS — E785 Hyperlipidemia, unspecified: Secondary | ICD-10-CM

## 2024-07-17 DIAGNOSIS — E1169 Type 2 diabetes mellitus with other specified complication: Secondary | ICD-10-CM | POA: Insufficient documentation

## 2024-07-17 DIAGNOSIS — Z79899 Other long term (current) drug therapy: Secondary | ICD-10-CM

## 2024-07-17 DIAGNOSIS — E66811 Obesity, class 1: Secondary | ICD-10-CM | POA: Insufficient documentation

## 2024-07-17 DIAGNOSIS — Z789 Other specified health status: Secondary | ICD-10-CM | POA: Insufficient documentation

## 2024-07-17 DIAGNOSIS — E039 Hypothyroidism, unspecified: Secondary | ICD-10-CM

## 2024-07-17 DIAGNOSIS — Z6832 Body mass index (BMI) 32.0-32.9, adult: Secondary | ICD-10-CM

## 2024-07-17 DIAGNOSIS — I119 Hypertensive heart disease without heart failure: Secondary | ICD-10-CM

## 2024-07-17 DIAGNOSIS — I700 Atherosclerosis of aorta: Secondary | ICD-10-CM | POA: Diagnosis not present

## 2024-07-17 DIAGNOSIS — E6609 Other obesity due to excess calories: Secondary | ICD-10-CM

## 2024-07-17 MED ORDER — REPATHA SURECLICK 140 MG/ML ~~LOC~~ SOAJ: 140.0000 mg | SUBCUTANEOUS | 3 refills | Status: AC

## 2024-07-17 NOTE — Assessment & Plan Note (Signed)
 HgbA1c is stable and in prediabetes range, continue current medications.

## 2024-07-17 NOTE — Assessment & Plan Note (Signed)
 She is encouraged to strive for BMI less than 30 to decrease cardiac risk. Advised to aim for at least 150 minutes of exercise per week.

## 2024-07-17 NOTE — Assessment & Plan Note (Addendum)
 She is not taking her statin at this time, she was unable to tolerate statin, she is to try Repatha  from Dr. Raford

## 2024-07-17 NOTE — Addendum Note (Signed)
 Addended by: Domini Vandehei L on: 07/17/2024 06:59 AM   Modules accepted: Orders

## 2024-07-17 NOTE — Assessment & Plan Note (Signed)
 Thyroid levels are normal.  Continue current medications.  Will check thyroid levels today.

## 2024-07-17 NOTE — Assessment & Plan Note (Signed)
 She is to start Repatha  this week, the prescription has just been sent in

## 2024-07-17 NOTE — Patient Instructions (Signed)

## 2024-07-17 NOTE — Assessment & Plan Note (Signed)
 Blood pressure is well controlled.  Encouraged to focus on lifestyle modifications.

## 2024-07-17 NOTE — Progress Notes (Signed)
 I,Jameka J Llittleton, CMA,acting as a Neurosurgeon for [employer], FNP.,have documented all relevant documentation on the behalf of Gaines Ada, FNP,as directed by  Gaines Ada, FNP while in the presence of Gaines Ada, FNP.  Subjective:  Patient ID: Deborah [name] , female    DOB: March 31, 1951 , 73 y.o.   MRN: [id number]  Chief Complaint  Patient presents with   Diabetes    Patient presents today for a chol and dm follow up, Patient reports compliance with medication. Patient denies any chest pain, SOB, or headaches. Patient has no concerns today.    Hypertension    HPI  She has seen a pharmacist at [facility] to start Repatha . She does have an appt with ENT next month for the persistent cough. She had bilateral knee injection with cortisone. She has an appt in December for her eye exam  Diabetes She presents for her initial diabetic visit. She has type 2 diabetes mellitus. Her disease course has been stable. There are no hypoglycemic associated symptoms. Pertinent negatives for hypoglycemia include no confusion, dizziness or headaches. There are no diabetic associated symptoms. Pertinent negatives for diabetes include no blurred vision, no chest pain, no fatigue, no polydipsia, no polyphagia and no polyuria. There are no hypoglycemic complications. Symptoms are stable. Diabetic complications include heart disease. Risk factors for coronary artery disease include obesity, sedentary lifestyle, hypertension, post-menopausal, dyslipidemia and diabetes mellitus (prediabetes). Current diabetic treatment includes oral agent (monotherapy). She is compliant with treatment most of the time. Her weight is decreasing steadily. She is following a generally healthy diet. Diabetic meal planning: continues to eat a healthy diet she is not snacking a lot - eats 1-2 meals a day. She has had a previous visit with a dietitian. She participates in exercise intermittently (mostly with walking with work (at least  one day a week)and was going to [employer] from time to time). There is no change in her home blood glucose trend. (Blood sugar ranges 100-118 - checks daily) An ACE inhibitor/angiotensin II receptor blocker is being taken. She does not see a podiatrist.Eye exam is not current (she has an appt in December 2025).     Past Medical History:  Diagnosis Date   Back pain    Death of child 74   Due to cord strangulation.   Diabetes mellitus without complication (HCC)    Hyperlipidemia    Hypertension    Hypothyroidism    Joint pain    Nonobstructive atherosclerosis of coronary artery    Osteoarthritis    Other fatigue    Shortness of breath on exertion    Thyroid  condition      Family History  Problem Relation Age of Onset   COPD Mother    Diabetes Mother    Hypertension Mother    Obesity Mother    Heart disease Father        died of sudden MI   Hypertension Father    Glaucoma Father      Current Outpatient Medications:    Accu-Chek FastClix Lancets MISC, Use daily to check blood sugars., Disp: 100 each, Rfl: 2   acetaminophen  (TYLENOL ) 500 MG tablet, Take 1,000 mg by mouth at bedtime., Disp: , Rfl:    aspirin EC 81 MG tablet, Take 81 mg by mouth daily. Swallow whole., Disp: , Rfl:    Blood Glucose Monitoring Suppl (ACCU-CHEK AVIVA PLUS) w/Device KIT, Use daily to check blood sugars., Disp: 1 kit, Rfl: 0   cholecalciferol (VITAMIN D ) 1000 units tablet,  Take 2,000 Units by mouth daily., Disp: , Rfl:    cloNIDine  (CATAPRES ) 0.1 MG tablet, Please take a half tablet in the morning and a whole tablet in the evening ( 12 hours apart), Disp: 180 tablet, Rfl: 3   COLLAGEN PO, Take 1 tablet by mouth daily., Disp: , Rfl:    cyclobenzaprine  (FLEXERIL ) 10 MG tablet, Take 1 tablet (10 mg total) by mouth 2 (two) times daily as needed for muscle spasms., Disp: 20 tablet, Rfl: 0   diclofenac  Sodium (VOLTAREN ) 1 % GEL, Apply 4 g topically 4 (four) times daily., Disp: 100 g, Rfl: 0    docusate sodium (COLACE) 100 MG capsule, Take 100 mg by mouth daily., Disp: , Rfl:    Evolocumab  (REPATHA  SURECLICK) 140 MG/ML SOAJ, Inject 140 mg into the skin every 14 (fourteen) days., Disp: 6 mL, Rfl: 3   glucose blood (ACCU-CHEK GUIDE TEST) test strip, Use as instructed, Disp: 100 each, Rfl: 12   hydrALAZINE  (APRESOLINE ) 100 MG tablet, TAKE 1 AND 1/2 TABLETS BY MOUTH  TWICE DAILY, Disp: 300 tablet, Rfl: 2   hydrochlorothiazide  (HYDRODIURIL ) 12.5 MG tablet, TAKE 2 TABLETS BY MOUTH DAILY., Disp: 45 tablet, Rfl: 3   HYDROcodone  bit-homatropine (HYCODAN) 5-1.5 MG/5ML syrup, Take 5 mLs by mouth every 6 (six) hours as needed for cough., Disp: 120 mL, Rfl: 0   ipratropium (ATROVENT ) 0.03 % nasal spray, Place 1 spray into both nostrils at bedtime as needed for rhinitis., Disp: 30 mL, Rfl: 12   levothyroxine  (SYNTHROID ) 88 MCG tablet, Take 1 tablet (88 mcg total) by mouth daily before breakfast., Disp: 100 tablet, Rfl: 1   MAGNESIUM  PO, Take 375 mg by mouth daily., Disp: , Rfl:    Melatonin 10 MG CAPS, Take 10 mg by mouth at bedtime., Disp: , Rfl:    metFORMIN  (GLUCOPHAGE -XR) 500 MG 24 hr tablet, Take 1 tablet (500 mg total) by mouth 2 (two) times daily., Disp: 180 tablet, Rfl: 1   naproxen  sodium (ALEVE ) 220 MG tablet, Take 220 mg by mouth daily as needed., Disp: , Rfl:    spironolactone  (ALDACTONE ) 25 MG tablet, Take 1 tablet (25 mg total) by mouth daily., Disp: 90 tablet, Rfl: 3   Turmeric 500 MG CAPS, Take 1 tablet by mouth as needed (Takes occassionally)., Disp: , Rfl:    Allergies  Allergen Reactions   Amlodipine     headache   Aspartame And Phenylalanine Other (See Comments)    Causes migraines   Bidil [Isosorb Dinitrate-Hydralazine ]     headache   Elavil  [Amitriptyline ] Other (See Comments)    Loss of balance, hallucinations   Erythromycin     Ask patient to specify type/severity/reaction. Not indicated on medical history form dated 11/04/10.   Fentanyl      Causes panic attacks    Ibuprofen     Ask patient to specify type/severity/reaction. Not noted on medical history form dated 11/04/10.   Lisinopril Cough   Metoprolol  Other (See Comments)   Phenylalanine Other (See Comments)    Causes migraine   Prilosec [Omeprazole] Nausea And Vomiting   Rosuvastatin  Other (See Comments)   Tramadol  Nausea And Vomiting   Valsartan Cough   Verapamil Nausea And Vomiting   Zantac [Ranitidine Hcl]    Zetia  [Ezetimibe ]     myalgias     Review of Systems  Constitutional: Negative.  Negative for fatigue.  Eyes: Negative.  Negative for blurred vision.  Cardiovascular:  Negative for chest pain.  Endocrine: Negative for polydipsia, polyphagia and polyuria.  Musculoskeletal: Negative.   Skin: Negative.   Neurological:  Negative for dizziness and headaches.  Psychiatric/Behavioral: Negative.  Negative for confusion.      Today's Vitals   07/17/24 0822  BP: 120/70  Pulse: 79  Temp: 98.7 F (37.1 C)  TempSrc: Oral  Weight: 183 lb (83 kg)  Height: 5' 2.6 (1.59 m)  PainSc: 0-No pain   Body mass index is 32.83 kg/m.  Wt Readings from Last 3 Encounters:  07/17/24 183 lb (83 kg)  06/29/24 186 lb 9.6 oz (84.6 kg)  06/04/24 184 lb 14.4 oz (83.9 kg)      Objective:  Physical Exam Vitals and nursing note reviewed.  Constitutional:      General: She is not in acute distress.    Appearance: Normal appearance.  Cardiovascular:     Rate and Rhythm: Normal rate and regular rhythm.     Pulses: Normal pulses.     Heart sounds: Normal heart sounds. No murmur heard. Pulmonary:     Effort: Pulmonary effort is normal. No respiratory distress.     Breath sounds: Normal breath sounds. No wheezing.  Skin:    Capillary Refill: Capillary refill takes less than 2 seconds.  Neurological:     General: No focal deficit present.     Mental Status: She is alert and oriented to person, place, and time.     Cranial Nerves: No cranial nerve deficit.     Motor: No weakness.  Psychiatric:         Mood and Affect: Mood normal.        Behavior: Behavior normal.        Thought Content: Thought content normal.        Judgment: Judgment normal.         Assessment And Plan:  Type 2 diabetes mellitus with hyperlipidemia (HCC) Assessment & Plan: HgbA1c is stable and in prediabetes range, continue current medications.   Orders: -     Hemoglobin A1c -     Microalbumin / creatinine urine ratio  Hypertensive heart disease without heart failure Assessment & Plan: Blood pressure is well controlled.  Encouraged to focus on lifestyle modifications   Acquired hypothyroidism Assessment & Plan: Thyroid  levels are normal.  Continue current medications.  Will check thyroid  levels today.  Orders: -     TSH + free T4  Atherosclerosis of aorta (HCC) Assessment & Plan: She is not taking her statin at this time, she was unable to tolerate statin, she is to try Repatha  from Dr. Raford  Orders: -     CBC  Statin intolerance Assessment & Plan: She is to start Repatha  this week, the prescription has just been sent in    Class 1 obesity due to excess calories without serious comorbidity with body mass index (BMI) of 32.0 to 32.9 in adult Assessment & Plan: She is encouraged to strive for BMI less than 30 to decrease cardiac risk. Advised to aim for at least 150 minutes of exercise per week.    Other long term (current) drug therapy -     CBC    Return if symptoms worsen or fail to improve.  Patient was given opportunity to ask questions. Patient verbalized understanding of the plan and was able to repeat key elements of the plan. All questions were answered to their satisfaction.    LILLETTE Gaines Ada, FNP, have reviewed all documentation for this visit. The documentation on 07/17/24 for the exam, diagnosis, procedures, and orders are all accurate  and complete.   IF YOU HAVE BEEN REFERRED TO A SPECIALIST, IT MAY TAKE 1-2 WEEKS TO SCHEDULE/PROCESS THE REFERRAL. IF YOU HAVE  NOT HEARD FROM US /SPECIALIST IN TWO WEEKS, PLEASE GIVE US  A CALL AT [phone].

## 2024-07-18 LAB — MICROALBUMIN / CREATININE URINE RATIO
Creatinine, Urine: 72.5 mg/dL
Microalb/Creat Ratio: 7 mg/g{creat} (ref 0–29)
Microalbumin, Urine: 5.2 ug/mL

## 2024-07-18 LAB — CBC
Hematocrit: 35.7 % (ref 34.0–46.6)
Hemoglobin: 11.2 g/dL (ref 11.1–15.9)
MCH: 28.4 pg (ref 26.6–33.0)
MCHC: 31.4 g/dL — ABNORMAL LOW (ref 31.5–35.7)
MCV: 90 fL (ref 79–97)
Platelets: 478 x10E3/uL — ABNORMAL HIGH (ref 150–450)
RBC: 3.95 x10E6/uL (ref 3.77–5.28)
RDW: 12.3 % (ref 11.7–15.4)
WBC: 6.1 x10E3/uL (ref 3.4–10.8)

## 2024-07-18 LAB — HEMOGLOBIN A1C
Est. average glucose Bld gHb Est-mCnc: 123 mg/dL
Hgb A1c MFr Bld: 5.9 % — ABNORMAL HIGH (ref 4.8–5.6)

## 2024-07-18 LAB — TSH+FREE T4
Free T4: 1.69 ng/dL (ref 0.82–1.77)
TSH: 2.38 u[IU]/mL (ref 0.450–4.500)

## 2024-07-26 ENCOUNTER — Other Ambulatory Visit: Payer: Self-pay | Admitting: Nurse Practitioner

## 2024-07-26 DIAGNOSIS — E119 Type 2 diabetes mellitus without complications: Secondary | ICD-10-CM

## 2024-08-16 ENCOUNTER — Encounter: Payer: Self-pay | Admitting: Nurse Practitioner

## 2024-08-22 ENCOUNTER — Other Ambulatory Visit: Payer: Self-pay | Admitting: Nurse Practitioner

## 2024-08-22 DIAGNOSIS — R058 Other specified cough: Secondary | ICD-10-CM

## 2024-08-22 MED ORDER — HYDROCODONE BIT-HOMATROP MBR 5-1.5 MG/5ML PO SOLN: 5.0000 mL | Freq: Four times a day (QID) | ORAL | 0 refills | Status: DC | PRN

## 2024-08-25 ENCOUNTER — Other Ambulatory Visit: Payer: Self-pay | Admitting: Nurse Practitioner

## 2024-08-25 DIAGNOSIS — I10 Essential (primary) hypertension: Secondary | ICD-10-CM

## 2024-09-07 ENCOUNTER — Institutional Professional Consult (permissible substitution) (INDEPENDENT_AMBULATORY_CARE_PROVIDER_SITE_OTHER): Admitting: Otolaryngology

## 2024-09-10 ENCOUNTER — Encounter: Payer: Self-pay | Admitting: Internal Medicine

## 2024-09-10 ENCOUNTER — Other Ambulatory Visit: Payer: Self-pay

## 2024-09-10 ENCOUNTER — Ambulatory Visit: Admitting: Internal Medicine

## 2024-09-10 VITALS — BP 118/78 | HR 81 | Temp 97.9°F | Resp 18

## 2024-09-10 DIAGNOSIS — J310 Chronic rhinitis: Secondary | ICD-10-CM | POA: Diagnosis not present

## 2024-09-10 DIAGNOSIS — R058 Other specified cough: Secondary | ICD-10-CM

## 2024-09-10 DIAGNOSIS — Z789 Other specified health status: Secondary | ICD-10-CM

## 2024-09-10 NOTE — Telephone Encounter (Signed)
 Whittney has been referred to [facility] on [facility] here in [place].  They will reach out to the patient to schedule.  I will follow up in a week.

## 2024-09-10 NOTE — Patient Instructions (Addendum)
 Chronic cough: upper airway cough syndrome. Nonallergic rhinitis Previous treatments ineffective or intolerable. Limited options due to medication intolerances. Hast tried and failed medications for GERD, antihistamines, multiple inhalers. Responsive only to hycodan and briefly to steroids and antibiotics. No longer following with pulmonary. Has never been evaluated by ENT. Interval Hx: atrovent  somewhat helpful but did not tolerate due to headaches; missed ENT appt and requesting closer ENT office - environmental allergy testing negative - Refer to ENT for further evaluation. Will send to ENT on [facility] per patient request. - Consider atrovent  nasal spray - try spacing out every couple of days if unable to tolerate daily. - Advise use of nasal saline spray before Atrovent  to hydrate and loosen mucus. - Ensure acid reflux is controlled; follow-up with GI  Discussed that we are running out of treatment options given her medication intolerances. Will refer to ENT, but she has nonallergic rhinitis and is not a candidate for allergy injections.  It is important to manage any drainage or reflux she is having which can contribute to symptoms. She should continue to follow-up with pulmonary who originally referred her for allergy testing which has since been negative.   Medication intolerance Intolerance to multiple medications limits treatment options for chronic cough and other conditions.      Follow up : as needed It was a pleasure meeting you in clinic today! Thank you for allowing me to participate in your care.

## 2024-09-10 NOTE — Progress Notes (Signed)
 FOLLOW UP Date of Service/Encounter:   09/10/2024  Subjective:  Deborah [name] (DOB: [birth year]) is a 73 y.o. female who [facility] in re-evaluation of the following: upper airway cough History obtained from: chart review and patient.  For Review, LV was on 06/04/24  with Dr.Cambren Helm seen for intial visit for upper airway cough. See below for summary of history and diagnostics.   Therapeutic plans/changes recommended: we started atrovent , advised her to follow-up with Gi and referred to ENT ----------------------------------------------------- Pertinent History/Diagnostics:  Chronic cough-Upper Airway Cough Syndrome Nonallergic rhinitis Chronic cough exacerbated by seasonal pollen, possibly due to allergies, nerve damage, or reflux. Previous treatments ineffective or intolerable. Limited options due to medication intolerances. Hast tried and failed medications for GERD, antihistamines, multiple inhalers. Responsive only to hycodan and briefly to steroids and antibiotics. No longer following with pulmonary. Has never been evaluated by ENT.  - allergy testing (environmental) panel negative - normal spirometry - trial of atrovent  prescribed at initial visit - previously followed by pulmonary: LV 02/22/23: Onset in her 20s/ no better with allergy shots, saba/ worse with laughter/perfume and severe to point of urinary incont and vomiting  - 11/19/2022 rx aciphex  and 1st gen H1 blockers per guidelines  > no better 01/11/2023  - 01/11/2023 added elavil  at hs  - Sinus ct  01/24/23  c/w bubbly secretions > ent eval if not improving  - FENO  01/11/2023 =  16  - 02/22/2023 referred to ENT ([facility]) with gabapentin  trial in meantime  and double aciphex  to bid  ac - Per notes on 03/02/23 with Pulmonary: she was unable to tolerate gabapentin  and tried chlorpehniramine which she also reports she could not tolerate  - CXR 01/11/23: no active cardiopulmonary  disease Maxillofacilal CT 2024: 01/23/23: 1. Scattered mild paranasal sinus mucosal thickening and bubbly opacity does not appear significantly changed from a 2016 Head CT, along with an incidental appearing right sphenoid sinus mucous retention cyst.  2. Middle ears and mastoids remain clear 2022: CBCd: AEC 100 --------------------------------------------------- Today presents for follow-up. Discussed the use of AI scribe software for clinical note transcription with the patient, who gave verbal consent to proceed.  History of Present Illness Deborah [name] is a 73 year old female who presents with a persistent cough and medication side effects.  Chronic cough - Persistent cough not alleviated by previous treatments - Cough occurs regularly and sometimes disrupts sleep - Throat irritation present - Multiple nasal sprays have been tried without relief; nasal sprays cause choking and are not tolerated  Medication side effects - Atrovent  nasal spray initially provided some relief but now ineffective - Daily headaches occur, waking her every morning, attributed to Atrovent  nasal spray  Sleep disturbance - Sleep is generally poor - Cough sometimes disrupts sleep  Specialist evaluation - No evaluation by ear, nose, and throat (ENT) specialist due to scheduling conflicts and personal circumstances, including recent bereavement - Seeking ENT appointment closer to her location  Gastrointestinal evaluation - Colonoscopy performed by gastroenterologist - No discussion of reflux or GERD with gastroenterologist - No follow-up appointment scheduled with gastroenterologist   All medications reviewed by clinical staff and updated in chart. No new pertinent medical or surgical history except as noted in HPI.  ROS: All others negative except as noted per HPI.   Objective:  BP 118/78   Pulse 81   Temp 97.9 F (36.6 C)   Resp 18   SpO2 97%  There  is no height or weight on file  to calculate BMI. Physical Exam: General Appearance:  Alert, cooperative, no distress, appears stated age  Head:  Normocephalic, without obvious abnormality, atraumatic  Eyes:  Conjunctiva clear, EOM's intact  Ears EACs normal bilaterally and normal TMs bilaterally  Nose: Nares normal, normal mucosa and no visible anterior polyps  Throat: Lips, tongue normal; teeth and gums normal, normal posterior oropharynx  Neck: Supple, symmetrical  Lungs:   clear to auscultation bilaterally, Respirations unlabored, no coughing  Heart:  regular rate and rhythm and no murmur, Appears well perfused  Extremities: No edema  Skin: Skin color, texture, turgor normal and no rashes or lesions on visualized portions of skin  Neurologic: No gross deficits   Labs:  Lab Orders  No laboratory test(s) ordered today     Assessment/Plan   Chronic cough: upper airway cough syndrome. Nonallergic rhinitis Previous treatments ineffective or intolerable. Limited options due to medication intolerances. Hast tried and failed medications for GERD, antihistamines, multiple inhalers. Responsive only to hycodan and briefly to steroids and antibiotics. No longer following with pulmonary. Has never been evaluated by ENT. Interval Hx: atrovent  somewhat helpful but did not tolerate due to headaches; missed ENT appt and requesting closer ENT office - environmental allergy testing negative - Refer to ENT for further evaluation. Will send to ENT on [facility] per patient request. - Consider atrovent  nasal spray - try spacing out every couple of days if unable to tolerate daily. - Advise use of nasal saline spray before Atrovent  to hydrate and loosen mucus. - Ensure acid reflux is controlled; follow-up with GI  Discussed that we are running out of treatment options given her medication intolerances. Will refer to ENT, but she has nonallergic rhinitis and is not a candidate for allergy injections.  It is important to manage any  drainage or reflux she is having which can contribute to symptoms. She should continue to follow-up with pulmonary who originally referred her for allergy testing which has since been negative.   Medication intolerance Intolerance to multiple medications limits treatment options for chronic cough and other conditions.      Follow up : as needed It was a pleasure meeting you in clinic today! Thank you for allowing me to participate in your care.  Other: none  Rocky Endow, MD  [facility] 

## 2024-09-14 ENCOUNTER — Other Ambulatory Visit: Payer: Self-pay | Admitting: Nurse Practitioner

## 2024-09-14 DIAGNOSIS — I10 Essential (primary) hypertension: Secondary | ICD-10-CM

## 2024-09-20 ENCOUNTER — Encounter: Payer: Self-pay | Admitting: Nurse Practitioner

## 2024-09-24 ENCOUNTER — Encounter: Payer: Self-pay | Admitting: Nurse Practitioner

## 2024-09-25 ENCOUNTER — Telehealth: Payer: Self-pay | Admitting: Internal Medicine

## 2024-09-25 ENCOUNTER — Other Ambulatory Visit: Payer: Self-pay | Admitting: Nurse Practitioner

## 2024-09-25 DIAGNOSIS — R058 Other specified cough: Secondary | ICD-10-CM

## 2024-09-25 MED ORDER — HYDROCODONE BIT-HOMATROP MBR 5-1.5 MG/5ML PO SOLN
5.0000 mL | Freq: Four times a day (QID) | ORAL | 0 refills | Status: DC | PRN
Start: 2024-09-25 — End: 2024-10-31

## 2024-09-25 NOTE — Telephone Encounter (Signed)
 Deborah [name] has been scheduled with Dr. Vandegriend for 10/13 at 10:00 am.

## 2024-10-01 DIAGNOSIS — R053 Chronic cough: Secondary | ICD-10-CM | POA: Diagnosis not present

## 2024-10-01 DIAGNOSIS — J310 Chronic rhinitis: Secondary | ICD-10-CM | POA: Diagnosis not present

## 2024-10-12 ENCOUNTER — Other Ambulatory Visit (INDEPENDENT_AMBULATORY_CARE_PROVIDER_SITE_OTHER): Payer: Self-pay

## 2024-10-12 ENCOUNTER — Ambulatory Visit (INDEPENDENT_AMBULATORY_CARE_PROVIDER_SITE_OTHER): Admitting: Orthopedic Surgery

## 2024-10-12 ENCOUNTER — Other Ambulatory Visit: Payer: Self-pay | Admitting: Nurse Practitioner

## 2024-10-12 DIAGNOSIS — M25551 Pain in right hip: Secondary | ICD-10-CM | POA: Diagnosis not present

## 2024-10-12 DIAGNOSIS — M1712 Unilateral primary osteoarthritis, left knee: Secondary | ICD-10-CM

## 2024-10-12 DIAGNOSIS — M25552 Pain in left hip: Secondary | ICD-10-CM | POA: Diagnosis not present

## 2024-10-12 DIAGNOSIS — M170 Bilateral primary osteoarthritis of knee: Secondary | ICD-10-CM | POA: Diagnosis not present

## 2024-10-12 DIAGNOSIS — E039 Hypothyroidism, unspecified: Secondary | ICD-10-CM

## 2024-10-12 DIAGNOSIS — M1711 Unilateral primary osteoarthritis, right knee: Secondary | ICD-10-CM

## 2024-10-13 ENCOUNTER — Encounter: Payer: Self-pay | Admitting: Orthopedic Surgery

## 2024-10-13 MED ORDER — BUPIVACAINE HCL 0.25 % IJ SOLN
4.0000 mL | INTRAMUSCULAR | Status: AC | PRN
  Administered 2024-10-12: 4 mL via INTRA_ARTICULAR

## 2024-10-13 MED ORDER — TRIAMCINOLONE ACETONIDE 40 MG/ML IJ SUSP
40.0000 mg | INTRAMUSCULAR | Status: AC | PRN
  Administered 2024-10-12: 40 mg via INTRA_ARTICULAR

## 2024-10-13 MED ORDER — LIDOCAINE HCL 1 % IJ SOLN
5.0000 mL | INTRAMUSCULAR | Status: AC | PRN
  Administered 2024-10-12: 5 mL

## 2024-10-13 NOTE — Progress Notes (Signed)
 Office Visit Note   Patient: Deborah [name]           Date of Birth: [birth year]           MRN: [id number] Visit Date: 10/12/2024 Requested by: Georgina Speaks, FNP [address] [place],  [place] [address] PCP: Georgina Speaks, FNP  Subjective: Chief Complaint  Patient presents with   Other    Bilateral knee pain-    HPI: Deborah [name] is a 73 y.o. female who presents to the office reporting bilateral knee pain.  Had prior bilateral knee injections about 3 months ago.  They help enough that she would like to keep him going.  She also describes some right hip pain.  Describes pain both in the groin as well as in the buttock region.  Denies much in way of numbness and tingling or radicular symptoms.  That pain does affect her while she is walking.  Her knee pain however is more severe..                ROS: All systems reviewed are negative as they relate to the chief complaint within the history of present illness.  Patient denies fevers or chills.  Assessment & Plan: Visit Diagnoses:  1. Bilateral hip pain     Plan: Impression is bilateral knee pain and arthritis.  Bilateral injections performed today.  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  She may have some left hip early arthritis.  We will ultrasound and potentially inject that hip in about 2 weeks.  She will follow-up at that time for injection  Follow-Up Instructions: No follow-ups on file.   Orders:  Orders Placed This Encounter  Procedures   XR HIPS BILAT W OR W/O PELVIS 3-4 VIEWS   No orders of the defined types were placed in this encounter.     Procedures: Large Joint Inj: bilateral knee on 10/12/2024 7:15 PM Indications: diagnostic evaluation, joint swelling and pain Details: 18 G 1.5 in needle, superolateral approach  Arthrogram: No  Medications (Right): 5 mL lidocaine  1 %; 4 mL bupivacaine  0.25 %; 40 mg triamcinolone  acetonide 40 MG/ML Medications (Left): 5 mL  lidocaine  1 %; 4 mL bupivacaine  0.25 %; 40 mg triamcinolone  acetonide 40 MG/ML Outcome: tolerated well, no immediate complications Procedure, treatment alternatives, risks and benefits explained, specific risks discussed. Consent was given by the patient. Immediately prior to procedure a time out was called to verify the correct patient, procedure, equipment, support staff and site/side marked as required. Patient was prepped and draped in the usual sterile fashion.       Clinical Data: No additional findings.  Objective: Vital Signs: There were no vitals taken for this visit.  Physical Exam:  Constitutional: Patient appears well-developed HEENT:  Head: Normocephalic Eyes:EOM are normal Neck: Normal range of motion Cardiovascular: Normal rate Pulmonary/chest: Effort normal Neurologic: Patient is alert Skin: Skin is warm Psychiatric: Patient has normal mood and affect  Ortho Exam: Ortho exam demonstrates mild varus alignment bilateral knees.  No effusion in either knee.  Does have slight flexion contractures bilaterally but is able to bend past 90.  Mild groin pain on the left with internal/external rotation of the leg but no distinct restricted range of motion.  No nerve root tension signs on the left.  No definite trochanteric tenderness.  Specialty Comments:  No specialty comments available.  Imaging: No results found.   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   Atherosclerosis of aorta 10/13/2023   COVID-19 vaccination declined 06/14/2023   BMI 30.0-30.9,adult 01/20/2023   Obesity, Beginning BMI 35.02 01/20/2023   Essential hypertension 07/05/2022   Type 2 diabetes mellitus without complication, without long-term current use of insulin  (HCC) 07/05/2022   Mixed hyperlipidemia 03/04/2022   Insomnia 04/30/2019   Seasonal allergies 03/27/2019   Prediabetes 01/11/2019   Hypothyroidism 01/11/2019   Vitamin D  deficiency 01/11/2019   Upper airway cough syndrome 01/11/2019   Fatigue 01/11/2019   Menopause 06/08/2011   Past Medical History:  Diagnosis Date   Back pain    Death of child 86   Due to cord strangulation.   Diabetes mellitus without complication (HCC)    Hyperlipidemia    Hypertension    Hypothyroidism    Joint pain    Nonobstructive atherosclerosis of coronary artery    Osteoarthritis    Other fatigue    Shortness of breath on exertion    Thyroid  condition     Family History  Problem Relation Age of Onset   COPD Mother    Diabetes Mother    Hypertension Mother    Obesity Mother    Heart disease Father        died of sudden MI   Hypertension Father    Glaucoma Father     Past Surgical History:  Procedure Laterality Date   APPENDECTOMY  1974   CARDIAC CATHETERIZATION     two, dates not provided.   CHOLECYSTECTOMY     confirm date with patient, was it 48?   LAPAROSCOPIC TUBAL LIGATION  1987   THYROIDECTOMY     Social History   Occupational History   Occupation: retired    Associate Professor: [employer]  Tobacco Use   Smoking status: Never    Passive exposure: Past   Smokeless tobacco: Never  Vaping Use   Vaping status: Never Used  Substance and Sexual Activity   Alcohol use: Not Currently   Drug use: No   Sexual activity: Not Currently

## 2024-10-18 ENCOUNTER — Ambulatory Visit: Payer: Medicare Other

## 2024-10-20 ENCOUNTER — Other Ambulatory Visit (HOSPITAL_BASED_OUTPATIENT_CLINIC_OR_DEPARTMENT_OTHER): Payer: Self-pay | Admitting: Cardiovascular Disease

## 2024-10-20 DIAGNOSIS — I10 Essential (primary) hypertension: Secondary | ICD-10-CM

## 2024-10-20 LAB — LIPID PANEL
Chol/HDL Ratio: 2.3 ratio (ref 0.0–4.4)
Cholesterol, Total: 131 mg/dL (ref 100–199)
HDL: 56 mg/dL (ref >39)
LDL Chol Calc (NIH): 60 mg/dL (ref 0–99)
Triglycerides: 77 mg/dL (ref 0–149)
VLDL Cholesterol Cal: 15 mg/dL (ref 5–40)

## 2024-10-22 ENCOUNTER — Encounter: Payer: Self-pay | Admitting: Radiology

## 2024-10-24 ENCOUNTER — Ambulatory Visit: Payer: Self-pay | Admitting: Pharmacist Clinician (PhC)/ Clinical Pharmacy Specialist

## 2024-10-31 ENCOUNTER — Other Ambulatory Visit: Payer: Self-pay | Admitting: Nurse Practitioner

## 2024-10-31 ENCOUNTER — Encounter: Payer: Self-pay | Admitting: Nurse Practitioner

## 2024-10-31 ENCOUNTER — Ambulatory Visit (INDEPENDENT_AMBULATORY_CARE_PROVIDER_SITE_OTHER): Payer: Self-pay

## 2024-10-31 VITALS — BP 128/70 | HR 76 | Temp 98.7°F | Ht 65.0 in | Wt 184.8 lb

## 2024-10-31 DIAGNOSIS — Z0000 Encounter for general adult medical examination without abnormal findings: Secondary | ICD-10-CM

## 2024-10-31 DIAGNOSIS — Z1231 Encounter for screening mammogram for malignant neoplasm of breast: Secondary | ICD-10-CM

## 2024-10-31 DIAGNOSIS — R058 Other specified cough: Secondary | ICD-10-CM

## 2024-10-31 MED ORDER — HYDROCODONE BIT-HOMATROP MBR 5-1.5 MG/5ML PO SOLN: 5.0000 mL | Freq: Four times a day (QID) | ORAL | 0 refills | Status: DC | PRN

## 2024-10-31 NOTE — Progress Notes (Signed)
 Chief Complaint  Patient presents with   Medicare Wellness     Subjective:   Deborah [name] is a 73 y.o. female who presents for a Medicare Annual Wellness Visit.  Allergies (verified) Amlodipine, Aspartame and phenylalanine, Bidil [isosorb dinitrate-hydralazine ], Elavil  [amitriptyline ], Erythromycin, Fentanyl , Ibuprofen, Lisinopril, Metoprolol , Phenylalanine, Prilosec [omeprazole], Rosuvastatin , Tramadol , Valsartan, Verapamil, Zantac [ranitidine hcl], and Zetia  [ezetimibe ]   History: Past Medical History:  Diagnosis Date   Back pain    Death of child 63   Due to cord strangulation.   Diabetes mellitus without complication (HCC)    Hyperlipidemia    Hypertension    Hypothyroidism    Joint pain    Nonobstructive atherosclerosis of coronary artery    Osteoarthritis    Other fatigue    Shortness of breath on exertion    Thyroid  condition    Past Surgical History:  Procedure Laterality Date   APPENDECTOMY  1974   CARDIAC CATHETERIZATION     two, dates not provided.   CHOLECYSTECTOMY     confirm date with patient, was it 45?   LAPAROSCOPIC TUBAL LIGATION  1987   THYROIDECTOMY     Family History  Problem Relation Age of Onset   COPD Mother    Diabetes Mother    Hypertension Mother    Obesity Mother    Heart disease Father        died of sudden MI   Hypertension Father    Glaucoma Father    Social History   Occupational History   Occupation: retired    Associate Professor: [employer]  Tobacco Use   Smoking status: Never    Passive exposure: Past   Smokeless tobacco: Never  Vaping Use   Vaping status: Never Used  Substance and Sexual Activity   Alcohol use: Not Currently   Drug use: No   Sexual activity: Not Currently   Tobacco Counseling Counseling given: Not Answered  SDOH Screenings   Food Insecurity: No Food Insecurity (10/31/2024)  Housing: Unknown (10/31/2024)  Transportation Needs: No Transportation Needs (10/31/2024)  Utilities: Not At  Risk (10/31/2024)  Alcohol Screen: Low Risk  (10/31/2024)  Depression (PHQ2-9): Low Risk  (10/31/2024)  Financial Resource Strain: Low Risk  (10/31/2024)  Physical Activity: Inactive (10/31/2024)  Social Connections: Moderately Isolated (10/31/2024)  Stress: Stress Concern Present (10/31/2024)  Tobacco Use: Low Risk  (10/31/2024)  Health Literacy: Adequate Health Literacy (10/31/2024)   Depression Screen    10/31/2024    8:26 AM 07/17/2024    8:24 AM 11/15/2023    8:48 AM 10/13/2023    9:17 AM 06/14/2023    8:46 AM 11/02/2022    8:56 AM 09/29/2022    9:17 AM  PHQ 2/9 Scores  PHQ - 2 Score 1 0 0 0 1 0 0  PHQ- 9 Score 2 0  3  3  4         Data saved with a previous flowsheet row definition     Goals Addressed             This Visit's Progress    Patient Stated       10/31/2024, denies goals       Visit info / Clinical Intake: Medicare Wellness Visit Type:: Subsequent Annual Wellness Visit Persons participating in visit:: patient Medicare Wellness Visit Mode:: In-person (required for WTM) Information given by:: patient Interpreter Needed?: No Pre-visit prep was completed: yes AWV questionnaire completed by patient prior to visit?: no Living arrangements:: (!) lives alone Patient's Overall Health Status  Rating: good Typical amount of pain: some Does pain affect daily life?: no Are you currently prescribed opioids?: no  Dietary Habits and Nutritional Risks How many meals a day?: 3 Eats fruit and vegetables daily?: yes Most meals are obtained by: preparing own meals; eating out Diabetic:: (!) yes Any non-healing wounds?: no How often do you check your BS?: 1 Would you like to be referred to a Nutritionist or for Diabetic Management? : no  Functional Status Activities of Daily Living (to include ambulation/medication): Independent Ambulation: Independent Medication Administration: Independent Home Management: Independent Manage your own finances?: yes Primary  transportation is: driving Concerns about vision?: no *vision screening is required for WTM* Concerns about hearing?: no  Fall Screening Falls in the past year?: 1 (tripped) Number of falls in past year: 0 Was there an injury with Fall?: 0 Fall Risk Category Calculator: 1 Patient Fall Risk Level: Low Fall Risk  Fall Risk Patient at Risk for Falls Due to: No Fall Risks Fall risk Follow up: Falls prevention discussed; Falls evaluation completed  Home and Transportation Safety: All rugs have non-skid backing?: yes All stairs or steps have railings?: yes Grab bars in the bathtub or shower?: yes Have non-skid surface in bathtub or shower?: (!) no Good home lighting?: yes Regular seat belt use?: yes Hospital stays in the last year:: no  Cognitive Assessment Difficulty concentrating, remembering, or making decisions? : no Will 6CIT or Mini Cog be Completed: yes What year is it?: 0 points What month is it?: 0 points Give patient an address phrase to remember (5 components): [address] About what time is it?: 0 points Count backwards from 20 to 1: 2 points Say the months of the year in reverse: 0 points Repeat the address phrase from earlier: 0 points 6 CIT Score: 2 points  Advance Directives (For Healthcare) Does Patient Have a Medical Advance Directive?: No Would patient like information on creating a medical advance directive?: No - Patient declined  Reviewed/Updated  Reviewed/Updated: Reviewed All (Medical, Surgical, Family, Medications, Allergies, Care Teams, Patient Goals)        Objective:    Today's Vitals   10/31/24 0824  BP: 128/70  Pulse: 76  Temp: 98.7 F (37.1 C)  TempSrc: Oral  SpO2: 97%  Weight: 184 lb 12.8 oz (83.8 kg)  Height: 5' 5 (1.651 m)   Body mass index is 30.75 kg/m.  Current Medications (verified) Outpatient Encounter Medications as of 10/31/2024  Medication Sig   Accu-Chek FastClix Lancets MISC Use daily to check blood  sugars.   acetaminophen  (TYLENOL ) 500 MG tablet Take 1,000 mg by mouth at bedtime.   aspirin EC 81 MG tablet Take 81 mg by mouth daily. Swallow whole.   Blood Glucose Monitoring Suppl (ACCU-CHEK AVIVA PLUS) w/Device KIT Use daily to check blood sugars.   cholecalciferol (VITAMIN D ) 1000 units tablet Take 2,000 Units by mouth daily.   cloNIDine  (CATAPRES ) 0.1 MG tablet Please take a half tablet in the morning and a whole tablet in the evening ( 12 hours apart)   diclofenac  Sodium (VOLTAREN ) 1 % GEL Apply 4 g topically 4 (four) times daily.   docusate sodium (COLACE) 100 MG capsule Take 100 mg by mouth daily.   Evolocumab  (REPATHA  SURECLICK) 140 MG/ML SOAJ Inject 140 mg into the skin every 14 (fourteen) days.   glucose blood (ACCU-CHEK GUIDE TEST) test strip Use as instructed   hydrALAZINE  (APRESOLINE ) 100 MG tablet TAKE 1 AND 1/2 TABLETS BY MOUTH  TWICE  DAILY   hydrochlorothiazide  (HYDRODIURIL ) 12.5 MG tablet Take 1 tablet (12.5 mg total) by mouth daily.   HYDROcodone  bit-homatropine (HYCODAN) 5-1.5 MG/5ML syrup Take 5 mLs by mouth every 6 (six) hours as needed for cough.   ipratropium (ATROVENT ) 0.03 % nasal spray Place 1 spray into both nostrils at bedtime as needed for rhinitis.   levothyroxine  (SYNTHROID ) 88 MCG tablet TAKE 1 TABLET BY MOUTH DAILY BEFORE BREAKFAST.   MAGNESIUM  PO Take 375 mg by mouth daily.   Melatonin 10 MG CAPS Take 10 mg by mouth at bedtime.   metFORMIN  (GLUCOPHAGE -XR) 500 MG 24 hr tablet TAKE 1 TABLET BY MOUTH TWICE A DAY   naproxen  sodium (ALEVE ) 220 MG tablet Take 220 mg by mouth daily as needed.   spironolactone  (ALDACTONE ) 25 MG tablet Take 1 tablet (25 mg total) by mouth daily.   Turmeric 500 MG CAPS Take 1 tablet by mouth as needed (Takes occassionally). (Patient taking differently: Take 1 tablet by mouth daily.)   COLLAGEN PO Take 1 tablet by mouth daily. (Patient not taking: Reported on 10/31/2024)   cyclobenzaprine  (FLEXERIL ) 10 MG tablet Take 1 tablet (10 mg  total) by mouth 2 (two) times daily as needed for muscle spasms. (Patient not taking: Reported on 10/31/2024)   hydrochlorothiazide  (HYDRODIURIL ) 25 MG tablet TAKE 1 TABLET BY MOUTH DAILY   No facility-administered encounter medications on file as of 10/31/2024.   Hearing/Vision screen Hearing Screening - Comments:: Denies hearing issues Vision Screening - Comments:: Regular eye exam, [facility] Immunizations and Health Maintenance Health Maintenance  Topic Date Due   OPHTHALMOLOGY EXAM  02/09/2024   COVID-19 Vaccine (5 - 2025-26 season) 08/20/2024   HEMOGLOBIN A1C  01/17/2025   FOOT EXAM  06/14/2025   Diabetic kidney evaluation - eGFR measurement  06/27/2025   Diabetic kidney evaluation - Urine ACR  07/17/2025   Mammogram  10/25/2025   Medicare Annual Wellness (AWV)  10/31/2025   Colonoscopy  03/19/2026   DTaP/Tdap/Td (2 - Td or Tdap) 10/18/2029   Pneumococcal Vaccine: 50+ Years  Completed   Influenza Vaccine  Completed   DEXA SCAN  Completed   Hepatitis C Screening  Completed   Zoster Vaccines- Shingrix   Completed   Meningococcal B Vaccine  Aged Out        Assessment/Plan:  This is a routine wellness examination for Deborah [name].  Patient Care Team: Georgina Speaks, FNP as PCP - General (General Practice) [facility]  I have personally reviewed and noted the following in the patient's chart:   Medical and social history Use of alcohol, tobacco or illicit drugs  Current medications and supplements including opioid prescriptions. Functional ability and status Nutritional status Physical activity Advanced directives List of other physicians Hospitalizations, surgeries, and ER visits in previous 12 months Vitals Screenings to include cognitive, depression, and falls Referrals and appointments  No orders of the defined types were placed in this encounter.  In addition, I have reviewed and discussed with patient certain preventive protocols, quality  metrics, and best practice recommendations. A written personalized care plan for preventive services as well as general preventive health recommendations were provided to patient.   Deborah FORBES Dawn, LPN   88/[id number]/7974   Return in 1 year (on 10/31/2025).  After Visit Summary: (In Person-Printed) AVS printed and given to the patient  Nurse Notes: States has an eye appointment in December.

## 2024-10-31 NOTE — Patient Instructions (Addendum)
 Ms. Deborah [name],  Thank you for taking the time for your Medicare Wellness Visit. I appreciate your continued commitment to your health goals. Please review the care plan we discussed, and feel free to reach out if I can assist you further.  Please note that Annual Wellness Visits do not include a physical exam. Some assessments may be limited, especially if the visit was conducted virtually. If needed, we may recommend an in-person follow-up with your provider.  Ongoing Care Seeing your primary care provider every 3 to 6 months helps us  monitor your health and provide consistent, personalized care.   Referrals If a referral was made during today's visit and you haven't received any updates within two weeks, please contact the referred provider directly to check on the status.  Recommended Screenings:  Health Maintenance  Topic Date Due   Eye exam for diabetics  02/09/2024   COVID-19 Vaccine (5 - 2025-26 season) 08/20/2024   Hemoglobin A1C  01/17/2025   Complete foot exam   06/14/2025   Yearly kidney function blood test for diabetes  06/27/2025   Yearly kidney health urinalysis for diabetes  07/17/2025   Breast Cancer Screening  10/25/2025   Medicare Annual Wellness Visit  10/31/2025   Colon Cancer Screening  03/19/2026   DTaP/Tdap/Td vaccine (2 - Td or Tdap) 10/18/2029   Pneumococcal Vaccine for age over 51  Completed   Flu Shot  Completed   DEXA scan (bone density measurement)  Completed   Hepatitis C Screening  Completed   Zoster (Shingles) Vaccine  Completed   Meningitis B Vaccine  Aged Out       10/31/2024    8:31 AM  Advanced Directives  Does Patient Have a Medical Advance Directive? No  Would patient like information on creating a medical advance directive? No - Patient declined    Vision: Annual vision screenings are recommended for early detection of glaucoma, cataracts, and diabetic retinopathy. These exams can also reveal signs of chronic conditions such as diabetes  and high blood pressure.  Dental: Annual dental screenings help detect early signs of oral cancer, gum disease, and other conditions linked to overall health, including heart disease and diabetes.  Please see the attached documents for additional preventive care recommendations.

## 2024-11-07 ENCOUNTER — Ambulatory Visit: Admitting: Orthopedic Surgery

## 2024-11-07 ENCOUNTER — Other Ambulatory Visit: Payer: Self-pay

## 2024-11-07 ENCOUNTER — Encounter: Payer: Self-pay | Admitting: Orthopedic Surgery

## 2024-11-07 DIAGNOSIS — M1612 Unilateral primary osteoarthritis, left hip: Secondary | ICD-10-CM | POA: Diagnosis not present

## 2024-11-07 NOTE — Progress Notes (Signed)
 Office Visit Note   Patient: Deborah [name]           Date of Birth: 1951-10-08           MRN: [id number] Visit Date: 11/07/2024 Requested by: Georgina Speaks, FNP [address] [place],  [place] [address] PCP: Georgina Speaks, FNP  Subjective: Chief Complaint  Patient presents with   Left Hip - Pain    HPI: Makila Colombe is a 73 y.o. female who presents to the office reporting left hip pain.  Has known history of left hip arthritis.  Would like to have an injection performed into the hip joint.  They have helped her in the past.  Patient had bilateral knee injections in late October and they do help but not for too long..                ROS: All systems reviewed are negative as they relate to the chief complaint within the history of present illness.  Patient denies fevers or chills.  Assessment & Plan: Visit Diagnoses:  1. Arthritis of left hip     Plan: Impression is left hip pain.  Patient is not really quite there yet for hip replacement.  Injections have helped her in the past.  Will try 1 more hip injection under ultrasound guidance today.  Follow-up with us  as needed.  Follow-Up Instructions: No follow-ups on file.   Orders:  Orders Placed This Encounter  Procedures   US  Guided Needle Placement - No Linked Charges   No orders of the defined types were placed in this encounter.     Procedures: Large Joint Inj: L hip joint on 11/07/2024 10:34 PM Indications: pain and diagnostic evaluation Details: 18 G 3.5 in needle, ultrasound-guided anterior approach  Arthrogram: No  Medications: 5 mL lidocaine  1 %; 80 mg methylPREDNISolone  acetate 80 MG/ML; 4 mL bupivacaine  0.25 % Outcome: tolerated well, no immediate complications Procedure, treatment alternatives, risks and benefits explained, specific risks discussed. Consent was given by the patient. Immediately prior to procedure a time out was called to verify the correct patient, procedure,  equipment, support staff and site/side marked as required. Patient was prepped and draped in the usual sterile fashion.       Clinical Data: No additional findings.  Objective: Vital Signs: There were no vitals taken for this visit.  Physical Exam:  Constitutional: Patient appears well-developed HEENT:  Head: Normocephalic Eyes:EOM are normal Neck: Normal range of motion Cardiovascular: Normal rate Pulmonary/chest: Effort normal Neurologic: Patient is alert Skin: Skin is warm Psychiatric: Patient has normal mood and affect  Ortho Exam: Ortho exam demonstrates mild pain with left hip range of motion.  On ultrasound she does have trace effusion in the hip.  Some spurring is present.  Hip flexion abduction adduction strength is intact.  Both knees have minimal to no effusion.  No Trendelenburg gait today.  Specialty Comments:  No specialty comments available.  Imaging: No results found.   PMFS History: Patient Active Problem List   Diagnosis Date Noted   Nonallergic rhinitis 09/10/2024   Class 1 obesity due to excess calories without serious comorbidity with body mass index (BMI) of 32.0 to 32.9 in adult 07/17/2024   Type 2 diabetes mellitus with hyperlipidemia (HCC) 07/17/2024   Statin intolerance 07/17/2024   Medication intolerance 06/04/2024   Encounter for annual health examination 11/15/2023   Hypertensive heart disease without heart failure 11/15/2023   Functional urinary incontinence 11/15/2023   Persistent cough 11/15/2023  Statin myopathy 11/15/2023   Atherosclerosis of aorta 10/13/2023   COVID-19 vaccination declined 06/14/2023   BMI 30.0-30.9,adult 01/20/2023   Obesity, Beginning BMI 35.02 01/20/2023   Essential hypertension 07/05/2022   Type 2 diabetes mellitus without complication, without long-term current use of insulin  (HCC) 07/05/2022   Mixed hyperlipidemia 03/04/2022   Insomnia 04/30/2019   Seasonal allergies 03/27/2019   Prediabetes 01/11/2019    Hypothyroidism 01/11/2019   Vitamin D  deficiency 01/11/2019   Upper airway cough syndrome 01/11/2019   Fatigue 01/11/2019   Menopause 06/08/2011   Past Medical History:  Diagnosis Date   Back pain    Death of child 50   Due to cord strangulation.   Diabetes mellitus without complication (HCC)    Hyperlipidemia    Hypertension    Hypothyroidism    Joint pain    Nonobstructive atherosclerosis of coronary artery    Osteoarthritis    Other fatigue    Shortness of breath on exertion    Thyroid  condition     Family History  Problem Relation Age of Onset   COPD Mother    Diabetes Mother    Hypertension Mother    Obesity Mother    Heart disease Father        died of sudden MI   Hypertension Father    Glaucoma Father     Past Surgical History:  Procedure Laterality Date   APPENDECTOMY  1974   CARDIAC CATHETERIZATION     two, dates not provided.   CHOLECYSTECTOMY     confirm date with patient, was it 82?   LAPAROSCOPIC TUBAL LIGATION  1987   THYROIDECTOMY     Social History   Occupational History   Occupation: retired    Associate Professor: [employer]  Tobacco Use   Smoking status: Never    Passive exposure: Past   Smokeless tobacco: Never  Vaping Use   Vaping status: Never Used  Substance and Sexual Activity   Alcohol use: Not Currently   Drug use: No   Sexual activity: Not Currently

## 2024-11-10 ENCOUNTER — Encounter: Payer: Self-pay | Admitting: Orthopedic Surgery

## 2024-11-10 MED ORDER — METHYLPREDNISOLONE ACETATE 80 MG/ML IJ SUSP
80.0000 mg | INTRAMUSCULAR | Status: AC | PRN
  Administered 2024-11-07: 80 mg via INTRA_ARTICULAR

## 2024-11-10 MED ORDER — BUPIVACAINE HCL 0.25 % IJ SOLN
4.0000 mL | INTRAMUSCULAR | Status: AC | PRN
  Administered 2024-11-07: 4 mL via INTRA_ARTICULAR

## 2024-11-10 MED ORDER — LIDOCAINE HCL 1 % IJ SOLN
5.0000 mL | INTRAMUSCULAR | Status: AC | PRN
  Administered 2024-11-07: 5 mL

## 2024-11-19 ENCOUNTER — Encounter: Payer: Self-pay | Admitting: Orthopedic Surgery

## 2024-11-19 ENCOUNTER — Ambulatory Visit: Payer: Self-pay | Admitting: Nurse Practitioner

## 2024-11-19 ENCOUNTER — Other Ambulatory Visit: Payer: Self-pay | Admitting: Nurse Practitioner

## 2024-11-19 ENCOUNTER — Encounter: Payer: Self-pay | Admitting: Nurse Practitioner

## 2024-11-19 VITALS — BP 110/70 | HR 78 | Temp 97.7°F | Ht 65.0 in | Wt 185.8 lb

## 2024-11-19 DIAGNOSIS — E782 Mixed hyperlipidemia: Secondary | ICD-10-CM

## 2024-11-19 DIAGNOSIS — E039 Hypothyroidism, unspecified: Secondary | ICD-10-CM | POA: Diagnosis not present

## 2024-11-19 DIAGNOSIS — E785 Hyperlipidemia, unspecified: Secondary | ICD-10-CM | POA: Diagnosis not present

## 2024-11-19 DIAGNOSIS — Z0000 Encounter for general adult medical examination without abnormal findings: Secondary | ICD-10-CM

## 2024-11-19 DIAGNOSIS — E1169 Type 2 diabetes mellitus with other specified complication: Secondary | ICD-10-CM | POA: Diagnosis not present

## 2024-11-19 DIAGNOSIS — I119 Hypertensive heart disease without heart failure: Secondary | ICD-10-CM

## 2024-11-19 DIAGNOSIS — Z6830 Body mass index (BMI) 30.0-30.9, adult: Secondary | ICD-10-CM

## 2024-11-19 DIAGNOSIS — E559 Vitamin D deficiency, unspecified: Secondary | ICD-10-CM

## 2024-11-19 DIAGNOSIS — Z79899 Other long term (current) drug therapy: Secondary | ICD-10-CM

## 2024-11-19 DIAGNOSIS — R053 Chronic cough: Secondary | ICD-10-CM | POA: Insufficient documentation

## 2024-11-19 LAB — POCT URINALYSIS DIP (CLINITEK)
Bilirubin, UA: NEGATIVE
Blood, UA: NEGATIVE
Glucose, UA: NEGATIVE mg/dL
Ketones, POC UA: NEGATIVE mg/dL
Leukocytes, UA: NEGATIVE
Nitrite, UA: NEGATIVE
POC PROTEIN,UA: NEGATIVE
Spec Grav, UA: 1.015 (ref 1.010–1.025)
Urobilinogen, UA: 0.2 U/dL
pH, UA: 6 (ref 5.0–8.0)

## 2024-11-19 MED ORDER — BENZONATATE 100 MG PO CAPS: 100.0000 mg | ORAL_CAPSULE | Freq: Two times a day (BID) | ORAL | 5 refills | Status: DC | PRN

## 2024-11-19 NOTE — Progress Notes (Signed)
 LILLETTE Kristeen JINNY Gladis, CMA,acting as a neurosurgeon for Gaines Ada, FNP.,have documented all relevant documentation on the behalf of Gaines Ada, FNP,as directed by  Gaines Ada, FNP while in the presence of Gaines Ada, FNP.  Subjective:    Patient ID: Deborah [name] , female    DOB: [birth year] , 73 y.o.   MRN: [id number]  Chief Complaint  Patient presents with   Annual Exam    Patient presents today for HM, Patient reports compliance with medication. Patient denies any chest pain, SOB, or headaches. Patient has no concerns today.     She is due to have her diabetic eye exam this month  Diabetes She presents for her initial diabetic visit. She has type 2 diabetes mellitus. Her disease course has been stable. There are no hypoglycemic associated symptoms. Pertinent negatives for hypoglycemia include no confusion, dizziness or headaches. There are no diabetic associated symptoms. Pertinent negatives for diabetes include no blurred vision, no chest pain, no fatigue, no polydipsia, no polyphagia and no polyuria. There are no hypoglycemic complications. Symptoms are stable. Diabetic complications include heart disease. Risk factors for coronary artery disease include obesity, sedentary lifestyle, hypertension, post-menopausal, dyslipidemia and diabetes mellitus (prediabetes). Current diabetic treatment includes oral agent (monotherapy). She is compliant with treatment most of the time. Her weight is decreasing steadily. She is following a generally healthy diet. Diabetic meal planning: continues to eat a healthy diet she is not snacking a lot - eats 1-2 meals a day. She has had a previous visit with a dietitian. She participates in exercise intermittently (not currently after the recent loss of her husband). There is no change in her home blood glucose trend. (Blood sugar ranges 100-115) An ACE inhibitor/angiotensin II receptor blocker is being taken. She does not see a podiatrist.Eye exam is not current  (she has an appt in December 2025).    Discussed the use of AI scribe software for clinical note transcription with the patient, who gave verbal consent to proceed.  History of Present Illness Deborah [name] is a 73 year old female who presents for an annual physical exam.  She has a history of diabetes and hyperlipidemia. Her blood sugar levels range from 100 to 115 mg/dL. She is concerned that Repatha  may be affecting her blood sugar levels, although it has been effective for her cholesterol management. She is due for a diabetic eye exam this month.  She has been experiencing a cough and has previously tried low dose gabapentin  and amitriptyline , both of which caused adverse effects. She currently uses Tessalon  Perles as needed, primarily at bedtime, and wishes to continue this medication.  Her sleep has not been good especially since the loss of her husband, and she uses melatonin occasionally, which she finds somewhat effective. She has not sought grief counseling following the loss of her husband and is not interested in pursuing it at this time.  She has seen an ENT doctor since her last visit, who is associated with [facility], and has not seen any other doctors. She has a dentist appointment every six months for cleaning.  She works part-time occasionally and does not follow a specific diet or exercise regimen. No abnormal vaginal bleeding, trouble swallowing, constipation, or diarrhea.  Past Medical History:  Diagnosis Date   Back pain    Death of child 60   Due to cord strangulation.   Diabetes mellitus without complication (HCC)    Hyperlipidemia    Hypertension    Hypothyroidism  Joint pain    Nonobstructive atherosclerosis of coronary artery    Osteoarthritis    Other fatigue    Shortness of breath on exertion    Thyroid  condition      Family History  Problem Relation Age of Onset   COPD Mother    Diabetes Mother    Hypertension Mother    Obesity  Mother    Heart disease Father        died of sudden MI   Hypertension Father    Glaucoma Father      Current Outpatient Medications:    Accu-Chek FastClix Lancets MISC, Use daily to check blood sugars., Disp: 100 each, Rfl: 2   acetaminophen  (TYLENOL ) 500 MG tablet, Take 1,000 mg by mouth at bedtime., Disp: , Rfl:    aspirin EC 81 MG tablet, Take 81 mg by mouth daily. Swallow whole., Disp: , Rfl:    benzonatate  (TESSALON ) 100 MG capsule, Take 1 capsule (100 mg total) by mouth 2 (two) times daily as needed for cough., Disp: 60 capsule, Rfl: 5   Blood Glucose Monitoring Suppl (ACCU-CHEK AVIVA PLUS) w/Device KIT, Use daily to check blood sugars., Disp: 1 kit, Rfl: 0   cholecalciferol (VITAMIN D ) 1000 units tablet, Take 2,000 Units by mouth daily., Disp: , Rfl:    cloNIDine  (CATAPRES ) 0.1 MG tablet, Please take a half tablet in the morning and a whole tablet in the evening ( 12 hours apart), Disp: 180 tablet, Rfl: 3   diclofenac  Sodium (VOLTAREN ) 1 % GEL, Apply 4 g topically 4 (four) times daily., Disp: 100 g, Rfl: 0   docusate sodium (COLACE) 100 MG capsule, Take 100 mg by mouth daily., Disp: , Rfl:    Evolocumab  (REPATHA  SURECLICK) 140 MG/ML SOAJ, Inject 140 mg into the skin every 14 (fourteen) days., Disp: 6 mL, Rfl: 3   glucose blood (ACCU-CHEK GUIDE TEST) test strip, Use as instructed, Disp: 100 each, Rfl: 12   hydrALAZINE  (APRESOLINE ) 100 MG tablet, TAKE 1 AND 1/2 TABLETS BY MOUTH  TWICE DAILY, Disp: 300 tablet, Rfl: 2   hydrochlorothiazide  (HYDRODIURIL ) 12.5 MG tablet, Take 1 tablet (12.5 mg total) by mouth daily., Disp: 90 tablet, Rfl: 3   hydrochlorothiazide  (HYDRODIURIL ) 25 MG tablet, TAKE 1 TABLET BY MOUTH DAILY, Disp: 90 tablet, Rfl: 2   HYDROcodone  bit-homatropine (HYCODAN) 5-1.5 MG/5ML syrup, Take 5 mLs by mouth every 6 (six) hours as needed for cough., Disp: 120 mL, Rfl: 0   ipratropium (ATROVENT ) 0.03 % nasal spray, Place 1 spray into both nostrils at bedtime as needed for  rhinitis., Disp: 30 mL, Rfl: 12   levothyroxine  (SYNTHROID ) 88 MCG tablet, TAKE 1 TABLET BY MOUTH DAILY BEFORE BREAKFAST., Disp: 100 tablet, Rfl: 1   MAGNESIUM  PO, Take 375 mg by mouth daily., Disp: , Rfl:    Melatonin 10 MG CAPS, Take 10 mg by mouth at bedtime., Disp: , Rfl:    metFORMIN  (GLUCOPHAGE -XR) 500 MG 24 hr tablet, TAKE 1 TABLET BY MOUTH TWICE A DAY, Disp: 180 tablet, Rfl: 1   naproxen  sodium (ALEVE ) 220 MG tablet, Take 220 mg by mouth daily as needed., Disp: , Rfl:    spironolactone  (ALDACTONE ) 25 MG tablet, Take 1 tablet (25 mg total) by mouth daily., Disp: 90 tablet, Rfl: 3   Turmeric 500 MG CAPS, Take 1 tablet by mouth as needed (Takes occassionally). (Patient taking differently: Take 1 tablet by mouth daily.), Disp: , Rfl:    COLLAGEN PO, Take 1 tablet by mouth daily. (Patient not taking:  Reported on 11/19/2024), Disp: , Rfl:    cyclobenzaprine  (FLEXERIL ) 10 MG tablet, Take 1 tablet (10 mg total) by mouth 2 (two) times daily as needed for muscle spasms. (Patient not taking: Reported on 11/19/2024), Disp: 20 tablet, Rfl: 0   Allergies  Allergen Reactions   Amlodipine     headache   Aspartame And Phenylalanine Other (See Comments)    Causes migraines   Bidil [Isosorb Dinitrate-Hydralazine ]     headache   Elavil  [Amitriptyline ] Other (See Comments)    Loss of balance, hallucinations   Erythromycin     Ask patient to specify type/severity/reaction. Not indicated on medical history form dated 11/04/10.   Fentanyl      Causes panic attacks   Ibuprofen     Ask patient to specify type/severity/reaction. Not noted on medical history form dated 11/04/10.   Lisinopril Cough   Metoprolol  Other (See Comments)   Phenylalanine Other (See Comments)    Causes migraine   Prilosec [Omeprazole] Nausea And Vomiting   Rosuvastatin  Other (See Comments)   Tramadol  Nausea And Vomiting   Valsartan Cough   Verapamil Nausea And Vomiting   Zantac [Ranitidine Hcl]    Zetia  [Ezetimibe ]      myalgias     The patient states she uses post menopausal status for birth control. No LMP recorded. Patient is postmenopausal.. Negative for Dysmenorrhea and Negative for Menorrhagia. Negative for: breast discharge, breast lump(s), breast pain and breast self exam. Associated symptoms include abnormal vaginal bleeding. Pertinent negatives include abnormal bleeding (hematology), anxiety, decreased libido, depression, difficulty falling sleep, dyspareunia, history of infertility, nocturia, sexual dysfunction, sleep disturbances, urinary incontinence, urinary urgency, vaginal discharge and vaginal itching. Diet regular. The patient states her exercise level is none.   The patient's tobacco use is:  Social History   Tobacco Use  Smoking Status Never   Passive exposure: Past  Smokeless Tobacco Never   She has been exposed to passive smoke. The patient's alcohol use is:  Social History   Substance and Sexual Activity  Alcohol Use Not Currently   Review of Systems  Constitutional: Negative.  Negative for fatigue.  HENT: Negative.    Eyes: Negative.  Negative for blurred vision.  Respiratory: Negative.    Cardiovascular: Negative.  Negative for chest pain.  Gastrointestinal: Negative.   Endocrine: Negative.  Negative for polydipsia, polyphagia and polyuria.  Genitourinary: Negative.   Musculoskeletal: Negative.   Skin: Negative.   Allergic/Immunologic: Negative.   Neurological: Negative.  Negative for dizziness and headaches.  Hematological: Negative.   Psychiatric/Behavioral:  Positive for sleep disturbance (also has recent loss of her husband). Negative for confusion.      Today's Vitals   11/19/24 0902  BP: 110/70  Pulse: 78  Temp: 97.7 F (36.5 C)  TempSrc: Oral  Weight: 185 lb 12.8 oz (84.3 kg)  Height: 5' 5 (1.651 m)  PainSc: 0-No pain   Body mass index is 30.92 kg/m.  Wt Readings from Last 3 Encounters:  11/19/24 185 lb 12.8 oz (84.3 kg)  10/31/24 184 lb 12.8 oz  (83.8 kg)  07/17/24 183 lb (83 kg)     Objective:  Physical Exam Vitals and nursing note reviewed.  Constitutional:      General: She is not in acute distress.    Appearance: Normal appearance. She is well-developed. She is obese.  HENT:     Head: Normocephalic and atraumatic.     Right Ear: Hearing, tympanic membrane, ear canal and external ear normal. There is no impacted cerumen.  Left Ear: Hearing, tympanic membrane, ear canal and external ear normal. There is no impacted cerumen.     Nose: Nose normal.     Mouth/Throat:     Mouth: Mucous membranes are moist.  Eyes:     General: Lids are normal.     Extraocular Movements: Extraocular movements intact.     Conjunctiva/sclera: Conjunctivae normal.     Pupils: Pupils are equal, round, and reactive to light.     Funduscopic exam:    Right eye: No papilledema.        Left eye: No papilledema.  Neck:     Thyroid : No thyroid  mass.     Vascular: No carotid bruit.  Cardiovascular:     Rate and Rhythm: Normal rate and regular rhythm.     Pulses: Normal pulses.     Heart sounds: Normal heart sounds. No murmur heard. Pulmonary:     Effort: Pulmonary effort is normal. No respiratory distress.     Breath sounds: Normal breath sounds. No wheezing.  Chest:     Chest wall: No mass.  Breasts:    Tanner Score is 5.     Right: Normal. No mass or tenderness.     Left: Normal. No mass or tenderness.  Abdominal:     General: Abdomen is flat. Bowel sounds are normal. There is no distension.     Palpations: Abdomen is soft.     Tenderness: There is no abdominal tenderness.  Musculoskeletal:        General: No swelling or tenderness. Normal range of motion.     Cervical back: Full passive range of motion without pain, normal range of motion and neck supple.     Right lower leg: No edema.     Left lower leg: No edema.  Lymphadenopathy:     Upper Body:     Right upper body: No supraclavicular, axillary or pectoral adenopathy.     Left  upper body: No supraclavicular, axillary or pectoral adenopathy.  Skin:    General: Skin is warm and dry.     Capillary Refill: Capillary refill takes less than 2 seconds.     Coloration: Skin is not jaundiced.     Findings: No bruising.  Neurological:     General: No focal deficit present.     Mental Status: She is alert and oriented to person, place, and time.     Cranial Nerves: No cranial nerve deficit.     Sensory: No sensory deficit.     Motor: No weakness.  Psychiatric:        Mood and Affect: Mood normal.        Behavior: Behavior normal.        Thought Content: Thought content normal.        Judgment: Judgment normal.     Assessment And Plan:     Encounter for annual health examination Assessment & Plan: Routine wellness visit with no significant issues. Discussed sleep and grief counseling options. - Ordered blood work including A1c and thyroid  levels. - Encouraged monthly self-breast exams. - Continue melatonin for sleep. - Discussed grief counseling if interested in the future.   Type 2 diabetes mellitus with hyperlipidemia (HCC) Assessment & Plan: Monitoring A1c for glycemic control. Repatha 's impact on blood sugar unproven. - Ordered A1c test.  Repatha  effective for cholesterol management. No confirmed impact on blood sugar. - Continue Repatha  for cholesterol management.  Orders: -     EKG 12-Lead -     POCT URINALYSIS  DIP (CLINITEK) -     Microalbumin / creatinine urine ratio -     CMP14+EGFR -     Hemoglobin A1c -     Lipid panel  Hypertensive heart disease without heart failure Assessment & Plan: Blood pressure is well controlled.  Encouraged to focus on lifestyle modifications. EKG done no changes from previous.   Orders: -     EKG 12-Lead -     POCT URINALYSIS DIP (CLINITEK) -     Microalbumin / creatinine urine ratio -     CMP14+EGFR  Acquired hypothyroidism Assessment & Plan: Thyroid  levels are normal.  Continue current medications.  Will  check thyroid  levels today.  Orders: -     TSH + free T4  Vitamin D  deficiency Assessment & Plan: Will check vitamin D  level and supplement as needed.    Also encouraged to spend 15 minutes in the sun daily.    Orders: -     VITAMIN D  25 Hydroxy (Vit-D Deficiency, Fractures)  Mixed hyperlipidemia  BMI 30.0-30.9,adult  Chronic cough Assessment & Plan: Tessalon  Perles used occasionally with ENT and allergy approval. Previous medications not tolerated. - Sent prescription for Tessalon  Perles. - Continue Tessalon  Perles as needed.  Orders: -     Benzonatate ; Take 1 capsule (100 mg total) by mouth 2 (two) times daily as needed for cough.  Dispense: 60 capsule; Refill: 5  Other long term (current) drug therapy -     CBC with Differential/Platelet    Return for 1 year physical, controlled DM check 4 months. Patient was given opportunity to ask questions. Patient verbalized understanding of the plan and was able to repeat key elements of the plan. All questions were answered to their satisfaction.   Gaines Ada, FNP  I, Gaines Ada, FNP, have reviewed all documentation for this visit. The documentation on 11/19/24 for the exam, diagnosis, procedures, and orders are all accurate and complete.

## 2024-11-19 NOTE — Assessment & Plan Note (Signed)
 Monitoring A1c for glycemic control. Repatha 's impact on blood sugar unproven. - Ordered A1c test.  Repatha  effective for cholesterol management. No confirmed impact on blood sugar. - Continue Repatha  for cholesterol management.

## 2024-11-19 NOTE — Assessment & Plan Note (Signed)
 Routine wellness visit with no significant issues. Discussed sleep and grief counseling options. - Ordered blood work including A1c and thyroid  levels. - Encouraged monthly self-breast exams. - Continue melatonin for sleep. - Discussed grief counseling if interested in the future.

## 2024-11-19 NOTE — Assessment & Plan Note (Signed)
 Thyroid levels are normal.  Continue current medications.  Will check thyroid levels today.

## 2024-11-19 NOTE — Assessment & Plan Note (Addendum)
 Blood pressure is well controlled.  Encouraged to focus on lifestyle modifications. EKG done no changes from previous.

## 2024-11-19 NOTE — Assessment & Plan Note (Signed)
 Will check vitamin D  level and supplement as needed.    Also encouraged to spend 15 minutes in the sun daily.

## 2024-11-19 NOTE — Assessment & Plan Note (Signed)
 Tessalon  Perles used occasionally with ENT and allergy approval. Previous medications not tolerated. - Sent prescription for Tessalon  Perles. - Continue Tessalon  Perles as needed.

## 2024-11-20 NOTE — Telephone Encounter (Signed)
 If she is having back pain you are correct she has not had any imaging studies outside of a CT abdomen pelvis done in 2020 which did show some of her back.  If her back hurts all the time she should come in to see either Deborah [name] or Deborah [name] so they can get x-rays and then set up an MRI scan.  Thanks

## 2024-11-26 ENCOUNTER — Encounter: Payer: Self-pay | Admitting: Nurse Practitioner

## 2024-11-27 ENCOUNTER — Inpatient Hospital Stay
Admission: RE | Admit: 2024-11-27 | Discharge: 2024-11-27 | Attending: Nurse Practitioner | Admitting: Nurse Practitioner

## 2024-11-27 DIAGNOSIS — Z1231 Encounter for screening mammogram for malignant neoplasm of breast: Secondary | ICD-10-CM

## 2024-11-28 LAB — LIPID PANEL
Chol/HDL Ratio: 2.5 ratio (ref 0.0–4.4)
Cholesterol, Total: 147 mg/dL (ref 100–199)
HDL: 59 mg/dL (ref >39)
LDL Chol Calc (NIH): 71 mg/dL (ref 0–99)
Triglycerides: 89 mg/dL (ref 0–149)
VLDL Cholesterol Cal: 17 mg/dL (ref 5–40)

## 2024-11-28 LAB — CMP14+EGFR
ALT: 12 IU/L (ref 0–32)
AST: 12 IU/L (ref 0–40)
Albumin: 4.5 g/dL (ref 3.8–4.8)
Alkaline Phosphatase: 80 IU/L (ref 49–135)
BUN/Creatinine Ratio: 26 (ref 12–28)
BUN: 17 mg/dL (ref 8–27)
Bilirubin Total: 0.3 mg/dL (ref 0.0–1.2)
CO2: 23 mmol/L (ref 20–29)
Calcium: 9.6 mg/dL (ref 8.7–10.3)
Chloride: 96 mmol/L (ref 96–106)
Creatinine, Ser: 0.66 mg/dL (ref 0.57–1.00)
Globulin, Total: 2.2 g/dL (ref 1.5–4.5)
Glucose: 87 mg/dL (ref 70–99)
Potassium: 4.5 mmol/L (ref 3.5–5.2)
Sodium: 134 mmol/L (ref 134–144)
Total Protein: 6.7 g/dL (ref 6.0–8.5)
eGFR: 93 mL/min/1.73 (ref >59)

## 2024-11-28 LAB — CBC WITH DIFFERENTIAL/PLATELET
Basophils Absolute: 0 x10E3/uL (ref 0.0–0.2)
Basos: 1 %
EOS (ABSOLUTE): 0.1 x10E3/uL (ref 0.0–0.4)
Eos: 1 %
Hematocrit: 34.2 % (ref 34.0–46.6)
Hemoglobin: 11.1 g/dL (ref 11.1–15.9)
Immature Grans (Abs): 0 x10E3/uL (ref 0.0–0.1)
Immature Granulocytes: 0 %
Lymphocytes Absolute: 1.6 x10E3/uL (ref 0.7–3.1)
Lymphs: 32 %
MCH: 28.9 pg (ref 26.6–33.0)
MCHC: 32.5 g/dL (ref 31.5–35.7)
MCV: 89 fL (ref 79–97)
Monocytes Absolute: 0.6 x10E3/uL (ref 0.1–0.9)
Monocytes: 12 %
Neutrophils Absolute: 2.7 x10E3/uL (ref 1.4–7.0)
Neutrophils: 54 %
Platelets: 536 x10E3/uL — ABNORMAL HIGH (ref 150–450)
RBC: 3.84 x10E6/uL (ref 3.77–5.28)
RDW: 13.1 % (ref 11.7–15.4)
WBC: 5.1 x10E3/uL (ref 3.4–10.8)

## 2024-11-28 LAB — HEMOGLOBIN A1C
Est. average glucose Bld gHb Est-mCnc: 120 mg/dL
Hgb A1c MFr Bld: 5.8 % — ABNORMAL HIGH (ref 4.8–5.6)

## 2024-11-28 LAB — TSH+FREE T4
Free T4: 1.64 ng/dL (ref 0.82–1.77)
TSH: 2.63 u[IU]/mL (ref 0.450–4.500)

## 2024-11-28 LAB — VITAMIN D 25 HYDROXY (VIT D DEFICIENCY, FRACTURES): Vit D, 25-Hydroxy: 55.3 ng/mL (ref 30.0–100.0)

## 2024-11-28 LAB — MICROALBUMIN / CREATININE URINE RATIO
Creatinine, Urine: 38.6 mg/dL
Microalb/Creat Ratio: <8 mg/g{creat} (ref 0–29)
Microalbumin, Urine: <3 ug/mL

## 2024-11-28 LAB — SPECIMEN STATUS REPORT

## 2024-11-30 ENCOUNTER — Other Ambulatory Visit

## 2024-11-30 ENCOUNTER — Ambulatory Visit: Admitting: Physician Assistant

## 2024-11-30 ENCOUNTER — Encounter: Payer: Self-pay | Admitting: Physician Assistant

## 2024-11-30 DIAGNOSIS — G8929 Other chronic pain: Secondary | ICD-10-CM

## 2024-11-30 DIAGNOSIS — M545 Low back pain, unspecified: Secondary | ICD-10-CM

## 2024-11-30 NOTE — Progress Notes (Signed)
 Office Visit Note   Patient: Deborah [name]           Date of Birth: [birth year]           MRN: [id number] Visit Date: 11/30/2024              Requested by: Georgina Speaks, FNP [address] [place],  [place] [id number] PCP: Georgina Speaks, FNP   Assessment & Plan: Visit Diagnoses:  1. Chronic bilateral low back pain without sciatica     Plan: Patient is a pleasant 73 year old woman who is a patient of Dr. Addie comes in today with a 1+ year of low back pain.  She denies any injuries.  She does not have any radicular findings.  Pain is focused in the lower back.  Her strength is good no loss of bowel or bladder control or other red flags.  X-rays and exam consistent with arthritis most noted at the L4 OR 5 L5-S1.  Would like for her to try physical therapy.  If this does not work after a month she can contact me via MyChart I will order an MRI for consideration of epidural steroid injections  Follow-Up Instructions: No follow-ups on file.   Orders:  Orders Placed This Encounter  Procedures   XR Lumbar Spine 2-3 Views   Ambulatory referral to Physical Therapy   No orders of the defined types were placed in this encounter.     Procedures: No procedures performed   Clinical Data: No additional findings.   Subjective: No chief complaint on file.   HPI pleasant 73 year old woman referred to see me for evaluation of low back pain going on for about a year.  She takes an Aleve  every day.  No loss of bowel or bladder control.  No history of injury.  No history of weakness  Review of Systems  All other systems reviewed and are negative.    Objective: Vital Signs: There were no vitals taken for this visit.  Physical Exam Musculoskeletal:     Cervical back: Normal range of motion.  Skin:    General: Skin is warm and dry.  Neurological:     General: No focal deficit present.     Mental Status: She is alert and oriented to person, place, and time.   Psychiatric:        Mood and Affect: Mood normal.        Behavior: Behavior normal.     Ortho Exam Examination of her low back she has tenderness to palpation over the lower back no step-offs no crepitus.  She has pain with extension of her back not as much with forward flexion.  Strength is 5 out of 5 with dorsiflexion plantarflexion extension and flexion of her knees.  Sensation is intact. Specialty Comments:  No specialty comments available.  Imaging: XR Lumbar Spine 2-3 Views Result Date: 11/30/2024 Radiographs demonstrate multiple degenerative changes with loss of joint space osteophytes and sclerotic changes L5-S1 and L4-5 L3-4    PMFS History: Patient Active Problem List   Diagnosis Date Noted   Chronic cough 11/19/2024   Nonallergic rhinitis 09/10/2024   Class 1 obesity due to excess calories without serious comorbidity with body mass index (BMI) of 32.0 to 32.9 in adult 07/17/2024   Type 2 diabetes mellitus with hyperlipidemia (HCC) 07/17/2024   Statin intolerance 07/17/2024   Medication intolerance 06/04/2024   Encounter for annual health examination 11/15/2023   Hypertensive heart disease without heart failure 11/15/2023  Functional urinary incontinence 11/15/2023   Persistent cough 11/15/2023   Statin myopathy 11/15/2023   Atherosclerosis of aorta 10/13/2023   COVID-19 vaccination declined 06/14/2023   BMI 30.0-30.9,adult 01/20/2023   Obesity, Beginning BMI 35.02 01/20/2023   Essential hypertension 07/05/2022   Type 2 diabetes mellitus without complication, without long-term current use of insulin  (HCC) 07/05/2022   Insomnia 04/30/2019   Seasonal allergies 03/27/2019   Prediabetes 01/11/2019   Hypothyroidism 01/11/2019   Vitamin D  deficiency 01/11/2019   Upper airway cough syndrome 01/11/2019   Fatigue 01/11/2019   Menopause 06/08/2011   Past Medical History:  Diagnosis Date   Back pain    Death of child 16   Due to cord strangulation.   Diabetes  mellitus without complication (HCC)    Hyperlipidemia    Hypertension    Hypothyroidism    Joint pain    Nonobstructive atherosclerosis of coronary artery    Osteoarthritis    Other fatigue    Shortness of breath on exertion    Thyroid  condition     Family History  Problem Relation Age of Onset   COPD Mother    Diabetes Mother    Hypertension Mother    Obesity Mother    Heart disease Father        died of sudden MI   Hypertension Father    Glaucoma Father     Past Surgical History:  Procedure Laterality Date   APPENDECTOMY  1974   CARDIAC CATHETERIZATION     two, dates not provided.   CHOLECYSTECTOMY     confirm date with patient, was it 62?   LAPAROSCOPIC TUBAL LIGATION  1987   THYROIDECTOMY     Social History   Occupational History   Occupation: retired    Associate Professor: [employer]  Tobacco Use   Smoking status: Never    Passive exposure: Past   Smokeless tobacco: Never  Vaping Use   Vaping status: Never Used  Substance and Sexual Activity   Alcohol use: Not Currently   Drug use: No   Sexual activity: Not Currently

## 2024-12-03 ENCOUNTER — Encounter: Payer: Self-pay | Admitting: Nurse Practitioner

## 2024-12-05 ENCOUNTER — Encounter: Payer: Self-pay | Admitting: Nurse Practitioner

## 2024-12-06 ENCOUNTER — Encounter: Payer: Self-pay | Admitting: Nurse Practitioner

## 2024-12-06 ENCOUNTER — Other Ambulatory Visit: Payer: Self-pay | Admitting: Nurse Practitioner

## 2024-12-06 ENCOUNTER — Ambulatory Visit: Payer: Self-pay | Admitting: Nurse Practitioner

## 2024-12-06 DIAGNOSIS — R053 Chronic cough: Secondary | ICD-10-CM

## 2024-12-06 DIAGNOSIS — R058 Other specified cough: Secondary | ICD-10-CM

## 2024-12-06 MED ORDER — BENZONATATE 200 MG PO CAPS: 200.0000 mg | ORAL_CAPSULE | Freq: Two times a day (BID) | ORAL | 3 refills | Status: AC | PRN

## 2024-12-06 MED ORDER — HYDROCODONE BIT-HOMATROP MBR 5-1.5 MG/5ML PO SOLN: 5.0000 mL | Freq: Four times a day (QID) | ORAL | 0 refills | Status: DC | PRN

## 2024-12-11 LAB — OPHTHALMOLOGY REPORT-SCANNED

## 2024-12-24 ENCOUNTER — Encounter (HOSPITAL_BASED_OUTPATIENT_CLINIC_OR_DEPARTMENT_OTHER): Payer: Self-pay | Admitting: Cardiovascular Disease

## 2025-01-02 ENCOUNTER — Encounter: Payer: Self-pay | Admitting: Nurse Practitioner

## 2025-01-03 ENCOUNTER — Encounter: Payer: Self-pay | Admitting: Nurse Practitioner

## 2025-01-03 ENCOUNTER — Other Ambulatory Visit: Payer: Self-pay | Admitting: Nurse Practitioner

## 2025-01-03 DIAGNOSIS — R058 Other specified cough: Secondary | ICD-10-CM

## 2025-01-03 MED ORDER — HYDROCODONE BIT-HOMATROP MBR 5-1.5 MG/5ML PO SOLN: 5.0000 mL | Freq: Four times a day (QID) | ORAL | 0 refills | Status: AC | PRN

## 2025-01-21 ENCOUNTER — Other Ambulatory Visit: Payer: Self-pay | Admitting: Nurse Practitioner

## 2025-01-21 DIAGNOSIS — E119 Type 2 diabetes mellitus without complications: Secondary | ICD-10-CM

## 2025-04-01 ENCOUNTER — Ambulatory Visit: Admitting: Nurse Practitioner

## 2025-04-19 ENCOUNTER — Ambulatory Visit (HOSPITAL_BASED_OUTPATIENT_CLINIC_OR_DEPARTMENT_OTHER): Admitting: Family

## 2025-06-14 ENCOUNTER — Ambulatory Visit: Admitting: Podiatry

## 2025-11-21 ENCOUNTER — Encounter: Admitting: Nurse Practitioner

## 2025-11-27 ENCOUNTER — Ambulatory Visit
